# Patient Record
Sex: Male | Born: 1950 | Race: Black or African American | Hispanic: No | Smoking: Current every day smoker
Health system: Southern US, Community
[De-identification: ages and names within clinical notes are randomized; demographics above are authoritative.]

## PROBLEM LIST (undated history)

## (undated) DIAGNOSIS — K859 Acute pancreatitis without necrosis or infection, unspecified: Secondary | ICD-10-CM

## (undated) DIAGNOSIS — I10 Essential (primary) hypertension: Secondary | ICD-10-CM

---

## 2013-05-17 DIAGNOSIS — F17200 Nicotine dependence, unspecified, uncomplicated: Secondary | ICD-10-CM | POA: Insufficient documentation

## 2013-12-15 DIAGNOSIS — Z91199 Patient's noncompliance with other medical treatment and regimen due to unspecified reason: Secondary | ICD-10-CM | POA: Insufficient documentation

## 2013-12-15 DIAGNOSIS — I10 Essential (primary) hypertension: Secondary | ICD-10-CM | POA: Insufficient documentation

## 2014-03-09 DIAGNOSIS — S335XXA Sprain of ligaments of lumbar spine, initial encounter: Secondary | ICD-10-CM | POA: Insufficient documentation

## 2015-05-16 DIAGNOSIS — H1031 Unspecified acute conjunctivitis, right eye: Secondary | ICD-10-CM | POA: Insufficient documentation

## 2016-09-20 ENCOUNTER — Emergency Department (HOSPITAL_BASED_OUTPATIENT_CLINIC_OR_DEPARTMENT_OTHER): Payer: Managed Care, Other (non HMO)

## 2016-09-20 ENCOUNTER — Emergency Department (HOSPITAL_BASED_OUTPATIENT_CLINIC_OR_DEPARTMENT_OTHER)
Admission: EM | Admit: 2016-09-20 | Discharge: 2016-09-21 | Disposition: A | Discharge status: Home / Self Care | Payer: Managed Care, Other (non HMO) | Attending: Emergency Medicine | Admitting: Emergency Medicine

## 2016-09-20 ENCOUNTER — Encounter (HOSPITAL_BASED_OUTPATIENT_CLINIC_OR_DEPARTMENT_OTHER): Payer: Self-pay | Admitting: Emergency Medicine

## 2016-09-20 DIAGNOSIS — F17200 Nicotine dependence, unspecified, uncomplicated: Secondary | ICD-10-CM | POA: Diagnosis not present

## 2016-09-20 DIAGNOSIS — R51 Headache: Secondary | ICD-10-CM | POA: Diagnosis not present

## 2016-09-20 DIAGNOSIS — R0602 Shortness of breath: Secondary | ICD-10-CM | POA: Diagnosis present

## 2016-09-20 DIAGNOSIS — D649 Anemia, unspecified: Secondary | ICD-10-CM | POA: Insufficient documentation

## 2016-09-20 DIAGNOSIS — R195 Other fecal abnormalities: Secondary | ICD-10-CM

## 2016-09-20 DIAGNOSIS — I10 Essential (primary) hypertension: Secondary | ICD-10-CM | POA: Insufficient documentation

## 2016-09-20 DIAGNOSIS — R0902 Hypoxemia: Secondary | ICD-10-CM | POA: Insufficient documentation

## 2016-09-20 HISTORY — DX: Essential (primary) hypertension: I10

## 2016-09-20 NOTE — ED Notes (Signed)
Pt c/o generalized fatigue for past "some time" and that he sleeps "more than he should".  Pt c/o headache that gets worse with laying flat, and DOE for past many weeks as well.

## 2016-09-20 NOTE — ED Provider Notes (Signed)
[facility] Note   CSN: [id number] Arrival date & time: 09/20/16  2306   By signing my name below, I, Jared Duran, attest that this documentation has been prepared under the direction and in the presence of Jared Ohara, MD . Electronically Signed: Nelwyn Duran, Scribe. 09/20/2016. 11:49 PM.  History   Chief Complaint Chief Complaint  Patient presents with  . Hypertension  . Shortness of Breath   The history is provided by the patient. No language interpreter was used.   HPI Comments:  Jared Duran is a 66 y.o. male with pmhx of HTN who presents to the Emergency Department complaining of gradual-onset, intermittent headache onset over the past few weeks. Pt describes his symptoms as an aching headache exacerbated by laying down. No alleviating factors indicated. Pt reports associated cough and SOB. He has not taken anything at home for his symptoms. Pt denies any chest pain, abdominal pain, nausea, vomiting, diarrhea, LOC, slurred speech, vision loss, or weakness. He is a current smoker with no recent shx. Pt has no hx of blood clots.  Past Medical History:  Diagnosis Date  . Hypertension     There are no active problems to display for this patient.   History reviewed. No pertinent surgical history.    Home Medications    Prior to Admission medications   Medication Sig Start Date End Date Taking? Authorizing Provider  predniSONE (DELTASONE) 20 MG tablet 2 tabs po daily x 4 days 09/21/16   Jared Ohara, MD    Family History History reviewed. No pertinent family history.  Social History Social History  Substance Use Topics  . Smoking status: Current Every Day Smoker  . Smokeless tobacco: Never Used  . Alcohol use Yes     Allergies   Patient has no known allergies.   Review of Systems Review of Systems  Eyes: Negative for visual disturbance.  Respiratory: Positive for cough and shortness of breath.   Cardiovascular: Negative for  chest pain.  Gastrointestinal: Negative for abdominal pain, diarrhea, nausea and vomiting.  Neurological: Positive for headaches. Negative for syncope, speech difficulty and weakness.  All other systems reviewed and are negative.    Physical Exam Updated Vital Signs BP 137/84 (BP Location: Right Arm)   Pulse 80   Temp 98.4 F (36.9 C) (Oral)   Resp 20   Ht 5\' 9"  (1.753 m)   Wt 160 lb (72.6 kg)   SpO2 91%   BMI 23.63 kg/m   Physical Exam  Constitutional: He is oriented to person, place, and time. He appears well-developed and well-nourished.  HENT:  Head: Normocephalic.  Eyes: EOM are normal. Pupils are equal, round, and reactive to light.  Pupils equal bilaterally.   Neck: Normal range of motion.  Cardiovascular: Normal rate and regular rhythm.   Pulmonary/Chest: Effort normal and breath sounds normal. No respiratory distress.  Abdominal: Soft. He exhibits no distension. There is no tenderness.  Musculoskeletal: Normal range of motion.  Neurological: He is alert and oriented to person, place, and time.  No obvious facial droop. Finger-nose intact. Grossly 5+ strength in bilateral upper extremities. No temple tenderness bilaterally.  Psychiatric: He has a normal mood and affect.  Nursing note and vitals reviewed.    ED Treatments / Results  DIAGNOSTIC STUDIES:  Oxygen Saturation is 96% on [place], adequate by my interpretation.    Pt observed in room with Oxygen Saturation of 93% off oxygen with good waveform.   COORDINATION OF CARE:  12:28 AM Discussed  treatment plan with pt at bedside which includes oxygen, CT head, and blood work and pt agreed to plan.  Labs (all labs ordered are listed, but only abnormal results are displayed) Labs Reviewed  CBC - Abnormal; Notable for the following:       Result Value   RBC 2.53 (*)    Hemoglobin 9.1 (*)    HCT 26.9 (*)    MCV 106.3 (*)    MCH 36.0 (*)    Platelets 135 (*)    All other components within normal limits  BASIC  METABOLIC PANEL - Abnormal; Notable for the following:    Potassium 3.4 (*)    All other components within normal limits  OCCULT BLOOD X 1 CARD TO LAB, STOOL - Abnormal; Notable for the following:    Fecal Occult Bld POSITIVE (*)    All other components within normal limits  BRAIN NATRIURETIC PEPTIDE  D-DIMER, QUANTITATIVE (NOT AT [facility])  TROPONIN I    EKG  EKG Interpretation  Date/Time:  Sunday September 21 2016 00:41:30 EST Ventricular Rate:  70 PR Interval:    QRS Duration: 113 QT Interval:  419 QTC Calculation: 453 R Axis:   -64 Text Interpretation:  Sinus rhythm Left anterior fascicular block Anterior infarct, old Confirmed by Jared Laiche MD, Jared Duran [phone]([id number]) on 09/21/2016 1:06:48 AM       Radiology Dg Chest 2 View  Result Date: 09/20/2016 CLINICAL DATA:  Generalized fatigue and worsening headache for 2 weeks, cough, hypoxia, smoker, hypertension EXAM: CHEST  2 VIEW COMPARISON:  05/17/2013 FINDINGS: Normal heart size and pulmonary vascularity. Tortuous thoracic aorta. Hyperinflated lungs with minimal central peribronchial thickening consistent with COPD. No acute infiltrate, pleural effusion, or pneumothorax. Scattered endplate spur formation thoracic spine multiple levels. Old healed fracture of the posterior RIGHT tenth rib. IMPRESSION: COPD changes. No acute abnormalities. Electronically Signed   By: Jared SouthwardMark  Duran M.D.   On: 09/20/2016 23:58   Ct Head Wo Contrast  Result Date: 09/21/2016 CLINICAL DATA:  Intermittent headache for a few weeks. Cough and shortness of breath. History of hypertension. EXAM: CT HEAD WITHOUT CONTRAST TECHNIQUE: Contiguous axial images were obtained from the base of the skull through the vertex without intravenous contrast. COMPARISON:  None. FINDINGS: Brain: No evidence of acute infarction, hemorrhage, hydrocephalus, extra-axial collection or mass lesion/mass effect. Vascular: Vascular calcifications. Skull: Normal. Negative for fracture or focal lesion.  Sinuses/Orbits: Mild mucosal thickening and small retention cysts in the left maxillary antrum. Mastoid air cells are not opacified. Other: None. IMPRESSION: No acute intracranial abnormalities. Electronically Signed   By: Burman NievesWilliam  Stevens M.D.   On: 09/21/2016 01:14    Procedures Procedures (including critical care time)  Medications Ordered in ED Medications  morphine 4 MG/ML injection 4 mg (4 mg Intravenous Refused 09/21/16 0107)  acetaminophen (TYLENOL) tablet 1,000 mg (1,000 mg Oral Given 09/21/16 0056)  albuterol (PROVENTIL HFA;VENTOLIN HFA) 108 (90 Base) MCG/ACT inhaler 2 puff (2 puffs Inhalation Given 09/21/16 0106)  dexamethasone (DECADRON) injection 10 mg (10 mg Intravenous Given 09/21/16 0057)     Initial Impression / Assessment and Plan / ED Course  I have reviewed the triage vital signs and the nursing notes.  Pertinent labs & imaging results that were available during my care of the patient were reviewed by me and considered in my medical decision making (see chart for details).  Clinical Course    Patient presents with mild soreness of breath and cough for months. Long-term smoking history and chest x-ray along with clinical  concern for COPD undiagnosed. Discussed follow-up with primary Dr. for this. Patient's oxygen varying between 87 and 93%. On reassessment without oxygen patient 90%. Discussed he needs very close follow-up for this as patient refuses any admission or transfer. Patient has anemia unsure baseline. Patient has had polyps before. Stressed follow-up with gastroenterology for repeat colonoscopy and further workup. Patient understands he has a lot of medical issues going on right now and says a follow-up at [facility] in his specialist. Patient no chest pain. Low risk blood clot d-dimer negative. Intermittent gradual onset headaches CT scan unremarkable. No signs of temporal arteritis clinically. No fever or neck stiffness. Neuro intact.  Results and differential  diagnosis were discussed with the patient/parent/guardian. Xrays were independently reviewed by myself.  Close follow up outpatient was discussed, comfortable with the plan.   Medications  morphine 4 MG/ML injection 4 mg (4 mg Intravenous Refused 09/21/16 0107)  acetaminophen (TYLENOL) tablet 1,000 mg (1,000 mg Oral Given 09/21/16 0056)  albuterol (PROVENTIL HFA;VENTOLIN HFA) 108 (90 Base) MCG/ACT inhaler 2 puff (2 puffs Inhalation Given 09/21/16 0106)  dexamethasone (DECADRON) injection 10 mg (10 mg Intravenous Given 09/21/16 0057)    Vitals:   09/21/16 0045 09/21/16 0100 09/21/16 0106 09/21/16 0201  BP: (!) 148/102 145/98  137/84  Pulse: 71 75  80  Resp: 21 21  20   Temp:    98.4 F (36.9 C)  TempSrc:    Oral  SpO2: 97% 96% 96% 91%  Weight:      Height:        Final diagnoses:  Occult blood in stools  Hypoxia  Anemia, unspecified type  Shortness of breath     Final Clinical Impressions(s) / ED Diagnoses   Final diagnoses:  Occult blood in stools  Hypoxia  Anemia, unspecified type  Shortness of breath    New Prescriptions New Prescriptions   PREDNISONE (DELTASONE) 20 MG TABLET    2 tabs po daily x 4 days      Jared Ohara, MD 09/21/16 [phone]

## 2016-09-20 NOTE — ED Triage Notes (Signed)
Pt states he has been having HA for a few weeks and thought it was his blood pressure, never took at home

## 2016-09-21 ENCOUNTER — Emergency Department (HOSPITAL_BASED_OUTPATIENT_CLINIC_OR_DEPARTMENT_OTHER): Payer: Managed Care, Other (non HMO)

## 2016-09-21 LAB — BASIC METABOLIC PANEL
Anion gap: 10 (ref 5–15)
BUN: 8 mg/dL (ref 6–20)
CALCIUM: 9 mg/dL (ref 8.9–10.3)
CO2: 27 mmol/L (ref 22–32)
Chloride: 107 mmol/L (ref 101–111)
Creatinine, Ser: 1.07 mg/dL (ref 0.61–1.24)
GFR calc Af Amer: >60 mL/min (ref >60)
GFR calc non Af Amer: >60 mL/min (ref >60)
Glucose, Bld: 86 mg/dL (ref 65–99)
Potassium: 3.4 mmol/L — ABNORMAL LOW (ref 3.5–5.1)
Sodium: 144 mmol/L (ref 135–145)

## 2016-09-21 LAB — TROPONIN I: Troponin I: <0.03 ng/mL (ref <0.03)

## 2016-09-21 LAB — D-DIMER, QUANTITATIVE: D-Dimer, Quant: 0.34 ug/mL-FEU (ref 0.00–0.50)

## 2016-09-21 LAB — CBC
HEMATOCRIT: 26.9 % — AB (ref 39.0–52.0)
Hemoglobin: 9.1 g/dL — ABNORMAL LOW (ref 13.0–17.0)
MCH: 36 pg — AB (ref 26.0–34.0)
MCHC: 33.8 g/dL (ref 30.0–36.0)
MCV: 106.3 fL — AB (ref 78.0–100.0)
Platelets: 135 10*3/uL — ABNORMAL LOW (ref 150–400)
RBC: 2.53 MIL/uL — ABNORMAL LOW (ref 4.22–5.81)
RDW: 12.1 % (ref 11.5–15.5)
WBC: 7.6 10*3/uL (ref 4.0–10.5)

## 2016-09-21 LAB — BRAIN NATRIURETIC PEPTIDE: B Natriuretic Peptide: 17.9 pg/mL (ref 0.0–100.0)

## 2016-09-21 LAB — OCCULT BLOOD X 1 CARD TO LAB, STOOL: Fecal Occult Bld: POSITIVE — AB

## 2016-09-21 MED ORDER — ACETAMINOPHEN 500 MG PO TABS
1000.0000 mg | ORAL_TABLET | Freq: Once | ORAL | Status: AC
  Administered 2016-09-21: 1000 mg via ORAL
  Filled 2016-09-21: qty 2

## 2016-09-21 MED ORDER — ALBUTEROL SULFATE HFA 108 (90 BASE) MCG/ACT IN AERS
2.0000 | INHALATION_SPRAY | Freq: Once | RESPIRATORY_TRACT | Status: AC
  Administered 2016-09-21: 2 via RESPIRATORY_TRACT
  Filled 2016-09-21: qty 6.7

## 2016-09-21 MED ORDER — MORPHINE SULFATE (PF) 4 MG/ML IV SOLN
4.0000 mg | Freq: Once | INTRAVENOUS | Status: DC
  Filled 2016-09-21: qty 1

## 2016-09-21 MED ORDER — PREDNISONE 20 MG PO TABS: ORAL_TABLET | ORAL | 0 refills | Status: DC

## 2016-09-21 MED ORDER — DEXAMETHASONE SODIUM PHOSPHATE 10 MG/ML IJ SOLN
10.0000 mg | Freq: Once | INTRAMUSCULAR | Status: AC
  Administered 2016-09-21: 10 mg via INTRAVENOUS
  Filled 2016-09-21: qty 1

## 2016-09-21 NOTE — ED Notes (Signed)
Pt placed on 2L o2 due to low O2 sats.

## 2016-09-21 NOTE — ED Notes (Signed)
ED Provider at bedside. 

## 2016-09-21 NOTE — Discharge Instructions (Signed)
See [facility] for hypoxia and further workup.  Discuss repeat colonoscopy with your GI doctor.  If you were given medicines take as directed.  If you are on coumadin or contraceptives realize their levels and effectiveness is altered by many different medicines.  If you have any reaction (rash, tongues swelling, other) to the medicines stop taking and see a physician.    If your blood pressure was elevated in the ER make sure you follow up for management with a primary doctor or return for chest pain, shortness of breath or stroke symptoms.  Please follow up as directed and return to the ER or see a physician for new or worsening symptoms.  Thank you. Vitals:   09/21/16 0045 09/21/16 0100 09/21/16 0106 09/21/16 0201  BP: (!) 148/102 145/98  137/84  Pulse: 71 75  80  Resp: 21 21  20   Temp:    98.4 F (36.9 C)  TempSrc:    Oral  SpO2: 97% 96% 96% 91%  Weight:      Height:

## 2016-09-21 NOTE — ED Notes (Signed)
MD made aware that pt refused morphine admin.

## 2016-09-21 NOTE — ED Notes (Addendum)
Placed pt back on O2@2L  due sats at 89%. Pt sats are now 96% on O2@2L .

## 2016-09-21 NOTE — ED Notes (Signed)
Pt and SO given d/c instructions as per chart. Rx x 1. Verbalizes understanding. No questions. 

## 2017-05-18 DIAGNOSIS — I10 Essential (primary) hypertension: Secondary | ICD-10-CM | POA: Insufficient documentation

## 2017-06-24 DIAGNOSIS — R7989 Other specified abnormal findings of blood chemistry: Secondary | ICD-10-CM | POA: Insufficient documentation

## 2017-10-27 DIAGNOSIS — B0229 Other postherpetic nervous system involvement: Secondary | ICD-10-CM | POA: Insufficient documentation

## 2018-10-14 ENCOUNTER — Emergency Department (HOSPITAL_BASED_OUTPATIENT_CLINIC_OR_DEPARTMENT_OTHER)
Admission: EM | Admit: 2018-10-14 | Discharge: 2018-10-14 | Disposition: A | Discharge status: Home / Self Care | Payer: Managed Care, Other (non HMO) | Attending: Emergency Medicine | Admitting: Emergency Medicine

## 2018-10-14 ENCOUNTER — Other Ambulatory Visit: Payer: Self-pay

## 2018-10-14 ENCOUNTER — Emergency Department (HOSPITAL_BASED_OUTPATIENT_CLINIC_OR_DEPARTMENT_OTHER): Payer: Managed Care, Other (non HMO)

## 2018-10-14 ENCOUNTER — Encounter (HOSPITAL_BASED_OUTPATIENT_CLINIC_OR_DEPARTMENT_OTHER): Payer: Self-pay | Admitting: *Deleted

## 2018-10-14 DIAGNOSIS — R0902 Hypoxemia: Secondary | ICD-10-CM | POA: Diagnosis not present

## 2018-10-14 DIAGNOSIS — R7981 Abnormal blood-gas level: Secondary | ICD-10-CM

## 2018-10-14 DIAGNOSIS — F17200 Nicotine dependence, unspecified, uncomplicated: Secondary | ICD-10-CM | POA: Insufficient documentation

## 2018-10-14 DIAGNOSIS — R3121 Asymptomatic microscopic hematuria: Secondary | ICD-10-CM

## 2018-10-14 DIAGNOSIS — I10 Essential (primary) hypertension: Secondary | ICD-10-CM | POA: Diagnosis not present

## 2018-10-14 DIAGNOSIS — R112 Nausea with vomiting, unspecified: Secondary | ICD-10-CM

## 2018-10-14 LAB — CBC WITH DIFFERENTIAL/PLATELET
ABS IMMATURE GRANULOCYTES: 0.03 10*3/uL (ref 0.00–0.07)
BASOS PCT: 1 %
Basophils Absolute: 0 10*3/uL (ref 0.0–0.1)
EOS ABS: 0 10*3/uL (ref 0.0–0.5)
Eosinophils Relative: 0 %
HEMATOCRIT: 46.4 % (ref 39.0–52.0)
Hemoglobin: 15.6 g/dL (ref 13.0–17.0)
Immature Granulocytes: 0 %
LYMPHS ABS: 1.5 10*3/uL (ref 0.7–4.0)
Lymphocytes Relative: 18 %
MCH: 35 pg — AB (ref 26.0–34.0)
MCHC: 33.6 g/dL (ref 30.0–36.0)
MCV: 104 fL — AB (ref 80.0–100.0)
MONO ABS: 0.8 10*3/uL (ref 0.1–1.0)
MONOS PCT: 9 %
Neutro Abs: 6.2 10*3/uL (ref 1.7–7.7)
Neutrophils Relative %: 72 %
PLATELETS: 176 10*3/uL (ref 150–400)
RBC: 4.46 MIL/uL (ref 4.22–5.81)
RDW: 12.7 % (ref 11.5–15.5)
WBC: 8.6 10*3/uL (ref 4.0–10.5)
nRBC: 0 % (ref 0.0–0.2)

## 2018-10-14 LAB — URINALYSIS, ROUTINE W REFLEX MICROSCOPIC
Glucose, UA: NEGATIVE mg/dL
Leukocytes, UA: NEGATIVE
Nitrite: NEGATIVE
Specific Gravity, Urine: 1.025 (ref 1.005–1.030)
pH: 7 (ref 5.0–8.0)

## 2018-10-14 LAB — COMPREHENSIVE METABOLIC PANEL
ALT: 16 U/L (ref 0–44)
AST: 33 U/L (ref 15–41)
Albumin: 4 g/dL (ref 3.5–5.0)
Alkaline Phosphatase: 30 U/L — ABNORMAL LOW (ref 38–126)
Anion gap: 19 — ABNORMAL HIGH (ref 5–15)
BILIRUBIN TOTAL: 1.4 mg/dL — AB (ref 0.3–1.2)
BUN: 10 mg/dL (ref 8–23)
CALCIUM: 9.1 mg/dL (ref 8.9–10.3)
CHLORIDE: 87 mmol/L — AB (ref 98–111)
CO2: 29 mmol/L (ref 22–32)
CREATININE: 0.8 mg/dL (ref 0.61–1.24)
Glucose, Bld: 96 mg/dL (ref 70–99)
Potassium: 3.4 mmol/L — ABNORMAL LOW (ref 3.5–5.1)
Sodium: 135 mmol/L (ref 135–145)
TOTAL PROTEIN: 8 g/dL (ref 6.5–8.1)

## 2018-10-14 LAB — URINALYSIS, MICROSCOPIC (REFLEX): RBC / HPF: >50 RBC/hpf (ref 0–5)

## 2018-10-14 LAB — LIPASE, BLOOD: LIPASE: 33 U/L (ref 11–51)

## 2018-10-14 MED ORDER — LOSARTAN POTASSIUM 50 MG PO TABS: 50.0000 mg | ORAL_TABLET | Freq: Every day | ORAL | 0 refills | Status: DC

## 2018-10-14 MED ORDER — SODIUM CHLORIDE 0.9 % IV BOLUS
500.0000 mL | Freq: Once | INTRAVENOUS | Status: AC
  Administered 2018-10-14: 500 mL via INTRAVENOUS

## 2018-10-14 MED ORDER — ONDANSETRON 4 MG PO TBDP: 4.0000 mg | ORAL_TABLET | Freq: Three times a day (TID) | ORAL | 0 refills | Status: DC | PRN

## 2018-10-14 MED ORDER — IPRATROPIUM BROMIDE 0.02 % IN SOLN
0.5000 mg | Freq: Once | RESPIRATORY_TRACT | Status: AC
  Administered 2018-10-14: 0.5 mg via RESPIRATORY_TRACT
  Filled 2018-10-14: qty 2.5

## 2018-10-14 MED ORDER — NIFEDIPINE 20 MG PO CAPS: 20.0000 mg | ORAL_CAPSULE | Freq: Three times a day (TID) | ORAL | 0 refills | Status: DC

## 2018-10-14 MED ORDER — ALBUTEROL SULFATE (2.5 MG/3ML) 0.083% IN NEBU
5.0000 mg | INHALATION_SOLUTION | Freq: Once | RESPIRATORY_TRACT | Status: AC
  Administered 2018-10-14: 5 mg via RESPIRATORY_TRACT
  Filled 2018-10-14: qty 6

## 2018-10-14 MED ORDER — ONDANSETRON HCL 4 MG/2ML IJ SOLN
4.0000 mg | Freq: Once | INTRAMUSCULAR | Status: AC
Start: 2018-10-14 — End: 2018-10-14
  Administered 2018-10-14: 4 mg via INTRAVENOUS
  Filled 2018-10-14: qty 2

## 2018-10-14 MED ORDER — ALBUTEROL SULFATE HFA 108 (90 BASE) MCG/ACT IN AERS
2.0000 | INHALATION_SPRAY | RESPIRATORY_TRACT | Status: DC | PRN
  Administered 2018-10-14: 2 via RESPIRATORY_TRACT
  Filled 2018-10-14: qty 6.7

## 2018-10-14 NOTE — ED Notes (Signed)
RT to bedside

## 2018-10-14 NOTE — ED Provider Notes (Signed)
[facility] EMERGENCY DEPARTMENT Provider Note   CSN: [id number] Arrival date & time: 10/14/18  1155     History   Chief Complaint Chief Complaint  Patient presents with  . Emesis    HPI Jared Duran is a 68 y.o. male.  Patient with history of hypertension, chronic alcohol use, no past abdominal surgeries presents to the emergency department with 2 days of nausea and vomiting.  Patient has had some mild abdominal soreness but no focal tenderness.  No diarrhea.  No blood in his stool or vomit.  He states that he has shortness of breath and cough over the past several days as well.  He has had subjective fevers and chills.  No treatments prior to arrival.  Patient states that he has not drank alcohol in 2 days.  He states that he has been out of his blood pressure medications for about a month.  He called his doctor and was told to come to the emergency department.  Onset of symptoms acute.  Course is constant.  No known sick contacts. Patient denies risk factors for pulmonary embolism including: unilateral leg swelling, history of DVT/PE/other blood clots, use of exogenous hormones, recent immobilizations, recent surgery, recent travel (>4hr segment), malignancy, hemoptysis.       Past Medical History:  Diagnosis Date  . Hypertension     There are no active problems to display for this patient.   History reviewed. No pertinent surgical history.      Home Medications    Prior to Admission medications   Medication Sig Start Date End Date Taking? Authorizing Provider  predniSONE (DELTASONE) 20 MG tablet 2 tabs po daily x 4 days 09/21/16   Blane Ohara, MD    Family History No family history on file.  Social History Social History   Tobacco Use  . Smoking status: Current Every Day Smoker  . Smokeless tobacco: Never Used  Substance Use Topics  . Alcohol use: Yes  . Drug use: No     Allergies   Patient has no known allergies.   Review of  Systems Review of Systems  Constitutional: Positive for chills and fever.  HENT: Negative for rhinorrhea and sore throat.   Eyes: Negative for redness.  Respiratory: Positive for shortness of breath. Negative for cough.   Cardiovascular: Negative for chest pain.  Gastrointestinal: Positive for nausea and vomiting. Negative for abdominal pain and diarrhea.  Genitourinary: Negative for dysuria.  Musculoskeletal: Negative for myalgias.  Skin: Negative for rash.  Neurological: Negative for headaches.     Physical Exam Updated Vital Signs BP (!) 174/128 Comment: ran out of BP meds a month ago.  Pulse (!) 112   Temp 98.1 F (36.7 C) (Oral)   Resp 20   Ht 5\' 9"  (1.753 m)   Wt 68 kg   SpO2 94%   BMI 22.15 kg/m   Physical Exam Vitals signs and nursing note reviewed.  Constitutional:      Appearance: He is well-developed.  HENT:     Head: Normocephalic and atraumatic.  Eyes:     General:        Right eye: No discharge.        Left eye: No discharge.     Conjunctiva/sclera: Conjunctivae normal.  Neck:     Musculoskeletal: Normal range of motion and neck supple.  Cardiovascular:     Rate and Rhythm: Regular rhythm. Tachycardia present.     Heart sounds: Normal heart sounds. No murmur.  Pulmonary:  Effort: Pulmonary effort is normal. No respiratory distress.     Breath sounds: Normal breath sounds. No wheezing, rhonchi or rales.  Chest:     Chest wall: No tenderness.  Abdominal:     Palpations: Abdomen is soft.     Tenderness: There is no abdominal tenderness. There is no guarding or rebound.  Skin:    General: Skin is warm and dry.  Neurological:     Mental Status: He is alert.      ED Treatments / Results  Labs (all labs ordered are listed, but only abnormal results are displayed) Labs Reviewed  CBC WITH DIFFERENTIAL/PLATELET - Abnormal; Notable for the following components:      Result Value   MCV 104.0 (*)    MCH 35.0 (*)    All other components within  normal limits  COMPREHENSIVE METABOLIC PANEL - Abnormal; Notable for the following components:   Potassium 3.4 (*)    Chloride 87 (*)    Alkaline Phosphatase 30 (*)    Total Bilirubin 1.4 (*)    Anion gap 19 (*)    All other components within normal limits  URINALYSIS, ROUTINE W REFLEX MICROSCOPIC - Abnormal; Notable for the following components:   APPearance CLOUDY (*)    Hgb urine dipstick LARGE (*)    Bilirubin Urine SMALL (*)    Ketones, ur >80 (*)    Protein, ur >300 (*)    All other components within normal limits  URINALYSIS, MICROSCOPIC (REFLEX) - Abnormal; Notable for the following components:   Bacteria, UA RARE (*)    All other components within normal limits  LIPASE, BLOOD    EKG EKG Interpretation  Date/Time:  Thursday October 14 2018 12:30:41 EST Ventricular Rate:  94 PR Interval:    QRS Duration: 111 QT Interval:  380 QTC Calculation: 476 R Axis:   -69 Text Interpretation:  Sinus rhythm Left anterior fascicular block Anterior infarct, old No STEMI.  Confirmed by Alona BeneLong, Cheo Selvey [phone]([id number]) on 10/14/2018 12:34:26 PM   Radiology Dg Abd Acute 2+v W 1v Chest  Result Date: 10/14/2018 CLINICAL DATA:  Shortness of breath.  Nausea and vomiting. EXAM: DG ABDOMEN ACUTE W/ 1V CHEST COMPARISON:  Chest radiograph September 20, 2016; CT abdomen and pelvis February 17, 2017 FINDINGS: PA chest: No edema or consolidation. Heart size and pulmonary vascular normal. Aorta is tortuous but stable. No adenopathy. There is degenerative change in the thoracic spine. Supine and upright abdomen: There is moderate stool in the colon. There is no bowel dilatation or air-fluid level to suggest bowel obstruction. No free air. There are phleboliths in the pelvis. IMPRESSION: No evident bowel obstruction or free air. No lung edema or consolidation. Stable cardiac silhouette. Electronically Signed   By: Bretta BangWilliam  Woodruff III M.D.   On: 10/14/2018 12:27    Procedures Procedures (including critical care  time)  Medications Ordered in ED Medications  albuterol (PROVENTIL HFA;VENTOLIN HFA) 108 (90 Base) MCG/ACT inhaler 2 puff (2 puffs Inhalation Given 10/14/18 1410)  sodium chloride 0.9 % bolus 500 mL ( Intravenous Stopped 10/14/18 1331)  ondansetron (ZOFRAN) injection 4 mg (4 mg Intravenous Given 10/14/18 1256)  sodium chloride 0.9 % bolus 500 mL (0 mLs Intravenous Stopped 10/14/18 1407)  albuterol (PROVENTIL) (2.5 MG/3ML) 0.083% nebulizer solution 5 mg (5 mg Nebulization Given 10/14/18 1330)  ipratropium (ATROVENT) nebulizer solution 0.5 mg (0.5 mg Nebulization Given 10/14/18 1330)     Initial Impression / Assessment and Plan / ED Course  I have reviewed the triage  vital signs and the nursing notes.  Pertinent labs & imaging results that were available during my care of the patient were reviewed by me and considered in my medical decision making (see chart for details).     Patient seen and examined. Work-up initiated. Medications ordered.   Vital signs reviewed and are as follows: BP (!) 174/128 Comment: ran out of BP meds a month ago.  Pulse (!) 112   Temp 98.1 F (36.7 C) (Oral)   Resp 20   Ht 5\' 9"  (1.753 m)   Wt 68 kg   SpO2 94%   BMI 22.15 kg/m   1:15 PM Pt discussed with and seen by Dr. Jacqulyn Bath.   N/V are improving with treatment, given additional fluid bolus. Will PO trial. Abd remains benign.  Abdominal films without signs of obstruction.  Hematuria: noted incidentally, no infection.  Patient will need followed up for this.  CT in 2018 showed a small ?renal cyst.  Cough/shortness of breath: Patient has had low oxygen saturations at times here, he seems to be at his baseline and is stable.  States shortness of breath, however he continues to perform daily activities including go to work.  Will give breathing treatment here.  Will likely need continued treatment for COPD for home.  EKG without ischemic findings.  Doubt PE given patient presentation.  HTN: restart meds, will need PCP  follow-up.   2:11 PM Patient stable. I walked with him to the restroom. No distress or SOB.  On recheck, lungs are clear.   We discussed the above problems and need for close PCP follow-up.  Patient will be discharged home with prescription for nifedipine and losartan.  Also given Zofran.  Discussed brat diet and clear liquids for the next 24 hours. The patient was urged to return to the Emergency Department immediately with worsening of current symptoms, worsening abdominal pain, persistent vomiting, blood noted in stools, fever, or any other concerns. The patient verbalized understanding.   We discussed the above problems and need for close PCP follow-up. I have written these down and asked him to bring paperwork to PCP.   Discussed that if he develops worsening cough, shortness of breath, lightheadedness, chest pain, that he should return to the emergency department for further evaluation.  Stressed the importance of outpatient follow-up.  He is given a albuterol inhaler for home.  Discussed use of inhaler for wheezing, chest tightness, or cough.   Final Clinical Impressions(s) / ED Diagnoses   Final diagnoses:  Non-intractable vomiting with nausea, unspecified vomiting type  Essential hypertension  Asymptomatic microscopic hematuria  Low oxygen saturation   Patient with multiple chronic medical problems.  His main complaint today is nausea and vomiting without significant abdominal pain, diarrhea, or fever.  Patient with abdominal pain. Vitals are stable, no fever. Labs reassuring. Macrocytic anemia likely 2/2 Etoh use. Imaging without signs of pneumonia or bowel obstruction. No signs of dehydration, patient is tolerating PO's. Lungs are clear and no signs suggestive of PNA. Low concern for appendicitis, cholecystitis, pancreatitis, ruptured viscus, UTI, kidney stone, aortic dissection, aortic aneurysm or other emergent abdominal etiology. Supportive therapy indicated with return if symptoms  worsen.   Patient also found to have low oxygen saturation, microscopic hematuria, and elevated blood pressure today.  Treatment and plan as above.   ED Discharge Orders         Ordered    losartan (COZAAR) 50 MG tablet  Daily     10/14/18 1402  NIFEdipine (PROCARDIA) 20 MG capsule  3 times daily     10/14/18 1402    ondansetron (ZOFRAN ODT) 4 MG disintegrating tablet  Every 8 hours PRN     10/14/18 1406           Renne CriglerGeiple, Macklen Wilhoite, PA-C 10/14/18 1416    Maia PlanLong, Aritzel Krusemark G, MD 10/14/18 1425

## 2018-10-14 NOTE — ED Notes (Signed)
Pt returned from Xray and providing a urine sample. EKG will be completed after Pt is done with restroom.

## 2018-10-14 NOTE — ED Triage Notes (Signed)
Vomiting x 2 days. No pain.

## 2018-10-14 NOTE — ED Notes (Signed)
Pt given water for PO challenge 

## 2018-10-14 NOTE — ED Notes (Signed)
Patient transported to X-ray 

## 2018-10-14 NOTE — Discharge Instructions (Signed)
Please read and follow all provided instructions.  Your diagnoses today include:  1. Non-intractable vomiting with nausea, unspecified vomiting type   2. Essential hypertension   3. Asymptomatic microscopic hematuria   4. Low oxygen saturation     Tests performed today include:  Blood counts and electrolytes  Blood tests to check liver and kidney function  Blood tests to check pancreas function  Urine test to look for infection and pregnancy (in women)  Chest and abdominal x-ray - no pneumonia or signs of bowel blockage  EKG - no sign of heart attack  Vital signs. See below for your results today.   Medications prescribed:   Zofran (ondansetron) - for nausea and vomiting  Take any prescribed medications only as directed.  Home care instructions:   Follow any educational materials contained in this packet.  Follow-up instructions: Please follow-up with your primary care provider in the next 2 days for further evaluation of your symptoms.    You need to have the following problems addressed by your primary care doctor:   You have blood in your urine today. This needs to be rechecked and evaluated.   You have been restarted on your blood pressure medication today. You will need your blood pressure rechecked after re-starting your medication.   Your oxygen level is low -- likely related to COPD or smoking. This is probably a chronic problem but you need further evaluation for your oxygen level and breathing.   Return instructions:  SEEK IMMEDIATE MEDICAL ATTENTION IF:  The pain does not go away or becomes severe   A temperature above 101F develops   Repeated vomiting occurs (multiple episodes)   The pain becomes localized to portions of the abdomen. The right side could possibly be appendicitis. In an adult, the left lower portion of the abdomen could be colitis or diverticulitis.   Blood is being passed in stools or vomit (bright red or black tarry stools)    You develop chest pain, difficulty breathing, dizziness or fainting, or become confused, poorly responsive, or inconsolable (young children)  If you have any other emergent concerns regarding your health  Additional Information: Abdominal (belly) pain can be caused by many things. Your caregiver performed an examination and possibly ordered blood/urine tests and imaging (CT scan, x-rays, ultrasound). Many cases can be observed and treated at home after initial evaluation in the emergency department. Even though you are being discharged home, abdominal pain can be unpredictable. Therefore, you need a repeated exam if your pain does not resolve, returns, or worsens. Most patients with abdominal pain don't have to be admitted to the hospital or have surgery, but serious problems like appendicitis and gallbladder attacks can start out as nonspecific pain. Many abdominal conditions cannot be diagnosed in one visit, so follow-up evaluations are very important.  Your vital signs today were: BP (!) 188/115    Pulse 81    Temp 98.1 F (36.7 C) (Oral)    Resp (!) 24    Ht 5\' 9"  (1.753 m)    Wt 68 kg    SpO2 100%    BMI 22.15 kg/m  If your blood pressure (bp) was elevated above 135/85 this visit, please have this repeated by your doctor within one month. --------------

## 2018-10-14 NOTE — ED Notes (Signed)
ED Provider at bedside. 

## 2020-04-10 ENCOUNTER — Other Ambulatory Visit: Payer: Self-pay

## 2020-04-10 ENCOUNTER — Encounter (HOSPITAL_BASED_OUTPATIENT_CLINIC_OR_DEPARTMENT_OTHER): Payer: Self-pay

## 2020-04-10 ENCOUNTER — Emergency Department (HOSPITAL_BASED_OUTPATIENT_CLINIC_OR_DEPARTMENT_OTHER): Payer: Medicare Other

## 2020-04-10 ENCOUNTER — Inpatient Hospital Stay (HOSPITAL_BASED_OUTPATIENT_CLINIC_OR_DEPARTMENT_OTHER)
Admission: EM | Admit: 2020-04-10 | Discharge: 2020-04-12 | DRG: 439 | Disposition: A | Discharge status: Home / Self Care | Payer: Medicare Other | Attending: Internal Medicine | Admitting: Internal Medicine

## 2020-04-10 DIAGNOSIS — E860 Dehydration: Secondary | ICD-10-CM | POA: Diagnosis present

## 2020-04-10 DIAGNOSIS — I10 Essential (primary) hypertension: Secondary | ICD-10-CM | POA: Diagnosis present

## 2020-04-10 DIAGNOSIS — K852 Alcohol induced acute pancreatitis without necrosis or infection: Secondary | ICD-10-CM | POA: Diagnosis present

## 2020-04-10 DIAGNOSIS — K859 Acute pancreatitis without necrosis or infection, unspecified: Secondary | ICD-10-CM | POA: Diagnosis not present

## 2020-04-10 DIAGNOSIS — K760 Fatty (change of) liver, not elsewhere classified: Secondary | ICD-10-CM | POA: Diagnosis present

## 2020-04-10 DIAGNOSIS — K7030 Alcoholic cirrhosis of liver without ascites: Secondary | ICD-10-CM

## 2020-04-10 DIAGNOSIS — F1010 Alcohol abuse, uncomplicated: Secondary | ICD-10-CM | POA: Diagnosis present

## 2020-04-10 DIAGNOSIS — Z9119 Patient's noncompliance with other medical treatment and regimen: Secondary | ICD-10-CM | POA: Diagnosis not present

## 2020-04-10 DIAGNOSIS — K700 Alcoholic fatty liver: Secondary | ICD-10-CM

## 2020-04-10 DIAGNOSIS — Z7952 Long term (current) use of systemic steroids: Secondary | ICD-10-CM

## 2020-04-10 DIAGNOSIS — Z79899 Other long term (current) drug therapy: Secondary | ICD-10-CM | POA: Diagnosis not present

## 2020-04-10 DIAGNOSIS — F17210 Nicotine dependence, cigarettes, uncomplicated: Secondary | ICD-10-CM | POA: Diagnosis present

## 2020-04-10 DIAGNOSIS — Z20822 Contact with and (suspected) exposure to covid-19: Secondary | ICD-10-CM | POA: Diagnosis present

## 2020-04-10 DIAGNOSIS — E871 Hypo-osmolality and hyponatremia: Secondary | ICD-10-CM | POA: Diagnosis present

## 2020-04-10 LAB — BASIC METABOLIC PANEL
Anion gap: 21 — ABNORMAL HIGH (ref 5–15)
BUN: 14 mg/dL (ref 8–23)
CO2: 26 mmol/L (ref 22–32)
Calcium: 9.8 mg/dL (ref 8.9–10.3)
Chloride: 87 mmol/L — ABNORMAL LOW (ref 98–111)
Creatinine, Ser: 1.09 mg/dL (ref 0.61–1.24)
GFR calc Af Amer: >60 mL/min (ref >60)
GFR calc non Af Amer: >60 mL/min (ref >60)
Glucose, Bld: 125 mg/dL — ABNORMAL HIGH (ref 70–99)
Potassium: 3.9 mmol/L (ref 3.5–5.1)
Sodium: 134 mmol/L — ABNORMAL LOW (ref 135–145)

## 2020-04-10 LAB — CBC
HCT: 51.1 % (ref 39.0–52.0)
Hemoglobin: 17.9 g/dL — ABNORMAL HIGH (ref 13.0–17.0)
MCH: 36.1 pg — ABNORMAL HIGH (ref 26.0–34.0)
MCHC: 35 g/dL (ref 30.0–36.0)
MCV: 103 fL — ABNORMAL HIGH (ref 80.0–100.0)
Platelets: 214 10*3/uL (ref 150–400)
RBC: 4.96 MIL/uL (ref 4.22–5.81)
RDW: 13.9 % (ref 11.5–15.5)
WBC: 7.5 10*3/uL (ref 4.0–10.5)
nRBC: 0.3 % — ABNORMAL HIGH (ref 0.0–0.2)

## 2020-04-10 LAB — HEPATIC FUNCTION PANEL
ALT: 20 U/L (ref 0–44)
AST: 35 U/L (ref 15–41)
Albumin: 4.4 g/dL (ref 3.5–5.0)
Alkaline Phosphatase: 38 U/L (ref 38–126)
Bilirubin, Direct: 0.2 mg/dL (ref 0.0–0.2)
Indirect Bilirubin: 1.2 mg/dL — ABNORMAL HIGH (ref 0.3–0.9)
Total Bilirubin: 1.4 mg/dL — ABNORMAL HIGH (ref 0.3–1.2)
Total Protein: 9.1 g/dL — ABNORMAL HIGH (ref 6.5–8.1)

## 2020-04-10 LAB — TROPONIN I (HIGH SENSITIVITY): Troponin I (High Sensitivity): 12 ng/L (ref <18)

## 2020-04-10 LAB — LIPASE, BLOOD: Lipase: 1638 U/L — ABNORMAL HIGH (ref 11–51)

## 2020-04-10 LAB — SARS CORONAVIRUS 2 BY RT PCR (HOSPITAL ORDER, PERFORMED IN ~~LOC~~ HOSPITAL LAB): SARS Coronavirus 2: NEGATIVE

## 2020-04-10 MED ORDER — PNEUMOCOCCAL VAC POLYVALENT 25 MCG/0.5ML IJ INJ
0.5000 mL | INJECTION | INTRAMUSCULAR | Status: DC
  Filled 2020-04-10: qty 0.5

## 2020-04-10 MED ORDER — PANTOPRAZOLE SODIUM 40 MG IV SOLR
40.0000 mg | Freq: Once | INTRAVENOUS | Status: AC
  Administered 2020-04-10: 40 mg via INTRAVENOUS
  Filled 2020-04-10: qty 40

## 2020-04-10 MED ORDER — THIAMINE HCL 100 MG/ML IJ SOLN: 100.0000 mg | Freq: Every day | INTRAMUSCULAR | Status: DC

## 2020-04-10 MED ORDER — FOLIC ACID 1 MG PO TABS
1.0000 mg | ORAL_TABLET | Freq: Every day | ORAL | Status: DC
  Administered 2020-04-10 – 2020-04-12 (×3): 1 mg via ORAL
  Filled 2020-04-10 (×3): qty 1

## 2020-04-10 MED ORDER — ONDANSETRON HCL 4 MG/2ML IJ SOLN: 4.0000 mg | Freq: Four times a day (QID) | INTRAMUSCULAR | Status: DC | PRN

## 2020-04-10 MED ORDER — MORPHINE SULFATE (PF) 4 MG/ML IV SOLN
4.0000 mg | INTRAVENOUS | Status: DC | PRN
  Administered 2020-04-10: 4 mg via INTRAVENOUS
  Filled 2020-04-10: qty 1

## 2020-04-10 MED ORDER — LORAZEPAM 1 MG PO TABS
1.0000 mg | ORAL_TABLET | ORAL | Status: DC | PRN
  Administered 2020-04-12: 05:00:00 1 mg via ORAL
  Filled 2020-04-10: qty 1

## 2020-04-10 MED ORDER — SODIUM CHLORIDE 0.9 % IV BOLUS
1000.0000 mL | Freq: Once | INTRAVENOUS | Status: AC
  Administered 2020-04-10: 1000 mL via INTRAVENOUS

## 2020-04-10 MED ORDER — NIFEDIPINE ER OSMOTIC RELEASE 30 MG PO TB24
30.0000 mg | ORAL_TABLET | Freq: Every day | ORAL | Status: DC
  Administered 2020-04-10 – 2020-04-11 (×2): 30 mg via ORAL
  Filled 2020-04-10 (×2): qty 1

## 2020-04-10 MED ORDER — LACTATED RINGERS IV SOLN: INTRAVENOUS | Status: DC

## 2020-04-10 MED ORDER — MORPHINE SULFATE (PF) 2 MG/ML IV SOLN
2.0000 mg | INTRAVENOUS | Status: DC | PRN
  Administered 2020-04-11: 14:00:00 2 mg via INTRAVENOUS
  Filled 2020-04-10: qty 1

## 2020-04-10 MED ORDER — OXYCODONE HCL 5 MG PO TABS
5.0000 mg | ORAL_TABLET | ORAL | Status: DC | PRN
  Administered 2020-04-11 – 2020-04-12 (×2): 5 mg via ORAL
  Filled 2020-04-10 (×2): qty 1

## 2020-04-10 MED ORDER — LACTATED RINGERS IV BOLUS
1000.0000 mL | Freq: Once | INTRAVENOUS | Status: AC
  Administered 2020-04-10: 1000 mL via INTRAVENOUS

## 2020-04-10 MED ORDER — PANTOPRAZOLE SODIUM 40 MG IV SOLR
40.0000 mg | INTRAVENOUS | Status: DC
  Administered 2020-04-11: 09:00:00 40 mg via INTRAVENOUS
  Filled 2020-04-10: qty 40

## 2020-04-10 MED ORDER — LORAZEPAM 2 MG/ML IJ SOLN: 1.0000 mg | INTRAMUSCULAR | Status: DC | PRN

## 2020-04-10 MED ORDER — IOHEXOL 300 MG/ML  SOLN
100.0000 mL | Freq: Once | INTRAMUSCULAR | Status: AC | PRN
  Administered 2020-04-10: 100 mL via INTRAVENOUS

## 2020-04-10 MED ORDER — ONDANSETRON HCL 4 MG/2ML IJ SOLN
4.0000 mg | Freq: Once | INTRAMUSCULAR | Status: AC
  Administered 2020-04-10: 4 mg via INTRAVENOUS
  Filled 2020-04-10: qty 2

## 2020-04-10 MED ORDER — ONDANSETRON HCL 4 MG PO TABS: 4.0000 mg | ORAL_TABLET | Freq: Four times a day (QID) | ORAL | Status: DC | PRN

## 2020-04-10 MED ORDER — MORPHINE SULFATE (PF) 4 MG/ML IV SOLN
4.0000 mg | Freq: Once | INTRAVENOUS | Status: AC
  Administered 2020-04-10: 4 mg via INTRAVENOUS
  Filled 2020-04-10: qty 1

## 2020-04-10 MED ORDER — THIAMINE HCL 100 MG PO TABS
100.0000 mg | ORAL_TABLET | Freq: Every day | ORAL | Status: DC
  Administered 2020-04-10 – 2020-04-12 (×3): 100 mg via ORAL
  Filled 2020-04-10 (×3): qty 1

## 2020-04-10 MED ORDER — ADULT MULTIVITAMIN W/MINERALS CH
1.0000 | ORAL_TABLET | Freq: Every day | ORAL | Status: DC
  Administered 2020-04-10 – 2020-04-12 (×3): 1 via ORAL
  Filled 2020-04-10 (×3): qty 1

## 2020-04-10 MED ORDER — ENOXAPARIN SODIUM 40 MG/0.4ML ~~LOC~~ SOLN
40.0000 mg | SUBCUTANEOUS | Status: DC
  Administered 2020-04-10 – 2020-04-11 (×2): 40 mg via SUBCUTANEOUS
  Filled 2020-04-10 (×2): qty 0.4

## 2020-04-10 NOTE — H&P (Signed)
History and Physical    Jared Duran QJJ:[id number] DOB: [birth year] DOA: 04/10/2020  PCP: Brayton El, PA-C   Patient coming from: home- [facility]  Chief Complaint  Patient presents with  . Abdominal Pain  . Chest Pain      HPI: Jared Duran is a 69 y.o. male with medical history of HTN not on meds, etoh abuse coming in with initially chest pain 3 days ago but 1 day later pain moved to epigastric area along with intractable nausea vomiting. He had similar abdominal pain in the past but resolved on it's own, and he did not seek medical attention at that time. Patient currently denies any , chest pain, shortness of breath, fever, chills, headache, focal weakness, numbness tingling, speech difficulties ED Course: Blood pressure in 160s to 170s.  saturating well on room air. Labs Lipase 1638, lfts okay, slightly up bil, CT abd- acute pancreatitis, given his alcohol use- likley etoh induced. HR bette w/ ivf, received morphine.  Patient was transferred to [facility] for further admission.  medsurg.  He has received morphine and pain is controlled.  He is relaxing and watching TV.  Review of Systems: All systems were reviewed and were negative except as mentioned in HPI above. Negative for fever Negative for chest pain Negative for shortness of breath  Past Medical History:  Diagnosis Date  . Hypertension     History reviewed. No pertinent surgical history.   reports that he has been smoking cigarettes. He has been smoking about 1.00 pack per day. He has never used smokeless tobacco. He reports current alcohol use. He reports that he does not use drugs.  No Known Allergies  No family history on file.   Prior to Admission medications   Medication Sig Start Date End Date Taking? Authorizing Provider  losartan (COZAAR) 100 MG tablet Take 100 mg by mouth daily. 02/03/20   [provider]  NIFEdipine (PROCARDIA) 20 MG capsule Take 1 capsule (20 mg total) by mouth  3 (three) times daily. 10/14/18   Renne Crigler, PA-C  ondansetron (ZOFRAN ODT) 4 MG disintegrating tablet Take 1 tablet (4 mg total) by mouth every 8 (eight) hours as needed for nausea or vomiting. 10/14/18   Renne Crigler, PA-C  pantoprazole (PROTONIX) 40 MG tablet Take 40 mg by mouth daily. 02/03/20   [provider]  predniSONE (DELTASONE) 20 MG tablet 2 tabs po daily x 4 days 09/21/16   Blane Ohara, MD    Physical Exam: Vitals:   04/10/20 1351 04/10/20 1400 04/10/20 1440 04/10/20 1552  BP: (!) 164/123 (!) 164/114 (!) 171/121 (!) 164/125  Pulse: (!) 106 93 90 96  Resp: 19 19 (!) 22 20  Temp:    98.2 F (36.8 C)  TempSrc:    Oral  SpO2: 97% 95% 93% 96%  Weight:      Height:        General exam: AAOx3, NAD, weak appearing. HEENT:Oral mucosa moist, Ear/Nose WNL grossly, dentition normal. Respiratory system: bilaterally clear,no wheezing or crackles,no use of accessory muscle Cardiovascular system: S1 & S2 +, No JVD,. Gastrointestinal system: Abdomen soft, tender in mid abdomen,ND, BS+ Nervous System:Alert, awake, moving extremities and grossly nonfocal Extremities: No edema, distal peripheral pulses palpable.  Skin: No rashes,no icterus. MSK: Normal muscle bulk,tone, power   Labs on Admission: I have personally reviewed following labs and imaging studies  CBC: Recent Labs  Lab 04/10/20 1132  WBC 7.5  HGB 17.9*  HCT 51.1  MCV 103.0*  PLT 214   Basic Metabolic Panel: Recent Labs  Lab 04/10/20 1132  NA 134*  K 3.9  CL 87*  CO2 26  GLUCOSE 125*  BUN 14  CREATININE 1.09  CALCIUM 9.8   GFR: Estimated Creatinine Clearance: 59.9 mL/min (by C-G formula based on SCr of 1.09 mg/dL). Liver Function Tests: Recent Labs  Lab 04/10/20 1132  AST 35  ALT 20  ALKPHOS 38  BILITOT 1.4*  PROT 9.1*  ALBUMIN 4.4   Recent Labs  Lab 04/10/20 1132  LIPASE 1,638*   No results for input(s): AMMONIA in the last 168 hours. Coagulation Profile: No results for  input(s): INR, PROTIME in the last 168 hours. Cardiac Enzymes: No results for input(s): CKTOTAL, CKMB, CKMBINDEX, TROPONINI in the last 168 hours. BNP (last 3 results) No results for input(s): PROBNP in the last 8760 hours. HbA1C: No results for input(s): HGBA1C in the last 72 hours. CBG: No results for input(s): GLUCAP in the last 168 hours. Lipid Profile: No results for input(s): CHOL, HDL, LDLCALC, TRIG, CHOLHDL, LDLDIRECT in the last 72 hours. Thyroid Function Tests: No results for input(s): TSH, T4TOTAL, FREET4, T3FREE, THYROIDAB in the last 72 hours. Anemia Panel: No results for input(s): VITAMINB12, FOLATE, FERRITIN, TIBC, IRON, RETICCTPCT in the last 72 hours. Urine analysis:    Component Value Date/Time   COLORURINE YELLOW 10/14/2018 1217   APPEARANCEUR CLOUDY (A) 10/14/2018 1217   LABSPEC 1.025 10/14/2018 1217   PHURINE 7.0 10/14/2018 1217   GLUCOSEU NEGATIVE 10/14/2018 1217   HGBUR LARGE (A) 10/14/2018 1217   BILIRUBINUR SMALL (A) 10/14/2018 1217   KETONESUR >80 (A) 10/14/2018 1217   PROTEINUR >300 (A) 10/14/2018 1217   NITRITE NEGATIVE 10/14/2018 1217   LEUKOCYTESUR NEGATIVE 10/14/2018 1217    Radiological Exams on Admission: DG Chest 2 View  Result Date: 04/10/2020 CLINICAL DATA:  Chest pain abdominal pain 3 days EXAM: CHEST - 2 VIEW COMPARISON:  05/12/2019 FINDINGS: The heart size and mediastinal contours are within normal limits. Both lungs are clear. The visualized skeletal structures are unremarkable. IMPRESSION: No active cardiopulmonary disease. Electronically Signed   By: Marlan Palau M.D.   On: 04/10/2020 12:52   CT ABDOMEN PELVIS W CONTRAST  Result Date: 04/10/2020 CLINICAL DATA:  Generalized abdominal pain, most severe in the epigastric region with guarding and vomiting. EXAM: CT ABDOMEN AND PELVIS WITH CONTRAST TECHNIQUE: Multidetector CT imaging of the abdomen and pelvis was performed using the standard protocol following bolus administration of  intravenous contrast. CONTRAST:  OMNIPAQUE IOHEXOL 300 MG/ML  SOLN COMPARISON:  11/09/2019 FINDINGS: Lower chest: The visualized lung bases are clear. Hepatobiliary: Diffusely decreased attenuation of the liver consistent with steatosis. 2 subcentimeter hypodensities in the right hepatic lobe, unchanged and too small to fully characterize. Unremarkable gallbladder. No biliary dilatation. Pancreas: Extensive diffuse peripancreatic inflammation. No evidence of pancreatic necrosis, ductal dilatation, or fluid collection. Spleen: Unremarkable. Adrenals/Urinary Tract: Unremarkable adrenal glands. 1.7 cm right renal cyst. Subcentimeter hypodensities in both kidneys, too small to fully characterize. No renal calculi or hydronephrosis. Unremarkable bladder. Stomach/Bowel: The stomach is unremarkable. There is no evidence of bowel obstruction. Left-sided colonic diverticulosis is noted without evidence of acute diverticulitis. Vascular/Lymphatic: Abdominal aortic atherosclerosis without aneurysm. No enlarged lymph nodes. Reproductive: Unremarkable prostate. Other: Small volume ascites.  No pneumoperitoneum. Musculoskeletal: No acute osseous abnormality or suspicious osseous lesion. IMPRESSION: 1. Acute pancreatitis. No evidence of pancreatic necrosis or fluid collection. 2. Hepatic steatosis. 3. Aortic Atherosclerosis (ICD10-I70.0). Electronically Signed   By: Sebastian Ache  M.D.   On: 04/10/2020 12:59     Assessment/Plan  Acute pancreatitis: Pending EtOH induced.  Check his TG level lipid profile, cmp in am.Continue on aggressive afterload hydration with Ringer's lactate.  He received 3 to bolus already.  Continue pain control with IV morphine, clear liquid diet- he is requesting to eat.  PPI and supportive measures and monitor closely  Hemoconcentration with elevated hemoglobin likely from dehydration.  Next hyponatremia.  Continue aggressive IV fluids as above.  Elevated total bili 1.5 monitor LFTs likely  from alcohol abuse.  Ketonuria likely from his alcohol abuse poor intake pancreatitis dehydration.  Will be hydrated aggressively.  Alcohol abuse: cessation advised, we will order thiamine folate and ciwa protocol  Uncontrolled hypertension likely from acute pancreatitis pain related and noncompliance.  He used to be on  Losartan 100 mg and Nifedipine 20 tid. We will start him on 30 Procardia XL and uptitrate slowly  Body mass index is 20.67 kg/m.   Severity of Illness:  * I certify that at the point of admission it is my clinical judgment that the patient will require inpatient hospital care spanning beyond 2 midnights from the point of admission due to high intensity of service, high risk for further deterioration and high frequency of surveillance required.*    DVT prophylaxis:enoxaparin (LOVENOX) injection 40 mg Start: 04/10/20 1800 Code Status:   Code Status: Full Code  Family Communication: Admission, patients condition and plan of care including tests being ordered have been discussed with the patient  who indicate understanding and agree with the plan and Code Status.  Consults called:  None  Lanae Boast MD [facility]  If 7PM-7AM, please contact night-coverage [email or web address]  04/10/2020, 5:04 PM

## 2020-04-10 NOTE — ED Triage Notes (Signed)
Pt arrives with c/o abdominal pain and chest pain X3 days.

## 2020-04-10 NOTE — Progress Notes (Signed)
Dr Early Chars notified of patient blood pressure of 164/125

## 2020-04-10 NOTE — ED Provider Notes (Signed)
[facility] EMERGENCY DEPARTMENT Provider Note   CSN: [id number] Arrival date & time: 04/10/20  1031     History Chief Complaint  Patient presents with  . Abdominal Pain  . Chest Pain    Princess PernaWilliam Geron is a 69 y.o. male.  Princess PernaWilliam Branscome is a 69 y.o. male with a history of hypertension, who presents to the emergency department for evaluation of chest and abdominal pain.  Patient reports patient started in the center of his lower chest and was present for about a day and then moved down to the epigastric region of his abdomen and has been a constant severe pain for the past 3 days.  Pain is associated with severe nausea and vomiting, patient states he has not been able to keep down anything, and even not even water for the past few days and anytime he tries to eat or drink anything he vomits.  He has not noted any blood in his emesis.  He has been having normal bowel movements, denies melena or hematochezia.  No fevers or chills.  States that since chest pain moved out of the abdomen he has not had any additional chest pain, no shortness of breath.  He states he gets cold sweats when he has to vomit.  He denies any urinary symptoms.  States this is happened once before about a month and a half ago and persisted for 3 to 4 days, but then went away on its own, he did not seek medical evaluation at this time.  Does report that he typically drinks 3 beers daily, last drink on Friday evening before symptoms began.        Past Medical History:  Diagnosis Date  . Hypertension     There are no problems to display for this patient.   History reviewed. No pertinent surgical history.     No family history on file.  Social History   Tobacco Use  . Smoking status: Current Every Day Smoker    Packs/day: 1.00    Types: Cigarettes  . Smokeless tobacco: Never Used  Substance Use Topics  . Alcohol use: Yes  . Drug use: No    Home Medications Prior to Admission  medications   Medication Sig Start Date End Date Taking? Authorizing Provider  losartan (COZAAR) 50 MG tablet Take 1 tablet (50 mg total) by mouth daily. 10/14/18   Renne CriglerGeiple, Joshua, PA-C  NIFEdipine (PROCARDIA) 20 MG capsule Take 1 capsule (20 mg total) by mouth 3 (three) times daily. 10/14/18   Renne CriglerGeiple, Joshua, PA-C  ondansetron (ZOFRAN ODT) 4 MG disintegrating tablet Take 1 tablet (4 mg total) by mouth every 8 (eight) hours as needed for nausea or vomiting. 10/14/18   Renne CriglerGeiple, Joshua, PA-C  predniSONE (DELTASONE) 20 MG tablet 2 tabs po daily x 4 days 09/21/16   Blane OharaZavitz, Joshua, MD    Allergies    Patient has no known allergies.  Review of Systems   Review of Systems  Constitutional: Negative for chills and fever.  HENT: Negative.   Respiratory: Negative for cough and shortness of breath.   Cardiovascular: Positive for chest pain.  Gastrointestinal: Positive for abdominal pain, nausea and vomiting. Negative for blood in stool, constipation and diarrhea.  Genitourinary: Negative for dysuria and frequency.  Musculoskeletal: Negative for arthralgias and myalgias.  Skin: Negative for color change and rash.  Neurological: Negative for dizziness, syncope and light-headedness.    Physical Exam Updated Vital Signs BP (!) 143/110 (BP Location: Right Arm)   Pulse Marland Kitchen(!)  129   Temp 97.9 F (36.6 C) (Oral)   Resp 18   Ht 5\' 9"  (1.753 m)   Wt 63.5 kg   SpO2 100%   BMI 20.67 kg/m   Physical Exam Vitals and nursing note reviewed.  Constitutional:      General: He is not in acute distress.    Appearance: He is well-developed. He is ill-appearing and diaphoretic.     Comments: Ill-appearing, diaphoretic and actively vomiting  HENT:     Head: Normocephalic and atraumatic.  Eyes:     General:        Right eye: No discharge.        Left eye: No discharge.     Pupils: Pupils are equal, round, and reactive to light.  Cardiovascular:     Rate and Rhythm: Regular rhythm. Tachycardia present.      Pulses:          Radial pulses are 2+ on the right side and 2+ on the left side.     Heart sounds: Normal heart sounds.     Comments: Tachycardia with regular rhythm Pulmonary:     Effort: Pulmonary effort is normal. No respiratory distress.     Breath sounds: Normal breath sounds. No wheezing or rales.  Abdominal:     General: Bowel sounds are normal. There is no distension.     Palpations: Abdomen is soft. There is no mass.     Tenderness: There is no abdominal tenderness. There is no guarding.  Musculoskeletal:        General: No deformity.     Cervical back: Neck supple.  Skin:    General: Skin is warm.     Capillary Refill: Capillary refill takes less than 2 seconds.  Neurological:     Mental Status: He is alert.     Coordination: Coordination normal.     Comments: Speech is clear, able to follow commands Moves extremities without ataxia, coordination intact  Psychiatric:        Mood and Affect: Mood normal.        Behavior: Behavior normal.     ED Results / Procedures / Treatments   Labs (all labs ordered are listed, but only abnormal results are displayed) Labs Reviewed  BASIC METABOLIC PANEL - Abnormal; Notable for the following components:      Result Value   Sodium 134 (*)    Chloride 87 (*)    Glucose, Bld 125 (*)    Anion gap 21 (*)    All other components within normal limits  CBC - Abnormal; Notable for the following components:   Hemoglobin 17.9 (*)    MCV 103.0 (*)    MCH 36.1 (*)    nRBC 0.3 (*)    All other components within normal limits  HEPATIC FUNCTION PANEL - Abnormal; Notable for the following components:   Total Protein 9.1 (*)    Total Bilirubin 1.4 (*)    Indirect Bilirubin 1.2 (*)    All other components within normal limits  LIPASE, BLOOD - Abnormal; Notable for the following components:   Lipase 1,638 (*)    All other components within normal limits  SARS CORONAVIRUS 2 BY RT PCR [facility] ORDER, PERFORMED IN [facility] LAB)    TROPONIN I (HIGH SENSITIVITY)    EKG EKG Interpretation  Date/Time:  Tuesday April 10 2020 11:25:46 EDT Ventricular Rate:  124 PR Interval:  148 QRS Duration: 92 QT Interval:  326 QTC Calculation: 468 R Axis:   -  80 Text Interpretation: Sinus tachycardia Biatrial enlargement Left axis deviation Incomplete right bundle branch block Minimal voltage criteria for LVH, may be normal variant ( Cornell product ) Nonspecific ST and T wave abnormality Abnormal ECG Since prior ECG, rate has increased Confirmed by Alvira Monday ([id number]) on 04/10/2020 11:36:01 AM   Radiology DG Chest 2 View  Result Date: 04/10/2020 CLINICAL DATA:  Chest pain abdominal pain 3 days EXAM: CHEST - 2 VIEW COMPARISON:  05/12/2019 FINDINGS: The heart size and mediastinal contours are within normal limits. Both lungs are clear. The visualized skeletal structures are unremarkable. IMPRESSION: No active cardiopulmonary disease. Electronically Signed   By: Marlan Palau M.D.   On: 04/10/2020 12:52   CT ABDOMEN PELVIS W CONTRAST  Result Date: 04/10/2020 CLINICAL DATA:  Generalized abdominal pain, most severe in the epigastric region with guarding and vomiting. EXAM: CT ABDOMEN AND PELVIS WITH CONTRAST TECHNIQUE: Multidetector CT imaging of the abdomen and pelvis was performed using the standard protocol following bolus administration of intravenous contrast. CONTRAST:  OMNIPAQUE IOHEXOL 300 MG/ML  SOLN COMPARISON:  11/09/2019 FINDINGS: Lower chest: The visualized lung bases are clear. Hepatobiliary: Diffusely decreased attenuation of the liver consistent with steatosis. 2 subcentimeter hypodensities in the right hepatic lobe, unchanged and too small to fully characterize. Unremarkable gallbladder. No biliary dilatation. Pancreas: Extensive diffuse peripancreatic inflammation. No evidence of pancreatic necrosis, ductal dilatation, or fluid collection. Spleen: Unremarkable. Adrenals/Urinary Tract: Unremarkable adrenal glands.  1.7 cm right renal cyst. Subcentimeter hypodensities in both kidneys, too small to fully characterize. No renal calculi or hydronephrosis. Unremarkable bladder. Stomach/Bowel: The stomach is unremarkable. There is no evidence of bowel obstruction. Left-sided colonic diverticulosis is noted without evidence of acute diverticulitis. Vascular/Lymphatic: Abdominal aortic atherosclerosis without aneurysm. No enlarged lymph nodes. Reproductive: Unremarkable prostate. Other: Small volume ascites.  No pneumoperitoneum. Musculoskeletal: No acute osseous abnormality or suspicious osseous lesion. IMPRESSION: 1. Acute pancreatitis. No evidence of pancreatic necrosis or fluid collection. 2. Hepatic steatosis. 3. Aortic Atherosclerosis (ICD10-I70.0). Electronically Signed   By: Sebastian Ache M.D.   On: 04/10/2020 12:59    Procedures Procedures (including critical care time)  Medications Ordered in ED Medications  morphine 4 MG/ML injection 4 mg (has no administration in time range)  sodium chloride 0.9 % bolus 1,000 mL (0 mLs Intravenous Stopped 04/10/20 1334)  ondansetron (ZOFRAN) injection 4 mg (4 mg Intravenous Given 04/10/20 1206)  pantoprazole (PROTONIX) injection 40 mg (40 mg Intravenous Given 04/10/20 1209)  morphine 4 MG/ML injection 4 mg (4 mg Intravenous Given 04/10/20 1211)  iohexol (OMNIPAQUE) 300 MG/ML solution 100 mL (100 mLs Intravenous Contrast Given 04/10/20 1229)  sodium chloride 0.9 % bolus 1,000 mL (1,000 mLs Intravenous New Bag/Given 04/10/20 1334)    ED Course  I have reviewed the triage vital signs and the nursing notes.  Pertinent labs & imaging results that were available during my care of the patient were reviewed by me and considered in my medical decision making (see chart for details).    MDM Rules/Calculators/A&P                         69 year old male arrives with pain that started in his chest 3 days ago and then moved to his upper abdomen and has been constant and associated with  persistent nausea and vomiting.  On arrival patient is tachycardic, ill-appearing, actively vomiting with some diaphoresis.  Since pain moved his abdomen he has not had any additional chest pain or shortness of  breath.  He states this is happened once before but it resolved and he did not present for evaluation.  He has focal epigastric tenderness with some voluntary guarding and some more mild generalized tenderness throughout the abdomen.  Patient does drink alcohol regularly.  Concern for potential gastritis, versus pancreatitis, or hepatitis, could have peptic ulcer disease as well.  He does not have focal right upper quadrant tenderness, feel cholecystitis is less likely.  Presentation does not sound typical for dissection and patient has equal pulses in all extremities.  Will get basic labs, EKG, chest x-ray troponin, lipase, hepatic function and CT abdomen pelvis.  Will give IV fluids, pain and nausea medicine.  Patient clinically appears dry and dehydrated and I suspect this is the cause of his tachycardia.   I have independently ordered, reviewed and interpreted all labs and imaging: CBC: No leukocytosis, elevated hemoglobin suggesting hemoconcentration BMP: Mild hyponatremia and hypochloremia, likely in the setting of dehydration, patient does have an elevated anion gap of 21, but within normal CO2, we will recheck after rehydration with IV fluids. Hepatic panel: Elevated total protein and minimally elevated T bili, but normal LFTs Lipase: Significantly elevated at 1638 consistent with acute pancreatitis Troponin: Negative, given chest pain that was last present 3 days ago do not think that delta troponin is indicated. EKG: No acute ischemic changes, sinus tachycardia.  Chest x-ray with no active cardiopulmonary disease  CT abdomen pelvis consistent with acute pancreatitis with no evidence of pseudocyst, abscess or necrosis, no other acute findings noted.  On reevaluation patient's  tachycardia has improved and pain is currently improved but given significantly elevated lipase will require hospital admission, he has no evidence of gallstone disease or biliary obstruction, suspect this is caused by alcohol.  Will consult for medicine admission.  Case discussed with Dr. Estelle Grumbles with [facility] who will see and admit the patient once there is a bed available for transfer, in the meantime recommends checking lipids, giving additional 1 L bolus of LR and then starting on 150/h LR infusion, pain medication as needed, and rechecking basic labs and lipase in the morning.  Final Clinical Impression(s) / ED Diagnoses Final diagnoses:  Alcohol-induced acute pancreatitis without infection or necrosis    Rx / DC Orders ED Discharge Orders    None       Legrand Rams 04/10/20 1509    Alvira Monday, MD 04/10/20 2248

## 2020-04-11 DIAGNOSIS — F1010 Alcohol abuse, uncomplicated: Secondary | ICD-10-CM

## 2020-04-11 LAB — CBC
HCT: 41.5 % (ref 39.0–52.0)
Hemoglobin: 14.7 g/dL (ref 13.0–17.0)
MCH: 36.6 pg — ABNORMAL HIGH (ref 26.0–34.0)
MCHC: 35.4 g/dL (ref 30.0–36.0)
MCV: 103.2 fL — ABNORMAL HIGH (ref 80.0–100.0)
Platelets: 149 10*3/uL — ABNORMAL LOW (ref 150–400)
RBC: 4.02 MIL/uL — ABNORMAL LOW (ref 4.22–5.81)
RDW: 13.7 % (ref 11.5–15.5)
WBC: 6.1 10*3/uL (ref 4.0–10.5)
nRBC: 0 % (ref 0.0–0.2)

## 2020-04-11 LAB — COMPREHENSIVE METABOLIC PANEL
ALT: 16 U/L (ref 0–44)
AST: 22 U/L (ref 15–41)
Albumin: 3.3 g/dL — ABNORMAL LOW (ref 3.5–5.0)
Alkaline Phosphatase: 27 U/L — ABNORMAL LOW (ref 38–126)
Anion gap: 13 (ref 5–15)
BUN: 9 mg/dL (ref 8–23)
CO2: 28 mmol/L (ref 22–32)
Calcium: 9.2 mg/dL (ref 8.9–10.3)
Chloride: 94 mmol/L — ABNORMAL LOW (ref 98–111)
Creatinine, Ser: 0.74 mg/dL (ref 0.61–1.24)
GFR calc Af Amer: >60 mL/min (ref >60)
GFR calc non Af Amer: >60 mL/min (ref >60)
Glucose, Bld: 99 mg/dL (ref 70–99)
Potassium: 3.5 mmol/L (ref 3.5–5.1)
Sodium: 135 mmol/L (ref 135–145)
Total Bilirubin: 1.2 mg/dL (ref 0.3–1.2)
Total Protein: 6.6 g/dL (ref 6.5–8.1)

## 2020-04-11 LAB — LIPID PANEL
Cholesterol: 157 mg/dL (ref 0–200)
HDL: 59 mg/dL (ref >40)
LDL Cholesterol: 78 mg/dL (ref 0–99)
Total CHOL/HDL Ratio: 2.7 RATIO
Triglycerides: 102 mg/dL (ref <150)
VLDL: 20 mg/dL (ref 0–40)

## 2020-04-11 LAB — HIV ANTIBODY (ROUTINE TESTING W REFLEX): HIV Screen 4th Generation wRfx: NONREACTIVE

## 2020-04-11 LAB — LIPASE, BLOOD: Lipase: 675 U/L — ABNORMAL HIGH (ref 11–51)

## 2020-04-11 MED ORDER — HYDRALAZINE HCL 20 MG/ML IJ SOLN
10.0000 mg | Freq: Once | INTRAMUSCULAR | Status: AC
  Administered 2020-04-11: 10 mg via INTRAVENOUS
  Filled 2020-04-11: qty 1

## 2020-04-11 MED ORDER — NIFEDIPINE 10 MG PO CAPS
20.0000 mg | ORAL_CAPSULE | Freq: Every day | ORAL | Status: DC
  Administered 2020-04-12: 09:00:00 20 mg via ORAL
  Filled 2020-04-11: qty 2

## 2020-04-11 NOTE — Progress Notes (Signed)
PROGRESS NOTE    Jared Duran   FUX:[id number]  DOB: Dec 14, 1950  DOA: 04/10/2020 PCP: Brayton El, PA-C   Brief Narrative:  Jared Duran is a 69 y.o. male with medical history of HTN not on meds, etoh abuse coming in with initially chest pain 3 days ago but 1 day later pain moved to epigastric area along with intractable nausea vomiting. Lipase 1638 CT abd>  acute pancreatitis  Subjective: Is hungry. Abdominal pain is improving.  Assessment & Plan:   Principal Problem:   Acute pancreatitis - due to ETOH abuse- Triglycerdes are normal- imaging does not suggest gallstones - he will be advanced to full liquids today- will cont IVF until drinking/ eating well  Active Problems:   Alcohol abuse - no signs of withdrawal- cont to follow- have counseled him and his girlfriend of the need for abstinence- SW called to help offer resources  Time spent in minutes: 35 DVT prophylaxis: Lovenox Code Status: Full code Family Communication: girlfriend at bedside Disposition Plan:  Status is: Inpatient  Remains inpatient appropriate because:treating acute pancreatitis. cont IVF   Dispo: The patient is from: Home              Anticipated d/c is to: Home              Anticipated d/c date is: 2 days              Patient currently is not medically stable to d/c.      Consultants:   none Procedures:   none Antimicrobials:  Anti-infectives (From admission, onward)   None       Objective: Vitals:   04/10/20 2358 04/11/20 0600 04/11/20 0731 04/11/20 1220  BP: (!) 167/111 (!) 164/115 (!) 130/100 (!) 136/94  Pulse: 100 82 (!) 102 99  Resp: 16 17 18  (!) 22  Temp: 98.4 F (36.9 C) 99 F (37.2 C) 98.2 F (36.8 C) 98.5 F (36.9 C)  TempSrc: Oral Oral Oral Oral  SpO2: 91% 94% 94% 92%  Weight:      Height:        Intake/Output Summary (Last 24 hours) at 04/11/2020 1725 Last data filed at 04/11/2020 1400 Gross per 24 hour  Intake 4423.16 ml  Output 2200 ml    Net 2223.16 ml   Filed Weights   04/10/20 1123  Weight: 63.5 kg    Examination: General exam: Appears comfortable  HEENT: PERRLA, oral mucosa moist, no sclera icterus or thrush Respiratory system: Clear to auscultation. Respiratory effort normal. Cardiovascular system: S1 & S2 heard, RRR.   Gastrointestinal system: Abdomen soft, tender in epigastrium, nondistended. Normal bowel sounds. Central nervous system: Alert and oriented. No focal neurological deficits. Extremities: No cyanosis, clubbing or edema Skin: No rashes or ulcers Psychiatry:  Mood & affect appropriate.     Data Reviewed: I have personally reviewed following labs and imaging studies  CBC: Recent Labs  Lab 04/10/20 1132 04/11/20 0523  WBC 7.5 6.1  HGB 17.9* 14.7  HCT 51.1 41.5  MCV 103.0* 103.2*  PLT 214 149*   Basic Metabolic Panel: Recent Labs  Lab 04/10/20 1132 04/11/20 0523  NA 134* 135  K 3.9 3.5  CL 87* 94*  CO2 26 28  GLUCOSE 125* 99  BUN 14 9  CREATININE 1.09 0.74  CALCIUM 9.8 9.2   GFR: Estimated Creatinine Clearance: 81.6 mL/min (by C-G formula based on SCr of 0.74 mg/dL). Liver Function Tests: Recent Labs  Lab 04/10/20 1132 04/11/20 0523  AST  35 22  ALT 20 16  ALKPHOS 38 27*  BILITOT 1.4* 1.2  PROT 9.1* 6.6  ALBUMIN 4.4 3.3*   Recent Labs  Lab 04/10/20 1132 04/11/20 0523  LIPASE 1,638* 675*   No results for input(s): AMMONIA in the last 168 hours. Coagulation Profile: No results for input(s): INR, PROTIME in the last 168 hours. Cardiac Enzymes: No results for input(s): CKTOTAL, CKMB, CKMBINDEX, TROPONINI in the last 168 hours. BNP (last 3 results) No results for input(s): PROBNP in the last 8760 hours. HbA1C: No results for input(s): HGBA1C in the last 72 hours. CBG: No results for input(s): GLUCAP in the last 168 hours. Lipid Profile: Recent Labs    04/11/20 0523  CHOL 157  HDL 59  LDLCALC 78  TRIG 102  CHOLHDL 2.7   Thyroid Function Tests: No results  for input(s): TSH, T4TOTAL, FREET4, T3FREE, THYROIDAB in the last 72 hours. Anemia Panel: No results for input(s): VITAMINB12, FOLATE, FERRITIN, TIBC, IRON, RETICCTPCT in the last 72 hours. Urine analysis:    Component Value Date/Time   COLORURINE YELLOW 10/14/2018 1217   APPEARANCEUR CLOUDY (A) 10/14/2018 1217   LABSPEC 1.025 10/14/2018 1217   PHURINE 7.0 10/14/2018 1217   GLUCOSEU NEGATIVE 10/14/2018 1217   HGBUR LARGE (A) 10/14/2018 1217   BILIRUBINUR SMALL (A) 10/14/2018 1217   KETONESUR >80 (A) 10/14/2018 1217   PROTEINUR >300 (A) 10/14/2018 1217   NITRITE NEGATIVE 10/14/2018 1217   LEUKOCYTESUR NEGATIVE 10/14/2018 1217   Sepsis Labs: @LABRCNTIP (procalcitonin:4,lacticidven:4) ) Recent Results (from the past 240 hour(s))  SARS Coronavirus 2 by RT PCR (hospital order, performed in [facility] hospital lab) Nasopharyngeal Nasopharyngeal Swab     Status: None   Collection Time: 04/10/20  1:03 PM   Specimen: Nasopharyngeal Swab  Result Value Ref Range Status   SARS Coronavirus 2 NEGATIVE NEGATIVE Final    Comment: (NOTE) SARS-CoV-2 target nucleic acids are NOT DETECTED.  The SARS-CoV-2 RNA is generally detectable in upper and lower respiratory specimens during the acute phase of infection. The lowest concentration of SARS-CoV-2 viral copies this assay can detect is 250 copies / mL. A negative result does not preclude SARS-CoV-2 infection and should not be used as the sole basis for treatment or other patient management decisions.  A negative result may occur with improper specimen collection / handling, submission of specimen other than nasopharyngeal swab, presence of viral mutation(s) within the areas targeted by this assay, and inadequate number of viral copies (<250 copies / mL). A negative result must be combined with clinical observations, patient history, and epidemiological information.  Fact Sheet for Patients:   06/10/20  Fact  Sheet for Healthcare Providers: BoilerBrush.com.cy  This test is not yet approved or  cleared by the https://pope.com/ FDA and has been authorized for detection and/or diagnosis of SARS-CoV-2 by FDA under an Emergency Use Authorization (EUA).  This EUA will remain in effect (meaning this test can be used) for the duration of the COVID-19 declaration under Section 564(b)(1) of the Act, 21 U.S.C. section 360bbb-3(b)(1), unless the authorization is terminated or revoked sooner.  Performed at [facility], [address], [place], [place]          Radiology Studies: DG Chest 2 View  Result Date: 04/10/2020 CLINICAL DATA:  Chest pain abdominal pain 3 days EXAM: CHEST - 2 VIEW COMPARISON:  05/12/2019 FINDINGS: The heart size and mediastinal contours are within normal limits. Both lungs are clear. The visualized skeletal structures are unremarkable.  IMPRESSION: No active cardiopulmonary disease. Electronically Signed   By: Marlan Palau M.D.   On: 04/10/2020 12:52   CT ABDOMEN PELVIS W CONTRAST  Result Date: 04/10/2020 CLINICAL DATA:  Generalized abdominal pain, most severe in the epigastric region with guarding and vomiting. EXAM: CT ABDOMEN AND PELVIS WITH CONTRAST TECHNIQUE: Multidetector CT imaging of the abdomen and pelvis was performed using the standard protocol following bolus administration of intravenous contrast. CONTRAST:  OMNIPAQUE IOHEXOL 300 MG/ML  SOLN COMPARISON:  11/09/2019 FINDINGS: Lower chest: The visualized lung bases are clear. Hepatobiliary: Diffusely decreased attenuation of the liver consistent with steatosis. 2 subcentimeter hypodensities in the right hepatic lobe, unchanged and too small to fully characterize. Unremarkable gallbladder. No biliary dilatation. Pancreas: Extensive diffuse peripancreatic inflammation. No evidence of pancreatic necrosis, ductal dilatation, or fluid collection. Spleen: Unremarkable.  Adrenals/Urinary Tract: Unremarkable adrenal glands. 1.7 cm right renal cyst. Subcentimeter hypodensities in both kidneys, too small to fully characterize. No renal calculi or hydronephrosis. Unremarkable bladder. Stomach/Bowel: The stomach is unremarkable. There is no evidence of bowel obstruction. Left-sided colonic diverticulosis is noted without evidence of acute diverticulitis. Vascular/Lymphatic: Abdominal aortic atherosclerosis without aneurysm. No enlarged lymph nodes. Reproductive: Unremarkable prostate. Other: Small volume ascites.  No pneumoperitoneum. Musculoskeletal: No acute osseous abnormality or suspicious osseous lesion. IMPRESSION: 1. Acute pancreatitis. No evidence of pancreatic necrosis or fluid collection. 2. Hepatic steatosis. 3. Aortic Atherosclerosis (ICD10-I70.0). Electronically Signed   By: Sebastian Ache M.D.   On: 04/10/2020 12:59      Scheduled Meds: . enoxaparin (LOVENOX) injection  40 mg Subcutaneous Q24H  . folic acid  1 mg Oral Daily  . multivitamin with minerals  1 tablet Oral Daily  . [START ON 04/12/2020] NIFEdipine  20 mg Oral Daily  . pantoprazole (PROTONIX) IV  40 mg Intravenous Q24H  . pneumococcal 23 valent vaccine  0.5 mL Intramuscular Tomorrow-1000  . thiamine  100 mg Oral Daily   Or  . thiamine  100 mg Intravenous Daily   Continuous Infusions: . lactated ringers 150 mL/hr at 04/11/20 1309     LOS: 1 day      Calvert Cantor, MD [facility] Pager: [email or web address] 04/11/2020, 5:25 PM

## 2020-04-11 NOTE — Progress Notes (Addendum)
CSW met with the patient at bedside and introduced role. Patient family present. Patient asked if csw can return at a later time. Patient reported having pain and was waiting for his pain medication.  CSW will visit the patient at a different time.   Kathrin Greathouse, Marlinda Mike, MSW Transitions of Care-Clinical Social Worker  [phone] 04/11/2020  3:15 PM

## 2020-04-12 DIAGNOSIS — I10 Essential (primary) hypertension: Secondary | ICD-10-CM

## 2020-04-12 DIAGNOSIS — K700 Alcoholic fatty liver: Secondary | ICD-10-CM

## 2020-04-12 DIAGNOSIS — K7030 Alcoholic cirrhosis of liver without ascites: Secondary | ICD-10-CM

## 2020-04-12 MED ORDER — FOLIC ACID 1 MG PO TABS: 1.0000 mg | ORAL_TABLET | Freq: Every day | ORAL | 0 refills | Status: DC

## 2020-04-12 MED ORDER — ADULT MULTIVITAMIN W/MINERALS CH: 1.0000 | ORAL_TABLET | Freq: Every day | ORAL | 0 refills | Status: DC

## 2020-04-12 MED ORDER — THIAMINE HCL 100 MG PO TABS: 100.0000 mg | ORAL_TABLET | Freq: Every day | ORAL | 0 refills | Status: DC

## 2020-04-12 NOTE — Discharge Summary (Signed)
Physician Discharge Summary  Jared Main XKG:[id number] DOB: 1951/08/20 DOA: 04/10/2020  PCP: Brayton El, PA-C  Admit date: 04/10/2020 Discharge date: 04/12/2020  Admitted From: home Disposition:  home   Recommendations for Outpatient Follow-up:  1. Continue to encourage alcohol cessation  Home Health:  none needed Discharge Condition:  stable   CODE STATUS:  Full code   Diet recommendation:  Heart healthy, low fat Consultations:  none  Procedures/Studies: . none   Discharge Diagnoses:  Principal Problem:   Acute pancreatitis Active Problems:   Alcohol abuse   Fatty liver    Brief Summary: Jared Duran is a 69 y.o.malewith medical history ofHTNnot on meds, etoh abusecoming in with initially chest pain 3 days ago but1daylater pain moved toepigastric areaand was associated withintractable nausea vomiting. Lipase 1638 CT abd>  acute pancreatitis  Hospital Course:  Principal Problem:   Acute pancreatitis, Hepatic steatosis - due to ETOH abuse - Triglycerdes are normal- imaging does not suggest gallstones -;ipase improving and pain resolving-  he has recuperated and is tolerating a solid diet- stable to d/c- have strongly advised him to stop drinking alcohol- I have discussed this with his girlfriend and his daughter as well  Active Problems:   Alcohol abuse - no signs of withdrawal- -He drinks "a lot" of Whiskey and some beer and he is willing to stop -  have counseled him and his girlfriend of the need for abstinence - SW called to help offer resources but he is not interested in that  HTN - cont Losartan and Nifedipine    Discharge Exam: Vitals:   04/12/20 0503 04/12/20 0625  BP: (!) 159/104 (!) 147/101  Pulse: 89 (!) 103  Resp: 16   Temp: 98.1 F (36.7 C)   SpO2: 94%    Vitals:   04/11/20 1818 04/11/20 2131 04/12/20 0503 04/12/20 0625  BP: (!) 144/99 (!) 152/98 (!) 159/104 (!) 147/101  Pulse: 90 (!) 103 89 (!) 103  Resp: (!)  22 16 16    Temp: 98.1 F (36.7 C) 98.1 F (36.7 C) 98.1 F (36.7 C)   TempSrc: Oral Oral Oral   SpO2: 93% 94% 94%   Weight:      Height:        General: Pt is alert, awake, not in acute distress Cardiovascular: RRR, S1/S2 +, no rubs, no gallops Respiratory: CTA bilaterally, no wheezing, no rhonchi Abdominal: Soft, NT, ND, bowel sounds + Extremities: no edema, no cyanosis   Discharge Instructions  Discharge Instructions    Diet - low sodium heart healthy   Complete by: As directed    Low fat   Increase activity slowly   Complete by: As directed      Allergies as of 04/12/2020   No Known Allergies     Medication List    STOP taking these medications   ondansetron 4 MG disintegrating tablet Commonly known as: Zofran ODT   predniSONE 20 MG tablet Commonly known as: DELTASONE     TAKE these medications   folic acid 1 MG tablet Commonly known as: FOLVITE Take 1 tablet (1 mg total) by mouth daily. Start taking on: April 13, 2020   losartan 100 MG tablet Commonly known as: COZAAR Take 100 mg by mouth daily.   multivitamin with minerals Tabs tablet Take 1 tablet by mouth daily. Start taking on: April 13, 2020   NIFEdipine 20 MG capsule Commonly known as: PROCARDIA Take 1 capsule (20 mg total) by mouth 3 (three) times daily. What changed:  when to take this   thiamine 100 MG tablet Take 1 tablet (100 mg total) by mouth daily. Start taking on: April 13, 2020       No Known Allergies    DG Chest 2 View  Result Date: 04/10/2020 CLINICAL DATA:  Chest pain abdominal pain 3 days EXAM: CHEST - 2 VIEW COMPARISON:  05/12/2019 FINDINGS: The heart size and mediastinal contours are within normal limits. Both lungs are clear. The visualized skeletal structures are unremarkable. IMPRESSION: No active cardiopulmonary disease. Electronically Signed   By: Marlan Palauharles  Clark M.D.   On: 04/10/2020 12:52   CT ABDOMEN PELVIS W CONTRAST  Result Date: 04/10/2020 CLINICAL DATA:   Generalized abdominal pain, most severe in the epigastric region with guarding and vomiting. EXAM: CT ABDOMEN AND PELVIS WITH CONTRAST TECHNIQUE: Multidetector CT imaging of the abdomen and pelvis was performed using the standard protocol following bolus administration of intravenous contrast. CONTRAST:  100mL OMNIPAQUE IOHEXOL 300 MG/ML  SOLN COMPARISON:  11/09/2019 FINDINGS: Lower chest: The visualized lung bases are clear. Hepatobiliary: Diffusely decreased attenuation of the liver consistent with steatosis. 2 subcentimeter hypodensities in the right hepatic lobe, unchanged and too small to fully characterize. Unremarkable gallbladder. No biliary dilatation. Pancreas: Extensive diffuse peripancreatic inflammation. No evidence of pancreatic necrosis, ductal dilatation, or fluid collection. Spleen: Unremarkable. Adrenals/Urinary Tract: Unremarkable adrenal glands. 1.7 cm right renal cyst. Subcentimeter hypodensities in both kidneys, too small to fully characterize. No renal calculi or hydronephrosis. Unremarkable bladder. Stomach/Bowel: The stomach is unremarkable. There is no evidence of bowel obstruction. Left-sided colonic diverticulosis is noted without evidence of acute diverticulitis. Vascular/Lymphatic: Abdominal aortic atherosclerosis without aneurysm. No enlarged lymph nodes. Reproductive: Unremarkable prostate. Other: Small volume ascites.  No pneumoperitoneum. Musculoskeletal: No acute osseous abnormality or suspicious osseous lesion. IMPRESSION: 1. Acute pancreatitis. No evidence of pancreatic necrosis or fluid collection. 2. Hepatic steatosis. 3. Aortic Atherosclerosis (ICD10-I70.0). Electronically Signed   By: Sebastian AcheAllen  Grady M.D.   On: 04/10/2020 12:59      The results of significant diagnostics from this hospitalization (including imaging, microbiology, ancillary and laboratory) are listed below for reference.     Microbiology: Recent Results (from the past 240 hour(s))  SARS Coronavirus 2 by  RT PCR (hospital order, [facility]) Nasopharyngeal Nasopharyngeal Swab     Status: None   Collection Time: 04/10/20  1:03 PM   Specimen: Nasopharyngeal Swab  Result Value Ref Range Status   SARS Coronavirus 2 NEGATIVE NEGATIVE Final    Comment: (NOTE) SARS-CoV-2 target nucleic acids are NOT DETECTED.  The SARS-CoV-2 RNA is generally detectable in upper and lower respiratory specimens during the acute phase of infection. The lowest concentration of SARS-CoV-2 viral copies this assay can detect is 250 copies / mL. A negative result does not preclude SARS-CoV-2 infection and should not be used as the sole basis for treatment or other patient management decisions.  A negative result may occur with improper specimen collection / handling, submission of specimen other than nasopharyngeal swab, presence of viral mutation(s) within the areas targeted by this assay, and inadequate number of viral copies (<250 copies / mL). A negative result must be combined with clinical observations, patient history, and epidemiological information.  Fact Sheet for Patients:   BoilerBrush.com.cyhttps://www.fda.gov/media/136312/download  Fact Sheet for Healthcare Providers: https://pope.com/https://www.fda.gov/media/136313/download  This test is not yet approved or  cleared by the [place] FDA and has been authorized for detection and/or diagnosis of SARS-CoV-2 by FDA under an Emergency Use Authorization (EUA).  This  EUA will remain in effect (meaning this test can be used) for the duration of the COVID-19 declaration under Section 564(b)(1) of the Act, 21 U.S.C. section 360bbb-3(b)(1), unless the authorization is terminated or revoked sooner.  Performed at [facility], [address] [place], [place] [address]      Labs: BNP (last 3 results) No results for input(s): BNP in the last 8760 hours. Basic Metabolic Panel: Recent Labs  Lab 04/10/20 1132 04/11/20 0523  NA 134* 135  K 3.9 3.5   CL 87* 94*  CO2 26 28  GLUCOSE 125* 99  BUN 14 9  CREATININE 1.09 0.74  CALCIUM 9.8 9.2   Liver Function Tests: Recent Labs  Lab 04/10/20 1132 04/11/20 0523  AST 35 22  ALT 20 16  ALKPHOS 38 27*  BILITOT 1.4* 1.2  PROT 9.1* 6.6  ALBUMIN 4.4 3.3*   Recent Labs  Lab 04/10/20 1132 04/11/20 0523  LIPASE 1,638* 675*   No results for input(s): AMMONIA in the last 168 hours. CBC: Recent Labs  Lab 04/10/20 1132 04/11/20 0523  WBC 7.5 6.1  HGB 17.9* 14.7  HCT 51.1 41.5  MCV 103.0* 103.2*  PLT 214 149*   Cardiac Enzymes: No results for input(s): CKTOTAL, CKMB, CKMBINDEX, TROPONINI in the last 168 hours. BNP: Invalid input(s): POCBNP CBG: No results for input(s): GLUCAP in the last 168 hours. D-Dimer No results for input(s): DDIMER in the last 72 hours. Hgb A1c No results for input(s): HGBA1C in the last 72 hours. Lipid Profile Recent Labs    04/11/20 0523  CHOL 157  HDL 59  LDLCALC 78  TRIG 102  CHOLHDL 2.7   Thyroid function studies No results for input(s): TSH, T4TOTAL, T3FREE, THYROIDAB in the last 72 hours.  Invalid input(s): FREET3 Anemia work up No results for input(s): VITAMINB12, FOLATE, FERRITIN, TIBC, IRON, RETICCTPCT in the last 72 hours. Urinalysis    Component Value Date/Time   COLORURINE YELLOW 10/14/2018 1217   APPEARANCEUR CLOUDY (A) 10/14/2018 1217   LABSPEC 1.025 10/14/2018 1217   PHURINE 7.0 10/14/2018 1217   GLUCOSEU NEGATIVE 10/14/2018 1217   HGBUR LARGE (A) 10/14/2018 1217   BILIRUBINUR SMALL (A) 10/14/2018 1217   KETONESUR >80 (A) 10/14/2018 1217   PROTEINUR >300 (A) 10/14/2018 1217   NITRITE NEGATIVE 10/14/2018 1217   LEUKOCYTESUR NEGATIVE 10/14/2018 1217   Sepsis Labs Invalid input(s): PROCALCITONIN,  WBC,  LACTICIDVEN Microbiology Recent Results (from the past 240 hour(s))  SARS Coronavirus 2 by RT PCR (hospital order, performed in [facility] hospital lab) Nasopharyngeal Nasopharyngeal Swab     Status: None    Collection Time: 04/10/20  1:03 PM   Specimen: Nasopharyngeal Swab  Result Value Ref Range Status   SARS Coronavirus 2 NEGATIVE NEGATIVE Final    Comment: (NOTE) SARS-CoV-2 target nucleic acids are NOT DETECTED.  The SARS-CoV-2 RNA is generally detectable in upper and lower respiratory specimens during the acute phase of infection. The lowest concentration of SARS-CoV-2 viral copies this assay can detect is 250 copies / mL. A negative result does not preclude SARS-CoV-2 infection and should not be used as the sole basis for treatment or other patient management decisions.  A negative result may occur with improper specimen collection / handling, submission of specimen other than nasopharyngeal swab, presence of viral mutation(s) within the areas targeted by this assay, and inadequate number of viral copies (<250 copies / mL). A negative result must be combined with clinical observations, patient history, and epidemiological information.  Fact Sheet for Patients:   BoilerBrush.com.cy  Fact Sheet for Healthcare Providers: https://pope.com/  This test is not yet approved or  cleared by the [place] FDA and has been authorized for detection and/or diagnosis of SARS-CoV-2 by FDA under an Emergency Use Authorization (EUA).  This EUA will remain in effect (meaning this test can be used) for the duration of the COVID-19 declaration under Section 564(b)(1) of the Act, 21 U.S.C. section 360bbb-3(b)(1), unless the authorization is terminated or revoked sooner.  Performed at [facility], [address] [place], [place] [address]      Time coordinating discharge in minutes: 65  SIGNED:   Calvert Cantor, MD  [facility] 04/12/2020, 11:10 AM

## 2020-04-12 NOTE — Progress Notes (Signed)
Discharge instructions discussed with patient and family, verbalized agreement and understanding 

## 2020-04-12 NOTE — TOC Initial Note (Signed)
Transition of Care [facility]) - Initial/Assessment Note    Patient Details  Name: Jared Duran MRN: [id number] Date of Birth: 09-05-1951  [facility]) CM/SW Contact:    Lia Hopping, [place] Phone Number: 04/12/2020, 12:29 PM  Clinical Narrative:                 CSW met with the patient at bedside to discuss to [place] resources. Patient agreeable to talk. Patient reports, " I am going to leave that sh** alone. My girlfriend, children and sister keep telling me to stop." Patient reports he drinks beer and take shots every day. Patient reports the last time he was sober was about twenty years ago. He was sober for 10 years. Patient cannot recall why he started drinking and smoking again. CSW explain SA resources in [place] and [place]. Patient reports he is not interested in counseling or treatment at this time. Patient reports, "only I can make me stop drinking." Patient appreciative of CSW visit. No other needs identified.   Expected Discharge Plan: Home/Self Care     Patient Goals and CMS Choice     Choice offered to / list presented to : NA  Expected Discharge Plan and Services Expected Discharge Plan: Home/Self Care In-house Referral: NA Discharge Planning Services: NA Post Acute Care Choice: NA Living arrangements for the past 2 months: Single Family Home                 DME Arranged: N/A DME Agency: NA       HH Arranged: NA HH Agency: NA        Prior Living Arrangements/Services Living arrangements for the past 2 months: Single Family Home Lives with:: Significant Other Patient language and need for interpreter reviewed:: No Do you feel safe going back to the place where you live?: Yes      Need for Family Participation in Patient Care: Yes (Comment) Care giver support system in place?: Yes (comment)   Criminal Activity/Legal Involvement Pertinent to Current Situation/Hospitalization: No - Comment as needed  Activities of Daily Living Home  Assistive Devices/Equipment: None ADL Screening (condition at time of admission) Patient's cognitive ability adequate to safely complete daily activities?: No Is the patient deaf or have difficulty hearing?: No Does the patient have difficulty seeing, even when wearing glasses/contacts?: No Does the patient have difficulty concentrating, remembering, or making decisions?: No Patient able to express need for assistance with ADLs?: Yes Does the patient have difficulty dressing or bathing?: No Independently performs ADLs?: Yes (appropriate for developmental age) Does the patient have difficulty walking or climbing stairs?: No Weakness of Legs: None Weakness of Arms/Hands: None  Permission Sought/Granted   Permission granted to share information with : No              Emotional Assessment Appearance:: Appears stated age   Affect (typically observed): Accepting Orientation: : Oriented to Self, Oriented to Place, Oriented to  Time, Oriented to Situation Alcohol / Substance Use: Not Applicable Psych Involvement: No (comment)  Admission diagnosis:  Acute pancreatitis [K85.90] Alcohol-induced acute pancreatitis without infection or necrosis [K85.20] Patient Active Problem List   Diagnosis Date Noted  . Alcohol abuse 04/11/2020  . Acute pancreatitis 04/10/2020  . Acute alcoholic pancreatitis 67/59/1638   PCP:  Drosinis, Pamalee Leyden, PA-C Pharmacy:   [place] [place] - [address] [place] [address] Phone: [phone] Fax: [phone]     Social Determinants  of Health (SDOH) Interventions    Readmission Risk Interventions No flowsheet data found.  

## 2021-02-07 ENCOUNTER — Encounter (HOSPITAL_BASED_OUTPATIENT_CLINIC_OR_DEPARTMENT_OTHER): Payer: Self-pay | Admitting: *Deleted

## 2021-02-07 ENCOUNTER — Other Ambulatory Visit: Payer: Self-pay

## 2021-02-07 ENCOUNTER — Emergency Department (HOSPITAL_BASED_OUTPATIENT_CLINIC_OR_DEPARTMENT_OTHER): Payer: Medicare Other

## 2021-02-07 ENCOUNTER — Inpatient Hospital Stay (HOSPITAL_BASED_OUTPATIENT_CLINIC_OR_DEPARTMENT_OTHER)
Admission: EM | Admit: 2021-02-07 | Discharge: 2021-02-12 | DRG: 438 | Disposition: A | Discharge status: Home / Self Care | Payer: Medicare Other | Attending: Internal Medicine | Admitting: Internal Medicine

## 2021-02-07 DIAGNOSIS — R945 Abnormal results of liver function studies: Secondary | ICD-10-CM

## 2021-02-07 DIAGNOSIS — F17210 Nicotine dependence, cigarettes, uncomplicated: Secondary | ICD-10-CM | POA: Diagnosis present

## 2021-02-07 DIAGNOSIS — K831 Obstruction of bile duct: Secondary | ICD-10-CM | POA: Diagnosis present

## 2021-02-07 DIAGNOSIS — I10 Essential (primary) hypertension: Secondary | ICD-10-CM | POA: Diagnosis present

## 2021-02-07 DIAGNOSIS — D539 Nutritional anemia, unspecified: Secondary | ICD-10-CM | POA: Diagnosis present

## 2021-02-07 DIAGNOSIS — Z20822 Contact with and (suspected) exposure to covid-19: Secondary | ICD-10-CM | POA: Diagnosis present

## 2021-02-07 DIAGNOSIS — R933 Abnormal findings on diagnostic imaging of other parts of digestive tract: Secondary | ICD-10-CM

## 2021-02-07 DIAGNOSIS — Z79899 Other long term (current) drug therapy: Secondary | ICD-10-CM

## 2021-02-07 DIAGNOSIS — E876 Hypokalemia: Secondary | ICD-10-CM | POA: Diagnosis not present

## 2021-02-07 DIAGNOSIS — R188 Other ascites: Secondary | ICD-10-CM | POA: Diagnosis present

## 2021-02-07 DIAGNOSIS — K859 Acute pancreatitis without necrosis or infection, unspecified: Secondary | ICD-10-CM

## 2021-02-07 DIAGNOSIS — Z681 Body mass index (BMI) 19 or less, adult: Secondary | ICD-10-CM

## 2021-02-07 DIAGNOSIS — E871 Hypo-osmolality and hyponatremia: Secondary | ICD-10-CM | POA: Diagnosis present

## 2021-02-07 DIAGNOSIS — R634 Abnormal weight loss: Secondary | ICD-10-CM | POA: Diagnosis present

## 2021-02-07 DIAGNOSIS — K852 Alcohol induced acute pancreatitis without necrosis or infection: Principal | ICD-10-CM | POA: Diagnosis present

## 2021-02-07 DIAGNOSIS — R7989 Other specified abnormal findings of blood chemistry: Secondary | ICD-10-CM

## 2021-02-07 DIAGNOSIS — D696 Thrombocytopenia, unspecified: Secondary | ICD-10-CM | POA: Diagnosis present

## 2021-02-07 DIAGNOSIS — K5730 Diverticulosis of large intestine without perforation or abscess without bleeding: Secondary | ICD-10-CM | POA: Diagnosis present

## 2021-02-07 DIAGNOSIS — K824 Cholesterolosis of gallbladder: Secondary | ICD-10-CM | POA: Diagnosis present

## 2021-02-07 DIAGNOSIS — K219 Gastro-esophageal reflux disease without esophagitis: Secondary | ICD-10-CM | POA: Diagnosis present

## 2021-02-07 HISTORY — DX: Acute pancreatitis without necrosis or infection, unspecified: K85.90

## 2021-02-07 LAB — CBC WITH DIFFERENTIAL/PLATELET
Abs Immature Granulocytes: 0.07 10*3/uL (ref 0.00–0.07)
Basophils Absolute: 0 10*3/uL (ref 0.0–0.1)
Basophils Relative: 0 %
Eosinophils Absolute: 0.1 10*3/uL (ref 0.0–0.5)
Eosinophils Relative: 1 %
HCT: 41.2 % (ref 39.0–52.0)
Hemoglobin: 15.2 g/dL (ref 13.0–17.0)
Immature Granulocytes: 1 %
Lymphocytes Relative: 14 %
Lymphs Abs: 1.4 10*3/uL (ref 0.7–4.0)
MCH: 37.3 pg — ABNORMAL HIGH (ref 26.0–34.0)
MCHC: 36.9 g/dL — ABNORMAL HIGH (ref 30.0–36.0)
MCV: 101 fL — ABNORMAL HIGH (ref 80.0–100.0)
Monocytes Absolute: 1 10*3/uL (ref 0.1–1.0)
Monocytes Relative: 10 %
Neutro Abs: 7.1 10*3/uL (ref 1.7–7.7)
Neutrophils Relative %: 74 %
Platelets: 182 10*3/uL (ref 150–400)
RBC: 4.08 MIL/uL — ABNORMAL LOW (ref 4.22–5.81)
RDW: 14.9 % (ref 11.5–15.5)
WBC: 9.6 10*3/uL (ref 4.0–10.5)
nRBC: 0 % (ref 0.0–0.2)

## 2021-02-07 LAB — COMPREHENSIVE METABOLIC PANEL
ALT: 135 U/L — ABNORMAL HIGH (ref 0–44)
AST: 162 U/L — ABNORMAL HIGH (ref 15–41)
Albumin: 3.5 g/dL (ref 3.5–5.0)
Alkaline Phosphatase: 168 U/L — ABNORMAL HIGH (ref 38–126)
Anion gap: 14 (ref 5–15)
BUN: 34 mg/dL — ABNORMAL HIGH (ref 8–23)
CO2: 26 mmol/L (ref 22–32)
Calcium: 9.3 mg/dL (ref 8.9–10.3)
Chloride: 92 mmol/L — ABNORMAL LOW (ref 98–111)
Creatinine, Ser: 1.18 mg/dL (ref 0.61–1.24)
GFR, Estimated: >60 mL/min (ref >60)
Glucose, Bld: 156 mg/dL — ABNORMAL HIGH (ref 70–99)
Potassium: 3.7 mmol/L (ref 3.5–5.1)
Sodium: 132 mmol/L — ABNORMAL LOW (ref 135–145)
Total Bilirubin: 13.1 mg/dL — ABNORMAL HIGH (ref 0.3–1.2)
Total Protein: 7.7 g/dL (ref 6.5–8.1)

## 2021-02-07 LAB — LIPASE, BLOOD: Lipase: 633 U/L — ABNORMAL HIGH (ref 11–51)

## 2021-02-07 NOTE — ED Triage Notes (Addendum)
C/o severe abd pain with nausea and dark urine,   x 1 week, pt reports weight loss over last month

## 2021-02-07 NOTE — ED Notes (Signed)
Pt is aware he needs a urine sample 

## 2021-02-08 ENCOUNTER — Encounter (HOSPITAL_BASED_OUTPATIENT_CLINIC_OR_DEPARTMENT_OTHER): Payer: Self-pay | Admitting: Emergency Medicine

## 2021-02-08 ENCOUNTER — Inpatient Hospital Stay (HOSPITAL_COMMUNITY): Payer: Medicare Other

## 2021-02-08 DIAGNOSIS — R188 Other ascites: Secondary | ICD-10-CM | POA: Diagnosis present

## 2021-02-08 DIAGNOSIS — R634 Abnormal weight loss: Secondary | ICD-10-CM | POA: Diagnosis present

## 2021-02-08 DIAGNOSIS — K852 Alcohol induced acute pancreatitis without necrosis or infection: Secondary | ICD-10-CM | POA: Diagnosis present

## 2021-02-08 DIAGNOSIS — D539 Nutritional anemia, unspecified: Secondary | ICD-10-CM | POA: Diagnosis present

## 2021-02-08 DIAGNOSIS — K5730 Diverticulosis of large intestine without perforation or abscess without bleeding: Secondary | ICD-10-CM | POA: Diagnosis present

## 2021-02-08 DIAGNOSIS — Z20822 Contact with and (suspected) exposure to covid-19: Secondary | ICD-10-CM | POA: Diagnosis present

## 2021-02-08 DIAGNOSIS — D696 Thrombocytopenia, unspecified: Secondary | ICD-10-CM | POA: Diagnosis present

## 2021-02-08 DIAGNOSIS — K831 Obstruction of bile duct: Secondary | ICD-10-CM | POA: Diagnosis present

## 2021-02-08 DIAGNOSIS — I10 Essential (primary) hypertension: Secondary | ICD-10-CM | POA: Diagnosis present

## 2021-02-08 DIAGNOSIS — K824 Cholesterolosis of gallbladder: Secondary | ICD-10-CM | POA: Diagnosis present

## 2021-02-08 DIAGNOSIS — F17210 Nicotine dependence, cigarettes, uncomplicated: Secondary | ICD-10-CM | POA: Diagnosis present

## 2021-02-08 DIAGNOSIS — K219 Gastro-esophageal reflux disease without esophagitis: Secondary | ICD-10-CM | POA: Diagnosis present

## 2021-02-08 DIAGNOSIS — R945 Abnormal results of liver function studies: Secondary | ICD-10-CM | POA: Diagnosis not present

## 2021-02-08 DIAGNOSIS — Z79899 Other long term (current) drug therapy: Secondary | ICD-10-CM | POA: Diagnosis not present

## 2021-02-08 DIAGNOSIS — Z681 Body mass index (BMI) 19 or less, adult: Secondary | ICD-10-CM | POA: Diagnosis not present

## 2021-02-08 DIAGNOSIS — E876 Hypokalemia: Secondary | ICD-10-CM | POA: Diagnosis not present

## 2021-02-08 DIAGNOSIS — E871 Hypo-osmolality and hyponatremia: Secondary | ICD-10-CM | POA: Diagnosis present

## 2021-02-08 LAB — CBC WITH DIFFERENTIAL/PLATELET
Abs Immature Granulocytes: 0.07 10*3/uL (ref 0.00–0.07)
Basophils Absolute: 0 10*3/uL (ref 0.0–0.1)
Basophils Relative: 0 %
Eosinophils Absolute: 0.1 10*3/uL (ref 0.0–0.5)
Eosinophils Relative: 1 %
HCT: 34.3 % — ABNORMAL LOW (ref 39.0–52.0)
Hemoglobin: 12.4 g/dL — ABNORMAL LOW (ref 13.0–17.0)
Immature Granulocytes: 1 %
Lymphocytes Relative: 16 %
Lymphs Abs: 1.1 10*3/uL (ref 0.7–4.0)
MCH: 36.8 pg — ABNORMAL HIGH (ref 26.0–34.0)
MCHC: 36.2 g/dL — ABNORMAL HIGH (ref 30.0–36.0)
MCV: 101.8 fL — ABNORMAL HIGH (ref 80.0–100.0)
Monocytes Absolute: 0.7 10*3/uL (ref 0.1–1.0)
Monocytes Relative: 10 %
Neutro Abs: 5.2 10*3/uL (ref 1.7–7.7)
Neutrophils Relative %: 72 %
Platelets: 139 10*3/uL — ABNORMAL LOW (ref 150–400)
RBC: 3.37 MIL/uL — ABNORMAL LOW (ref 4.22–5.81)
RDW: 15 % (ref 11.5–15.5)
WBC: 7.2 10*3/uL (ref 4.0–10.5)
nRBC: 0 % (ref 0.0–0.2)

## 2021-02-08 LAB — URINALYSIS, ROUTINE W REFLEX MICROSCOPIC
Glucose, UA: 100 mg/dL — AB
Ketones, ur: 15 mg/dL — AB
Nitrite: POSITIVE — AB
Protein, ur: >300 mg/dL — AB
Specific Gravity, Urine: 1.02 (ref 1.005–1.030)
pH: 6.5 (ref 5.0–8.0)

## 2021-02-08 LAB — URINALYSIS, MICROSCOPIC (REFLEX)

## 2021-02-08 LAB — COMPREHENSIVE METABOLIC PANEL
ALT: 98 U/L — ABNORMAL HIGH (ref 0–44)
AST: 102 U/L — ABNORMAL HIGH (ref 15–41)
Albumin: 2.7 g/dL — ABNORMAL LOW (ref 3.5–5.0)
Alkaline Phosphatase: 135 U/L — ABNORMAL HIGH (ref 38–126)
Anion gap: 9 (ref 5–15)
BUN: 31 mg/dL — ABNORMAL HIGH (ref 8–23)
CO2: 27 mmol/L (ref 22–32)
Calcium: 8.5 mg/dL — ABNORMAL LOW (ref 8.9–10.3)
Chloride: 101 mmol/L (ref 98–111)
Creatinine, Ser: 0.73 mg/dL (ref 0.61–1.24)
GFR, Estimated: >60 mL/min (ref >60)
Glucose, Bld: 103 mg/dL — ABNORMAL HIGH (ref 70–99)
Potassium: 3 mmol/L — ABNORMAL LOW (ref 3.5–5.1)
Sodium: 137 mmol/L (ref 135–145)
Total Bilirubin: 12.2 mg/dL — ABNORMAL HIGH (ref 0.3–1.2)
Total Protein: 6.3 g/dL — ABNORMAL LOW (ref 6.5–8.1)

## 2021-02-08 LAB — TSH: TSH: 1.091 u[IU]/mL (ref 0.350–4.500)

## 2021-02-08 LAB — PHOSPHORUS: Phosphorus: 3.7 mg/dL (ref 2.5–4.6)

## 2021-02-08 LAB — LIPASE, BLOOD: Lipase: 255 U/L — ABNORMAL HIGH (ref 11–51)

## 2021-02-08 LAB — MAGNESIUM: Magnesium: 1.7 mg/dL (ref 1.7–2.4)

## 2021-02-08 LAB — RESP PANEL BY RT-PCR (FLU A&B, COVID) ARPGX2
Influenza A by PCR: NEGATIVE
Influenza B by PCR: NEGATIVE
SARS Coronavirus 2 by RT PCR: NEGATIVE

## 2021-02-08 MED ORDER — HYDRALAZINE HCL 20 MG/ML IJ SOLN: 10.0000 mg | Freq: Four times a day (QID) | INTRAMUSCULAR | Status: DC | PRN

## 2021-02-08 MED ORDER — POTASSIUM CHLORIDE 10 MEQ/100ML IV SOLN
10.0000 meq | INTRAVENOUS | Status: AC
  Administered 2021-02-08 (×4): 10 meq via INTRAVENOUS
  Filled 2021-02-08 (×4): qty 100

## 2021-02-08 MED ORDER — OXYCODONE HCL 5 MG PO TABS
5.0000 mg | ORAL_TABLET | ORAL | Status: DC | PRN
  Administered 2021-02-08 – 2021-02-09 (×2): 5 mg via ORAL
  Filled 2021-02-08 (×2): qty 1

## 2021-02-08 MED ORDER — ACETAMINOPHEN 650 MG RE SUPP: 650.0000 mg | Freq: Four times a day (QID) | RECTAL | Status: DC | PRN

## 2021-02-08 MED ORDER — FENTANYL CITRATE (PF) 100 MCG/2ML IJ SOLN
50.0000 ug | INTRAMUSCULAR | Status: DC | PRN
  Administered 2021-02-08 – 2021-02-09 (×6): 50 ug via INTRAVENOUS
  Filled 2021-02-08 (×6): qty 2

## 2021-02-08 MED ORDER — LORAZEPAM 2 MG/ML IJ SOLN: 0.0000 mg | Freq: Two times a day (BID) | INTRAMUSCULAR | Status: DC

## 2021-02-08 MED ORDER — FOLIC ACID 1 MG PO TABS
1.0000 mg | ORAL_TABLET | Freq: Every day | ORAL | Status: DC
  Administered 2021-02-08 – 2021-02-12 (×5): 1 mg via ORAL
  Filled 2021-02-08 (×5): qty 1

## 2021-02-08 MED ORDER — POLYETHYLENE GLYCOL 3350 17 G PO PACK: 17.0000 g | PACK | Freq: Every day | ORAL | Status: DC | PRN

## 2021-02-08 MED ORDER — ONDANSETRON HCL 4 MG PO TABS: 4.0000 mg | ORAL_TABLET | Freq: Four times a day (QID) | ORAL | Status: DC | PRN

## 2021-02-08 MED ORDER — ACETAMINOPHEN 325 MG PO TABS: 650.0000 mg | ORAL_TABLET | Freq: Four times a day (QID) | ORAL | Status: DC | PRN

## 2021-02-08 MED ORDER — ONDANSETRON HCL 4 MG/2ML IJ SOLN: 4.0000 mg | Freq: Four times a day (QID) | INTRAMUSCULAR | Status: DC | PRN

## 2021-02-08 MED ORDER — THIAMINE HCL 100 MG/ML IJ SOLN: 100.0000 mg | Freq: Every day | INTRAMUSCULAR | Status: DC

## 2021-02-08 MED ORDER — PANTOPRAZOLE SODIUM 40 MG IV SOLR
40.0000 mg | INTRAVENOUS | Status: DC
  Administered 2021-02-08 – 2021-02-11 (×4): 40 mg via INTRAVENOUS
  Filled 2021-02-08 (×4): qty 40

## 2021-02-08 MED ORDER — ENOXAPARIN SODIUM 40 MG/0.4ML IJ SOSY
40.0000 mg | PREFILLED_SYRINGE | INTRAMUSCULAR | Status: DC
  Administered 2021-02-08 – 2021-02-11 (×4): 40 mg via SUBCUTANEOUS
  Filled 2021-02-08 (×5): qty 0.4

## 2021-02-08 MED ORDER — FENTANYL CITRATE (PF) 100 MCG/2ML IJ SOLN
50.0000 ug | Freq: Once | INTRAMUSCULAR | Status: AC
  Administered 2021-02-08: 50 ug via INTRAVENOUS
  Filled 2021-02-08: qty 2

## 2021-02-08 MED ORDER — LORAZEPAM 2 MG/ML IJ SOLN: 0.0000 mg | Freq: Four times a day (QID) | INTRAMUSCULAR | Status: AC

## 2021-02-08 MED ORDER — LORAZEPAM 1 MG PO TABS: 1.0000 mg | ORAL_TABLET | ORAL | Status: AC | PRN

## 2021-02-08 MED ORDER — THIAMINE HCL 100 MG PO TABS
100.0000 mg | ORAL_TABLET | Freq: Every day | ORAL | Status: DC
  Administered 2021-02-08 – 2021-02-12 (×5): 100 mg via ORAL
  Filled 2021-02-08 (×5): qty 1

## 2021-02-08 MED ORDER — MAGNESIUM SULFATE 2 GM/50ML IV SOLN
2.0000 g | Freq: Once | INTRAVENOUS | Status: AC
  Administered 2021-02-08: 2 g via INTRAVENOUS
  Filled 2021-02-08: qty 50

## 2021-02-08 MED ORDER — SODIUM CHLORIDE 0.9 % IV SOLN: INTRAVENOUS | Status: DC

## 2021-02-08 MED ORDER — BISACODYL 10 MG RE SUPP: 10.0000 mg | Freq: Every day | RECTAL | Status: DC | PRN

## 2021-02-08 MED ORDER — LORAZEPAM 2 MG/ML IJ SOLN: 1.0000 mg | INTRAMUSCULAR | Status: AC | PRN

## 2021-02-08 MED ORDER — ADULT MULTIVITAMIN W/MINERALS CH
1.0000 | ORAL_TABLET | Freq: Every day | ORAL | Status: DC
  Administered 2021-02-08 – 2021-02-12 (×5): 1 via ORAL
  Filled 2021-02-08 (×5): qty 1

## 2021-02-08 MED ORDER — SODIUM CHLORIDE 0.9 % IV BOLUS
1000.0000 mL | Freq: Once | INTRAVENOUS | Status: AC
  Administered 2021-02-08: 1000 mL via INTRAVENOUS

## 2021-02-08 NOTE — ED Provider Notes (Signed)
[facility] EMERGENCY DEPARTMENT Provider Note   CSN: [id number] Arrival date & time: 02/07/21  2116     History Chief Complaint  Patient presents with  . Abdominal Pain    Jared Duran is a 70 y.o. male.  The history is provided by the patient.  Abdominal Pain Pain location:  Generalized Pain quality: cramping   Pain severity:  Severe Onset quality:  Gradual Progression:  Worsening Chronicity:  Recurrent Context: alcohol use   Context: not recent travel   Relieved by:  Nothing Worsened by:  Nothing Ineffective treatments:  None tried Associated symptoms: nausea   Associated symptoms: no anorexia, no chest pain, no cough, no diarrhea, no fever and no shortness of breath   Associated symptoms comment:  Dark urine and weight loss but patient cannot say how much he has lost.   Risk factors: alcohol abuse   Risk factors: not obese   Patient is a 5 beer a day drinker who presents with abdominal pain and weight loss and dark urine for a month.  No f/c/r.       Past Medical History:  Diagnosis Date  . Hypertension   . Pancreatitis     Patient Active Problem List   Diagnosis Date Noted  . Fatty liver, alcoholic 04/12/2020  . Benign essential HTN 04/12/2020  . Alcohol abuse 04/11/2020  . Acute pancreatitis 04/10/2020  . Acute alcoholic pancreatitis 04/10/2020    History reviewed. No pertinent surgical history.     History reviewed. No pertinent family history.  Social History   Tobacco Use  . Smoking status: Current Every Day Smoker    Packs/day: 1.00    Types: Cigarettes  . Smokeless tobacco: Never Used  Substance Use Topics  . Alcohol use: Yes  . Drug use: No    Home Medications Prior to Admission medications   Medication Sig Start Date End Date Taking? Authorizing Provider  folic acid (FOLVITE) 1 MG tablet Take 1 tablet (1 mg total) by mouth daily. 04/13/20   Calvert Cantor, MD  losartan (COZAAR) 100 MG tablet Take 100 mg by mouth daily.  02/03/20   [provider]  Multiple Vitamin (MULTIVITAMIN WITH MINERALS) TABS tablet Take 1 tablet by mouth daily. 04/13/20   Calvert Cantor, MD  NIFEdipine (PROCARDIA) 20 MG capsule Take 1 capsule (20 mg total) by mouth 3 (three) times daily. Patient taking differently: Take 20 mg by mouth daily.  10/14/18   Renne Crigler, PA-C  thiamine 100 MG tablet Take 1 tablet (100 mg total) by mouth daily. 04/13/20   Calvert Cantor, MD    Allergies    Patient has no known allergies.  Review of Systems   Review of Systems  Constitutional: Positive for unexpected weight change. Negative for fever.  HENT: Negative for congestion.   Eyes: Negative for redness.  Respiratory: Negative for cough and shortness of breath.   Cardiovascular: Negative for chest pain.  Gastrointestinal: Positive for abdominal pain and nausea. Negative for anorexia and diarrhea.  Genitourinary: Negative for difficulty urinating.  Musculoskeletal: Negative for arthralgias.  Skin: Positive for color change.  Neurological: Negative for facial asymmetry.  Psychiatric/Behavioral: Negative for agitation.    Physical Exam Updated Vital Signs BP (!) 147/109   Pulse 89   Temp (!) 97.4 F (36.3 C) (Oral)   Resp 20   Ht 5\' 9"  (1.753 m)   Wt 59.2 kg   SpO2 97%   BMI 19.27 kg/m   Physical Exam Vitals and nursing note reviewed.  Constitutional:      Appearance: Normal appearance. He is not diaphoretic.  HENT:     Head: Normocephalic and atraumatic.     Nose: Nose normal.  Eyes:     Pupils: Pupils are equal, round, and reactive to light.     Comments: icterus  Cardiovascular:     Rate and Rhythm: Normal rate and regular rhythm.     Pulses: Normal pulses.     Heart sounds: Normal heart sounds.  Pulmonary:     Effort: Pulmonary effort is normal.     Breath sounds: Normal breath sounds.  Abdominal:     General: Bowel sounds are normal.     Palpations: Abdomen is soft.     Tenderness: There is no abdominal  tenderness. There is no guarding or rebound.  Musculoskeletal:        General: Normal range of motion.     Cervical back: Normal range of motion and neck supple.  Skin:    General: Skin is warm and dry.     Capillary Refill: Capillary refill takes less than 2 seconds.  Neurological:     General: No focal deficit present.     Mental Status: He is alert and oriented to person, place, and time.     Deep Tendon Reflexes: Reflexes normal.  Psychiatric:        Mood and Affect: Mood normal.        Behavior: Behavior normal.     ED Results / Procedures / Treatments   Labs (all labs ordered are listed, but only abnormal results are displayed) Results for orders placed or performed during the hospital encounter of 02/07/21  Resp Panel by RT-PCR (Flu A&B, Covid) Nasopharyngeal Swab   Specimen: Nasopharyngeal Swab; Nasopharyngeal(NP) swabs in vial transport medium  Result Value Ref Range   SARS Coronavirus 2 by RT PCR NEGATIVE NEGATIVE   Influenza A by PCR NEGATIVE NEGATIVE   Influenza B by PCR NEGATIVE NEGATIVE  Comprehensive metabolic panel  Result Value Ref Range   Sodium 132 (L) 135 - 145 mmol/L   Potassium 3.7 3.5 - 5.1 mmol/L   Chloride 92 (L) 98 - 111 mmol/L   CO2 26 22 - 32 mmol/L   Glucose, Bld 156 (H) 70 - 99 mg/dL   BUN 34 (H) 8 - 23 mg/dL   Creatinine, Ser 7.16 0.61 - 1.24 mg/dL   Calcium 9.3 8.9 - 96.7 mg/dL   Total Protein 7.7 6.5 - 8.1 g/dL   Albumin 3.5 3.5 - 5.0 g/dL   AST 893 (H) 15 - 41 U/L   ALT 135 (H) 0 - 44 U/L   Alkaline Phosphatase 168 (H) 38 - 126 U/L   Total Bilirubin 13.1 (H) 0.3 - 1.2 mg/dL   GFR, Estimated >81 >01 mL/min   Anion gap 14 5 - 15  Lipase, blood  Result Value Ref Range   Lipase 633 (H) 11 - 51 U/L  CBC with Diff  Result Value Ref Range   WBC 9.6 4.0 - 10.5 K/uL   RBC 4.08 (L) 4.22 - 5.81 MIL/uL   Hemoglobin 15.2 13.0 - 17.0 g/dL   HCT 75.1 02.5 - 85.2 %   MCV 101.0 (H) 80.0 - 100.0 fL   MCH 37.3 (H) 26.0 - 34.0 pg   MCHC 36.9 (H)  30.0 - 36.0 g/dL   RDW 77.8 24.2 - 35.3 %   Platelets 182 150 - 400 K/uL   nRBC 0.0 0.0 - 0.2 %   Neutrophils Relative %  74 %   Neutro Abs 7.1 1.7 - 7.7 K/uL   Lymphocytes Relative 14 %   Lymphs Abs 1.4 0.7 - 4.0 K/uL   Monocytes Relative 10 %   Monocytes Absolute 1.0 0.1 - 1.0 K/uL   Eosinophils Relative 1 %   Eosinophils Absolute 0.1 0.0 - 0.5 K/uL   Basophils Relative 0 %   Basophils Absolute 0.0 0.0 - 0.1 K/uL   Immature Granulocytes 1 %   Abs Immature Granulocytes 0.07 0.00 - 0.07 K/uL   CT Renal Stone Study  Result Date: 02/07/2021 CLINICAL DATA:  Flank pain. Severe abdominal pain with nausea. Weight loss. EXAM: CT ABDOMEN AND PELVIS WITHOUT CONTRAST TECHNIQUE: Multidetector CT imaging of the abdomen and pelvis was performed following the standard protocol without IV contrast. COMPARISON:  Most recent abdominal CT 04/10/2020 FINDINGS: Lower chest: No acute airspace disease or pleural effusion. Normal heart size. Hepatobiliary: Scattered small hepatic hypodensities are similar to prior exam, likely cysts. Gallbladder physiologically distended, no calcified stone. No biliary dilatation. Pancreas: Peripancreatic edema extending from the head/uncinate process through the body. No definite ductal dilatation or focal fluid collection. Small amount of ill-defined retroperitoneal fluid. Spleen: Normal in size without focal abnormality. Adrenals/Urinary Tract: No adrenal nodule. No hydronephrosis or renal calculi. Mild left greater than right perinephric edema. Small cyst in the right upper kidney. Decompressed ureters. Minimally distended urinary bladder. Mild wall thickening is likely due to degree of distension. Stomach/Bowel: Decompressed stomach. No small bowel obstruction or inflammation. Fecalization of distal small bowel contents. Normal appendix tentatively visualized. Small to moderate colonic stool burden. There is colonic tortuosity. Descending and sigmoid diverticulosis without  diverticulitis. Vascular/Lymphatic: Moderate aortic atherosclerosis. No aortic aneurysm. No portal venous or mesenteric gas. No bulky abdominopelvic adenopathy. Reproductive: Occasional prostatic calcifications. Other: No free air. Peripancreatic fat stranding and edema with small amount of free fluid. No other ascites. Tiny fat containing umbilical hernia. Musculoskeletal: There are no acute or suspicious osseous abnormalities. IMPRESSION: 1. Peripancreatic edema and fat stranding consistent with acute pancreatitis. No focal fluid collection. 2. No renal stones or obstructive uropathy. 3. Colonic diverticulosis without diverticulitis. Aortic Atherosclerosis (ICD10-I70.0). Electronically Signed   By: Narda Rutherford M.D.   On: 02/07/2021 23:28    EKG None  Radiology CT Renal Stone Study  Result Date: 02/07/2021 CLINICAL DATA:  Flank pain. Severe abdominal pain with nausea. Weight loss. EXAM: CT ABDOMEN AND PELVIS WITHOUT CONTRAST TECHNIQUE: Multidetector CT imaging of the abdomen and pelvis was performed following the standard protocol without IV contrast. COMPARISON:  Most recent abdominal CT 04/10/2020 FINDINGS: Lower chest: No acute airspace disease or pleural effusion. Normal heart size. Hepatobiliary: Scattered small hepatic hypodensities are similar to prior exam, likely cysts. Gallbladder physiologically distended, no calcified stone. No biliary dilatation. Pancreas: Peripancreatic edema extending from the head/uncinate process through the body. No definite ductal dilatation or focal fluid collection. Small amount of ill-defined retroperitoneal fluid. Spleen: Normal in size without focal abnormality. Adrenals/Urinary Tract: No adrenal nodule. No hydronephrosis or renal calculi. Mild left greater than right perinephric edema. Small cyst in the right upper kidney. Decompressed ureters. Minimally distended urinary bladder. Mild wall thickening is likely due to degree of distension. Stomach/Bowel:  Decompressed stomach. No small bowel obstruction or inflammation. Fecalization of distal small bowel contents. Normal appendix tentatively visualized. Small to moderate colonic stool burden. There is colonic tortuosity. Descending and sigmoid diverticulosis without diverticulitis. Vascular/Lymphatic: Moderate aortic atherosclerosis. No aortic aneurysm. No portal venous or mesenteric gas. No bulky abdominopelvic adenopathy. Reproductive: Occasional  prostatic calcifications. Other: No free air. Peripancreatic fat stranding and edema with small amount of free fluid. No other ascites. Tiny fat containing umbilical hernia. Musculoskeletal: There are no acute or suspicious osseous abnormalities. IMPRESSION: 1. Peripancreatic edema and fat stranding consistent with acute pancreatitis. No focal fluid collection. 2. No renal stones or obstructive uropathy. 3. Colonic diverticulosis without diverticulitis. Aortic Atherosclerosis (ICD10-I70.0). Electronically Signed   By: Narda RutherfordMelanie  Sanford M.D.   On: 02/07/2021 23:28    Procedures Procedures   Medications Ordered in ED Medications  fentaNYL (SUBLIMAZE) injection 50 mcg (has no administration in time range)    ED Course  I have reviewed the triage vital signs and the nursing notes.  Pertinent labs & imaging results that were available during my care of the patient were reviewed by me and considered in my medical decision making (see chart for details).    Given the weight loss I am concerned for underlying malignancy.  No signs on the scan but will need further work up as an inpatient.    Princess PernaWilliam Reek was evaluated in Emergency Department on 02/08/2021 for the symptoms described in the history of present illness. He was evaluated in the context of the global COVID-19 pandemic, which necessitated consideration that the patient might be at risk for infection with the SARS-CoV-2 virus that causes COVID-19. Institutional protocols and algorithms that pertain to  the evaluation of patients at risk for COVID-19 are in a state of rapid change based on information released by regulatory bodies including the CDC and federal and state organizations. These policies and algorithms were followed during the patient's care in the ED.  Final Clinical Impression(s) / ED Diagnoses Final diagnoses:  Acute pancreatitis, unspecified complication status, unspecified pancreatitis type  Hyperbilirubinemia   Admit to medicine    Cymone Yeske, MD 02/08/21 [id number]

## 2021-02-08 NOTE — Plan of Care (Signed)

## 2021-02-08 NOTE — H&P (Signed)
History and Physical    Jared PernaWilliam Duran ZOX:[id number] DOB: 11-06-1950 DOA: 02/07/2021  PCP: Brayton Elrosinis, Donna J, PA-C   Patient coming from: Home  Chief Complaint: Abdominal pain and weight loss  HPI: Jared PernaWilliam Duran is a 70 y.o. male with medical history significant for but not limited too hypertension, history of pancreatitis, alcohol abuse as well as other comorbidities who presented with a 1 week worsening abdominal discomfort, dark urine and a recent history of rapid unintentional weight loss.  Patient states that he started feeling bad last week after cutting 5 lawns and started having abdominal pain then.  He states that he drank the day before.  He progressively has had poor p.o. intake and continued to have abdominal pain to the point that he ended up coming here.  He is noted to have elevated LFTs and elevated lipase.  States that this is happened to him in the past before and he states that he drinks 5 beers a day with his last beer being the day before he started having abdominal pain.  He has had darker urine for about a month and did not realize that his eyes were yellow until today.  He denies any recent travel and no chest pain, shortness of breath cough or diarrhea.  He states that he is lost weight but does not know how much.  [facility] was asked to admit this patient for acute alcoholic pancreatitis and elevated hyperbilirubinemia  ED Course: [facility] and was given IV fluid hydration and pain management.  He had basic blood work done and a COVID testing which was negative.  Also had a lipase level which was elevated and a CT renal stone study  Review of Systems: As per HPI otherwise all other systems reviewed and negative.    Past Medical History:  Diagnosis Date  . Hypertension   . Pancreatitis    SURGICAL HISTORY Had Knee Surgery   SOCIAL HISTORY   reports that he has been smoking cigarettes. He has been smoking about 1.00 pack per day. He has  never used smokeless tobacco. He reports current alcohol use. He reports that he does not use drugs.  ALLERGIES No Known Allergies  FAMILY HISTORY Both patient's mother and father had cancer of unknown type  Prior to Admission medications   Medication Sig Start Date End Date Taking? Authorizing Provider  folic acid (FOLVITE) 1 MG tablet Take 1 tablet (1 mg total) by mouth daily. 04/13/20   Calvert Cantorizwan, Saima, MD  losartan (COZAAR) 100 MG tablet Take 100 mg by mouth daily. 02/03/20   [provider]  Multiple Vitamin (MULTIVITAMIN WITH MINERALS) TABS tablet Take 1 tablet by mouth daily. 04/13/20   Calvert Cantorizwan, Saima, MD  NIFEdipine (PROCARDIA) 20 MG capsule Take 1 capsule (20 mg total) by mouth 3 (three) times daily. Patient taking differently: Take 20 mg by mouth daily.  10/14/18   Renne CriglerGeiple, Joshua, PA-C  thiamine 100 MG tablet Take 1 tablet (100 mg total) by mouth daily. 04/13/20   Calvert Cantorizwan, Saima, MD    Physical Exam: Vitals:   02/08/21 1100 02/08/21 1130 02/08/21 1400 02/08/21 1430  BP: (!) 141/101 (!) 142/95 (!) 159/103 (!) 155/102  Pulse:  73  76  Resp: 20 16 20 17   Temp:      TempSrc:      SpO2: 98% 98%  93%  Weight:      Height:       Constitutional: Thin African-American male currently no acute distress  Eyes: Lids are normal but sclerae are significantly icteric ENMT: External Ears, Nose appear normal. Grossly normal hearing. Neck: Appears normal, supple, no cervical masses, normal ROM, no appreciable thyromegaly; no JVD Respiratory: Diminished to auscultation bilaterally, no wheezing, rales, rhonchi or crackles. Normal respiratory effort and patient is not tachypenic. No accessory muscle use.  Unlabored breathing Cardiovascular: RRR, no murmurs / rubs / gallops. S1 and S2 auscultated.  No extremity edema Abdomen: Soft, non-tender, non-distended. No masses palpated.  GU: He has a right-sided inguinal hernia Musculoskeletal: No clubbing / cyanosis of digits/nails. No joint deformity  upper and lower extremities.  Skin: No rashes, lesions, ulcers on limited skin evaluation. No induration; Warm and dry.  Neurologic: CN 2-12 grossly intact with no focal deficits. Romberg sign and cerebellar reflexes not assessed.  Psychiatric: Normal judgment and insight. Alert and oriented x 3. Normal mood and appropriate affect.   Labs on Admission: I have personally reviewed following labs and imaging studies  CBC: Recent Labs  Lab 02/07/21 2215  WBC 9.6  NEUTROABS 7.1  HGB 15.2  HCT 41.2  MCV 101.0*  PLT 182   Basic Metabolic Panel: Recent Labs  Lab 02/07/21 2215  NA 132*  K 3.7  CL 92*  CO2 26  GLUCOSE 156*  BUN 34*  CREATININE 1.18  CALCIUM 9.3   GFR: Estimated Creatinine Clearance: 50.9 mL/min (by C-G formula based on SCr of 1.18 mg/dL). Liver Function Tests: Recent Labs  Lab 02/07/21 2215  AST 162*  ALT 135*  ALKPHOS 168*  BILITOT 13.1*  PROT 7.7  ALBUMIN 3.5   Recent Labs  Lab 02/07/21 2215  LIPASE 633*   No results for input(s): AMMONIA in the last 168 hours. Coagulation Profile: No results for input(s): INR, PROTIME in the last 168 hours. Cardiac Enzymes: No results for input(s): CKTOTAL, CKMB, CKMBINDEX, TROPONINI in the last 168 hours. BNP (last 3 results) No results for input(s): PROBNP in the last 8760 hours. HbA1C: No results for input(s): HGBA1C in the last 72 hours. CBG: No results for input(s): GLUCAP in the last 168 hours. Lipid Profile: No results for input(s): CHOL, HDL, LDLCALC, TRIG, CHOLHDL, LDLDIRECT in the last 72 hours. Thyroid Function Tests: No results for input(s): TSH, T4TOTAL, FREET4, T3FREE, THYROIDAB in the last 72 hours. Anemia Panel: No results for input(s): VITAMINB12, FOLATE, FERRITIN, TIBC, IRON, RETICCTPCT in the last 72 hours. Urine analysis:    Component Value Date/Time   COLORURINE BROWN (A) 02/08/2021 0222   APPEARANCEUR CLEAR 02/08/2021 0222   LABSPEC 1.020 02/08/2021 0222   PHURINE 6.5 02/08/2021  0222   GLUCOSEU 100 (A) 02/08/2021 0222   HGBUR TRACE (A) 02/08/2021 0222   BILIRUBINUR LARGE (A) 02/08/2021 0222   KETONESUR 15 (A) 02/08/2021 0222   PROTEINUR >300 (A) 02/08/2021 0222   NITRITE POSITIVE (A) 02/08/2021 0222   LEUKOCYTESUR TRACE (A) 02/08/2021 0222   Sepsis Labs: !!!!!!!!!!!!!!!!!!!!!!!!!!!!!!!!!!!!!!!!!!!! @LABRCNTIP (procalcitonin:4,lacticidven:4) ) Recent Results (from the past 240 hour(s))  Resp Panel by RT-PCR (Flu A&B, Covid) Nasopharyngeal Swab     Status: None   Collection Time: 02/07/21 11:45 PM   Specimen: Nasopharyngeal Swab; Nasopharyngeal(NP) swabs in vial transport medium  Result Value Ref Range Status   SARS Coronavirus 2 by RT PCR NEGATIVE NEGATIVE Final    Comment: (NOTE) SARS-CoV-2 target nucleic acids are NOT DETECTED.  The SARS-CoV-2 RNA is generally detectable in upper respiratory specimens during the acute phase of infection. The lowest concentration of SARS-CoV-2 viral copies this assay can detect is 138 copies/mL.  A negative result does not preclude SARS-Cov-2 infection and should not be used as the sole basis for treatment or other patient management decisions. A negative result may occur with  improper specimen collection/handling, submission of specimen other than nasopharyngeal swab, presence of viral mutation(s) within the areas targeted by this assay, and inadequate number of viral copies(<138 copies/mL). A negative result must be combined with clinical observations, patient history, and epidemiological information. The expected result is Negative.  Fact Sheet for Patients:  BloggerCourse.com  Fact Sheet for Healthcare Providers:  SeriousBroker.it  This test is no t yet approved or cleared by the [place] FDA and  has been authorized for detection and/or diagnosis of SARS-CoV-2 by FDA under an Emergency Use Authorization (EUA). This EUA will remain  in effect (meaning this  test can be used) for the duration of the COVID-19 declaration under Section 564(b)(1) of the Act, 21 U.S.C.section 360bbb-3(b)(1), unless the authorization is terminated  or revoked sooner.       Influenza A by PCR NEGATIVE NEGATIVE Final   Influenza B by PCR NEGATIVE NEGATIVE Final    Comment: (NOTE) The Xpert Xpress SARS-CoV-2/FLU/RSV plus assay is intended as an aid in the diagnosis of influenza from Nasopharyngeal swab specimens and should not be used as a sole basis for treatment. Nasal washings and aspirates are unacceptable for Xpert Xpress SARS-CoV-2/FLU/RSV testing.  Fact Sheet for Patients: BloggerCourse.com  Fact Sheet for Healthcare Providers: SeriousBroker.it  This test is not yet approved or cleared by the [place] FDA and has been authorized for detection and/or diagnosis of SARS-CoV-2 by FDA under an Emergency Use Authorization (EUA). This EUA will remain in effect (meaning this test can be used) for the duration of the COVID-19 declaration under Section 564(b)(1) of the Act, 21 U.S.C. section 360bbb-3(b)(1), unless the authorization is terminated or revoked.  Performed at [facility], [address] [place], [place] [address]      Radiological Exams on Admission: CT Renal Stone Study  Result Date: 02/07/2021 CLINICAL DATA:  Flank pain. Severe abdominal pain with nausea. Weight loss. EXAM: CT ABDOMEN AND PELVIS WITHOUT CONTRAST TECHNIQUE: Multidetector CT imaging of the abdomen and pelvis was performed following the standard protocol without IV contrast. COMPARISON:  Most recent abdominal CT 04/10/2020 FINDINGS: Lower chest: No acute airspace disease or pleural effusion. Normal heart size. Hepatobiliary: Scattered small hepatic hypodensities are similar to prior exam, likely cysts. Gallbladder physiologically distended, no calcified stone. No biliary dilatation. Pancreas: Peripancreatic edema  extending from the head/uncinate process through the body. No definite ductal dilatation or focal fluid collection. Small amount of ill-defined retroperitoneal fluid. Spleen: Normal in size without focal abnormality. Adrenals/Urinary Tract: No adrenal nodule. No hydronephrosis or renal calculi. Mild left greater than right perinephric edema. Small cyst in the right upper kidney. Decompressed ureters. Minimally distended urinary bladder. Mild wall thickening is likely due to degree of distension. Stomach/Bowel: Decompressed stomach. No small bowel obstruction or inflammation. Fecalization of distal small bowel contents. Normal appendix tentatively visualized. Small to moderate colonic stool burden. There is colonic tortuosity. Descending and sigmoid diverticulosis without diverticulitis. Vascular/Lymphatic: Moderate aortic atherosclerosis. No aortic aneurysm. No portal venous or mesenteric gas. No bulky abdominopelvic adenopathy. Reproductive: Occasional prostatic calcifications. Other: No free air. Peripancreatic fat stranding and edema with small amount of free fluid. No other ascites. Tiny fat containing umbilical hernia. Musculoskeletal: There are no acute or suspicious osseous abnormalities. IMPRESSION: 1. Peripancreatic edema and fat stranding consistent with acute pancreatitis. No focal  fluid collection. 2. No renal stones or obstructive uropathy. 3. Colonic diverticulosis without diverticulitis. Aortic Atherosclerosis (ICD10-I70.0). Electronically Signed   By: Narda Rutherford M.D.   On: 02/07/2021 23:28   EKG: No EKG done on Admission so will order one now  Assessment/Plan Active Problems:   Alcoholic pancreatitis   Acute Alcoholic Pancreatitis  Abnormal LFTs Hyperbilirubinemia  -Presented with Abdominal Pain x1 week -His lipase level on admission was 633 and trended down to 255 -CT renal stone study showed "Peripancreatic edema and fat stranding consistent with acute pancreatitis. No focal  fluid collection. No renal stones or obstructive uropathy.  Colonic diverticulosis without diverticulitis" -AST was under 62 and trended down to 102 and ALT went from 135 is now 98 -Patient's T bili was 13.1 and trended down to 12.2 -We will make the patient n.p.o. and give a fluid bolus of normal saline now -Pain control and maintenance IV fluids with normal saline at 125 mL -We will check a right upper quadrant ultrasound -Given his significantly elevated bilirubin and alcoholic pancreatitis we have consulted GI for further evaluation recommendations  Hypertension -Hold home p.o. medications for now and start IV hydralazine 10 mg every 6 as needed for systolic blood pressure greater than 160 or DBP greater than 100 -Monitor blood pressures per protocol  Hyponatremia -Improved.  Patient sodium went from 132 and is now 137 -Continue monitor and trend and repeat CMP in a.m.  Alcoholism with concern for withdrawal -CIWA and folic acid, multivitamin and thiamine -Monitor for signs and symptoms of withdrawal  Macrocytic Anemia -In the setting of alcohol abuse The patient's hemoglobin/hematocrit went from 15.2/41.2 and was likely hemoconcentrated on admission is now 12.4/34.3 with an MCV of 101.8 -Check anemia panel in the a.m. -Continue to monitor for signs and symptoms of bleeding; currently no overt bleeding noted -Repeat CBC in a.m.  Thromboyctopenia -Patient's Platelet Count went from 182 -> 139 -Continue to Monitor for S/Sx of Bleeding -Repeat CBC in the AM   DVT prophylaxis: Enoxaparin 40 mg sq q24h Code Status: FULL CODE Family Communication: No family present at bedside  Disposition Plan: Pending  Consults called: Gastroenterology  Admission status: Inpatient Telemetry   Severity of Illness: The appropriate patient status for this patient is INPATIENT. Inpatient status is judged to be reasonable and necessary in order to provide the required intensity of service to ensure  the patient's safety. The patient's presenting symptoms, physical exam findings, and initial radiographic and laboratory data in the context of their chronic comorbidities is felt to place them at high risk for further clinical deterioration. Furthermore, it is not anticipated that the patient will be medically stable for discharge from the hospital within 2 midnights of admission. The following factors support the patient status of inpatient.   " The patient's presenting symptoms include Abdominal Pain. " The worrisome physical exam findings include Jaundice. " The initial radiographic and laboratory data are worrisome because of Elevated Bilirubin and Pancreatitis  " The chronic co-morbidities are listed as above   * I certify that at the point of admission it is my clinical judgment that the patient will require inpatient hospital care spanning beyond 2 midnights from the point of admission due to high intensity of service, high risk for further deterioration and high frequency of surveillance required.Merlene Laughter, D.O. [facility] is on AMION  If 7PM-7AM, please contact night-coverage [email or web address]  02/08/2021, 4:57 PM

## 2021-02-09 DIAGNOSIS — D539 Nutritional anemia, unspecified: Secondary | ICD-10-CM

## 2021-02-09 LAB — LIPASE, BLOOD: Lipase: 194 U/L — ABNORMAL HIGH (ref 11–51)

## 2021-02-09 LAB — CBC
HCT: 32.3 % — ABNORMAL LOW (ref 39.0–52.0)
Hemoglobin: 11.7 g/dL — ABNORMAL LOW (ref 13.0–17.0)
MCH: 37 pg — ABNORMAL HIGH (ref 26.0–34.0)
MCHC: 36.2 g/dL — ABNORMAL HIGH (ref 30.0–36.0)
MCV: 102.2 fL — ABNORMAL HIGH (ref 80.0–100.0)
Platelets: 140 10*3/uL — ABNORMAL LOW (ref 150–400)
RBC: 3.16 MIL/uL — ABNORMAL LOW (ref 4.22–5.81)
RDW: 14.9 % (ref 11.5–15.5)
WBC: 6.4 10*3/uL (ref 4.0–10.5)
nRBC: 0 % (ref 0.0–0.2)

## 2021-02-09 LAB — PROTIME-INR
INR: 1 (ref 0.8–1.2)
Prothrombin Time: 12.8 seconds (ref 11.4–15.2)

## 2021-02-09 LAB — COMPREHENSIVE METABOLIC PANEL
ALT: 87 U/L — ABNORMAL HIGH (ref 0–44)
AST: 91 U/L — ABNORMAL HIGH (ref 15–41)
Albumin: 2.6 g/dL — ABNORMAL LOW (ref 3.5–5.0)
Alkaline Phosphatase: 122 U/L (ref 38–126)
Anion gap: 10 (ref 5–15)
BUN: 21 mg/dL (ref 8–23)
CO2: 25 mmol/L (ref 22–32)
Calcium: 8.5 mg/dL — ABNORMAL LOW (ref 8.9–10.3)
Chloride: 103 mmol/L (ref 98–111)
Creatinine, Ser: 0.54 mg/dL — ABNORMAL LOW (ref 0.61–1.24)
GFR, Estimated: >60 mL/min (ref >60)
Glucose, Bld: 80 mg/dL (ref 70–99)
Potassium: 3.7 mmol/L (ref 3.5–5.1)
Sodium: 138 mmol/L (ref 135–145)
Total Bilirubin: 12 mg/dL — ABNORMAL HIGH (ref 0.3–1.2)
Total Protein: 5.7 g/dL — ABNORMAL LOW (ref 6.5–8.1)

## 2021-02-09 LAB — GLUCOSE, CAPILLARY: Glucose-Capillary: 79 mg/dL (ref 70–99)

## 2021-02-09 NOTE — Consult Note (Signed)
[facility] Consultation Note  Referring Provider: [facility] Primary Care Physician:  Drosinis, [place] Readeronna J, PA-C  Reason for Consultation:  pancreatitis  HPI: Princess PernaWilliam Mccallister is a 70 y.o. male with several day history of worsening abdominal pain.  Found to have pancreatitis based both on labs and imaging.  History of alcohol use, and prior pancreatitis, but patient is not especially forthcoming with further information.  He denies blood in stool.  Notes pain is a little better now, in comparison to time of his admission.   Past Medical History:  Diagnosis Date  . Hypertension   . Pancreatitis     History reviewed. No pertinent surgical history.  Prior to Admission medications   Medication Sig Start Date End Date Taking? Authorizing Provider  folic acid (FOLVITE) 1 MG tablet Take 1 tablet (1 mg total) by mouth daily. 04/13/20  Yes Calvert Cantorizwan, Saima, MD  losartan (COZAAR) 100 MG tablet Take 100 mg by mouth daily. 02/03/20  Yes [provider]  NIFEdipine (PROCARDIA XL/NIFEDICAL XL) 60 MG 24 hr tablet Take 60 mg by mouth daily. 11/12/20  Yes [provider]  pantoprazole (PROTONIX) 40 MG tablet Take 40 mg by mouth daily. 11/12/20  Yes [provider]  Multiple Vitamin (MULTIVITAMIN WITH MINERALS) TABS tablet Take 1 tablet by mouth daily. Patient not taking: No sig reported 04/13/20   Calvert Cantorizwan, Saima, MD  NIFEdipine (PROCARDIA) 20 MG capsule Take 1 capsule (20 mg total) by mouth 3 (three) times daily. Patient not taking: Reported on 02/08/2021 10/14/18   Renne CriglerGeiple, Joshua, PA-C  thiamine 100 MG tablet Take 1 tablet (100 mg total) by mouth daily. Patient not taking: No sig reported 04/13/20   Calvert Cantorizwan, Saima, MD    Current Facility-Administered Medications  Medication Dose Route Frequency Provider Last Rate Last Admin  . 0.9 %  sodium chloride infusion   Intravenous Continuous Marguerita MerlesSheikh, Omair PulaskiLatif, [place] 125 mL/hr at 02/09/21 [id number] New Bag at 02/09/21 0951  .  acetaminophen (TYLENOL) tablet 650 mg  650 mg Oral Q6H PRN Marguerita MerlesSheikh, Omair Latif, DO       Or  . acetaminophen (TYLENOL) suppository 650 mg  650 mg Rectal Q6H PRN Marguerita MerlesSheikh, Omair Latif, DO      . bisacodyl (DULCOLAX) suppository 10 mg  10 mg Rectal Daily PRN Marguerita MerlesSheikh, Omair Latif, DO      . enoxaparin (LOVENOX) injection 40 mg  40 mg Subcutaneous Q24H Sheikh, Omair KempLatif, DO   40 mg at 02/08/21 2124  . fentaNYL (SUBLIMAZE) injection 50 mcg  50 mcg Intravenous Q2H PRN Marguerita MerlesSheikh, Omair LeonardLatif, DO   50 mcg at 02/09/21 [id number]  . folic acid (FOLVITE) tablet 1 mg  1 mg Oral Daily Marguerita MerlesSheikh, Omair BarreraLatif, DO   1 mg at 02/09/21 [id number]  . hydrALAZINE (APRESOLINE) injection 10 mg  10 mg Intravenous Q6H PRN Marguerita MerlesSheikh, Omair [place] CrestLatif, DO      . LORazepam (ATIVAN) injection 0-4 mg  0-4 mg Intravenous Q6H Sheikh, Omair OppeloLatif, DO       Followed by  . [START ON 02/10/2021] LORazepam (ATIVAN) injection 0-4 mg  0-4 mg Intravenous Q12H Sheikh, Omair PajaroLatif, DO      . LORazepam (ATIVAN) tablet 1-4 mg  1-4 mg Oral Q1H PRN Marguerita MerlesSheikh, Omair [place]bur[place]Latif, DO       Or  . LORazepam (ATIVAN) injection 1-4 mg  1-4 mg Intravenous Q1H PRN Marguerita MerlesSheikh, Omair Latif, DO      . multivitamin with minerals tablet 1 tablet  1 tablet Oral Daily Marguerita MerlesSheikh, Omair  Latif, DO   1 tablet at 02/09/21 0952  . ondansetron (ZOFRAN) tablet 4 mg  4 mg Oral Q6H PRN Marguerita Merles Latif, DO       Or  . ondansetron [facility]) injection 4 mg  4 mg Intravenous Q6H PRN Marguerita Merles Latif, DO      . oxyCODONE (Oxy IR/ROXICODONE) immediate release tablet 5 mg  5 mg Oral Q4H PRN Marguerita Merles [place]aver[place], DO   5 mg at 02/08/21 1924  . pantoprazole (PROTONIX) injection 40 mg  40 mg Intravenous Q24H Marguerita Merles [place], DO   40 mg at 02/08/21 1917  . polyethylene glycol (MIRALAX / GLYCOLAX) packet 17 g  17 g Oral Daily PRN Marguerita Merles Latif, DO      . thiamine tablet 100 mg  100 mg Oral Daily Marguerita Merles [place], DO   100 mg at 02/09/21 7846   Or  . thiamine (B-1) injection 100 mg  100 mg Intravenous  Daily Marguerita Merles Bar[place], DO        Allergies as of 02/07/2021  . (No Known Allergies)    History reviewed. No pertinent family history.  Social History   Socioeconomic History  . Marital status: Legally Separated    Spouse name: Not on file  . Number of children: Not on file  . Years of education: Not on file  . Highest education level: Not on file  Occupational History  . Not on file  Tobacco Use  . Smoking status: Current Every Day Smoker    Packs/day: 1.00    Types: Cigarettes  . Smokeless tobacco: Never Used  Substance and Sexual Activity  . Alcohol use: Yes  . Drug use: No  . Sexual activity: Not on file  Other Topics Concern  . Not on file  Social History Narrative  . Not on file   Social Determinants of Health   Financial Resource Strain: Not on file  Food Insecurity: Not on file  Transportation Needs: Not on file  Physical Activity: Not on file  Stress: Not on file  Social Connections: Not on file  Intimate Partner Violence: Not on file    Review of Systems: As per HPI, all others negative  Physical Exam: Vital signs in last 24 hours: Temp:  [97.4 F (36.3 C)-98.3 F (36.8 C)] 97.5 F (36.4 C) (06/04 1342) Pulse Rate:  [62-71] 70 (06/04 1342) Resp:  [16-20] 18 (06/04 1342) BP: (141-163)/(94-100) 141/96 (06/04 1342) SpO2:  [92 %-97 %] 97 % (06/04 1342) Weight:  [62 kg] 62 kg (06/04 0500)   General:   Alert,  Thin, somewhat cachectic-appearing, pleasant and cooperative in NAD Head:  Normocephalic and atraumatic. Eyes:  Sclera icteric bilaterally.   Conjunctiva pink. Ears:  Normal auditory acuity. Nose:  No deformity, discharge,  or lesions. Mouth:  No deformity or lesions.  Oropharynx pink & moist. Neck:  Supple; no masses or thyromegaly. Abdomen:  Soft, mild generalized tenderness of epigastrium with some voluntary guarding but without peritonitis;. No masses, hepatosplenomegaly or hernias noted. Normal bowel sounds, without guarding, and  without rebound.     Msk:  Symmetrical without gross deformities. Normal posture. Pulses:  Normal pulses noted. Extremities:  Without clubbing or edema. Neurologic:  Alert and  oriented x4;  grossly normal neurologically. Skin:  Intact without significant lesions or rashes. Psych:  Alert and cooperative. Normal mood and affect.   Lab Results: Recent Labs    02/07/21 2215 02/08/21 1729 02/09/21 0622  WBC 9.6 7.2 6.4  HGB 15.2  12.4* 11.7*  HCT 41.2 34.3* 32.3*  PLT 182 139* 140*   BMET Recent Labs    02/07/21 2215 02/08/21 1729 02/09/21 0622  NA 132* 137 138  K 3.7 3.0* 3.7  CL 92* 101 103  CO2 26 27 25   GLUCOSE 156* 103* 80  BUN 34* 31* 21  CREATININE 1.18 0.73 0.54*  CALCIUM 9.3 8.5* 8.5*   LFT Recent Labs    02/09/21 0622  PROT 5.7*  ALBUMIN 2.6*  AST 91*  ALT 87*  ALKPHOS 122  BILITOT 12.0*   PT/INR Recent Labs    02/09/21 1455  LABPROT 12.8  INR 1.0    Studies/Results: CT Renal Stone Study  Result Date: 02/07/2021 CLINICAL DATA:  Flank pain. Severe abdominal pain with nausea. Weight loss. EXAM: CT ABDOMEN AND PELVIS WITHOUT CONTRAST TECHNIQUE: Multidetector CT imaging of the abdomen and pelvis was performed following the standard protocol without IV contrast. COMPARISON:  Most recent abdominal CT 04/10/2020 FINDINGS: Lower chest: No acute airspace disease or pleural effusion. Normal heart size. Hepatobiliary: Scattered small hepatic hypodensities are similar to prior exam, likely cysts. Gallbladder physiologically distended, no calcified stone. No biliary dilatation. Pancreas: Peripancreatic edema extending from the head/uncinate process through the body. No definite ductal dilatation or focal fluid collection. Small amount of ill-defined retroperitoneal fluid. Spleen: Normal in size without focal abnormality. Adrenals/Urinary Tract: No adrenal nodule. No hydronephrosis or renal calculi. Mild left greater than right perinephric edema. Small cyst in the right  upper kidney. Decompressed ureters. Minimally distended urinary bladder. Mild wall thickening is likely due to degree of distension. Stomach/Bowel: Decompressed stomach. No small bowel obstruction or inflammation. Fecalization of distal small bowel contents. Normal appendix tentatively visualized. Small to moderate colonic stool burden. There is colonic tortuosity. Descending and sigmoid diverticulosis without diverticulitis. Vascular/Lymphatic: Moderate aortic atherosclerosis. No aortic aneurysm. No portal venous or mesenteric gas. No bulky abdominopelvic adenopathy. Reproductive: Occasional prostatic calcifications. Other: No free air. Peripancreatic fat stranding and edema with small amount of free fluid. No other ascites. Tiny fat containing umbilical hernia. Musculoskeletal: There are no acute or suspicious osseous abnormalities. IMPRESSION: 1. Peripancreatic edema and fat stranding consistent with acute pancreatitis. No focal fluid collection. 2. No renal stones or obstructive uropathy. 3. Colonic diverticulosis without diverticulitis. Aortic Atherosclerosis (ICD10-I70.0). Electronically Signed   By: 06/10/2020 M.D.   On: 02/07/2021 23:28   04/09/2021 Abdomen Limited RUQ (LIVER/GB)  Result Date: 02/08/2021 CLINICAL DATA:  Abnormal liver function tests. EXAM: ULTRASOUND ABDOMEN LIMITED RIGHT UPPER QUADRANT COMPARISON:  None. FINDINGS: Gallbladder: A 4 mm nonshadowing echogenic focus is seen along the nondependent portion of the gallbladder wall. No gallstones or wall thickening visualized (1.9 mm). No sonographic Murphy sign noted by sonographer. Common bile duct: Diameter: 8.0 mm Liver: No focal lesion identified. Within normal limits in parenchymal echogenicity. Portal vein is patent on color Doppler imaging with normal direction of blood flow towards the liver. Other: None. IMPRESSION: 4 mm gallbladder polyp, likely benign in nature. No further evaluation or follow-up is recommended. This recommendation  follows ACR consensus guidelines: White Paper of the ACR Incidental Findings Committee II on Gallbladder and Biliary Findings. J Am Coll Radiol 2013:;10:[id number]. Electronically Signed   By: 04/10/2021 M.D.   On: 02/08/2021 19:46    Impression:  1.  Acute pancreatitis, suspect alcohol-mediated. 2.  Abdominal pain, likely from #1 above. 3.  Elevated LFTs, suspect alcohol-mediated.  Plan:  1.  Medical management of pancreatitis with hydration, judicious analgesics, minimize diet. 2.  Follow LFT trend.  Check PT/INR. 3.  Check acute hepatitis panel. 4.  Clear liquids, do not advance yet. 5.  Likely MRI/MRCP this admission, pending clinical course. 6.  Alcohol cessation. 7.  [facility] will follow.   LOS: 1 day   Karry Causer,Kourtney M  02/09/2021, 3:27 PM  Cell [phone] If no answer or after 5 PM call [phone]

## 2021-02-09 NOTE — Progress Notes (Signed)
PROGRESS NOTE    Elhadji Pecore  ZOX:[id number] DOB: 12-18-1950 DOA: 02/07/2021 PCP: Brayton El, PA-C   Brief Narrative:  Jared Duran is a 70 y.o. male with medical history significant for but not limited too hypertension, history of pancreatitis, alcohol abuse as well as other comorbidities who presented with a 1 week worsening abdominal discomfort, dark urine and a recent history of rapid unintentional weight loss.  Patient states that he started feeling bad last week after cutting 5 lawns and started having abdominal pain then.  He states that he drank the day before.  He progressively has had poor p.o. intake and continued to have abdominal pain to the point that he ended up coming here.  He is noted to have elevated LFTs and elevated lipase.  States that this is happened to him in the past before and he states that he drinks 5 beers a day with his last beer being the day before he started having abdominal pain.  He has had darker urine for about a month and did not realize that his eyes were yellow until today.  He denies any recent travel and no chest pain, shortness of breath cough or diarrhea.  He states that he is lost weight but does not know how much.  [facility] was asked to admit this patient for acute alcoholic pancreatitis and elevated hyperbilirubinemia  ED Course: Patient went to med center [facility] and was given IV fluid hydration and pain management.  He had basic blood work done and a COVID testing which was negative.  Also had a lipase level which was elevated and a CT renal stone study  **Interim History Patient's lipase is trending down further but T bili remains significantly elevated.  GI has been consulted and awaiting further evaluation recommendations  Assessment & Plan:   Active Problems:   Alcoholic pancreatitis  Acute Alcoholic Pancreatitis  Abnormal LFTs Hyperbilirubinemia  -Presented with Abdominal Pain x1 week -His lipase level on admission was 633  and trended down to 194 -CT renal stone study showed "Peripancreatic edema and fat stranding consistent with acute pancreatitis. No focal fluid collection. No renal stones or obstructive uropathy.  Colonic diverticulosis without diverticulitis" -AST was 162 and trended down to 91 and ALT went from 135 ->87 -Patient's T bili was 13.1 and trended down to 12.0 -Will check a PT-INR to calculate Maddrey Discrimant Fxn -He was given a bolus of normal saline yesterday and was made NPO.  Will advance diet to full liquid diet given that he thinks he is doing little bit better -Pain control and maintenance IV fluids with normal saline at 125 mL -We will check a right upper quadrant ultrasound and this showed "No focal lesion identified. Within normal limits in parenchymal echogenicity. Portal vein is patent on color Doppler imaging with normal direction of blood flow towards the liver." but did show a 4 mm gall bladder polyp -Given his significantly elevated bilirubin and alcoholic pancreatitis we have consulted GI for further evaluation recommendations and appreciate recommendations  Hypertension -Hold home p.o. medications for now and start IV hydralazine 10 mg every 6 as needed for systolic blood pressure greater than 160 or DBP greater than 100 -Continue to Monitor blood pressures per protocol -Last BP reading was 141/96  Hyponatremia, improved  -Improved.  Patient sodium went from 132 -> 137 -> 138 -Continue monitor and trend and repeat CMP in a.m.  Alcoholism with concern for withdrawal -CIWA and folic acid, multivitamin and thiamine -Monitor for signs  and symptoms of withdrawal  Macrocytic Anemia -In the setting of alcohol abuse The patient's hemoglobin/hematocrit went from 15.2/41.2 and was likely hemoconcentrated on admission is now 12.4/34.3 -> 11.7/32.3 with an MCV of 102.2 -Check Anemia Panel in the a.m. -Continue to monitor for signs and symptoms of bleeding; currently no overt  bleeding noted -Repeat CBC in a.m.  Thromboyctopenia -Patient's Platelet Count went from 182 -> 139 -> 140 -Continue to Monitor for S/Sx of Bleeding -Repeat CBC in the AM   DVT prophylaxis: Enoxaparin 40 mg sq q24h Code Status: FULL CODE  Family Communication: No family present at bedside Disposition Plan: Pending further clinical improvement and evaluation and clearance by Gastroenterology  Status is: Inpatient  Remains inpatient appropriate because:Unsafe d/c plan, IV treatments appropriate due to intensity of illness or inability to take PO and Inpatient level of care appropriate due to severity of illness   Dispo: The patient is from: Home              Anticipated d/c is to: Home              Patient currently is not medically stable to d/c.   Difficult to place patient No  Consultants:   None   Procedures: None  Antimicrobials:  Anti-infectives (From admission, onward)   None        Subjective: Seen and examined at bedside and was doing a little bit better but continued to have some abdominal pain but not as bad.  Asking about diet so the diet was advanced to full liquid diet.  No nausea or vomiting.  Denies any other concerns or complaints at this time.  Objective: Vitals:   02/09/21 0425 02/09/21 0500 02/09/21 0600 02/09/21 1342  BP: (!) 163/94   (!) 141/96  Pulse: 64  64 70  Resp: 16   18  Temp: (!) 97.4 F (36.3 C)   (!) 97.5 F (36.4 C)  TempSrc: Oral     SpO2: 94%   97%  Weight:  62 kg    Height:        Intake/Output Summary (Last 24 hours) at 02/09/2021 1431 Last data filed at 02/09/2021 1100 Gross per 24 hour  Intake 1600 ml  Output --  Net 1600 ml   Filed Weights   02/07/21 2127 02/09/21 0500  Weight: 59.2 kg 62 kg   Examination: Physical Exam:  Constitutional: Thin slightly chronically ill-appearing AAM in NAD appears calm Eyes: Normal sclera significantly icteric ENMT: External Ears, Nose appear normal. Grossly normal hearing. Neck:  Appears normal, supple, no cervical masses, normal ROM, no appreciable thyromegaly; no JVD Respiratory: Diminished to auscultation bilaterally, no wheezing, rales, rhonchi or crackles. Normal respiratory effort and patient is not tachypenic. No accessory muscle use.  Unlabored breathing Cardiovascular: RRR, no murmurs / rubs / gallops. S1 and S2 auscultated.  No appreciable lower extremity edema Abdomen: Soft, non-tender, non-distended. Bowel sounds positive.  GU: Deferred. Musculoskeletal: No clubbing / cyanosis of digits/nails. No joint deformity upper and lower extremities.  Skin: No rashes, lesions, ulcers on a limited skin evaluation. No induration; Warm and dry.  Neurologic: CN 2-12 grossly intact with no focal deficits. Romberg sign and cerebellar reflexes not assessed.  Psychiatric: Normal judgment and insight. Alert and oriented x 3. Normal mood and appropriate affect.   Data Reviewed: I have personally reviewed following labs and imaging studies  CBC: Recent Labs  Lab 02/07/21 2215 02/08/21 1729 02/09/21 0622  WBC 9.6 7.2 6.4  NEUTROABS 7.1 5.2  --  HGB 15.2 12.4* 11.7*  HCT 41.2 34.3* 32.3*  MCV 101.0* 101.8* 102.2*  PLT 182 139* 140*   Basic Metabolic Panel: Recent Labs  Lab 02/07/21 2215 02/08/21 1729 02/09/21 0622  NA 132* 137 138  K 3.7 3.0* 3.7  CL 92* 101 103  CO2 GLUCOSE 156* 103* 80  BUN 34* 31* 21  CREATININE 1.18 0.73 0.54*  CALCIUM 9.3 8.5* 8.5*  MG  --  1.7  --   PHOS  --  3.7  --    GFR: Estimated Creatinine Clearance: 78.6 mL/min (A) (by C-G formula based on SCr of 0.54 mg/dL (L)). Liver Function Tests: Recent Labs  Lab 02/07/21 2215 02/08/21 1729 02/09/21 0622  AST 162* 102* 91*  ALT 135* 98* 87*  ALKPHOS 168* 135* 122  BILITOT 13.1* 12.2* 12.0*  PROT 7.7 6.3* 5.7*  ALBUMIN 3.5 2.7* 2.6*   Recent Labs  Lab 02/07/21 2215 02/08/21 1729 02/09/21 1025  LIPASE 633* 255* 194*   No results for input(s): AMMONIA in the last  168 hours. Coagulation Profile: No results for input(s): INR, PROTIME in the last 168 hours. Cardiac Enzymes: No results for input(s): CKTOTAL, CKMB, CKMBINDEX, TROPONINI in the last 168 hours. BNP (last 3 results) No results for input(s): PROBNP in the last 8760 hours. HbA1C: No results for input(s): HGBA1C in the last 72 hours. CBG: Recent Labs  Lab 02/09/21 0720  GLUCAP 79   Lipid Profile: No results for input(s): CHOL, HDL, LDLCALC, TRIG, CHOLHDL, LDLDIRECT in the last 72 hours. Thyroid Function Tests: Recent Labs    02/08/21 1723  TSH 1.091   Anemia Panel: No results for input(s): VITAMINB12, FOLATE, FERRITIN, TIBC, IRON, RETICCTPCT in the last 72 hours. Sepsis Labs: No results for input(s): PROCALCITON, LATICACIDVEN in the last 168 hours.  Recent Results (from the past 240 hour(s))  Resp Panel by RT-PCR (Flu A&B, Covid) Nasopharyngeal Swab     Status: None   Collection Time: 02/07/21 11:45 PM   Specimen: Nasopharyngeal Swab; Nasopharyngeal(NP) swabs in vial transport medium  Result Value Ref Range Status   SARS Coronavirus 2 by RT PCR NEGATIVE NEGATIVE Final    Comment: (NOTE) SARS-CoV-2 target nucleic acids are NOT DETECTED.  The SARS-CoV-2 RNA is generally detectable in upper respiratory specimens during the acute phase of infection. The lowest concentration of SARS-CoV-2 viral copies this assay can detect is 138 copies/mL. A negative result does not preclude SARS-Cov-2 infection and should not be used as the sole basis for treatment or other patient management decisions. A negative result may occur with  improper specimen collection/handling, submission of specimen other than nasopharyngeal swab, presence of viral mutation(s) within the areas targeted by this assay, and inadequate number of viral copies(<138 copies/mL). A negative result must be combined with clinical observations, patient history, and epidemiological information. The expected result is  Negative.  Fact Sheet for Patients:  BloggerCourse.com  Fact Sheet for Healthcare Providers:  SeriousBroker.it  This test is no t yet approved or cleared by the [place] FDA and  has been authorized for detection and/or diagnosis of SARS-CoV-2 by FDA under an Emergency Use Authorization (EUA). This EUA will remain  in effect (meaning this test can be used) for the duration of the COVID-19 declaration under Section 564(b)(1) of the Act, 21 U.S.C.section 360bbb-3(b)(1), unless the authorization is terminated  or revoked sooner.       Influenza A by PCR NEGATIVE NEGATIVE Final   Influenza B by PCR NEGATIVE NEGATIVE  Final    Comment: (NOTE) The Xpert Xpress SARS-CoV-2/FLU/RSV plus assay is intended as an aid in the diagnosis of influenza from Nasopharyngeal swab specimens and should not be used as a sole basis for treatment. Nasal washings and aspirates are unacceptable for Xpert Xpress SARS-CoV-2/FLU/RSV testing.  Fact Sheet for Patients: BloggerCourse.comhttps://www.fda.gov/media/152166/download  Fact Sheet for Healthcare Providers: SeriousBroker.ithttps://www.fda.gov/media/152162/download  This test is not yet approved or cleared by the [place] FDA and has been authorized for detection and/or diagnosis of SARS-CoV-2 by FDA under an Emergency Use Authorization (EUA). This EUA will remain in effect (meaning this test can be used) for the duration of the COVID-19 declaration under Section 564(b)(1) of the Act, 21 U.S.C. section 360bbb-3(b)(1), unless the authorization is terminated or revoked.  Performed at [facility] [place], [address] [place], [place] [address]     RN Pressure Injury Documentation:     Estimated body mass index is 20.18 kg/m as calculated from the following:   Height as of this encounter: 5\' 9"  (1.753 m).   Weight as of this encounter: 62 kg.  Malnutrition Type:   Malnutrition Characteristics:   Nutrition  Interventions:    Radiology Studies: CT Renal Stone Study  Result Date: 02/07/2021 CLINICAL DATA:  Flank pain. Severe abdominal pain with nausea. Weight loss. EXAM: CT ABDOMEN AND PELVIS WITHOUT CONTRAST TECHNIQUE: Multidetector CT imaging of the abdomen and pelvis was performed following the standard protocol without IV contrast. COMPARISON:  Most recent abdominal CT 04/10/2020 FINDINGS: Lower chest: No acute airspace disease or pleural effusion. Normal heart size. Hepatobiliary: Scattered small hepatic hypodensities are similar to prior exam, likely cysts. Gallbladder physiologically distended, no calcified stone. No biliary dilatation. Pancreas: Peripancreatic edema extending from the head/uncinate process through the body. No definite ductal dilatation or focal fluid collection. Small amount of ill-defined retroperitoneal fluid. Spleen: Normal in size without focal abnormality. Adrenals/Urinary Tract: No adrenal nodule. No hydronephrosis or renal calculi. Mild left greater than right perinephric edema. Small cyst in the right upper kidney. Decompressed ureters. Minimally distended urinary bladder. Mild wall thickening is likely due to degree of distension. Stomach/Bowel: Decompressed stomach. No small bowel obstruction or inflammation. Fecalization of distal small bowel contents. Normal appendix tentatively visualized. Small to moderate colonic stool burden. There is colonic tortuosity. Descending and sigmoid diverticulosis without diverticulitis. Vascular/Lymphatic: Moderate aortic atherosclerosis. No aortic aneurysm. No portal venous or mesenteric gas. No bulky abdominopelvic adenopathy. Reproductive: Occasional prostatic calcifications. Other: No free air. Peripancreatic fat stranding and edema with small amount of free fluid. No other ascites. Tiny fat containing umbilical hernia. Musculoskeletal: There are no acute or suspicious osseous abnormalities. IMPRESSION: 1. Peripancreatic edema and fat  stranding consistent with acute pancreatitis. No focal fluid collection. 2. No renal stones or obstructive uropathy. 3. Colonic diverticulosis without diverticulitis. Aortic Atherosclerosis (ICD10-I70.0). Electronically Signed   By: Narda RutherfordMelanie  Sanford M.D.   On: 02/07/2021 23:28   US Abdomen Limited RUQ (LIVER/GB)  Result Date: 02/08/2021 CLINICAL DATA:  Abnormal liver function tests. EXAM: ULTRASOUND ABDOMEN LIMITED RIGHT UPPER QUADRANT COMPARISON:  None. FINDINGS: Gallbladder: A 4 mm nonshadowing echogenic focus is seen along the nondependent portion of the gallbladder wall. No gallstones or wall thickening visualized (1.9 mm). No sonographic Murphy sign noted by sonographer. Common bile duct: Diameter: 8.0 mm Liver: No focal lesion identified. Within normal limits in parenchymal echogenicity. Portal vein is patent on color Doppler imaging with normal direction of blood flow towards the liver. Other: None. IMPRESSION: 4 mm gallbladder polyp, likely benign  in nature. No further evaluation or follow-up is recommended. This recommendation follows ACR consensus guidelines: White Paper of the ACR Incidental Findings Committee II on Gallbladder and Biliary Findings. J Am Coll Radiol 2013:;10:953-956. Electronically Signed   By: Aram Candela M.D.   On: 02/08/2021 19:46   Scheduled Meds: . enoxaparin (LOVENOX) injection  40 mg Subcutaneous Q24H  . folic acid  1 mg Oral Daily  . LORazepam  0-4 mg Intravenous Q6H   Followed by  . [START ON 02/10/2021] LORazepam  0-4 mg Intravenous Q12H  . multivitamin with minerals  1 tablet Oral Daily  . pantoprazole (PROTONIX) IV  40 mg Intravenous Q24H  . thiamine  100 mg Oral Daily   Or  . thiamine  100 mg Intravenous Daily   Continuous Infusions: . sodium chloride 125 mL/hr at 02/09/21 0951    LOS: 1 day   Merlene Laughter, DO [facility] PAGER is on AMION  If 7PM-7AM, please contact night-coverage [email or web address]

## 2021-02-10 ENCOUNTER — Inpatient Hospital Stay (HOSPITAL_COMMUNITY): Payer: Medicare Other

## 2021-02-10 DIAGNOSIS — R945 Abnormal results of liver function studies: Secondary | ICD-10-CM

## 2021-02-10 LAB — COMPREHENSIVE METABOLIC PANEL
ALT: 155 U/L — ABNORMAL HIGH (ref 0–44)
AST: 304 U/L — ABNORMAL HIGH (ref 15–41)
Albumin: 2.5 g/dL — ABNORMAL LOW (ref 3.5–5.0)
Alkaline Phosphatase: 157 U/L — ABNORMAL HIGH (ref 38–126)
Anion gap: 5 (ref 5–15)
BUN: 11 mg/dL (ref 8–23)
CO2: 26 mmol/L (ref 22–32)
Calcium: 8.5 mg/dL — ABNORMAL LOW (ref 8.9–10.3)
Chloride: 103 mmol/L (ref 98–111)
Creatinine, Ser: 0.82 mg/dL (ref 0.61–1.24)
GFR, Estimated: >60 mL/min (ref >60)
Glucose, Bld: 111 mg/dL — ABNORMAL HIGH (ref 70–99)
Potassium: 3.6 mmol/L (ref 3.5–5.1)
Sodium: 134 mmol/L — ABNORMAL LOW (ref 135–145)
Total Bilirubin: 10.5 mg/dL — ABNORMAL HIGH (ref 0.3–1.2)
Total Protein: 5.8 g/dL — ABNORMAL LOW (ref 6.5–8.1)

## 2021-02-10 LAB — CBC WITH DIFFERENTIAL/PLATELET
Abs Immature Granulocytes: 0.04 10*3/uL (ref 0.00–0.07)
Basophils Absolute: 0 10*3/uL (ref 0.0–0.1)
Basophils Relative: 1 %
Eosinophils Absolute: 0.1 10*3/uL (ref 0.0–0.5)
Eosinophils Relative: 2 %
HCT: 30.8 % — ABNORMAL LOW (ref 39.0–52.0)
Hemoglobin: 11.3 g/dL — ABNORMAL LOW (ref 13.0–17.0)
Immature Granulocytes: 1 %
Lymphocytes Relative: 17 %
Lymphs Abs: 1.1 10*3/uL (ref 0.7–4.0)
MCH: 36.9 pg — ABNORMAL HIGH (ref 26.0–34.0)
MCHC: 36.7 g/dL — ABNORMAL HIGH (ref 30.0–36.0)
MCV: 100.7 fL — ABNORMAL HIGH (ref 80.0–100.0)
Monocytes Absolute: 0.6 10*3/uL (ref 0.1–1.0)
Monocytes Relative: 10 %
Neutro Abs: 4.3 10*3/uL (ref 1.7–7.7)
Neutrophils Relative %: 69 %
Platelets: 139 10*3/uL — ABNORMAL LOW (ref 150–400)
RBC: 3.06 MIL/uL — ABNORMAL LOW (ref 4.22–5.81)
RDW: 15.1 % (ref 11.5–15.5)
WBC: 6.2 10*3/uL (ref 4.0–10.5)
nRBC: 0 % (ref 0.0–0.2)

## 2021-02-10 LAB — PHOSPHORUS: Phosphorus: 3.3 mg/dL (ref 2.5–4.6)

## 2021-02-10 LAB — GLUCOSE, CAPILLARY
Glucose-Capillary: 124 mg/dL — ABNORMAL HIGH (ref 70–99)
Glucose-Capillary: 132 mg/dL — ABNORMAL HIGH (ref 70–99)
Glucose-Capillary: 119 mg/dL — ABNORMAL HIGH (ref 70–99)

## 2021-02-10 LAB — PROTIME-INR
INR: 1 (ref 0.8–1.2)
Prothrombin Time: 12.9 seconds (ref 11.4–15.2)

## 2021-02-10 LAB — HEPATITIS PANEL, ACUTE
HCV Ab: NONREACTIVE
Hep A IgM: NONREACTIVE
Hep B C IgM: NONREACTIVE
Hepatitis B Surface Ag: NONREACTIVE

## 2021-02-10 LAB — LIPASE, BLOOD: Lipase: 161 U/L — ABNORMAL HIGH (ref 11–51)

## 2021-02-10 LAB — MAGNESIUM: Magnesium: 1.5 mg/dL — ABNORMAL LOW (ref 1.7–2.4)

## 2021-02-10 MED ORDER — MAGNESIUM SULFATE 2 GM/50ML IV SOLN
2.0000 g | Freq: Once | INTRAVENOUS | Status: AC
  Administered 2021-02-10: 2 g via INTRAVENOUS
  Filled 2021-02-10: qty 50

## 2021-02-10 MED ORDER — GADOBUTROL 1 MMOL/ML IV SOLN
6.0000 mL | Freq: Once | INTRAVENOUS | Status: AC | PRN
  Administered 2021-02-10: 6 mL via INTRAVENOUS

## 2021-02-10 NOTE — Progress Notes (Signed)
Notified by CCMD( tele) that pt had a short run of SVT with rate 160s. Pt had gotten up to the bathroom and by the time I evaluated pt the HR had returned to < 80

## 2021-02-10 NOTE — Progress Notes (Signed)
Subjective: Less abdominal pain. Is hungry.  Objective: Vital signs in last 24 hours: Temp:  [97.5 F (36.4 C)-97.7 F (36.5 C)] 97.7 F (36.5 C) (06/05 0421) Pulse Rate:  [69-71] 69 (06/05 0421) Resp:  [16-18] 16 (06/05 0421) BP: (141-167)/(96-104) 149/104 (06/05 0421) SpO2:  [97 %-99 %] 99 % (06/05 0421) Weight change:  Last BM Date: 02/04/21  PE: GEN:  NAD HEENT:  Scleral icterus bilaterally ABD:  Soft, mild epigastric tenderness without peritonitis  Lab Results: CBC    Component Value Date/Time   WBC 6.2 02/10/2021 0557   RBC 3.06 (L) 02/10/2021 0557   HGB 11.3 (L) 02/10/2021 0557   HCT 30.8 (L) 02/10/2021 0557   PLT 139 (L) 02/10/2021 0557   MCV 100.7 (H) 02/10/2021 0557   MCH 36.9 (H) 02/10/2021 0557   MCHC 36.7 (H) 02/10/2021 0557   RDW 15.1 02/10/2021 0557   LYMPHSABS 1.1 02/10/2021 0557   MONOABS 0.6 02/10/2021 0557   EOSABS 0.1 02/10/2021 0557   BASOSABS 0.0 02/10/2021 0557   CMP     Component Value Date/Time   NA 134 (L) 02/10/2021 0557   K 3.6 02/10/2021 0557   CL 103 02/10/2021 0557   CO2 26 02/10/2021 0557   GLUCOSE 111 (H) 02/10/2021 0557   BUN 11 02/10/2021 0557   CREATININE 0.82 02/10/2021 0557   CALCIUM 8.5 (L) 02/10/2021 0557   PROT 5.8 (L) 02/10/2021 0557   ALBUMIN 2.5 (L) 02/10/2021 0557   AST 304 (H) 02/10/2021 0557   ALT 155 (H) 02/10/2021 0557   ALKPHOS 157 (H) 02/10/2021 0557   BILITOT 10.5 (H) 02/10/2021 0557   GFRNONAA >60 02/10/2021 0557   GFRAA >60 04/11/2020 0523   Assessment:  1.  Acute pancreatitis, suspect alcohol-mediated. 2.  Abdominal pain, likely from #1 above. 3.  Elevated LFTs, suspect alcohol-mediated.  Plan:  1.  Hold off on prednisolone until intra-abdominal infection (abscess, etc) can be better ruled out. 2.  Agree with MRI/MRCP. 3.  Since pain is improving, ok with soft diet. 4.  [facility] will follow.   TRENNON, TORBECK 02/10/2021, 1:00 PM   Cell [phone] If no answer or after 5 PM call  [phone]

## 2021-02-10 NOTE — Progress Notes (Signed)
PROGRESS NOTE    Dravin Lance  ZOX:[id number] DOB: 1951-02-02 DOA: 02/07/2021 PCP: Brayton El, PA-C   Brief Narrative:  Jared Duran is a 70 y.o. male with medical history significant for but not limited too hypertension, history of pancreatitis, alcohol abuse as well as other comorbidities who presented with a 1 week worsening abdominal discomfort, dark urine and a recent history of rapid unintentional weight loss.  Patient states that he started feeling bad last week after cutting 5 lawns and started having abdominal pain then.  He states that he drank the day before.  He progressively has had poor p.o. intake and continued to have abdominal pain to the point that he ended up coming here.  He is noted to have elevated LFTs and elevated lipase.  States that this is happened to him in the past before and he states that he drinks 5 beers a day with his last beer being the day before he started having abdominal pain.  He has had darker urine for about a month and did not realize that his eyes were yellow until today.  He denies any recent travel and no chest pain, shortness of breath cough or diarrhea.  He states that he is lost weight but does not know how much.  [facility] was asked to admit this patient for acute alcoholic pancreatitis and elevated hyperbilirubinemia  ED Course: Patient went to med center [facility] and was given IV fluid hydration and pain management.  He had basic blood work done and a COVID testing which was negative.  Also had a lipase level which was elevated and a CT renal stone study  **Interim History Patient's lipase is trending down further To 161 but T bili remains significantly elevated and is slowly trending down. LFT's worsening again  GI has been consulted and awaiting further evaluation recommendations  Assessment & Plan:   Active Problems:   Alcoholic pancreatitis  Acute Alcoholic Pancreatitis  Abnormal LFTs, initially improving but now worsening   Hyperbilirubinemia, improving slowly -Presented with Abdominal Pain x1 week -His lipase level on admission was 633 and trended down to 161 -CT renal stone study showed "Peripancreatic edema and fat stranding consistent with acute pancreatitis. No focal fluid collection. No renal stones or obstructive uropathy.  Colonic diverticulosis without diverticulitis" -AST was 162 and trended down to 91 but now worsened to 304 and ALT went from 135 ->87 and is now worsened to 155 -Patient's T bili was 13.1 and trended down to 12.0 -Will check a PT-INR to calculate Maddrey Discrimant Fxn; PT-INR was 12.9 and INR was 1.0; -Maddery Discriminant Fxn Score was 8.7 and had good Prognosis for Glucocorticoid therapy; Will Defer to GI to tart Prednisolone  -He was given a bolus of normal saline yesterday and was made NPO.  Will advance diet to full liquid diet given that he thinks he is doing little bit better -Pain control and maintenance IV fluids with normal saline at 125 mL but will reduce rate to 100 mL/hr -We will check a right upper quadrant ultrasound and this showed "No focal lesion identified. Within normal limits in parenchymal echogenicity. Portal vein is patent on color Doppler imaging with normal direction of blood flow towards the liver." but did show a 4 mm gall bladder polyp -Given his significantly elevated bilirubin and alcoholic pancreatitis we have consulted GI for further evaluation recommendations and appreciate recommendations -GI recommending medical management with Hydration, Judicious Analagesics, and Minimizind diet and not advancing past a Liquid Diet. GI  recommending also checking Acute Hepatitis Panel and feel the patient may end up with an MRI/MRCP this Admission   Hypertension -Hold home p.o. medications for now and start IV hydralazine 10 mg every 6 as needed for systolic blood pressure greater than 160 or DBP greater than 100 -Continue to Monitor blood pressures per protocol -Last  BP reading was 141/96  Hyponatremia -Improved.  Patient sodium went from 132 -> 137 -> 138 -> 134 -Continue monitor and trend and repeat CMP in a.m.  Alcoholism with concern for withdrawal -CIWA and folic acid, multivitamin and thiamine -Monitor for signs and symptoms of withdrawal  Macrocytic Anemia -In the setting of alcohol abuse The patient's hemoglobin/hematocrit went from 15.2/41.2 and was likely hemoconcentrated on admission is now 12.4/34.3 -> 11.7/32.3 -> 11.3 with an MCV of 100.7 -Check Anemia Panel in the a.m. -Continue to monitor for signs and symptoms of bleeding; currently no overt bleeding noted -Repeat CBC in a.m.  Hypomagnesemia -Patient's Mag Level was 1.5 -Replete with IV Mag Sulfate 2 grams -Continue to Monitor and Replete as Necessary -Repeat Mag Level in the AM   GERD -Continue with pantoprazole 40 mg IV q. 24 and resume p.o. once diet has been advanced fully  Thromboyctopenia -Patient's Platelet Count went from 182 -> 139 -> 140 -> 139 -Continue to Monitor for S/Sx of Bleeding -Repeat CBC in the AM   DVT prophylaxis: Enoxaparin 40 mg sq q24h Code Status: FULL CODE  Family Communication: No family present at bedside Disposition Plan: Pending further clinical improvement and evaluation and clearance by Gastroenterology  Status is: Inpatient  Remains inpatient appropriate because:Unsafe d/c plan, IV treatments appropriate due to intensity of illness or inability to take PO and Inpatient level of care appropriate due to severity of illness   Dispo: The patient is from: Home              Anticipated d/c is to: Home              Patient currently is not medically stable to d/c.   Difficult to place patient No  Consultants:   None   Procedures: None  Antimicrobials:  Anti-infectives (From admission, onward)   None        Subjective: Seen and examined at bedside and was still having some pain but not as bad.  Asking for his diet to be  advanced..  Bilirubin still remains elevated LFTs have gone the wrong word.  No nausea or vomiting.  No other concerns or complaints at this time . Objective: Vitals:   02/09/21 0600 02/09/21 1342 02/09/21 2038 02/10/21 0421  BP:  (!) 141/96 (!) 167/103 (!) 149/104  Pulse: 64 70 71 69  Resp:  18 16 16   Temp:  (!) 97.5 F (36.4 C) (!) 97.5 F (36.4 C) 97.7 F (36.5 C)  TempSrc:   Oral Oral  SpO2:  97% 97% 99%  Weight:      Height:        Intake/Output Summary (Last 24 hours) at 02/10/2021 1200 Last data filed at 02/09/2021 2207 Gross per 24 hour  Intake 720 ml  Output --  Net 720 ml   Filed Weights   02/07/21 2127 02/09/21 0500  Weight: 59.2 kg 62 kg   Examination: Physical Exam:  Constitutional: The patient is a thin slightly chronically ill appearing African-American male currently in no acute distress who appears calm  Eyes: Lids and conjunctivae normal, sclerae is significantly icteric  ENMT: External Ears, Nose appear normal.  Grossly normal hearing. Mucous membranes are moist.  Neck: Appears normal, supple, no cervical masses, normal ROM, no appreciable thyromegaly; no JVD Respiratory: Diminished to auscultation bilaterally, no wheezing, rales, rhonchi or crackles. Normal respiratory effort and patient is not tachypenic. No accessory muscle use. Unlabored breathing  Cardiovascular: RRR, no murmurs / rubs / gallops. S1 and S2 auscultated. No carotid bruits.  Abdomen: Soft, non-tender, non-distended. Bowel sounds positive.  GU: Deferred. Musculoskeletal: No clubbing / cyanosis of digits/nails. No joint deformity upper and lower extremities.  Skin: No rashes, lesions, ulcers on a limited skin evaluation. No induration; Warm and dry.  Neurologic: CN 2-12 grossly intact with no focal deficits. Romberg sign and cerebellar reflexes not assessed.  Psychiatric: Normal judgment and insight. Alert and oriented x 3. Normal mood and appropriate affect.   Data Reviewed: I have  personally reviewed following labs and imaging studies  CBC: Recent Labs  Lab 02/07/21 2215 02/08/21 1729 02/09/21 0622 02/10/21 0557  WBC 9.6 7.2 6.4 6.2  NEUTROABS 7.1 5.2  --  4.3  HGB 15.2 12.4* 11.7* 11.3*  HCT 41.2 34.3* 32.3* 30.8*  MCV 101.0* 101.8* 102.2* 100.7*  PLT 182 139* 140* 139*   Basic Metabolic Panel: Recent Labs  Lab 02/07/21 2215 02/08/21 1729 02/09/21 0622 02/10/21 0557  NA 132* 137 138 134*  K 3.7 3.0* 3.7 3.6  CL 92* 101 103 103  CO2 26 27 25 26   GLUCOSE 156* 103* 80 111*  BUN 34* 31* 21 11  CREATININE 1.18 0.73 0.54* 0.82  CALCIUM 9.3 8.5* 8.5* 8.5*  MG  --  1.7  --  1.5*  PHOS  --  3.7  --  3.3   GFR: Estimated Creatinine Clearance: 76.7 mL/min (by C-G formula based on SCr of 0.82 mg/dL). Liver Function Tests: Recent Labs  Lab 02/07/21 2215 02/08/21 1729 02/09/21 0622 02/10/21 0557  AST 162* 102* 91* 304*  ALT 135* 98* 87* 155*  ALKPHOS 168* 135* 122 157*  BILITOT 13.1* 12.2* 12.0* 10.5*  PROT 7.7 6.3* 5.7* 5.8*  ALBUMIN 3.5 2.7* 2.6* 2.5*   Recent Labs  Lab 02/07/21 2215 02/08/21 1729 02/09/21 1025 02/10/21 0557  LIPASE 633* 255* 194* 161*   No results for input(s): AMMONIA in the last 168 hours. Coagulation Profile: Recent Labs  Lab 02/09/21 1455 02/10/21 0557  INR 1.0 1.0   Cardiac Enzymes: No results for input(s): CKTOTAL, CKMB, CKMBINDEX, TROPONINI in the last 168 hours. BNP (last 3 results) No results for input(s): PROBNP in the last 8760 hours. HbA1C: No results for input(s): HGBA1C in the last 72 hours. CBG: Recent Labs  Lab 02/09/21 0720 02/10/21 0712  GLUCAP 79 124*   Lipid Profile: No results for input(s): CHOL, HDL, LDLCALC, TRIG, CHOLHDL, LDLDIRECT in the last 72 hours. Thyroid Function Tests: Recent Labs    02/08/21 1723  TSH 1.091   Anemia Panel: No results for input(s): VITAMINB12, FOLATE, FERRITIN, TIBC, IRON, RETICCTPCT in the last 72 hours. Sepsis Labs: No results for input(s):  PROCALCITON, LATICACIDVEN in the last 168 hours.  Recent Results (from the past 240 hour(s))  Resp Panel by RT-PCR (Flu A&B, Covid) Nasopharyngeal Swab     Status: None   Collection Time: 02/07/21 11:45 PM   Specimen: Nasopharyngeal Swab; Nasopharyngeal(NP) swabs in vial transport medium  Result Value Ref Range Status   SARS Coronavirus 2 by RT PCR NEGATIVE NEGATIVE Final    Comment: (NOTE) SARS-CoV-2 target nucleic acids are NOT DETECTED.  The SARS-CoV-2 RNA is generally detectable  in upper respiratory specimens during the acute phase of infection. The lowest concentration of SARS-CoV-2 viral copies this assay can detect is 138 copies/mL. A negative result does not preclude SARS-Cov-2 infection and should not be used as the sole basis for treatment or other patient management decisions. A negative result may occur with  improper specimen collection/handling, submission of specimen other than nasopharyngeal swab, presence of viral mutation(s) within the areas targeted by this assay, and inadequate number of viral copies(<138 copies/mL). A negative result must be combined with clinical observations, patient history, and epidemiological information. The expected result is Negative.  Fact Sheet for Patients:  BloggerCourse.comhttps://www.fda.gov/media/152166/download  Fact Sheet for Healthcare Providers:  SeriousBroker.ithttps://www.fda.gov/media/152162/download  This test is no t yet approved or cleared by the [place] FDA and  has been authorized for detection and/or diagnosis of SARS-CoV-2 by FDA under an Emergency Use Authorization (EUA). This EUA will remain  in effect (meaning this test can be used) for the duration of the COVID-19 declaration under Section 564(b)(1) of the Act, 21 U.S.C.section 360bbb-3(b)(1), unless the authorization is terminated  or revoked sooner.       Influenza A by PCR NEGATIVE NEGATIVE Final   Influenza B by PCR NEGATIVE NEGATIVE Final    Comment: (NOTE) The Xpert Xpress  SARS-CoV-2/FLU/RSV plus assay is intended as an aid in the diagnosis of influenza from Nasopharyngeal swab specimens and should not be used as a sole basis for treatment. Nasal washings and aspirates are unacceptable for Xpert Xpress SARS-CoV-2/FLU/RSV testing.  Fact Sheet for Patients: BloggerCourse.comhttps://www.fda.gov/media/152166/download  Fact Sheet for Healthcare Providers: SeriousBroker.ithttps://www.fda.gov/media/152162/download  This test is not yet approved or cleared by the [place] FDA and has been authorized for detection and/or diagnosis of SARS-CoV-2 by FDA under an Emergency Use Authorization (EUA). This EUA will remain in effect (meaning this test can be used) for the duration of the COVID-19 declaration under Section 564(b)(1) of the Act, 21 U.S.C. section 360bbb-3(b)(1), unless the authorization is terminated or revoked.  Performed at [facility] [place], [address] [place], [place] [id number]     RN Pressure Injury Documentation:     Estimated body mass index is 20.18 kg/m as calculated from the following:   Height as of this encounter: 5\' 9"  (1.753 m).   Weight as of this encounter: 62 kg.  Malnutrition Type:   Malnutrition Characteristics:   Nutrition Interventions:    Radiology Studies: US Abdomen Limited RUQ (LIVER/GB)  Result Date: 02/08/2021 CLINICAL DATA:  Abnormal liver function tests. EXAM: ULTRASOUND ABDOMEN LIMITED RIGHT UPPER QUADRANT COMPARISON:  None. FINDINGS: Gallbladder: A 4 mm nonshadowing echogenic focus is seen along the nondependent portion of the gallbladder wall. No gallstones or wall thickening visualized (1.9 mm). No sonographic Murphy sign noted by sonographer. Common bile duct: Diameter: 8.0 mm Liver: No focal lesion identified. Within normal limits in parenchymal echogenicity. Portal vein is patent on color Doppler imaging with normal direction of blood flow towards the liver. Other: None. IMPRESSION: 4 mm gallbladder polyp, likely benign in  nature. No further evaluation or follow-up is recommended. This recommendation follows ACR consensus guidelines: White Paper of the ACR Incidental Findings Committee II on Gallbladder and Biliary Findings. J Am Coll Radiol 2013:;10:953-956. Electronically Signed   By: Aram Candelahaddeus  Houston M.D.   On: 02/08/2021 19:46   Scheduled Meds: . enoxaparin (LOVENOX) injection  40 mg Subcutaneous Q24H  . folic acid  1 mg Oral Daily  . LORazepam  0-4 mg Intravenous Q6H   Followed by  .  LORazepam  0-4 mg Intravenous Q12H  . multivitamin with minerals  1 tablet Oral Daily  . pantoprazole (PROTONIX) IV  40 mg Intravenous Q24H  . thiamine  100 mg Oral Daily   Or  . thiamine  100 mg Intravenous Daily   Continuous Infusions: . sodium chloride 125 mL/hr at 02/09/21 1700    LOS: 2 days   Merlene Laughter, DO [facility] PAGER is on AMION  If 7PM-7AM, please contact night-coverage [email or web address]

## 2021-02-11 DIAGNOSIS — K8689 Other specified diseases of pancreas: Secondary | ICD-10-CM

## 2021-02-11 LAB — COMPREHENSIVE METABOLIC PANEL
ALT: 139 U/L — ABNORMAL HIGH (ref 0–44)
AST: 144 U/L — ABNORMAL HIGH (ref 15–41)
Albumin: 2.4 g/dL — ABNORMAL LOW (ref 3.5–5.0)
Alkaline Phosphatase: 138 U/L — ABNORMAL HIGH (ref 38–126)
Anion gap: 6 (ref 5–15)
BUN: 8 mg/dL (ref 8–23)
CO2: 24 mmol/L (ref 22–32)
Calcium: 8.3 mg/dL — ABNORMAL LOW (ref 8.9–10.3)
Chloride: 103 mmol/L (ref 98–111)
Creatinine, Ser: 0.79 mg/dL (ref 0.61–1.24)
GFR, Estimated: >60 mL/min (ref >60)
Glucose, Bld: 117 mg/dL — ABNORMAL HIGH (ref 70–99)
Potassium: 2.9 mmol/L — ABNORMAL LOW (ref 3.5–5.1)
Sodium: 133 mmol/L — ABNORMAL LOW (ref 135–145)
Total Bilirubin: 6.4 mg/dL — ABNORMAL HIGH (ref 0.3–1.2)
Total Protein: 5.5 g/dL — ABNORMAL LOW (ref 6.5–8.1)

## 2021-02-11 LAB — RETICULOCYTES
Immature Retic Fract: 11.8 % (ref 2.3–15.9)
RBC.: 2.96 MIL/uL — ABNORMAL LOW (ref 4.22–5.81)
Retic Count, Absolute: 35.5 10*3/uL (ref 19.0–186.0)
Retic Ct Pct: 1.2 % (ref 0.4–3.1)

## 2021-02-11 LAB — CBC WITH DIFFERENTIAL/PLATELET
Abs Immature Granulocytes: 0.16 10*3/uL — ABNORMAL HIGH (ref 0.00–0.07)
Basophils Absolute: 0 10*3/uL (ref 0.0–0.1)
Basophils Relative: 0 %
Eosinophils Absolute: 0.1 10*3/uL (ref 0.0–0.5)
Eosinophils Relative: 1 %
HCT: 30.2 % — ABNORMAL LOW (ref 39.0–52.0)
Hemoglobin: 10.9 g/dL — ABNORMAL LOW (ref 13.0–17.0)
Immature Granulocytes: 2 %
Lymphocytes Relative: 13 %
Lymphs Abs: 1.2 10*3/uL (ref 0.7–4.0)
MCH: 36.8 pg — ABNORMAL HIGH (ref 26.0–34.0)
MCHC: 36.1 g/dL — ABNORMAL HIGH (ref 30.0–36.0)
MCV: 102 fL — ABNORMAL HIGH (ref 80.0–100.0)
Monocytes Absolute: 0.9 10*3/uL (ref 0.1–1.0)
Monocytes Relative: 10 %
Neutro Abs: 6.8 10*3/uL (ref 1.7–7.7)
Neutrophils Relative %: 74 %
Platelets: 152 10*3/uL (ref 150–400)
RBC: 2.96 MIL/uL — ABNORMAL LOW (ref 4.22–5.81)
RDW: 15.9 % — ABNORMAL HIGH (ref 11.5–15.5)
WBC: 9.2 10*3/uL (ref 4.0–10.5)
nRBC: 0 % (ref 0.0–0.2)

## 2021-02-11 LAB — VITAMIN B12: Vitamin B-12: 349 pg/mL (ref 180–914)

## 2021-02-11 LAB — IRON AND TIBC
Iron: 46 ug/dL (ref 45–182)
Saturation Ratios: 16 % — ABNORMAL LOW (ref 17.9–39.5)
TIBC: 283 ug/dL (ref 250–450)
UIBC: 237 ug/dL

## 2021-02-11 LAB — PHOSPHORUS: Phosphorus: 2.7 mg/dL (ref 2.5–4.6)

## 2021-02-11 LAB — GLUCOSE, CAPILLARY: Glucose-Capillary: 121 mg/dL — ABNORMAL HIGH (ref 70–99)

## 2021-02-11 LAB — MAGNESIUM: Magnesium: 1.6 mg/dL — ABNORMAL LOW (ref 1.7–2.4)

## 2021-02-11 LAB — LIPASE, BLOOD: Lipase: 125 U/L — ABNORMAL HIGH (ref 11–51)

## 2021-02-11 LAB — FERRITIN: Ferritin: 297 ng/mL (ref 24–336)

## 2021-02-11 LAB — FOLATE: Folate: 7.5 ng/mL (ref >5.9)

## 2021-02-11 MED ORDER — POTASSIUM CHLORIDE CRYS ER 10 MEQ PO TBCR
40.0000 meq | EXTENDED_RELEASE_TABLET | Freq: Two times a day (BID) | ORAL | Status: AC
  Administered 2021-02-11 (×2): 40 meq via ORAL
  Filled 2021-02-11 (×2): qty 4

## 2021-02-11 MED ORDER — POTASSIUM CHLORIDE 10 MEQ/100ML IV SOLN
10.0000 meq | INTRAVENOUS | Status: AC
Start: 2021-02-11 — End: 2021-02-11
  Administered 2021-02-11 (×3): 10 meq via INTRAVENOUS
  Filled 2021-02-11 (×3): qty 100

## 2021-02-11 MED ORDER — MAGNESIUM SULFATE 4 GM/100ML IV SOLN
4.0000 g | Freq: Once | INTRAVENOUS | Status: AC
  Administered 2021-02-11: 4 g via INTRAVENOUS
  Filled 2021-02-11: qty 100

## 2021-02-11 NOTE — Progress Notes (Signed)
PROGRESS NOTE    Jared Duran  MPN:[id number] DOB: 1951-05-28 DOA: 02/07/2021 PCP: Jared El, PA-C   Brief Narrative:  Jared Duran is a 70 y.o. male with medical history significant for but not limited too hypertension, history of pancreatitis, alcohol abuse as well as other comorbidities who presented with a 1 week worsening abdominal discomfort, dark urine and a recent history of rapid unintentional weight loss.  Patient states that he started feeling bad last week after cutting 5 lawns and started having abdominal pain then.  He states that he drank the day before.  He progressively has had poor p.o. intake and continued to have abdominal pain to the point that he ended up coming here.  He is noted to have elevated LFTs and elevated lipase.  States that this is happened to him in the past before and he states that he drinks 5 beers a day with his last beer being the day before he started having abdominal pain.  He has had darker urine for about a month and did not realize that his eyes were yellow until today.  He denies any recent travel and no chest pain, shortness of breath cough or diarrhea.  He states that he is lost weight but does not know how much.  TR[facility] was asked to admit this patient for acute alcoholic pancreatitis and elevated hyperbilirubinemia  ED Course: Patient went to med center [facility] and was given IV fluid hydration and pain management.  He had basic blood work done and a COVID testing which was negative.  Also had a lipase level which was elevated and a CT renal stone study  **Interim History Patient's lipase is trending down further To 125 but T bili remains significantly elevated and is slowly trending down. LFT's worsening again  GI has been consulted and awaiting further evaluation recommendations.  His diet was advanced to a soft diet and he is tolerating this well however subsequently also went under MRCP.  Showed a 3.9 x 3.8 cm area of fullness in the  pancreatic head causing abrupt obstruction of the common bile duct.  Given his 20 pound weight loss in the last few months there is concern for malignancy so GI is checking a CA 19-9 and discussing about getting an EUS along with an ERCP.  Assessment & Plan:   Active Problems:   Alcoholic pancreatitis  Acute Alcoholic Pancreatitis, improving Abnormal LFTs, initially improving but now worsening  Hyperbilirubinemia, improving slowly however there is now concern for obstruction of the common bile duct with an area of 3.9 x 3.8 fullness in the pancreatic head concern for malignancy -Presented with Abdominal Pain x1 week -His lipase level on admission was 633 and trended down to 125 -CT renal stone study showed "Peripancreatic edema and fat stranding consistent with acute pancreatitis. No focal fluid collection. No renal stones or obstructive uropathy.  Colonic diverticulosis without diverticulitis" -AST was 162 and trended down to 91 but now worsened to 304 yesterday and is now improved to 144 today and ALT went from 135 ->87 and is now worsened to 155 yesterday and is improved to 139 today -Patient's T bili was 13.1 and trended down to 6.4 today -Will check a PT-INR to calculate Maddrey Discrimant Fxn; PT-INR was 12.9 and INR was 1.0; -Maddery Discriminant Fxn Score was 8.7 and had good Prognosis for Glucocorticoid therapy; Will Defer to GI to tart Prednisolone  -He was given a bolus of normal saline initially and was made NPO.  Diet  has been fully advanced to a soft diet now -Pain control and maintenance IV fluids with normal saline at 125 mL but will reduce rate to 100 mL/hr -We will check a right upper quadrant ultrasound and this showed "No focal lesion identified. Within normal limits in parenchymal echogenicity. Portal vein is patent on color Doppler imaging with normal direction of blood flow towards the liver." but did show a 4 mm gall bladder polyp -Given his significantly elevated  bilirubin and alcoholic pancreatitis we have consulted GI for further evaluation recommendations and appreciate recommendations -GI recommending medical management with Hydration, Judicious Analagesics, and Minimizind diet and not advancing past a Liquid Diet but has now advanced to a soft diet. GI recommending also checking Acute Hepatitis Panel and feel the patient may end up with an MRI/MRCP this Admission  -Acute hepatitis panel done and is negative -MRI MRCP done and showed "Diffuse inflammatory fat stranding and fluid about the pancreas, consistent with acute pancreatitis. Intra and extrahepatic biliary ductal dilatation, with abrupt obstruction of the common bile duct in the central pancreatic head. There is fullness of the pancreatic head, measuring approximately 3.9 x 3.8 cm, without discrete mass identified. These findings are concerning for underlying pancreatic adenocarcinoma. Small volume perihepatic ascites." -GI discussing about getting an EUS with ERCP and also checking a CA 19-9 level  Hypertension -Held home p.o. medications given his n.p.o. status and slow diet advancement.  Started on IV hydralazine 10 mg every 6 as needed for systolic blood pressure greater than 160 or DBP greater than 100 -Continue to Monitor blood pressures per protocol -Last BP reading was 122/89 -We will consider resuming losartan 100 mg p.o. daily in the a.m. as well as nifedipine 20 mg p.o. 3 times daily but his blood pressure is on the softer side and normalized  Hyponatremia -Improved.  Patient sodium went from 132 -> 137 -> 138 -> 134 and today is 133 -Continue monitor and trend and repeat CMP in a.m.  Alcoholism with concern for withdrawal -CIWA and folic acid, multivitamin and thiamine -Monitor for signs and symptoms of withdrawal  Macrocytic Anemia -In the setting of alcohol abuse The patient's hemoglobin/hematocrit went from 15.2/41.2 and was likely hemoconcentrated on admission is now  12.4/34.3 -> 11.7/32.3 -> 11.3 with an MCV of 100.7 -Check Anemia Panel and showed an iron level of 46, U IBC 237, TIBC of 283, saturation ratios of 16%, ferritin level 297, folate level 7.5 -Continue to monitor for signs and symptoms of bleeding; currently no overt bleeding noted -Repeat CBC in a.m.  Hypokalemia -Patient's potassium this morning was 2.9 -Replete with IV KCl 30 mEq as well as p.o. potassium chloride 40 mill colons twice daily x2 doses -Mag level was low at 1.6 so this is also being repleted as below -Continue monitor and replete as necessary -Repeat CMP in the a.m.  Hypomagnesemia -Patient's Mag Level was 1.6 -Replete with IV Mag Sulfate 2 grams again -Continue to Monitor and Replete as Necessary -Repeat Mag Level in the AM   GERD -Continue with pantoprazole 40 mg IV q. 24 and resume p.o. once diet has been advanced fully  Thromboyctopenia -Patient's Platelet Count went from 182 -> 139 -> 140 -> 139 and today it is 152 and normalized -Continue to Monitor for S/Sx of Bleeding -Repeat CBC in the AM   DVT prophylaxis: Enoxaparin 40 mg sq q24h Code Status: FULL CODE  Family Communication: No family present at bedside Disposition Plan: Pending further clinical improvement and evaluation and  clearance by Gastroenterology  Status is: Inpatient  Remains inpatient appropriate because:Unsafe d/c plan, IV treatments appropriate due to intensity of illness or inability to take PO and Inpatient level of care appropriate due to severity of illness   Dispo: The patient is from: Home              Anticipated d/c is to: Home              Patient currently is not medically stable to d/c.   Difficult to place patient No  Consultants:   Gastroenterology   Procedures: MRI/MRCP  Antimicrobials:  Anti-infectives (From admission, onward)   None        Subjective: Seen and examined at bedside and tolerating his diet without issues.  Bilirubin still remains elevated  but trending down.  No nausea or vomiting.  Asking questions about getting the ERCP.  No other concerns or complaints at this time.  Objective: Vitals:   02/10/21 0421 02/10/21 2014 02/11/21 0500 02/11/21 0514  BP: (!) 149/104 (!) 146/97  122/89  Pulse: 69 80  82  Resp: Temp: 97.7 F (36.5 C) 98.3 F (36.8 C)  99.3 F (37.4 C)  TempSrc: Oral Oral  Oral  SpO2: 99% 98%  98%  Weight:   63.8 kg   Height:        Intake/Output Summary (Last 24 hours) at 02/11/2021 1252 Last data filed at 02/11/2021 1102 Gross per 24 hour  Intake 4837.99 ml  Output --  Net 4837.99 ml   Filed Weights   02/07/21 2127 02/09/21 0500 02/11/21 0500  Weight: 59.2 kg 62 kg 63.8 kg   Examination: Physical Exam:  Constitutional: The patient is a thin slightly chronically ill-appearing African-American male currently in no acute distress appears calm Eyes: Lids are normal. Scelare Icteric  ENMT: External Ears, Nose appear normal. Grossly normal hearing.  Neck: Appears normal, supple, no cervical masses, normal ROM, no appreciable thyromegaly; no JVD Respiratory: Diminished to auscultation bilaterally, no wheezing, rales, rhonchi or crackles. Normal respiratory effort and patient is not tachypenic. No accessory muscle use. Unlabored breathing  Cardiovascular: RRR, no murmurs / rubs / gallops. S1 and S2 auscultated. No extremity edema.  Abdomen: Soft, non-tender, non-distended. Bowel sounds positive.  GU: Deferred. Musculoskeletal: No clubbing / cyanosis of digits/nails. No joint deformity upper and lower extremities.  Skin: No rashes, lesions, ulcers on a limited skin evaluation. No induration; Warm and dry.  Neurologic: CN 2-12 grossly intact with no focal deficits. Romberg sign and cerebellar reflexes not assessed.  Psychiatric: Normal judgment and insight. Alert and oriented x 3. Normal mood and appropriate affect.   Data Reviewed: I have personally reviewed following labs and imaging  studies  CBC: Recent Labs  Lab 02/07/21 2215 02/08/21 1729 02/09/21 0622 02/10/21 0557 02/11/21 0519  WBC 9.6 7.2 6.4 6.2 9.2  NEUTROABS 7.1 5.2  --  4.3 6.8  HGB 15.2 12.4* 11.7* 11.3* 10.9*  HCT 41.2 34.3* 32.3* 30.8* 30.2*  MCV 101.0* 101.8* 102.2* 100.7* 102.0*  PLT 182 139* 140* 139* 152   Basic Metabolic Panel: Recent Labs  Lab 02/07/21 2215 02/08/21 1729 02/09/21 0622 02/10/21 0557 02/11/21 0519  NA 132* 137 138 134* 133*  K 3.7 3.0* 3.7 3.6 2.9*  CL 92* 101 103 103 103  CO2 GLUCOSE 156* 103* 80 111* 117*  BUN 34* 31* CREATININE 1.18 0.73 0.54* 0.82 0.79  CALCIUM 9.3  8.5* 8.5* 8.5* 8.3*  MG  --  1.7  --  1.5* 1.6*  PHOS  --  3.7  --  3.3 2.7   GFR: Estimated Creatinine Clearance: 80.9 mL/min (by C-G formula based on SCr of 0.79 mg/dL). Liver Function Tests: Recent Labs  Lab 02/07/21 2215 02/08/21 1729 02/09/21 0622 02/10/21 0557 02/11/21 0519  AST 162* 102* 91* 304* 144*  ALT 135* 98* 87* 155* 139*  ALKPHOS 168* 135* 122 157* 138*  BILITOT 13.1* 12.2* 12.0* 10.5* 6.4*  PROT 7.7 6.3* 5.7* 5.8* 5.5*  ALBUMIN 3.5 2.7* 2.6* 2.5* 2.4*   Recent Labs  Lab 02/07/21 2215 02/08/21 1729 02/09/21 1025 02/10/21 0557 02/11/21 0519  LIPASE 633* 255* 194* 161* 125*   No results for input(s): AMMONIA in the last 168 hours. Coagulation Profile: Recent Labs  Lab 02/09/21 1455 02/10/21 0557  INR 1.0 1.0   Cardiac Enzymes: No results for input(s): CKTOTAL, CKMB, CKMBINDEX, TROPONINI in the last 168 hours. BNP (last 3 results) No results for input(s): PROBNP in the last 8760 hours. HbA1C: No results for input(s): HGBA1C in the last 72 hours. CBG: Recent Labs  Lab 02/09/21 0720 02/10/21 0712 02/10/21 1125 02/10/21 1611 02/11/21 0736  GLUCAP 79 124* 132* 119* 121*   Lipid Profile: No results for input(s): CHOL, HDL, LDLCALC, TRIG, CHOLHDL, LDLDIRECT in the last 72 hours. Thyroid Function Tests: Recent Labs     02/08/21 1723  TSH 1.091   Anemia Panel: Recent Labs    02/11/21 0519  VITAMINB12 349  FOLATE 7.5  FERRITIN 297  TIBC 283  IRON 46  RETICCTPCT 1.2   Sepsis Labs: No results for input(s): PROCALCITON, LATICACIDVEN in the last 168 hours.  Recent Results (from the past 240 hour(s))  Resp Panel by RT-PCR (Flu A&B, Covid) Nasopharyngeal Swab     Status: None   Collection Time: 02/07/21 11:45 PM   Specimen: Nasopharyngeal Swab; Nasopharyngeal(NP) swabs in vial transport medium  Result Value Ref Range Status   SARS Coronavirus 2 by RT PCR NEGATIVE NEGATIVE Final    Comment: (NOTE) SARS-CoV-2 target nucleic acids are NOT DETECTED.  The SARS-CoV-2 RNA is generally detectable in upper respiratory specimens during the acute phase of infection. The lowest concentration of SARS-CoV-2 viral copies this assay can detect is 138 copies/mL. A negative result does not preclude SARS-Cov-2 infection and should not be used as the sole basis for treatment or other patient management decisions. A negative result may occur with  improper specimen collection/handling, submission of specimen other than nasopharyngeal swab, presence of viral mutation(s) within the areas targeted by this assay, and inadequate number of viral copies(<138 copies/mL). A negative result must be combined with clinical observations, patient history, and epidemiological information. The expected result is Negative.  Fact Sheet for Patients:  BloggerCourse.com  Fact Sheet for Healthcare Providers:  SeriousBroker.it  This test is no t yet approved or cleared by the [place] FDA and  has been authorized for detection and/or diagnosis of SARS-CoV-2 by FDA under an Emergency Use Authorization (EUA). This EUA will remain  in effect (meaning this test can be used) for the duration of the COVID-19 declaration under Section 564(b)(1) of the Act, 21 U.S.C.section  360bbb-3(b)(1), unless the authorization is terminated  or revoked sooner.       Influenza A by PCR NEGATIVE NEGATIVE Final   Influenza B by PCR NEGATIVE NEGATIVE Final    Comment: (NOTE) The Xpert Xpress SARS-CoV-2/FLU/RSV plus assay is intended as an aid in the  diagnosis of influenza from Nasopharyngeal swab specimens and should not be used as a sole basis for treatment. Nasal washings and aspirates are unacceptable for Xpert Xpress SARS-CoV-2/FLU/RSV testing.  Fact Sheet for Patients: BloggerCourse.comhttps://www.fda.gov/media/152166/download  Fact Sheet for Healthcare Providers: SeriousBroker.ithttps://www.fda.gov/media/152162/download  This test is not yet approved or cleared by the [place] FDA and has been authorized for detection and/or diagnosis of SARS-CoV-2 by FDA under an Emergency Use Authorization (EUA). This EUA will remain in effect (meaning this test can be used) for the duration of the COVID-19 declaration under Section 564(b)(1) of the Act, 21 U.S.C. section 360bbb-3(b)(1), unless the authorization is terminated or revoked.  Performed at [facility] [place], [address] [place], [place] [id number]     RN Pressure Injury Documentation:     Estimated body mass index is 20.77 kg/m as calculated from the following:   Height as of this encounter: 5\' 9"  (1.753 m).   Weight as of this encounter: 63.8 kg.  Malnutrition Type:   Malnutrition Characteristics:   Nutrition Interventions:    Radiology Studies: MR 3D Recon At Scanner  Result Date: 02/10/2021 CLINICAL DATA:  Jaundice, upper abdominal pain, nausea, weight loss, abnormal CT and ultrasound EXAM: MRI ABDOMEN WITHOUT AND WITH CONTRAST (INCLUDING MRCP) TECHNIQUE: Multiplanar multisequence MR imaging of the abdomen was performed both before and after the administration of intravenous contrast. Heavily T2-weighted images of the biliary and pancreatic ducts were obtained, and three-dimensional MRCP images were rendered by post  processing. CONTRAST:  6mL GADAVIST GADOBUTROL 1 MMOL/ML IV SOLN COMPARISON:  CT abdomen pelvis, 02/07/2021 FINDINGS: Lower chest: No acute findings. Hepatobiliary: No mass or other parenchymal abnormality identified. There is intra and extrahepatic biliary ductal dilatation, the central common bile duct measuring up to 1.2 cm in caliber. The common bile duct is abruptly obstructed in the central pancreatic head. Pancreas: There is diffuse inflammatory fat stranding and fluid about the pancreas. There is abrupt obstruction of the pancreatic and common bile ducts in the central pancreatic head. There is fullness of the pancreatic head, measuring approximately 3.9 x 3.8 cm, without discrete mass identified (series 25, image 54). There is no evidence of acute pancreatic fluid collection or parenchymal necrosis. The pancreatic duct is mildly prominent measuring of to 4 mm. Spleen:  Within normal limits in size and appearance. Adrenals/Urinary Tract: No masses identified. No evidence of hydronephrosis. Stomach/Bowel: Visualized portions within the abdomen are unremarkable. Vascular/Lymphatic: No pathologically enlarged lymph nodes identified. No abdominal aortic aneurysm demonstrated. Other:  Small volume perihepatic ascites. Musculoskeletal: No suspicious bone lesions identified. IMPRESSION: 1. Diffuse inflammatory fat stranding and fluid about the pancreas, consistent with acute pancreatitis. 2. Intra and extrahepatic biliary ductal dilatation, with abrupt obstruction of the common bile duct in the central pancreatic head. There is fullness of the pancreatic head, measuring approximately 3.9 x 3.8 cm, without discrete mass identified. These findings are concerning for underlying pancreatic adenocarcinoma. 3. Small volume perihepatic ascites. Electronically Signed   By: Lauralyn PrimesAlex  Bibbey M.D.   On: 02/10/2021 14:54   MR ABDOMEN MRCP W WO CONTAST  Result Date: 02/10/2021 CLINICAL DATA:  Jaundice, upper abdominal pain,  nausea, weight loss, abnormal CT and ultrasound EXAM: MRI ABDOMEN WITHOUT AND WITH CONTRAST (INCLUDING MRCP) TECHNIQUE: Multiplanar multisequence MR imaging of the abdomen was performed both before and after the administration of intravenous contrast. Heavily T2-weighted images of the biliary and pancreatic ducts were obtained, and three-dimensional MRCP images were rendered by post processing. CONTRAST:  6mL GADAVIST GADOBUTROL 1  MMOL/ML IV SOLN COMPARISON:  CT abdomen pelvis, 02/07/2021 FINDINGS: Lower chest: No acute findings. Hepatobiliary: No mass or other parenchymal abnormality identified. There is intra and extrahepatic biliary ductal dilatation, the central common bile duct measuring up to 1.2 cm in caliber. The common bile duct is abruptly obstructed in the central pancreatic head. Pancreas: There is diffuse inflammatory fat stranding and fluid about the pancreas. There is abrupt obstruction of the pancreatic and common bile ducts in the central pancreatic head. There is fullness of the pancreatic head, measuring approximately 3.9 x 3.8 cm, without discrete mass identified (series 25, image 54). There is no evidence of acute pancreatic fluid collection or parenchymal necrosis. The pancreatic duct is mildly prominent measuring of to 4 mm. Spleen:  Within normal limits in size and appearance. Adrenals/Urinary Tract: No masses identified. No evidence of hydronephrosis. Stomach/Bowel: Visualized portions within the abdomen are unremarkable. Vascular/Lymphatic: No pathologically enlarged lymph nodes identified. No abdominal aortic aneurysm demonstrated. Other:  Small volume perihepatic ascites. Musculoskeletal: No suspicious bone lesions identified. IMPRESSION: 1. Diffuse inflammatory fat stranding and fluid about the pancreas, consistent with acute pancreatitis. 2. Intra and extrahepatic biliary ductal dilatation, with abrupt obstruction of the common bile duct in the central pancreatic head. There is fullness  of the pancreatic head, measuring approximately 3.9 x 3.8 cm, without discrete mass identified. These findings are concerning for underlying pancreatic adenocarcinoma. 3. Small volume perihepatic ascites. Electronically Signed   By: Lauralyn Primes M.D.   On: 02/10/2021 14:54   Scheduled Meds: . enoxaparin (LOVENOX) injection  40 mg Subcutaneous Q24H  . folic acid  1 mg Oral Daily  . LORazepam  0-4 mg Intravenous Q12H  . multivitamin with minerals  1 tablet Oral Daily  . pantoprazole (PROTONIX) IV  40 mg Intravenous Q24H  . potassium chloride  40 mEq Oral BID  . thiamine  100 mg Oral Daily   Or  . thiamine  100 mg Intravenous Daily   Continuous Infusions: . sodium chloride 100 mL/hr at 02/10/21 2148  . potassium chloride 10 mEq (02/11/21 1244)    LOS: 3 days   Merlene Laughter, DO [facility] PAGER is on AMION  If 7PM-7AM, please contact night-coverage [email or web address]

## 2021-02-11 NOTE — Progress Notes (Addendum)
[facility] Gastroenterology Progress Note  Jared Duran 70 y.o. 01-Dec-1950  CC: Pancreatitis   Subjective: Patient seen and examined at bedside.  He is feeling better.  Abdominal pain has improved significantly.  Tolerating diet.  ROS : Afebrile, negative for chest pain   Objective: Vital signs in last 24 hours: Vitals:   02/10/21 2014 02/11/21 0514  BP: (!) 146/97 122/89  Pulse: 80 82  Resp: 16 20  Temp: 98.3 F (36.8 C) 99.3 F (37.4 C)  SpO2: 98% 98%    Physical Exam:  General:  Alert, cooperative, no distress, appears stated age  Head:  Normocephalic, without obvious abnormality, atraumatic  Eyes:  , EOM's intact,   Lungs:    No visible respiratory distress  Heart:  Regular rate and rhythm, S1, S2 normal  Abdomen:   Soft, non-tender, nondistended, bowel sounds present  Extremities: Extremities normal, atraumatic, no  edema  Psych:  Mood and affect normal    Lab Results: Recent Labs    02/10/21 0557 02/11/21 0519  NA 134* 133*  K 3.6 2.9*  CL 103 103  CO2 26 24  GLUCOSE 111* 117*  BUN 11 8  CREATININE 0.82 0.79  CALCIUM 8.5* 8.3*  MG 1.5* 1.6*  PHOS 3.3 2.7   Recent Labs    02/10/21 0557 02/11/21 0519  AST 304* 144*  ALT 155* 139*  ALKPHOS 157* 138*  BILITOT 10.5* 6.4*  PROT 5.8* 5.5*  ALBUMIN 2.5* 2.4*   Recent Labs    02/10/21 0557 02/11/21 0519  WBC 6.2 9.2  NEUTROABS 4.3 6.8  HGB 11.3* 10.9*  HCT 30.8* 30.2*  MCV 100.7* 102.0*  PLT 139* 152   Recent Labs    02/09/21 1455 02/10/21 0557  LABPROT 12.8 12.9  INR 1.0 1.0      Assessment/Plan: -Acute pancreatitis.  MRI MRCP with and without contrast yesterday showed pancreatitis as well as around 3.9 cm area of fullness in the pancreatic head causing abrupt obstruction of the common bile duct.  Patient is also complaining of 20 pound weight loss in last 2 months. -Abnormal LFTs with jaundice.  Secondary to above.   Improving.  Recommendation -------------------------- -Continue current management.  Continue soft diet. -I  will discuss with Dr. Dulce Sellar and  Dr. Ewing Schlein regarding plan for EUS along with ERCP. - Check CA 19-9 -Repeat LFTs in the morning -GI will follow  Kathi Der MD, FACP 02/11/2021, 8:47 AM  Contact #  [phone]

## 2021-02-12 DIAGNOSIS — K852 Alcohol induced acute pancreatitis without necrosis or infection: Principal | ICD-10-CM

## 2021-02-12 DIAGNOSIS — R945 Abnormal results of liver function studies: Secondary | ICD-10-CM

## 2021-02-12 LAB — CBC WITH DIFFERENTIAL/PLATELET
Abs Immature Granulocytes: 0.09 10*3/uL — ABNORMAL HIGH (ref 0.00–0.07)
Basophils Absolute: 0 10*3/uL (ref 0.0–0.1)
Basophils Relative: 0 %
Eosinophils Absolute: 0.1 10*3/uL (ref 0.0–0.5)
Eosinophils Relative: 2 %
HCT: 28.8 % — ABNORMAL LOW (ref 39.0–52.0)
Hemoglobin: 9.8 g/dL — ABNORMAL LOW (ref 13.0–17.0)
Immature Granulocytes: 1 %
Lymphocytes Relative: 22 %
Lymphs Abs: 1.5 10*3/uL (ref 0.7–4.0)
MCH: 36.6 pg — ABNORMAL HIGH (ref 26.0–34.0)
MCHC: 34 g/dL (ref 30.0–36.0)
MCV: 107.5 fL — ABNORMAL HIGH (ref 80.0–100.0)
Monocytes Absolute: 0.7 10*3/uL (ref 0.1–1.0)
Monocytes Relative: 10 %
Neutro Abs: 4.6 10*3/uL (ref 1.7–7.7)
Neutrophils Relative %: 65 %
Platelets: 159 10*3/uL (ref 150–400)
RBC: 2.68 MIL/uL — ABNORMAL LOW (ref 4.22–5.81)
RDW: 16 % — ABNORMAL HIGH (ref 11.5–15.5)
WBC: 7 10*3/uL (ref 4.0–10.5)
nRBC: 0 % (ref 0.0–0.2)

## 2021-02-12 LAB — COMPREHENSIVE METABOLIC PANEL
ALT: 94 U/L — ABNORMAL HIGH (ref 0–44)
AST: 53 U/L — ABNORMAL HIGH (ref 15–41)
Albumin: 2.4 g/dL — ABNORMAL LOW (ref 3.5–5.0)
Alkaline Phosphatase: 122 U/L (ref 38–126)
Anion gap: 6 (ref 5–15)
BUN: 12 mg/dL (ref 8–23)
CO2: 22 mmol/L (ref 22–32)
Calcium: 8.1 mg/dL — ABNORMAL LOW (ref 8.9–10.3)
Chloride: 108 mmol/L (ref 98–111)
Creatinine, Ser: 1.08 mg/dL (ref 0.61–1.24)
GFR, Estimated: >60 mL/min (ref >60)
Glucose, Bld: 118 mg/dL — ABNORMAL HIGH (ref 70–99)
Potassium: 3.9 mmol/L (ref 3.5–5.1)
Sodium: 136 mmol/L (ref 135–145)
Total Bilirubin: 4 mg/dL — ABNORMAL HIGH (ref 0.3–1.2)
Total Protein: 5.7 g/dL — ABNORMAL LOW (ref 6.5–8.1)

## 2021-02-12 LAB — GLUCOSE, CAPILLARY: Glucose-Capillary: 122 mg/dL — ABNORMAL HIGH (ref 70–99)

## 2021-02-12 LAB — PHOSPHORUS: Phosphorus: 3.5 mg/dL (ref 2.5–4.6)

## 2021-02-12 LAB — LIPASE, BLOOD: Lipase: 200 U/L — ABNORMAL HIGH (ref 11–51)

## 2021-02-12 LAB — MAGNESIUM: Magnesium: 2.2 mg/dL (ref 1.7–2.4)

## 2021-02-12 LAB — CANCER ANTIGEN 19-9: CA 19-9: 64 U/mL — ABNORMAL HIGH (ref 0–35)

## 2021-02-12 MED ORDER — THIAMINE HCL 100 MG PO TABS: 100.0000 mg | ORAL_TABLET | Freq: Every day | ORAL | 0 refills | Status: DC

## 2021-02-12 MED ORDER — BISACODYL 10 MG RE SUPP: 10.0000 mg | Freq: Every day | RECTAL | 0 refills | Status: DC | PRN

## 2021-02-12 MED ORDER — POLYETHYLENE GLYCOL 3350 17 G PO PACK: 17.0000 g | PACK | Freq: Every day | ORAL | 0 refills | Status: DC | PRN

## 2021-02-12 MED ORDER — ONDANSETRON HCL 4 MG PO TABS: 4.0000 mg | ORAL_TABLET | Freq: Four times a day (QID) | ORAL | 0 refills | Status: DC | PRN

## 2021-02-12 MED ORDER — ADULT MULTIVITAMIN W/MINERALS CH: 1.0000 | ORAL_TABLET | Freq: Every day | ORAL | 0 refills | Status: DC

## 2021-02-12 MED ORDER — OXYCODONE HCL 5 MG PO TABS: 5.0000 mg | ORAL_TABLET | Freq: Four times a day (QID) | ORAL | 0 refills | Status: DC | PRN

## 2021-02-12 NOTE — Discharge Summary (Signed)
Physician Discharge Summary  Jared Duran:[id number] DOB: 1950/12/26 DOA: 02/07/2021  PCP: Brayton El, PA-C  Admit date: 02/07/2021 Discharge date: 02/12/2021  Admitted From: Home Disposition: Home  Recommendations for Outpatient Follow-up:  1. Follow up with PCP in 1-2 weeks] 2. Follow up with Gastroenterology within 1-2 weeks and have EUS/ERCP as an outpatient  3. Please obtain CMP/CBC,Mag Phos in one week 4. Please follow up on the following pending results:  Home Health: No Equipment/Devices: None  Discharge Condition: Stable  CODE STATUS: FULL CODE Diet recommendation: Heart Healthy Diet  Brief/Interim Summary: Jared Duran a 70 y.o.malewith medical history significantfor but not limited toohypertension, history of pancreatitis, alcohol abuse as well as other comorbidities who presented with a 1 week worsening abdominal discomfort, dark urine and a recent history of rapid unintentional weight loss. Patient states that he started feeling bad last week after cutting 5 lawns and started having abdominal pain then. He states that he drank the day before. He progressively has had poor p.o. intake and continued to have abdominal pain to the point that he ended up coming here. He is noted to have elevated LFTs and elevated lipase. States that this is happened to him in the past before and he states that he drinks 5 beers a day with his last beer being the day before he started having abdominal pain. He has had darker urine for about a month and did not realize that his eyes were yellow until today. He denies any recent travel and no chest pain, shortness of breath cough or diarrhea. He states that he is lost weight but does not know how much.TRH was asked to admit this patient for acute alcoholic pancreatitis and elevated hyperbilirubinemia  ED Course:Patient went to med center [facility] and was given IV fluid hydration and pain management. He had basic  blood work done and a COVID testing which was negative. Also had a lipase level which was elevated and a CT renal stone study  **Interim History Patient's lipase is trending down further To 125 but T bili remains significantly elevated and is slowly trending down. LFT's worsening again  GI has been consulted and awaiting further evaluation recommendations.  His diet was advanced to a soft diet and he is tolerating this well however subsequently also went under MRCP.  Showed a 3.9 x 3.8 cm area of fullness in the pancreatic head causing abrupt obstruction of the common bile duct.  Given his 20 pound weight loss in the last few months there is concern for malignancy so GI is checking a CA 19-9 and discussing about getting an EUS along with an ERCP. CA 19-9 was elevated at 64 and gastroenterology now recommending outpatient EUS with ERCP in a few weeks.  GI plan no further inpatient work-up so they deemed him stable to be discharged from their perspective and patient was tolerating a diet without issues.  Labs are trending down so he is deemed medically stable to be discharged home and follow-up with gastroenterology in outpatient setting in 1 to 2 weeks for EUS and ERCP probably given the likelihood that he has pancreatic cancer.  Discharge Diagnoses:  Active Problems:   Alcoholic pancreatitis  Acute Alcoholic Pancreatitis, improving Abnormal LFTs, initially improving but now worsening  Hyperbilirubinemia, improving slowly however there is now concern for obstruction of the common bile duct with an area of 3.9 x 3.8 fullness in the pancreatic head concern for malignancy -Presented with Abdominal Painx1 week -His lipase level on admission  was 633 and trended down to 125 -CT renal stone study showed"Peripancreatic edema and fat stranding consistent with acute pancreatitis. No focal fluid collection. No renal stones or obstructive uropathy. Colonic diverticulosis without diverticulitis" -AST trended  down to 53 and ALT trended down to 94 now -Patient's T bili was 13.1 and trended down to 4.0 today -Will check a PT-INR to calculate Maddrey Discrimant Fxn; PT-INR was 12.9 and INR was 1.0; -Maddery Discriminant Fxn Score was 8.7 and had good Prognosis for Glucocorticoid therapy; Will Defer to GI to tart Prednisolone  -He was given a bolus of normal saline initially and was made NPO.  Diet has been fully advanced to a soft diet now -Pain control and maintenance IV fluids with normal saline at 125 mL but will reduce rate to 100 mL/hr -We will check a right upper quadrant ultrasound and this showed "No focal lesion identified. Within normal limits in parenchymal echogenicity. Portal vein is patent on color Doppler imaging with normal direction of blood flow towards the liver." but did show a 4 mm gall bladder polyp -Given his significantly elevated bilirubin and alcoholic pancreatitis we have consulted GI for further evaluation recommendations and appreciate recommendations -GI recommending medical management with Hydration, Judicious Analagesics, and Minimizind diet and not advancing past a Liquid Diet but has now advanced to a soft diet. GI recommending also checking Acute Hepatitis Panel and feel the patient may end up with an MRI/MRCP this Admission  -Acute hepatitis panel done and is negative -MRI MRCP done and showed "Diffuse inflammatory fat stranding and fluid about the pancreas, consistent with acute pancreatitis. Intra and extrahepatic biliary ductal dilatation, with abrupt obstruction of the common bile duct in the central pancreatic head. There is fullness of the pancreatic head, measuring approximately 3.9 x 3.8 cm, without discrete mass identified. These findings are concerning for underlying pancreatic adenocarcinoma. Small volume perihepatic ascites." -GI discussing about getting an EUS with ERCP and also checking a CA 19-9 level.  CA 19 9 level was elevated to 64.  Now general surgery  recommending outpatient EUS in a few weeks followed by ERCP given the evidence of recent pancreatitis -Follow-up with gastroenterology in outpatient setting  Hypertension -Held home p.o. medications given his n.p.o. status and slow diet advancement.  Started on IV hydralazine 10 mg every 6 as needed for systolic blood pressure greater than 160 or DBP greater than 100 -Continue to Monitor blood pressures per protocol -Last BP reading was  140/93 prior to discharge -We will consider resuming losartan 100 mg p.o. daily in the a.m. as well as nifedipine 20 mg p.o. 3 times daily but his blood pressure is on the softer side yesterday today but will resume at discharge  Hyponatremia -Improved. Patient sodium went from 132 -> 137 -> 138 -> 134 and yesterday was 133 and today is 136 -Continue monitor and trend and repeat CMP in a.m.  Alcoholism with concern for withdrawal -CIWA and folic acid, multivitamin and thiamine -Monitor for signs and symptoms of withdrawal  Macrocytic Anemia -In the setting of alcohol abuse The patient's hemoglobin/hematocrit went from 15.2/41.2 and was likely hemoconcentrated on admission is now trended down to 9.8/20.8 in the setting of IV fluid hydration -Check Anemia Panel and showed an iron level of 46, U IBC 237, TIBC of 283, saturation ratios of 16%, ferritin level 297, folate level 7.5 -Continue to monitor for signs and symptoms of bleeding; currently no overt bleeding noted -Repeat CBC within 1 week  Hypokalemia -Patient's potassium this  morning was 2.9 and improved to 3.9 today -Replete with IV KCl 30 mEq as well as p.o. potassium chloride 40 mill colons twice daily x2 doses -Mag level was low at 1.6 but after repletion was 2.2 -Continue monitor and replete as necessary -Repeat CMP within 1 week  Hypomagnesemia -Patient's Mag Level was 1.6 and improved to 2.2 -Replete with IV Mag Sulfate 2 grams again yesterday -Continue to Monitor and Replete as  Necessary -Repeat Mag Level in 1 week  GERD -Resume PPI at discharge  Thromboyctopenia -Patient's Platelet Count went from 182 -> 139 -> 140 -> 139 and 1-1 52 yesterday and this morning is 159 -Continue to Monitor for S/Sx of Bleeding -Repeat CBC in the AM  Discharge Instructions  Discharge Instructions    Call MD for:  difficulty breathing, headache or visual disturbances   Complete by: As directed    Call MD for:  extreme fatigue   Complete by: As directed    Call MD for:  hives   Complete by: As directed    Call MD for:  persistant dizziness or light-headedness   Complete by: As directed    Call MD for:  persistant nausea and vomiting   Complete by: As directed    Call MD for:  redness, tenderness, or signs of infection (pain, swelling, redness, odor or green/yellow discharge around incision site)   Complete by: As directed    Call MD for:  severe uncontrolled pain   Complete by: As directed    Call MD for:  temperature >100.4   Complete by: As directed    Diet - low sodium heart healthy   Complete by: As directed    Discharge instructions   Complete by: As directed    You were cared for by a hospitalist during your hospital stay. If you have any questions about your discharge medications or the care you received while you were in the hospital after you are discharged, you can call the unit and ask to speak with the hospitalist on call if the hospitalist that took care of you is not available. Once you are discharged, your primary care physician will handle any further medical issues. Please note that NO REFILLS for any discharge medications will be authorized once you are discharged, as it is imperative that you return to your primary care physician (or establish a relationship with a primary care physician if you do not have one) for your aftercare needs so that they can reassess your need for medications and monitor your lab values.  Follow up with PCP and  Gastroenterology within 1-2 weeks for EUS/ERCP. Take all medications as prescribed. If symptoms change or worsen please return to the ED for evaluation   Increase activity slowly   Complete by: As directed      Allergies as of 02/12/2021   No Known Allergies     Medication List    TAKE these medications   bisacodyl 10 MG suppository Commonly known as: DULCOLAX Place 1 suppository (10 mg total) rectally daily as needed for moderate constipation.   folic acid 1 MG tablet Commonly known as: FOLVITE Take 1 tablet (1 mg total) by mouth daily.   losartan 100 MG tablet Commonly known as: COZAAR Take 100 mg by mouth daily.   multivitamin with minerals Tabs tablet Take 1 tablet by mouth daily.   NIFEdipine 60 MG 24 hr tablet Commonly known as: PROCARDIA XL/NIFEDICAL XL Take 60 mg by mouth daily. What changed: Another medication with  the same name was removed. Continue taking this medication, and follow the directions you see here.   ondansetron 4 MG tablet Commonly known as: ZOFRAN Take 1 tablet (4 mg total) by mouth every 6 (six) hours as needed for nausea.   oxyCODONE 5 MG immediate release tablet Commonly known as: Oxy IR/ROXICODONE Take 1 tablet (5 mg total) by mouth every 6 (six) hours as needed for moderate pain.   pantoprazole 40 MG tablet Commonly known as: PROTONIX Take 40 mg by mouth daily.   polyethylene glycol 17 g packet Commonly known as: MIRALAX / GLYCOLAX Take 17 g by mouth daily as needed for mild constipation.   thiamine 100 MG tablet Take 1 tablet (100 mg total) by mouth daily.       Follow-up Information    Willis Modena, MD. Call in 1 week(s).   Specialty: Gastroenterology Why: To schedule EUS Contact information: [address] [place] [address] [phone]              No Known Allergies  Consultations:  Gastroenterology  Procedures/Studies: MR 3D Recon At Scanner  Result Date: 02/10/2021 CLINICAL DATA:   Jaundice, upper abdominal pain, nausea, weight loss, abnormal CT and ultrasound EXAM: MRI ABDOMEN WITHOUT AND WITH CONTRAST (INCLUDING MRCP) TECHNIQUE: Multiplanar multisequence MR imaging of the abdomen was performed both before and after the administration of intravenous contrast. Heavily T2-weighted images of the biliary and pancreatic ducts were obtained, and three-dimensional MRCP images were rendered by post processing. CONTRAST:  6mL GADAVIST GADOBUTROL 1 MMOL/ML IV SOLN COMPARISON:  CT abdomen pelvis, 02/07/2021 FINDINGS: Lower chest: No acute findings. Hepatobiliary: No mass or other parenchymal abnormality identified. There is intra and extrahepatic biliary ductal dilatation, the central common bile duct measuring up to 1.2 cm in caliber. The common bile duct is abruptly obstructed in the central pancreatic head. Pancreas: There is diffuse inflammatory fat stranding and fluid about the pancreas. There is abrupt obstruction of the pancreatic and common bile ducts in the central pancreatic head. There is fullness of the pancreatic head, measuring approximately 3.9 x 3.8 cm, without discrete mass identified (series 25, image 54). There is no evidence of acute pancreatic fluid collection or parenchymal necrosis. The pancreatic duct is mildly prominent measuring of to 4 mm. Spleen:  Within normal limits in size and appearance. Adrenals/Urinary Tract: No masses identified. No evidence of hydronephrosis. Stomach/Bowel: Visualized portions within the abdomen are unremarkable. Vascular/Lymphatic: No pathologically enlarged lymph nodes identified. No abdominal aortic aneurysm demonstrated. Other:  Small volume perihepatic ascites. Musculoskeletal: No suspicious bone lesions identified. IMPRESSION: 1. Diffuse inflammatory fat stranding and fluid about the pancreas, consistent with acute pancreatitis. 2. Intra and extrahepatic biliary ductal dilatation, with abrupt obstruction of the common bile duct in the central  pancreatic head. There is fullness of the pancreatic head, measuring approximately 3.9 x 3.8 cm, without discrete mass identified. These findings are concerning for underlying pancreatic adenocarcinoma. 3. Small volume perihepatic ascites. Electronically Signed   By: Lauralyn Primes M.D.   On: 02/10/2021 14:54   MR ABDOMEN MRCP W WO CONTAST  Result Date: 02/10/2021 CLINICAL DATA:  Jaundice, upper abdominal pain, nausea, weight loss, abnormal CT and ultrasound EXAM: MRI ABDOMEN WITHOUT AND WITH CONTRAST (INCLUDING MRCP) TECHNIQUE: Multiplanar multisequence MR imaging of the abdomen was performed both before and after the administration of intravenous contrast. Heavily T2-weighted images of the biliary and pancreatic ducts were obtained, and three-dimensional MRCP images were rendered by post processing. CONTRAST:  6mL GADAVIST  GADOBUTROL 1 MMOL/ML IV SOLN COMPARISON:  CT abdomen pelvis, 02/07/2021 FINDINGS: Lower chest: No acute findings. Hepatobiliary: No mass or other parenchymal abnormality identified. There is intra and extrahepatic biliary ductal dilatation, the central common bile duct measuring up to 1.2 cm in caliber. The common bile duct is abruptly obstructed in the central pancreatic head. Pancreas: There is diffuse inflammatory fat stranding and fluid about the pancreas. There is abrupt obstruction of the pancreatic and common bile ducts in the central pancreatic head. There is fullness of the pancreatic head, measuring approximately 3.9 x 3.8 cm, without discrete mass identified (series 25, image 54). There is no evidence of acute pancreatic fluid collection or parenchymal necrosis. The pancreatic duct is mildly prominent measuring of to 4 mm. Spleen:  Within normal limits in size and appearance. Adrenals/Urinary Tract: No masses identified. No evidence of hydronephrosis. Stomach/Bowel: Visualized portions within the abdomen are unremarkable. Vascular/Lymphatic: No pathologically enlarged lymph nodes  identified. No abdominal aortic aneurysm demonstrated. Other:  Small volume perihepatic ascites. Musculoskeletal: No suspicious bone lesions identified. IMPRESSION: 1. Diffuse inflammatory fat stranding and fluid about the pancreas, consistent with acute pancreatitis. 2. Intra and extrahepatic biliary ductal dilatation, with abrupt obstruction of the common bile duct in the central pancreatic head. There is fullness of the pancreatic head, measuring approximately 3.9 x 3.8 cm, without discrete mass identified. These findings are concerning for underlying pancreatic adenocarcinoma. 3. Small volume perihepatic ascites. Electronically Signed   By: Lauralyn PrimesAlex  Bibbey M.D.   On: 02/10/2021 14:54   CT Renal Stone Study  Result Date: 02/07/2021 CLINICAL DATA:  Flank pain. Severe abdominal pain with nausea. Weight loss. EXAM: CT ABDOMEN AND PELVIS WITHOUT CONTRAST TECHNIQUE: Multidetector CT imaging of the abdomen and pelvis was performed following the standard protocol without IV contrast. COMPARISON:  Most recent abdominal CT 04/10/2020 FINDINGS: Lower chest: No acute airspace disease or pleural effusion. Normal heart size. Hepatobiliary: Scattered small hepatic hypodensities are similar to prior exam, likely cysts. Gallbladder physiologically distended, no calcified stone. No biliary dilatation. Pancreas: Peripancreatic edema extending from the head/uncinate process through the body. No definite ductal dilatation or focal fluid collection. Small amount of ill-defined retroperitoneal fluid. Spleen: Normal in size without focal abnormality. Adrenals/Urinary Tract: No adrenal nodule. No hydronephrosis or renal calculi. Mild left greater than right perinephric edema. Small cyst in the right upper kidney. Decompressed ureters. Minimally distended urinary bladder. Mild wall thickening is likely due to degree of distension. Stomach/Bowel: Decompressed stomach. No small bowel obstruction or inflammation. Fecalization of distal  small bowel contents. Normal appendix tentatively visualized. Small to moderate colonic stool burden. There is colonic tortuosity. Descending and sigmoid diverticulosis without diverticulitis. Vascular/Lymphatic: Moderate aortic atherosclerosis. No aortic aneurysm. No portal venous or mesenteric gas. No bulky abdominopelvic adenopathy. Reproductive: Occasional prostatic calcifications. Other: No free air. Peripancreatic fat stranding and edema with small amount of free fluid. No other ascites. Tiny fat containing umbilical hernia. Musculoskeletal: There are no acute or suspicious osseous abnormalities. IMPRESSION: 1. Peripancreatic edema and fat stranding consistent with acute pancreatitis. No focal fluid collection. 2. No renal stones or obstructive uropathy. 3. Colonic diverticulosis without diverticulitis. Aortic Atherosclerosis (ICD10-I70.0). Electronically Signed   By: Narda RutherfordMelanie  Sanford M.D.   On: 02/07/2021 23:28   US Abdomen Limited RUQ (LIVER/GB)  Result Date: 02/08/2021 CLINICAL DATA:  Abnormal liver function tests. EXAM: ULTRASOUND ABDOMEN LIMITED RIGHT UPPER QUADRANT COMPARISON:  None. FINDINGS: Gallbladder: A 4 mm nonshadowing echogenic focus is seen along the nondependent portion of the gallbladder wall. No gallstones  or wall thickening visualized (1.9 mm). No sonographic Murphy sign noted by sonographer. Common bile duct: Diameter: 8.0 mm Liver: No focal lesion identified. Within normal limits in parenchymal echogenicity. Portal vein is patent on color Doppler imaging with normal direction of blood flow towards the liver. Other: None. IMPRESSION: 4 mm gallbladder polyp, likely benign in nature. No further evaluation or follow-up is recommended. This recommendation follows ACR consensus guidelines: White Paper of the ACR Incidental Findings Committee II on Gallbladder and Biliary Findings. J Am Coll Radiol 2013:;10:953-956. Electronically Signed   By: Aram Candela M.D.   On: 02/08/2021 19:46     Subjective: Seen and examined at bedside and he is doing fairly well.  He denies any nausea or vomiting.  Tolerated his diet without issues.  Bilirubin is trending down.  He had no issues prior to discharge and he will be following up with gastroenterology in 1 to 2 weeks for outpatient EUS and ERCP.  Discharge Exam: Vitals:   02/11/21 2021 02/12/21 0452  BP: 128/87 (!) 140/93  Pulse: 70 72  Resp: 18 18  Temp: 97.9 F (36.6 C) 98 F (36.7 C)  SpO2: 100% 99%   Vitals:   02/11/21 1337 02/11/21 2021 02/12/21 0452 02/12/21 0500  BP: 130/89 128/87 (!) 140/93   Pulse: 77 70 72   Resp: (!) 22 18 18    Temp: 98 F (36.7 C) 97.9 F (36.6 C) 98 F (36.7 C)   TempSrc: Oral     SpO2: 99% 100% 99%   Weight:    64.4 kg  Height:       General: Pt is alert, awake, not in acute distress; has sclera that is icteric Cardiovascular: RRR, S1/S2 +, no rubs, no gallops Respiratory: Diminished bilaterally, no wheezing, no rhonchi Abdominal: Soft, NT, slightly distended, bowel sounds + Extremities: no appreciable edema, no cyanosis  The results of significant diagnostics from this hospitalization (including imaging, microbiology, ancillary and laboratory) are listed below for reference.    Microbiology: Recent Results (from the past 240 hour(s))  Resp Panel by RT-PCR (Flu A&B, Covid) Nasopharyngeal Swab     Status: None   Collection Time: 02/07/21 11:45 PM   Specimen: Nasopharyngeal Swab; Nasopharyngeal(NP) swabs in vial transport medium  Result Value Ref Range Status   SARS Coronavirus 2 by RT PCR NEGATIVE NEGATIVE Final    Comment: (NOTE) SARS-CoV-2 target nucleic acids are NOT DETECTED.  The SARS-CoV-2 RNA is generally detectable in upper respiratory specimens during the acute phase of infection. The lowest concentration of SARS-CoV-2 viral copies this assay can detect is 138 copies/mL. A negative result does not preclude SARS-Cov-2 infection and should not be used as the sole basis  for treatment or other patient management decisions. A negative result may occur with  improper specimen collection/handling, submission of specimen other than nasopharyngeal swab, presence of viral mutation(s) within the areas targeted by this assay, and inadequate number of viral copies(<138 copies/mL). A negative result must be combined with clinical observations, patient history, and epidemiological information. The expected result is Negative.  Fact Sheet for Patients:  04/09/21  Fact Sheet for Healthcare Providers:  BloggerCourse.com  This test is no t yet approved or cleared by the [place] FDA and  has been authorized for detection and/or diagnosis of SARS-CoV-2 by FDA under an Emergency Use Authorization (EUA). This EUA will remain  in effect (meaning this test can be used) for the duration of the COVID-19 declaration under Section 564(b)(1) of the Act, 21 U.S.C.section  360bbb-3(b)(1), unless the authorization is terminated  or revoked sooner.       Influenza A by PCR NEGATIVE NEGATIVE Final   Influenza B by PCR NEGATIVE NEGATIVE Final    Comment: (NOTE) The Xpert Xpress SARS-CoV-2/FLU/RSV plus assay is intended as an aid in the diagnosis of influenza from Nasopharyngeal swab specimens and should not be used as a sole basis for treatment. Nasal washings and aspirates are unacceptable for Xpert Xpress SARS-CoV-2/FLU/RSV testing.  Fact Sheet for Patients: BloggerCourse.com  Fact Sheet for Healthcare Providers: SeriousBroker.it  This test is not yet approved or cleared by the [place] FDA and has been authorized for detection and/or diagnosis of SARS-CoV-2 by FDA under an Emergency Use Authorization (EUA). This EUA will remain in effect (meaning this test can be used) for the duration of the COVID-19 declaration under Section 564(b)(1) of the Act, 21  U.S.C. section 360bbb-3(b)(1), unless the authorization is terminated or revoked.  Performed at [facility], [address] [place], [place] [address]     Labs: BNP (last 3 results) No results for input(s): BNP in the last 8760 hours. Basic Metabolic Panel: Recent Labs  Lab 02/08/21 1729 02/09/21 0622 02/10/21 0557 02/11/21 0519 02/12/21 0514  NA 137 138 134* 133* 136  K 3.0* 3.7 3.6 2.9* 3.9  CL 101 103 103 103 108  CO2 GLUCOSE 103* 80 111* 117* 118*  BUN 31* CREATININE 0.73 0.54* 0.82 0.79 1.08  CALCIUM 8.5* 8.5* 8.5* 8.3* 8.1*  MG 1.7  --  1.5* 1.6* 2.2  PHOS 3.7  --  3.3 2.7 3.5   Liver Function Tests: Recent Labs  Lab 02/08/21 1729 02/09/21 0622 02/10/21 0557 02/11/21 0519 02/12/21 0514  AST 102* 91* 304* 144* 53*  ALT 98* 87* 155* 139* 94*  ALKPHOS 135* 122 157* 138* 122  BILITOT 12.2* 12.0* 10.5* 6.4* 4.0*  PROT 6.3* 5.7* 5.8* 5.5* 5.7*  ALBUMIN 2.7* 2.6* 2.5* 2.4* 2.4*   Recent Labs  Lab 02/08/21 1729 02/09/21 1025 02/10/21 0557 02/11/21 0519 02/12/21 0514  LIPASE 255* 194* 161* 125* 200*   No results for input(s): AMMONIA in the last 168 hours. CBC: Recent Labs  Lab 02/07/21 2215 02/08/21 1729 02/09/21 0622 02/10/21 0557 02/11/21 0519 02/12/21 0514  WBC 9.6 7.2 6.4 6.2 9.2 7.0  NEUTROABS 7.1 5.2  --  4.3 6.8 4.6  HGB 15.2 12.4* 11.7* 11.3* 10.9* 9.8*  HCT 41.2 34.3* 32.3* 30.8* 30.2* 28.8*  MCV 101.0* 101.8* 102.2* 100.7* 102.0* 107.5*  PLT 182 139* 140* 139* 152 159   Cardiac Enzymes: No results for input(s): CKTOTAL, CKMB, CKMBINDEX, TROPONINI in the last 168 hours. BNP: Invalid input(s): POCBNP CBG: Recent Labs  Lab 02/10/21 0712 02/10/21 1125 02/10/21 1611 02/11/21 0736 02/12/21 0751  GLUCAP 124* 132* 119* 121* 122*   D-Dimer No results for input(s): DDIMER in the last 72 hours. Hgb A1c No results for input(s): HGBA1C in the last 72 hours. Lipid Profile No results for  input(s): CHOL, HDL, LDLCALC, TRIG, CHOLHDL, LDLDIRECT in the last 72 hours. Thyroid function studies No results for input(s): TSH, T4TOTAL, T3FREE, THYROIDAB in the last 72 hours.  Invalid input(s): FREET3 Anemia work up Recent Labs    02/11/21 0519  VITAMINB12 349  FOLATE 7.5  FERRITIN 297  TIBC 283  IRON 46  RETICCTPCT 1.2   Urinalysis    Component Value Date/Time   COLORURINE BROWN (A) 02/08/2021 0222  APPEARANCEUR CLEAR 02/08/2021 0222   LABSPEC 1.020 02/08/2021 0222   PHURINE 6.5 02/08/2021 0222   GLUCOSEU 100 (A) 02/08/2021 0222   HGBUR TRACE (A) 02/08/2021 0222   BILIRUBINUR LARGE (A) 02/08/2021 0222   KETONESUR 15 (A) 02/08/2021 0222   PROTEINUR >300 (A) 02/08/2021 0222   NITRITE POSITIVE (A) 02/08/2021 0222   LEUKOCYTESUR TRACE (A) 02/08/2021 0222   Sepsis Labs Invalid input(s): PROCALCITONIN,  WBC,  LACTICIDVEN Microbiology Recent Results (from the past 240 hour(s))  Resp Panel by RT-PCR (Flu A&B, Covid) Nasopharyngeal Swab     Status: None   Collection Time: 02/07/21 11:45 PM   Specimen: Nasopharyngeal Swab; Nasopharyngeal(NP) swabs in vial transport medium  Result Value Ref Range Status   SARS Coronavirus 2 by RT PCR NEGATIVE NEGATIVE Final    Comment: (NOTE) SARS-CoV-2 target nucleic acids are NOT DETECTED.  The SARS-CoV-2 RNA is generally detectable in upper respiratory specimens during the acute phase of infection. The lowest concentration of SARS-CoV-2 viral copies this assay can detect is 138 copies/mL. A negative result does not preclude SARS-Cov-2 infection and should not be used as the sole basis for treatment or other patient management decisions. A negative result may occur with  improper specimen collection/handling, submission of specimen other than nasopharyngeal swab, presence of viral mutation(s) within the areas targeted by this assay, and inadequate number of viral copies(<138 copies/mL). A negative result must be combined  with clinical observations, patient history, and epidemiological information. The expected result is Negative.  Fact Sheet for Patients:  BloggerCourse.com  Fact Sheet for Healthcare Providers:  SeriousBroker.it  This test is no t yet approved or cleared by the [place] FDA and  has been authorized for detection and/or diagnosis of SARS-CoV-2 by FDA under an Emergency Use Authorization (EUA). This EUA will remain  in effect (meaning this test can be used) for the duration of the COVID-19 declaration under Section 564(b)(1) of the Act, 21 U.S.C.section 360bbb-3(b)(1), unless the authorization is terminated  or revoked sooner.       Influenza A by PCR NEGATIVE NEGATIVE Final   Influenza B by PCR NEGATIVE NEGATIVE Final    Comment: (NOTE) The Xpert Xpress SARS-CoV-2/FLU/RSV plus assay is intended as an aid in the diagnosis of influenza from Nasopharyngeal swab specimens and should not be used as a sole basis for treatment. Nasal washings and aspirates are unacceptable for Xpert Xpress SARS-CoV-2/FLU/RSV testing.  Fact Sheet for Patients: BloggerCourse.com  Fact Sheet for Healthcare Providers: SeriousBroker.it  This test is not yet approved or cleared by the [place] FDA and has been authorized for detection and/or diagnosis of SARS-CoV-2 by FDA under an Emergency Use Authorization (EUA). This EUA will remain in effect (meaning this test can be used) for the duration of the COVID-19 declaration under Section 564(b)(1) of the Act, 21 U.S.C. section 360bbb-3(b)(1), unless the authorization is terminated or revoked.  Performed at [facility], [address] [place], [place] [address]    Time coordinating discharge: 35 minutes  SIGNED:  Merlene Laughter, DO [facility] 02/12/2021, 9:48 AM Pager is on AMION  If 7PM-7AM, please contact  night-coverage [email or web address]

## 2021-02-12 NOTE — Progress Notes (Signed)
[facility] Progress Note  Jared Duran 70 y.o. Oct 01, 1950  CC: Pancreatitis   Subjective: Patient seen and examined at bedside.  He is feeling better.  Denies abdominal pain, nausea or vomiting.  Tolerating diet  ROS : Afebrile, negative for chest pain   Objective: Vital signs in last 24 hours: Vitals:   02/11/21 2021 02/12/21 0452  BP: 128/87 (!) 140/93  Pulse: 70 72  Resp: 18 18  Temp: 97.9 F (36.6 C) 98 F (36.7 C)  SpO2: 100% 99%    Physical Exam:  General:  Alert, cooperative, no distress, appears stated age  Head:  Normocephalic, without obvious abnormality, atraumatic  Eyes:  , EOM's intact,   Lungs:    No visible respiratory distress  Heart:  Regular rate and rhythm, S1, S2 normal  Abdomen:   Soft, non-tender, nondistended, bowel sounds present  Extremities: Extremities normal, atraumatic, no  edema  Psych:  Mood and affect normal    Lab Results: Recent Labs    02/11/21 0519 02/12/21 0514  NA 133* 136  K 2.9* 3.9  CL 103 108  CO2 24 22  GLUCOSE 117* 118*  BUN 8 12  CREATININE 0.79 1.08  CALCIUM 8.3* 8.1*  MG 1.6* 2.2  PHOS 2.7 3.5   Recent Labs    02/11/21 0519 02/12/21 0514  AST 144* 53*  ALT 139* 94*  ALKPHOS 138* 122  BILITOT 6.4* 4.0*  PROT 5.5* 5.7*  ALBUMIN 2.4* 2.4*   Recent Labs    02/11/21 0519 02/12/21 0514  WBC 9.2 7.0  NEUTROABS 6.8 4.6  HGB 10.9* 9.8*  HCT 30.2* 28.8*  MCV 102.0* 107.5*  PLT 152 159   Recent Labs    02/09/21 1455 02/10/21 0557  LABPROT 12.8 12.9  INR 1.0 1.0      Assessment/Plan: -Acute pancreatitis.  MRI MRCP with and without contrast yesterday showed pancreatitis as well as around 3.9 cm area of fullness in the pancreatic head causing abrupt obstruction of the common bile duct.  Patient is also complaining of 20 pound weight loss in last 2 months. -Abnormal LFTs with jaundice.  Secondary to above.  Improving.  Recommendation -------------------------- -Patient's LFTs  are improving.  Elevated CA 19-9 at 66 -Discussed with Dr. Dulce Sellar .  Given evidence of recent pancreatitis, he has recommended outpatient EUS in a few weeks followed by ERCP depending on EUS findings -No further inpatient GI work-up planned.  GI will sign off.  Call us back if needed.  Kathi Der MD, FACP 02/12/2021, 8:46 AM  Contact #  [phone]

## 2021-02-28 ENCOUNTER — Other Ambulatory Visit: Payer: Self-pay | Admitting: Gastroenterology

## 2021-04-10 ENCOUNTER — Encounter (HOSPITAL_COMMUNITY): Payer: Self-pay | Admitting: Gastroenterology

## 2021-04-10 ENCOUNTER — Ambulatory Visit (HOSPITAL_COMMUNITY)
Admission: RE | Admit: 2021-04-10 | Discharge: 2021-04-10 | Disposition: A | Discharge status: Home / Self Care | Payer: Medicare Other | Attending: Gastroenterology | Admitting: Gastroenterology

## 2021-04-10 ENCOUNTER — Other Ambulatory Visit: Payer: Self-pay

## 2021-04-10 ENCOUNTER — Ambulatory Visit (HOSPITAL_COMMUNITY): Payer: Medicare Other | Admitting: Certified Registered Nurse Anesthetist

## 2021-04-10 ENCOUNTER — Encounter (HOSPITAL_COMMUNITY)
Admission: RE | Disposition: A | Discharge status: Home / Self Care | Payer: Self-pay | Source: Home / Self Care | Attending: Gastroenterology

## 2021-04-10 DIAGNOSIS — K8689 Other specified diseases of pancreas: Secondary | ICD-10-CM | POA: Insufficient documentation

## 2021-04-10 DIAGNOSIS — Z79899 Other long term (current) drug therapy: Secondary | ICD-10-CM | POA: Diagnosis not present

## 2021-04-10 DIAGNOSIS — F17210 Nicotine dependence, cigarettes, uncomplicated: Secondary | ICD-10-CM | POA: Insufficient documentation

## 2021-04-10 DIAGNOSIS — R933 Abnormal findings on diagnostic imaging of other parts of digestive tract: Secondary | ICD-10-CM | POA: Diagnosis present

## 2021-04-10 HISTORY — PX: EUS: SHX5427

## 2021-04-10 HISTORY — PX: ESOPHAGOGASTRODUODENOSCOPY (EGD) WITH PROPOFOL: SHX5813

## 2021-04-10 SURGERY — UPPER ENDOSCOPIC ULTRASOUND (EUS) LINEAR
Anesthesia: Monitor Anesthesia Care

## 2021-04-10 MED ORDER — LACTATED RINGERS IV SOLN
INTRAVENOUS | Status: DC
  Administered 2021-04-10: 1000 mL via INTRAVENOUS

## 2021-04-10 MED ORDER — ONDANSETRON HCL 4 MG/2ML IJ SOLN
INTRAMUSCULAR | Status: DC | PRN
  Administered 2021-04-10: 4 mg via INTRAVENOUS

## 2021-04-10 MED ORDER — PROPOFOL 500 MG/50ML IV EMUL
INTRAVENOUS | Status: AC
  Filled 2021-04-10: qty 50

## 2021-04-10 MED ORDER — PROPOFOL 500 MG/50ML IV EMUL
INTRAVENOUS | Status: DC | PRN
  Administered 2021-04-10: 125 ug/kg/min via INTRAVENOUS

## 2021-04-10 MED ORDER — PROPOFOL 10 MG/ML IV BOLUS
INTRAVENOUS | Status: DC | PRN
  Administered 2021-04-10: 20 mg via INTRAVENOUS

## 2021-04-10 MED ORDER — SODIUM CHLORIDE 0.9 % IV SOLN: INTRAVENOUS | Status: DC

## 2021-04-10 MED ORDER — LIDOCAINE 2% (20 MG/ML) 5 ML SYRINGE
INTRAMUSCULAR | Status: DC | PRN
  Administered 2021-04-10: 40 mg via INTRAVENOUS

## 2021-04-10 NOTE — Transfer of Care (Signed)
Immediate Anesthesia Transfer of Care Note  Patient: Jared Duran  Procedure(s) Performed: UPPER ENDOSCOPIC ULTRASOUND (EUS) LINEAR  Patient Location: PACU and Endoscopy Unit  Anesthesia Type:MAC  Level of Consciousness: awake, alert  and oriented  Airway & Oxygen Therapy: Patient Spontanous Breathing and Patient connected to face mask oxygen  Post-op Assessment: Report given to RN and Post -op Vital signs reviewed and stable  Post vital signs: Reviewed and stable  Last Vitals:  Vitals Value Taken Time  BP 153/101 04/10/21 1050  Temp    Pulse 91 04/10/21 1052  Resp 21 04/10/21 1052  SpO2 100 % 04/10/21 1052  Vitals shown include unvalidated device data.  Last Pain:  Vitals:   04/10/21 0940  TempSrc: Oral  PainSc: 0-No pain         Complications: No notable events documented.

## 2021-04-10 NOTE — Anesthesia Preprocedure Evaluation (Addendum)
Anesthesia Evaluation  Patient identified by MRN, date of birth, ID band Patient awake    Reviewed: Allergy & Precautions, H&P , NPO status , Patient's Chart, lab work & pertinent test results, reviewed documented beta blocker date and time   Airway Mallampati: I  TM Distance: >3 FB Neck ROM: full    Dental no notable dental hx. (+) Edentulous Upper, Edentulous Lower   Pulmonary neg pulmonary ROS, Current Smoker,    Pulmonary exam normal breath sounds clear to auscultation       Cardiovascular Exercise Tolerance: Good hypertension, Pt. on medications negative cardio ROS   Rhythm:regular Rate:Normal     Neuro/Psych negative neurological ROS  negative psych ROS   GI/Hepatic negative GI ROS, (+)     substance abuse  alcohol use,   Endo/Other  negative endocrine ROS  Renal/GU negative Renal ROS  negative genitourinary   Musculoskeletal   Abdominal   Peds  Hematology negative hematology ROS (+)   Anesthesia Other Findings   Reproductive/Obstetrics negative OB ROS                            Anesthesia Physical Anesthesia Plan  ASA: 3  Anesthesia Plan: MAC   Post-op Pain Management:    Induction: Intravenous  PONV Risk Score and Plan: 1 and Propofol infusion  Airway Management Planned: Nasal Cannula, Natural Airway and Simple Face Mask  Additional Equipment: None  Intra-op Plan:   Post-operative Plan:   Informed Consent: I have reviewed the patients History and Physical, chart, labs and discussed the procedure including the risks, benefits and alternatives for the proposed anesthesia with the patient or authorized representative who has indicated his/her understanding and acceptance.     Dental Advisory Given  Plan Discussed with: CRNA and Anesthesiologist  Anesthesia Plan Comments:         Anesthesia Quick Evaluation

## 2021-04-10 NOTE — Op Note (Signed)
[facility] Patient Name: Jared Duran Procedure Date: 04/10/2021 MRN: [id number] Attending MD: Willis Modena , MD Date of Birth: 1950-12-09 CSN: [id number] Age: 70 Admit Type: Outpatient Procedure:                Upper EUS Indications:              Suspected mass in pancreas on MRI Providers:                Willis Modena, MD, Fayrene Fearing, RN, Beryle Beams, Technician, Maricela Curet, CRNA Referring MD:              Medicines:                Monitored Anesthesia Care Complications:            No immediate complications. Estimated Blood Loss:     Estimated blood loss: none. Estimated blood loss:                            none. Procedure:                Pre-Anesthesia Assessment:                           - Prior to the procedure, a History and Physical                            was performed, and patient medications and                            allergies were reviewed. The patient's tolerance of                            previous anesthesia was also reviewed. The risks                            and benefits of the procedure and the sedation                            options and risks were discussed with the patient.                            All questions were answered, and informed consent                            was obtained. Prior Anticoagulants: The patient has                            taken no previous anticoagulant or antiplatelet                            agents. ASA Grade Assessment: III - A patient with  severe systemic disease. After reviewing the risks                            and benefits, the patient was deemed in                            satisfactory condition to undergo the procedure.                           After obtaining informed consent, the endoscope was                            passed under direct vision. Throughout the                            procedure, the  patient's blood pressure, pulse, and                            oxygen saturations were monitored continuously. The                            [id number] ([id number] Olympus linear ultrasound scope                            was introduced through the mouth, and advanced to                            the second part of duodenum. The upper EUS was                            accomplished without difficulty. The patient                            tolerated the procedure well. Scope In: Scope Out: Findings:      ENDOSONOGRAPHIC FINDING: :      There was no sign of significant endosonographic abnormality in the       ampulla.      There was no sign of significant endosonographic abnormality in the       common bile duct. The maximum diameter of the duct was 6 mm. There was       some symmetrical wall thickening of bile duct, suspect inflammatory.      There was no sign of significant endosonographic abnormality in the left       lobe of the liver.      No lymphadenopathy seen.      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 The parenchyma had hyperechoic strands, hyperechoic foci and       lobularity. The pancreatic duct had visible side-branches. The       pancreatic duct measured up to 3 mm in diameter. Multiple inspections of       head/uncinate pancreas showed no obvious pancreatic mass, though to       exclude a small lesion in face of profound chronic pancreatitis changes  is difficult.      Ongoing peripancreatic edema, most suggestive of resolving pancreatitis. Impression:               - There was no sign of significant pathology in the                            ampulla.                           - There was no sign of significant pathology in the                            common bile duct.                           - There was no evidence of significant pathology in                             the left lobe of the liver.                           - Endosonographic imaging of the pancreas showed                            sonographic changes suggestive of moderate-severe                            chronic pancreatitis. No pancreatic mass seem, with                            caveat limitations as above. However, in light of                            patient previously having dilated CBD (which has                            now resolved) and jaundice/elevated LFTs (which has                            now resolved), these findings are most supportive                            of resolving inflammatory process rather than                            pancreatic mass. Moderate Sedation:      None Recommendation:           - Discharge patient to home (via wheelchair).                           - Resume previous diet today.                           - Continue present medications.                           -  Return to GI clinic in 3 months.                           - Alcohol and tobacco cessation will help decrease                            ongoing/evolving damage to pancreas.                           - Return to referring physician as previously                            scheduled. Procedure Code(s):        --- Professional ---                           [phone], Esophagogastroduodenoscopy, flexible,                            transoral; with endoscopic ultrasound examination,                            including the esophagus, stomach, and either the                            duodenum or a surgically altered stomach where the                            jejunum is examined distal to the anastomosis Diagnosis Code(s):        --- Professional ---                           R93.3, Abnormal findings on diagnostic imaging of                            other parts of digestive tract CPT copyright 2019 American [employer]. All rights reserved. The  codes documented in this report are preliminary and upon coder review may  be revised to meet current compliance requirements. Willis Modena, MD 04/10/2021 11:04:03 AM This report has been signed electronically. Number of Addenda: 0

## 2021-04-10 NOTE — H&P (Signed)
[facility] H/P Note  Chief Complaint: abnormal CT/MRI abdomen  HPI: Jared Duran is an 70 y.o. male.  Admission couple months ago for pancreatitis and jaundice.  Had elevated LFTs which resolved.  Had fullness head of pancreas seen.  Patient denies abdominal pain, weight loss.  Past Medical History:  Diagnosis Date   Hypertension    Pancreatitis     History reviewed. No pertinent surgical history.  Medications Prior to Admission  Medication Sig Dispense Refill   losartan (COZAAR) 100 MG tablet Take 100 mg by mouth daily.     NIFEdipine (PROCARDIA XL/NIFEDICAL XL) 60 MG 24 hr tablet Take 60 mg by mouth daily.     pantoprazole (PROTONIX) 40 MG tablet Take 40 mg by mouth daily.     polyethylene glycol (MIRALAX / GLYCOLAX) 17 g packet Take 17 g by mouth daily as needed for mild constipation. 14 each 0   bisacodyl (DULCOLAX) 10 MG suppository Place 1 suppository (10 mg total) rectally daily as needed for moderate constipation. (Patient not taking: No sig reported) 12 suppository 0   folic acid (FOLVITE) 1 MG tablet Take 1 tablet (1 mg total) by mouth daily. (Patient not taking: Reported on 04/08/2021) 30 tablet 0   Multiple Vitamin (MULTIVITAMIN WITH MINERALS) TABS tablet Take 1 tablet by mouth daily. (Patient not taking: No sig reported) 30 tablet 0   ondansetron (ZOFRAN) 4 MG tablet Take 1 tablet (4 mg total) by mouth every 6 (six) hours as needed for nausea. (Patient not taking: No sig reported) 20 tablet 0   oxyCODONE (OXY IR/ROXICODONE) 5 MG immediate release tablet Take 1 tablet (5 mg total) by mouth every 6 (six) hours as needed for moderate pain. (Patient not taking: No sig reported) 10 tablet 0   thiamine 100 MG tablet Take 1 tablet (100 mg total) by mouth daily. (Patient not taking: No sig reported) 30 tablet 0    Allergies: No Known Allergies  History reviewed. No pertinent family history.  Social History:  reports that he has been smoking cigarettes. He has been  smoking an average of 1 pack per day. He has never used smokeless tobacco. He reports current alcohol use. He reports that he does not use drugs.   ROS: As per HPI, all others negative   Blood pressure (!) 150/101, pulse 76, temperature 98.1 F (36.7 C), temperature source Oral, resp. rate (!) 25, height 5\' 9"  (1.753 m), weight 65.8 kg, SpO2 94 %. General appearance: NAD, thin HEENT:  Anicteric NECK:  Supple CV:  regular ABD:  Soft, non-tender  No results found for this or any previous visit (from the past 48 hour(s)). No results found.  Assessment/Plan   Pancreatitis, resolved.  Jaundice, resolved. Abnormal CT/MRI abdomen, fullness head of pancreas. Risks (bleeding, infection, bowel perforation that could require surgery, sedation-related changes in cardiopulmonary systems), benefits (identification and possible treatment of source of symptoms, exclusion of certain causes of symptoms), and alternatives (watchful waiting, radiographic imaging studies, empiric medical treatment) of upper endoscopy with ultrasound and possible fine needle aspiration (EUS +/- FNA) were explained to patient/family in detail and patient wishes to proceed.   04/10/2021, 10:17 AM

## 2021-04-10 NOTE — Discharge Instructions (Signed)

## 2021-04-10 NOTE — Anesthesia Postprocedure Evaluation (Signed)
Anesthesia Post Note  Patient: Yukio Bisping  Procedure(s) Performed: UPPER ENDOSCOPIC ULTRASOUND (EUS) LINEAR     Patient location during evaluation: PACU Anesthesia Type: MAC Level of consciousness: awake and alert Pain management: pain level controlled Vital Signs Assessment: post-procedure vital signs reviewed and stable Respiratory status: spontaneous breathing, nonlabored ventilation, respiratory function stable and patient connected to nasal cannula oxygen Cardiovascular status: stable and blood pressure returned to baseline Postop Assessment: no apparent nausea or vomiting Anesthetic complications: no   No notable events documented.  Last Vitals:  Vitals:   04/10/21 1100 04/10/21 1110  BP: (!) 142/75 (!) 151/93  Pulse: 87 84  Resp: 20 (!) 22  Temp:    SpO2: 93% 92%    Last Pain:  Vitals:   04/10/21 1110  TempSrc:   PainSc: 0-No pain                 Caileb Rhue

## 2021-04-11 ENCOUNTER — Encounter (HOSPITAL_COMMUNITY): Payer: Self-pay | Admitting: Gastroenterology

## 2021-05-22 ENCOUNTER — Encounter (HOSPITAL_BASED_OUTPATIENT_CLINIC_OR_DEPARTMENT_OTHER): Payer: Self-pay | Admitting: Emergency Medicine

## 2021-05-22 ENCOUNTER — Other Ambulatory Visit: Payer: Self-pay

## 2021-05-22 ENCOUNTER — Inpatient Hospital Stay (HOSPITAL_BASED_OUTPATIENT_CLINIC_OR_DEPARTMENT_OTHER)
Admission: EM | Admit: 2021-05-22 | Discharge: 2021-05-25 | DRG: 439 | Disposition: A | Discharge status: Home / Self Care | Payer: Medicare Other | Attending: Hospitalist | Admitting: Hospitalist

## 2021-05-22 ENCOUNTER — Emergency Department (HOSPITAL_BASED_OUTPATIENT_CLINIC_OR_DEPARTMENT_OTHER): Payer: Medicare Other

## 2021-05-22 DIAGNOSIS — F1010 Alcohol abuse, uncomplicated: Secondary | ICD-10-CM | POA: Diagnosis present

## 2021-05-22 DIAGNOSIS — K863 Pseudocyst of pancreas: Secondary | ICD-10-CM | POA: Diagnosis present

## 2021-05-22 DIAGNOSIS — E872 Acidosis: Secondary | ICD-10-CM | POA: Diagnosis present

## 2021-05-22 DIAGNOSIS — K852 Alcohol induced acute pancreatitis without necrosis or infection: Principal | ICD-10-CM | POA: Diagnosis present

## 2021-05-22 DIAGNOSIS — E876 Hypokalemia: Secondary | ICD-10-CM | POA: Diagnosis present

## 2021-05-22 DIAGNOSIS — Z79899 Other long term (current) drug therapy: Secondary | ICD-10-CM

## 2021-05-22 DIAGNOSIS — K859 Acute pancreatitis without necrosis or infection, unspecified: Secondary | ICD-10-CM | POA: Diagnosis not present

## 2021-05-22 DIAGNOSIS — R739 Hyperglycemia, unspecified: Secondary | ICD-10-CM | POA: Diagnosis present

## 2021-05-22 DIAGNOSIS — Z20822 Contact with and (suspected) exposure to covid-19: Secondary | ICD-10-CM | POA: Diagnosis present

## 2021-05-22 DIAGNOSIS — F17210 Nicotine dependence, cigarettes, uncomplicated: Secondary | ICD-10-CM | POA: Diagnosis present

## 2021-05-22 DIAGNOSIS — K219 Gastro-esophageal reflux disease without esophagitis: Secondary | ICD-10-CM | POA: Diagnosis present

## 2021-05-22 DIAGNOSIS — I10 Essential (primary) hypertension: Secondary | ICD-10-CM | POA: Diagnosis present

## 2021-05-22 DIAGNOSIS — Z66 Do not resuscitate: Secondary | ICD-10-CM | POA: Diagnosis present

## 2021-05-22 DIAGNOSIS — K860 Alcohol-induced chronic pancreatitis: Secondary | ICD-10-CM | POA: Diagnosis present

## 2021-05-22 LAB — COMPREHENSIVE METABOLIC PANEL WITH GFR
ALT: 43 U/L (ref 0–44)
AST: 91 U/L — ABNORMAL HIGH (ref 15–41)
Albumin: 4.1 g/dL (ref 3.5–5.0)
Alkaline Phosphatase: 57 U/L (ref 38–126)
Anion gap: 17 — ABNORMAL HIGH (ref 5–15)
BUN: 16 mg/dL (ref 8–23)
CO2: 21 mmol/L — ABNORMAL LOW (ref 22–32)
Calcium: 9.3 mg/dL (ref 8.9–10.3)
Chloride: 94 mmol/L — ABNORMAL LOW (ref 98–111)
Creatinine, Ser: 1.02 mg/dL (ref 0.61–1.24)
GFR, Estimated: >60 mL/min
Glucose, Bld: 140 mg/dL — ABNORMAL HIGH (ref 70–99)
Potassium: 3.5 mmol/L (ref 3.5–5.1)
Sodium: 132 mmol/L — ABNORMAL LOW (ref 135–145)
Total Bilirubin: 4.3 mg/dL — ABNORMAL HIGH (ref 0.3–1.2)
Total Protein: 8.2 g/dL — ABNORMAL HIGH (ref 6.5–8.1)

## 2021-05-22 LAB — LIPASE, BLOOD: Lipase: 635 U/L — ABNORMAL HIGH (ref 11–51)

## 2021-05-22 LAB — CBC
HCT: 48 % (ref 39.0–52.0)
Hemoglobin: 17.2 g/dL — ABNORMAL HIGH (ref 13.0–17.0)
MCH: 35.8 pg — ABNORMAL HIGH (ref 26.0–34.0)
MCHC: 35.8 g/dL (ref 30.0–36.0)
MCV: 99.8 fL (ref 80.0–100.0)
Platelets: 149 10*3/uL — ABNORMAL LOW (ref 150–400)
RBC: 4.81 MIL/uL (ref 4.22–5.81)
RDW: 14.9 % (ref 11.5–15.5)
WBC: 9.6 10*3/uL (ref 4.0–10.5)
nRBC: 0 % (ref 0.0–0.2)

## 2021-05-22 LAB — RESP PANEL BY RT-PCR (FLU A&B, COVID) ARPGX2
Influenza A by PCR: NEGATIVE
Influenza B by PCR: NEGATIVE
SARS Coronavirus 2 by RT PCR: NEGATIVE

## 2021-05-22 MED ORDER — HYDROMORPHONE HCL 1 MG/ML IJ SOLN
1.0000 mg | Freq: Once | INTRAMUSCULAR | Status: AC
  Administered 2021-05-22: 1 mg via INTRAVENOUS
  Filled 2021-05-22: qty 1

## 2021-05-22 MED ORDER — ONDANSETRON HCL 4 MG/2ML IJ SOLN
4.0000 mg | Freq: Once | INTRAMUSCULAR | Status: AC
  Administered 2021-05-22: 4 mg via INTRAVENOUS
  Filled 2021-05-22: qty 2

## 2021-05-22 MED ORDER — IOHEXOL 350 MG/ML SOLN
85.0000 mL | Freq: Once | INTRAVENOUS | Status: AC | PRN
  Administered 2021-05-22: 85 mL via INTRAVENOUS

## 2021-05-22 MED ORDER — LOSARTAN POTASSIUM 50 MG PO TABS
100.0000 mg | ORAL_TABLET | Freq: Every day | ORAL | Status: DC
  Administered 2021-05-22 – 2021-05-25 (×4): 100 mg via ORAL
  Filled 2021-05-22 (×3): qty 2
  Filled 2021-05-22: qty 4

## 2021-05-22 MED ORDER — LACTATED RINGERS IV SOLN: INTRAVENOUS | Status: DC

## 2021-05-22 MED ORDER — NIFEDIPINE ER OSMOTIC RELEASE 60 MG PO TB24
60.0000 mg | ORAL_TABLET | Freq: Every day | ORAL | Status: DC
  Administered 2021-05-22 – 2021-05-25 (×4): 60 mg via ORAL
  Filled 2021-05-22 (×3): qty 1
  Filled 2021-05-22: qty 2

## 2021-05-22 MED ORDER — LACTATED RINGERS IV BOLUS
1000.0000 mL | Freq: Once | INTRAVENOUS | Status: AC
  Administered 2021-05-22: 1000 mL via INTRAVENOUS

## 2021-05-22 MED ORDER — PANTOPRAZOLE SODIUM 40 MG IV SOLR
40.0000 mg | Freq: Once | INTRAVENOUS | Status: AC
  Administered 2021-05-22: 40 mg via INTRAVENOUS
  Filled 2021-05-22: qty 40

## 2021-05-22 NOTE — ED Notes (Signed)
Patient transported to CT 

## 2021-05-22 NOTE — ED Provider Notes (Signed)
[facility] EMERGENCY DEPARTMENT Provider Note   CSN: [id number] Arrival date & time: 05/22/21  1854     History Chief Complaint  Patient presents with   Abdominal Pain    Jared Duran is a 70 y.o. male.   Abdominal Pain Associated symptoms: fatigue, nausea and vomiting   Associated symptoms: no chest pain, no chills, no cough, no dysuria, no fever, no hematuria, no shortness of breath and no sore throat   Patient presents for abdominal pain, nausea, and vomiting for the past 3 days.  He has history of pancreatitis that has been attributed to alcohol use.  He was last hospitalized for this in June.  He reports that since his most recent hospitalization, he has had resolution of his symptoms over the past several months.  Recently, he suffered the deaths of multiple family members.  Because of this, he has increased his drinking.  This did precede his current symptoms.  Patient endorses generalized abdominal pain.  He has not been able to keep anything down for the past 3 days, including his blood pressure medications.  He denies any blood in his emesis.    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/08/2021   Fatty liver, alcoholic 04/12/2020   Benign essential HTN 04/12/2020   Alcohol abuse 04/11/2020   Acute pancreatitis 04/10/2020   Acute alcoholic pancreatitis 04/10/2020    Past Surgical History:  Procedure Laterality Date   ESOPHAGOGASTRODUODENOSCOPY (EGD) WITH PROPOFOL N/A 04/10/2021   Procedure: ESOPHAGOGASTRODUODENOSCOPY (EGD) WITH PROPOFOL;  Surgeon: Willis Modena, MD;  Location: [facility];  Service: Endoscopy;  Laterality: N/A;   EUS N/A 04/10/2021   Procedure: UPPER ENDOSCOPIC ULTRASOUND (EUS) LINEAR;  Surgeon: Willis Modena, MD;  Location: [facility];  Service: Endoscopy;  Laterality: N/A;       History reviewed. No pertinent  family history.  Social History   Tobacco Use   Smoking status: Every Day    Packs/day: 1.00    Types: Cigarettes   Smokeless tobacco: Never  Substance Use Topics   Alcohol use: Yes   Drug use: No    Home Medications Prior to Admission medications   Medication Sig Start Date End Date Taking? Authorizing Provider  losartan (COZAAR) 100 MG tablet Take 100 mg by mouth daily. 02/03/20  Yes [provider]  NIFEdipine (PROCARDIA XL/NIFEDICAL XL) 60 MG 24 hr tablet Take 60 mg by mouth daily. 11/12/20  Yes [provider]  pantoprazole (PROTONIX) 40 MG tablet Take 40 mg by mouth daily. 11/12/20  Yes [provider]  polyethylene glycol (MIRALAX / GLYCOLAX) 17 g packet Take 17 g by mouth daily as needed for mild constipation. 02/12/21  Yes Marguerita Merles Latif, DO    Allergies    Patient has no known allergies.  Review of Systems   Review of Systems  Constitutional:  Positive for appetite change and fatigue. Negative for chills and fever.  HENT:  Negative for ear pain and sore throat.   Eyes:  Negative for pain and visual disturbance.  Respiratory:  Negative for cough and shortness of breath.   Cardiovascular:  Negative for chest pain and palpitations.  Gastrointestinal:  Positive for abdominal pain, nausea and vomiting. Negative for abdominal distention.  Genitourinary:  Negative for dysuria and hematuria.  Musculoskeletal:  Negative for arthralgias, back pain, myalgias and neck pain.  Skin:  Negative for  color change and rash.  Neurological:  Negative for dizziness, seizures, syncope, weakness, light-headedness, numbness and headaches.  Psychiatric/Behavioral:  Negative for confusion and decreased concentration.   All other systems reviewed and are negative.  Physical Exam Updated Vital Signs BP (!) 149/99 (BP Location: Left Arm)   Pulse 68   Temp 98.7 F (37.1 C) (Oral)   Resp 17   Ht 5\' 9"  (1.753 m)   Wt 63.5 kg   SpO2 91%   BMI 20.67 kg/m    Physical Exam Vitals and nursing note reviewed.  Constitutional:      General: He is not in acute distress.    Appearance: He is well-developed. He is ill-appearing. He is not toxic-appearing or diaphoretic.  HENT:     Head: Normocephalic and atraumatic.     Mouth/Throat:     Mouth: Mucous membranes are moist.     Pharynx: Oropharynx is clear.  Eyes:     Conjunctiva/sclera: Conjunctivae normal.  Cardiovascular:     Rate and Rhythm: Regular rhythm. Tachycardia present.     Heart sounds: No murmur heard. Pulmonary:     Effort: Pulmonary effort is normal. No respiratory distress.     Breath sounds: Normal breath sounds. No wheezing or rales.  Chest:     Chest wall: No tenderness.  Abdominal:     Palpations: Abdomen is soft.     Tenderness: There is generalized abdominal tenderness. There is no guarding or rebound.  Musculoskeletal:     Cervical back: Neck supple.  Skin:    General: Skin is warm and dry.     Capillary Refill: Capillary refill takes less than 2 seconds.  Neurological:     General: No focal deficit present.     Mental Status: He is alert and oriented to person, place, and time.     Cranial Nerves: No cranial nerve deficit.     Motor: No weakness.  Psychiatric:        Mood and Affect: Mood normal.        Behavior: Behavior normal.    ED Results / Procedures / Treatments   Labs (all labs ordered are listed, but only abnormal results are displayed) Labs Reviewed  LIPASE, BLOOD - Abnormal; Notable for the following components:      Result Value   Lipase 635 (*)    All other components within normal limits  COMPREHENSIVE METABOLIC PANEL - Abnormal; Notable for the following components:   Sodium 132 (*)    Chloride 94 (*)    CO2 21 (*)    Glucose, Bld 140 (*)    Total Protein 8.2 (*)    AST 91 (*)    Total Bilirubin 4.3 (*)    Anion gap 17 (*)    All other components within normal limits  CBC - Abnormal; Notable for the following components:    Hemoglobin 17.2 (*)    MCH 35.8 (*)    Platelets 149 (*)    All other components within normal limits  URINALYSIS, ROUTINE W REFLEX MICROSCOPIC - Abnormal; Notable for the following components:   Color, Urine AMBER (*)    Hgb urine dipstick SMALL (*)    Bilirubin Urine LARGE (*)    Ketones, ur 80 (*)    All other components within normal limits  URINALYSIS, MICROSCOPIC (REFLEX) - Abnormal; Notable for the following components:   Bacteria, UA RARE (*)    All other components within normal limits  RESP PANEL BY RT-PCR (FLU A&B, COVID) ARPGX2  EKG None  Radiology CT ABDOMEN PELVIS W CONTRAST  Result Date: 05/22/2021 CLINICAL DATA:  Nausea/vomiting Abdominal pain, acute, nonlocalized EXAM: CT ABDOMEN AND PELVIS WITH CONTRAST TECHNIQUE: Multidetector CT imaging of the abdomen and pelvis was performed using the standard protocol following bolus administration of intravenous contrast. CONTRAST:  57mL OMNIPAQUE IOHEXOL 350 MG/ML SOLN COMPARISON:  MRI abdomen 02/10/2021, CT abdomen pelvis 02/07/2021 FINDINGS: Lower chest: No acute abnormality. Hepatobiliary: No focal liver abnormality. No gallstones, gallbladder wall thickening, or pericholecystic fluid. Persistent state intra and extrahepatic biliary ductal dilatation. common bile duct measures up to at least 1 cm. Intrahepatic biliary ducts measure at least up to 0.5 cm. Pancreas: Hazy pancreatic contour with associated peripancreatic free fluid and fat stranding. Query a developing pseudocyst along the pancreatic tail measuring 2 cm. No definite focal pancreatic lesion within the pancreatic head which still appears slightly more prominent than the rest of the pancreatic parenchyma. Slightly improved prominence of the pancreatic head compared to prior. No main pancreatic duct dilatation. Spleen: Normal in size without focal abnormality. Adrenals/Urinary Tract: No adrenal nodule bilaterally. Bilateral kidneys enhance symmetrically. Fluid density  lesion within the right kidney likely represents a simple renal cyst. Subcentimeter hypodensities are too small to characterize. No hydronephrosis. No hydroureter. The urinary bladder is unremarkable. Stomach/Bowel: Bowel thickening of the greater curvature of the stomach adjacent to the pancreatic tail. Otherwise the stomach is within normal limits. No evidence of bowel wall thickening or dilatation. Vascular/Lymphatic: Limited evaluation for splenic artery pseudoaneurysm on this portal venous phase study. No abdominal aorta or iliac aneurysm. At least moderate atherosclerotic plaque of the aorta and its branches. No abdominal, pelvic, or inguinal lymphadenopathy. Reproductive: Prostate is unremarkable. Other: Trace volume simple free fluid. No intraperitoneal free gas. No organized fluid collection. Musculoskeletal: Tiny fat containing umbilical hernia. No suspicious lytic or blastic osseous lesions. No acute displaced fracture. IMPRESSION: 1. Acute pancreatitis. Query a developing pseudocyst along the pancreatic tail measuring 2 cm. Recommend attention on follow-up. 2. Likely reactive changes of the greater curvature of the stomach. 3. Persistent stable intra and extrahepatic biliary ductal dilatation with no associated main pancreatic duct dilatation. 4.  Aortic Atherosclerosis (ICD10-I70.0). Electronically Signed   By: Tish Frederickson M.D.   On: 05/22/2021 22:14    Procedures Procedures   Medications Ordered in ED Medications  losartan (COZAAR) tablet 100 mg (100 mg Oral Given 05/23/21 1331)  NIFEdipine (PROCARDIA XL/NIFEDICAL XL) 24 hr tablet 60 mg (60 mg Oral Given 05/22/21 2201)  lactated ringers infusion ( Intravenous Infusion Verify 05/23/21 0645)  lactated ringers bolus 1,000 mL (0 mLs Intravenous Stopped 05/22/21 2248)  HYDROmorphone (DILAUDID) injection 1 mg (1 mg Intravenous Given 05/22/21 2135)  ondansetron (ZOFRAN) injection 4 mg (4 mg Intravenous Given 05/22/21 2134)  pantoprazole (PROTONIX)  injection 40 mg (40 mg Intravenous Given 05/22/21 2158)  iohexol (OMNIPAQUE) 350 MG/ML injection 85 mL (85 mLs Intravenous Contrast Given 05/22/21 2142)  HYDROmorphone (DILAUDID) injection 1 mg (1 mg Intravenous Given 05/23/21 0210)  HYDROmorphone (DILAUDID) injection 1 mg (1 mg Intravenous Given 05/23/21 6834)    ED Course  I have reviewed the triage vital signs and the nursing notes.  Pertinent labs & imaging results that were available during my care of the patient were reviewed by me and considered in my medical decision making (see chart for details).    MDM Rules/Calculators/A&P  Patient presents for 3 days of generalized abdominal pain, nausea, vomiting, and p.o. intolerance.  He does report that the symptoms are consistent with his previous episodes of pancreatitis.  On arrival, patient is alert and oriented.  He does appear uncomfortable.  Vital signs are notable for hypertension and tachycardia.  Patient has generalized abdominal tenderness on exam that does not appear to be most prominent in his epigastrium.  Lab work does show elevated lipase.  Because of his tenderness that extends into his lower abdomen, CT scan of abdomen pelvis was ordered.  Given his p.o. intolerance, bolus of IV fluids was ordered.  Dilaudid and Zofran were given for symptomatic relief.  Home blood pressure medications were ordered, which he was able to tolerate.  Dose of Protonix given as well.  CT scan showed findings consistent with pancreatitis.  There is a possible developing pseudocyst.  Patient was agreeable for hospitalization for continued pain control and bowel rest.  He does report that he has funeral obligations on Sunday, 4 days from now.  Patient was admitted for continued management.  Final Clinical Impression(s) / ED Diagnoses Final diagnoses:  Alcohol-induced acute pancreatitis, unspecified complication status    Rx / DC Orders ED Discharge Orders     None         Gloris Manchester, MD 05/23/21 1354

## 2021-05-22 NOTE — ED Triage Notes (Signed)
Reports diffuse abdominal pain with n/v for the last three days.  States I think its my pancreas.

## 2021-05-23 ENCOUNTER — Encounter (HOSPITAL_COMMUNITY): Payer: Self-pay | Admitting: Internal Medicine

## 2021-05-23 DIAGNOSIS — Z66 Do not resuscitate: Secondary | ICD-10-CM | POA: Diagnosis present

## 2021-05-23 DIAGNOSIS — F17210 Nicotine dependence, cigarettes, uncomplicated: Secondary | ICD-10-CM | POA: Diagnosis present

## 2021-05-23 DIAGNOSIS — K860 Alcohol-induced chronic pancreatitis: Secondary | ICD-10-CM | POA: Diagnosis present

## 2021-05-23 DIAGNOSIS — E872 Acidosis: Secondary | ICD-10-CM | POA: Diagnosis present

## 2021-05-23 DIAGNOSIS — K859 Acute pancreatitis without necrosis or infection, unspecified: Secondary | ICD-10-CM | POA: Diagnosis present

## 2021-05-23 DIAGNOSIS — Z79899 Other long term (current) drug therapy: Secondary | ICD-10-CM | POA: Diagnosis not present

## 2021-05-23 DIAGNOSIS — R739 Hyperglycemia, unspecified: Secondary | ICD-10-CM | POA: Diagnosis present

## 2021-05-23 DIAGNOSIS — Z20822 Contact with and (suspected) exposure to covid-19: Secondary | ICD-10-CM | POA: Diagnosis present

## 2021-05-23 DIAGNOSIS — K852 Alcohol induced acute pancreatitis without necrosis or infection: Principal | ICD-10-CM

## 2021-05-23 DIAGNOSIS — K863 Pseudocyst of pancreas: Secondary | ICD-10-CM | POA: Diagnosis present

## 2021-05-23 DIAGNOSIS — I10 Essential (primary) hypertension: Secondary | ICD-10-CM | POA: Diagnosis present

## 2021-05-23 DIAGNOSIS — F1010 Alcohol abuse, uncomplicated: Secondary | ICD-10-CM | POA: Diagnosis present

## 2021-05-23 DIAGNOSIS — E876 Hypokalemia: Secondary | ICD-10-CM | POA: Diagnosis present

## 2021-05-23 DIAGNOSIS — K219 Gastro-esophageal reflux disease without esophagitis: Secondary | ICD-10-CM | POA: Diagnosis present

## 2021-05-23 LAB — CBC WITH DIFFERENTIAL/PLATELET
Abs Immature Granulocytes: 0.05 10*3/uL (ref 0.00–0.07)
Basophils Absolute: 0 10*3/uL (ref 0.0–0.1)
Basophils Relative: 0 %
Eosinophils Absolute: 0 10*3/uL (ref 0.0–0.5)
Eosinophils Relative: 0 %
HCT: 45.3 % (ref 39.0–52.0)
Hemoglobin: 16.1 g/dL (ref 13.0–17.0)
Immature Granulocytes: 1 %
Lymphocytes Relative: 11 %
Lymphs Abs: 0.8 10*3/uL (ref 0.7–4.0)
MCH: 35.5 pg — ABNORMAL HIGH (ref 26.0–34.0)
MCHC: 35.5 g/dL (ref 30.0–36.0)
MCV: 100 fL (ref 80.0–100.0)
Monocytes Absolute: 0.7 10*3/uL (ref 0.1–1.0)
Monocytes Relative: 9 %
Neutro Abs: 5.7 10*3/uL (ref 1.7–7.7)
Neutrophils Relative %: 79 %
Platelets: 124 10*3/uL — ABNORMAL LOW (ref 150–400)
RBC: 4.53 MIL/uL (ref 4.22–5.81)
RDW: 15.1 % (ref 11.5–15.5)
WBC: 7.2 10*3/uL (ref 4.0–10.5)
nRBC: 0 % (ref 0.0–0.2)

## 2021-05-23 LAB — COMPREHENSIVE METABOLIC PANEL
ALT: 38 U/L (ref 0–44)
AST: 63 U/L — ABNORMAL HIGH (ref 15–41)
Albumin: 3.4 g/dL — ABNORMAL LOW (ref 3.5–5.0)
Alkaline Phosphatase: 47 U/L (ref 38–126)
Anion gap: 12 (ref 5–15)
BUN: 12 mg/dL (ref 8–23)
CO2: 26 mmol/L (ref 22–32)
Calcium: 9.2 mg/dL (ref 8.9–10.3)
Chloride: 95 mmol/L — ABNORMAL LOW (ref 98–111)
Creatinine, Ser: 0.79 mg/dL (ref 0.61–1.24)
GFR, Estimated: >60 mL/min (ref >60)
Glucose, Bld: 112 mg/dL — ABNORMAL HIGH (ref 70–99)
Potassium: 3.1 mmol/L — ABNORMAL LOW (ref 3.5–5.1)
Sodium: 133 mmol/L — ABNORMAL LOW (ref 135–145)
Total Bilirubin: 4.8 mg/dL — ABNORMAL HIGH (ref 0.3–1.2)
Total Protein: 7.1 g/dL (ref 6.5–8.1)

## 2021-05-23 LAB — URINALYSIS, ROUTINE W REFLEX MICROSCOPIC
Glucose, UA: NEGATIVE mg/dL
Ketones, ur: 80 mg/dL — AB
Leukocytes,Ua: NEGATIVE
Nitrite: NEGATIVE
Protein, ur: NEGATIVE mg/dL
Specific Gravity, Urine: 1.01 (ref 1.005–1.030)
pH: 7 (ref 5.0–8.0)

## 2021-05-23 LAB — URINALYSIS, MICROSCOPIC (REFLEX)

## 2021-05-23 LAB — LACTIC ACID, PLASMA
Lactic Acid, Venous: 1.1 mmol/L (ref 0.5–1.9)
Lactic Acid, Venous: 1 mmol/L (ref 0.5–1.9)

## 2021-05-23 LAB — MAGNESIUM: Magnesium: 1.5 mg/dL — ABNORMAL LOW (ref 1.7–2.4)

## 2021-05-23 MED ORDER — HYDROMORPHONE HCL 1 MG/ML IJ SOLN
0.5000 mg | INTRAMUSCULAR | Status: DC | PRN
Start: 2021-05-23 — End: 2021-05-24
  Administered 2021-05-23 – 2021-05-24 (×4): 0.5 mg via INTRAVENOUS
  Filled 2021-05-23 (×4): qty 0.5

## 2021-05-23 MED ORDER — OXYCODONE HCL 5 MG PO TABS
5.0000 mg | ORAL_TABLET | ORAL | Status: DC | PRN
  Administered 2021-05-23 – 2021-05-24 (×4): 5 mg via ORAL
  Filled 2021-05-23 (×4): qty 1

## 2021-05-23 MED ORDER — HYDROMORPHONE HCL 1 MG/ML IJ SOLN
1.0000 mg | Freq: Once | INTRAMUSCULAR | Status: AC
Start: 2021-05-23 — End: 2021-05-23
  Administered 2021-05-23: 1 mg via INTRAVENOUS
  Filled 2021-05-23: qty 1

## 2021-05-23 MED ORDER — ONDANSETRON HCL 4 MG/2ML IJ SOLN
4.0000 mg | Freq: Four times a day (QID) | INTRAMUSCULAR | Status: DC | PRN
  Administered 2021-05-23 – 2021-05-24 (×4): 4 mg via INTRAVENOUS
  Filled 2021-05-23 (×4): qty 2

## 2021-05-23 MED ORDER — HYDROMORPHONE HCL 1 MG/ML IJ SOLN
1.0000 mg | Freq: Once | INTRAMUSCULAR | Status: AC
  Administered 2021-05-23: 1 mg via INTRAVENOUS
  Filled 2021-05-23: qty 1

## 2021-05-23 MED ORDER — METOPROLOL TARTRATE 5 MG/5ML IV SOLN: 5.0000 mg | Freq: Four times a day (QID) | INTRAVENOUS | Status: DC | PRN

## 2021-05-23 MED ORDER — ENOXAPARIN SODIUM 40 MG/0.4ML IJ SOSY
40.0000 mg | PREFILLED_SYRINGE | Freq: Every day | INTRAMUSCULAR | Status: DC
  Administered 2021-05-23 – 2021-05-24 (×2): 40 mg via SUBCUTANEOUS
  Filled 2021-05-23 (×2): qty 0.4

## 2021-05-23 NOTE — ED Provider Notes (Signed)
  Provider Note MRN:  [id number]  Arrival date & time: 05/23/21    ED Course and Medical Decision Making  Assumed care from Dr. Durwin Nora at shift change.  Pancreatitis, plan to admit for pain control.  Procedures  Final Clinical Impressions(s) / ED Diagnoses     ICD-10-CM   1. Alcohol-induced acute pancreatitis, unspecified complication status  K85.20       ED Discharge Orders     None       Discharge Instructions   None     Elmer Sow. Pilar Plate, MD [facility] [email or web address]    Sabas Sous, MD 05/23/21 [phone]

## 2021-05-23 NOTE — ED Notes (Signed)
[facility] here to get pt report given

## 2021-05-23 NOTE — H&P (Signed)
History and Physical    Jared Duran BOF:[id number] DOB: 1951/03/17 DOA: 05/22/2021  PCP: Brayton El, PA-C  Patient coming from: Home  Chief Complaint: stomach  HPI: Jared Duran is a 70 y.o. male with medical history significant of EtOH abuse, HTN, GERD. Presenting with 4 days of global abdominal pain. He says it's exactly like his pancreatitis flares. He's had nausea and vomiting. Not able to keep any food down. He hasn't had any diarrhea or fever. He tried some OTC meds for nausea, but they didn't help. He reports the last time he had EtOH was Friday. Since his symptoms did not improve yesterday, he decided to come to the ED for help. He denies any other aggravating or alleviating factors.   ED Course: Lipase was found to be 635. CT showed acute pancreatitis w/ 2 cm pseudocyst of pancreatic tail. He was started on fluids, pain control. [facility] was called for admission.    Review of Systems:  Review of systems is otherwise negative for all not mentioned in HPI.   PMHx Past Medical History:  Diagnosis Date   Hypertension    Pancreatitis     PSHx Past Surgical History:  Procedure Laterality Date   ESOPHAGOGASTRODUODENOSCOPY (EGD) WITH PROPOFOL N/A 04/10/2021   Procedure: ESOPHAGOGASTRODUODENOSCOPY (EGD) WITH PROPOFOL;  Surgeon: Willis Modena, MD;  Location: [facility];  Service: Endoscopy;  Laterality: N/A;   EUS N/A 04/10/2021   Procedure: UPPER ENDOSCOPIC ULTRASOUND (EUS) LINEAR;  Surgeon: Willis Modena, MD;  Location: [facility];  Service: Endoscopy;  Laterality: N/A;    SocHx  reports that he has been smoking cigarettes. He has been smoking an average of 1 pack per day. He has never used smokeless tobacco. He reports current alcohol use. He reports that he does not use drugs.  No Known Allergies  FamHx No family history on file.  Prior to Admission medications   Medication Sig Start Date End Date Taking? Authorizing Provider  losartan (COZAAR) 100 MG  tablet Take 100 mg by mouth daily. 02/03/20   [provider]  NIFEdipine (PROCARDIA XL/NIFEDICAL XL) 60 MG 24 hr tablet Take 60 mg by mouth daily. 11/12/20   [provider]  pantoprazole (PROTONIX) 40 MG tablet Take 40 mg by mouth daily. 11/12/20   [provider]  polyethylene glycol (MIRALAX / GLYCOLAX) 17 g packet Take 17 g by mouth daily as needed for mild constipation. 02/12/21   Merlene Laughter, DO    Physical Exam: Vitals:   05/23/21 0900 05/23/21 1000 05/23/21 1100 05/23/21 1240  BP: (!) 161/96 (!) 137/92 (!) 175/93 (!) 149/99  Pulse: 72  75 68  Resp: 10 18 16 17   Temp:    98.7 F (37.1 C)  TempSrc:    Oral  SpO2: 96%  94% 91%  Weight:      Height:        General: 70 y.o. male resting in bed in NAD Eyes: PERRL, normal sclera ENMT: Nares patent w/o discharge, orophaynx clear, dentition normal, ears w/o discharge/lesions/ulcers Neck: Supple, trachea midline Cardiovascular: RRR, +S1, S2, no m/g/r, equal pulses throughout Respiratory: CTABL, no w/r/r, normal WOB GI: BS+, ND, global abdominal TTP, no masses noted, no organomegaly noted MSK: No e/c/c Skin: No rashes, bruises, ulcerations noted Neuro: A&O x 3, no focal deficits Psyc: Appropriate interaction and affect, calm/cooperative  Labs on Admission: I have personally reviewed following labs and imaging studies  CBC: Recent Labs  Lab 05/22/21 1911  WBC 9.6  HGB 17.2*  HCT  48.0  MCV 99.8  PLT 149*   Basic Metabolic Panel: Recent Labs  Lab 05/22/21 1911  NA 132*  K 3.5  CL 94*  CO2 21*  GLUCOSE 140*  BUN 16  CREATININE 1.02  CALCIUM 9.3   GFR: Estimated Creatinine Clearance: 62.3 mL/min (by C-G formula based on SCr of 1.02 mg/dL). Liver Function Tests: Recent Labs  Lab 05/22/21 1911  AST 91*  ALT 43  ALKPHOS 57  BILITOT 4.3*  PROT 8.2*  ALBUMIN 4.1   Recent Labs  Lab 05/22/21 1911  LIPASE 635*   No results for input(s): AMMONIA in the last 168 hours. Coagulation  Profile: No results for input(s): INR, PROTIME in the last 168 hours. Cardiac Enzymes: No results for input(s): CKTOTAL, CKMB, CKMBINDEX, TROPONINI in the last 168 hours. BNP (last 3 results) No results for input(s): PROBNP in the last 8760 hours. HbA1C: No results for input(s): HGBA1C in the last 72 hours. CBG: No results for input(s): GLUCAP in the last 168 hours. Lipid Profile: No results for input(s): CHOL, HDL, LDLCALC, TRIG, CHOLHDL, LDLDIRECT in the last 72 hours. Thyroid Function Tests: No results for input(s): TSH, T4TOTAL, FREET4, T3FREE, THYROIDAB in the last 72 hours. Anemia Panel: No results for input(s): VITAMINB12, FOLATE, FERRITIN, TIBC, IRON, RETICCTPCT in the last 72 hours. Urine analysis:    Component Value Date/Time   COLORURINE AMBER (A) 05/23/2021 0223   APPEARANCEUR CLEAR 05/23/2021 0223   LABSPEC 1.010 05/23/2021 0223   PHURINE 7.0 05/23/2021 0223   GLUCOSEU NEGATIVE 05/23/2021 0223   HGBUR SMALL (A) 05/23/2021 0223   BILIRUBINUR LARGE (A) 05/23/2021 0223   KETONESUR 80 (A) 05/23/2021 0223   PROTEINUR NEGATIVE 05/23/2021 0223   NITRITE NEGATIVE 05/23/2021 0223   LEUKOCYTESUR NEGATIVE 05/23/2021 0223    Radiological Exams on Admission: CT ABDOMEN PELVIS W CONTRAST  Result Date: 05/22/2021 CLINICAL DATA:  Nausea/vomiting Abdominal pain, acute, nonlocalized EXAM: CT ABDOMEN AND PELVIS WITH CONTRAST TECHNIQUE: Multidetector CT imaging of the abdomen and pelvis was performed using the standard protocol following bolus administration of intravenous contrast. CONTRAST:  70mL OMNIPAQUE IOHEXOL 350 MG/ML SOLN COMPARISON:  MRI abdomen 02/10/2021, CT abdomen pelvis 02/07/2021 FINDINGS: Lower chest: No acute abnormality. Hepatobiliary: No focal liver abnormality. No gallstones, gallbladder wall thickening, or pericholecystic fluid. Persistent state intra and extrahepatic biliary ductal dilatation. common bile duct measures up to at least 1 cm. Intrahepatic biliary ducts  measure at least up to 0.5 cm. Pancreas: Hazy pancreatic contour with associated peripancreatic free fluid and fat stranding. Query a developing pseudocyst along the pancreatic tail measuring 2 cm. No definite focal pancreatic lesion within the pancreatic head which still appears slightly more prominent than the rest of the pancreatic parenchyma. Slightly improved prominence of the pancreatic head compared to prior. No main pancreatic duct dilatation. Spleen: Normal in size without focal abnormality. Adrenals/Urinary Tract: No adrenal nodule bilaterally. Bilateral kidneys enhance symmetrically. Fluid density lesion within the right kidney likely represents a simple renal cyst. Subcentimeter hypodensities are too small to characterize. No hydronephrosis. No hydroureter. The urinary bladder is unremarkable. Stomach/Bowel: Bowel thickening of the greater curvature of the stomach adjacent to the pancreatic tail. Otherwise the stomach is within normal limits. No evidence of bowel wall thickening or dilatation. Vascular/Lymphatic: Limited evaluation for splenic artery pseudoaneurysm on this portal venous phase study. No abdominal aorta or iliac aneurysm. At least moderate atherosclerotic plaque of the aorta and its branches. No abdominal, pelvic, or inguinal lymphadenopathy. Reproductive: Prostate is unremarkable. Other: Trace volume simple  free fluid. No intraperitoneal free gas. No organized fluid collection. Musculoskeletal: Tiny fat containing umbilical hernia. No suspicious lytic or blastic osseous lesions. No acute displaced fracture. IMPRESSION: 1. Acute pancreatitis. Query a developing pseudocyst along the pancreatic tail measuring 2 cm. Recommend attention on follow-up. 2. Likely reactive changes of the greater curvature of the stomach. 3. Persistent stable intra and extrahepatic biliary ductal dilatation with no associated main pancreatic duct dilatation. 4.  Aortic Atherosclerosis (ICD10-I70.0). Electronically  Signed   By: Tish Frederickson M.D.   On: 05/22/2021 22:14    EKG: Independently reviewed. Sinus, no st elevation  Assessment/Plan Acute on chronic pancreatitis Chronically elevated bilirubin w/ persistent CBD dilation     - admitted to inpt, progressive     - continue fluids, anti-emetics, pain control     - can have CLD     - rpt labs     - new pseudocyst seen on CT; spoke with Trixie Deis w/ general surgery, supportive care for now     - Had recent EUS w/ [facility] (04/10/21) for concern of dilated CBD/possible source of pancreatitis; no stones seen on exam and no evidence of CA at the time     - spoke with [facility]; no mass seen on recent EUS; etiology believed secondary chronic EtOH abuse/pancreatitis  High anion gap metabolic acidosis     - check lactic acid     - continue fluids  Elevated LFTs     - check hepatitis panel  EtOH abuse     - counseled against further use     - last drink was 1 week ago; no signs of withdrawal  Hyperglycemia     - No history of DM     - check A1c, follow  HTN     - continue home regimen     - add PRN metoprolol  DVT prophylaxis: lovenox  Code Status: DNR  Family Communication: None at bedside  Consults called: None   Status is: Inpatient  Remains inpatient appropriate because:Inpatient level of care appropriate due to severity of illness  Dispo: The patient is from: Home              Anticipated d/c is to: Home              Patient currently is not medically stable to d/c.   Difficult to place patient No  Time spent coordinating admission: 45 minutes  Vilda Zollner A Jolita Haefner DO [facility]  If 7PM-7AM, please contact night-coverage [email or web address]  05/23/2021, 12:50 PM

## 2021-05-23 NOTE — ED Notes (Signed)
Report to floor

## 2021-05-24 LAB — COMPREHENSIVE METABOLIC PANEL
ALT: 39 U/L (ref 0–44)
AST: 67 U/L — ABNORMAL HIGH (ref 15–41)
Albumin: 3.3 g/dL — ABNORMAL LOW (ref 3.5–5.0)
Alkaline Phosphatase: 42 U/L (ref 38–126)
Anion gap: 10 (ref 5–15)
BUN: 7 mg/dL — ABNORMAL LOW (ref 8–23)
CO2: 29 mmol/L (ref 22–32)
Calcium: 9.2 mg/dL (ref 8.9–10.3)
Chloride: 96 mmol/L — ABNORMAL LOW (ref 98–111)
Creatinine, Ser: 0.67 mg/dL (ref 0.61–1.24)
GFR, Estimated: >60 mL/min (ref >60)
Glucose, Bld: 116 mg/dL — ABNORMAL HIGH (ref 70–99)
Potassium: 3.4 mmol/L — ABNORMAL LOW (ref 3.5–5.1)
Sodium: 135 mmol/L (ref 135–145)
Total Bilirubin: 3.9 mg/dL — ABNORMAL HIGH (ref 0.3–1.2)
Total Protein: 6.6 g/dL (ref 6.5–8.1)

## 2021-05-24 LAB — CBC
HCT: 40.3 % (ref 39.0–52.0)
Hemoglobin: 14.3 g/dL (ref 13.0–17.0)
MCH: 35 pg — ABNORMAL HIGH (ref 26.0–34.0)
MCHC: 35.5 g/dL (ref 30.0–36.0)
MCV: 98.8 fL (ref 80.0–100.0)
Platelets: 112 10*3/uL — ABNORMAL LOW (ref 150–400)
RBC: 4.08 MIL/uL — ABNORMAL LOW (ref 4.22–5.81)
RDW: 14.6 % (ref 11.5–15.5)
WBC: 5.9 10*3/uL (ref 4.0–10.5)
nRBC: 0 % (ref 0.0–0.2)

## 2021-05-24 LAB — HIV ANTIBODY (ROUTINE TESTING W REFLEX): HIV Screen 4th Generation wRfx: NONREACTIVE

## 2021-05-24 LAB — HEPATITIS PANEL, ACUTE
HCV Ab: NONREACTIVE
Hep A IgM: NONREACTIVE
Hep B C IgM: NONREACTIVE
Hepatitis B Surface Ag: NONREACTIVE

## 2021-05-24 MED ORDER — POTASSIUM CHLORIDE CRYS ER 20 MEQ PO TBCR
40.0000 meq | EXTENDED_RELEASE_TABLET | Freq: Once | ORAL | Status: AC
  Administered 2021-05-24: 40 meq via ORAL
  Filled 2021-05-24: qty 2

## 2021-05-24 MED ORDER — ENOXAPARIN SODIUM 40 MG/0.4ML IJ SOSY
40.0000 mg | PREFILLED_SYRINGE | INTRAMUSCULAR | Status: DC
  Administered 2021-05-25: 40 mg via SUBCUTANEOUS
  Filled 2021-05-24: qty 0.4

## 2021-05-24 MED ORDER — MAGNESIUM SULFATE 4 GM/100ML IV SOLN
4.0000 g | Freq: Once | INTRAVENOUS | Status: AC
  Administered 2021-05-24: 4 g via INTRAVENOUS
  Filled 2021-05-24: qty 100

## 2021-05-24 NOTE — Progress Notes (Signed)
PROGRESS NOTE    Jared Duran  QZR:[id number] DOB: [birth year] DOA: 05/22/2021 PCP: Brayton El, PA-C  1511/1511-01   Assessment & Plan:   Active Problems:   Acute pancreatitis   Jared Duran is a 70 y.o. male with medical history significant of EtOH abuse, HTN, GERD. Presenting with 4 days of global abdominal pain. He says it's exactly like his pancreatitis flares. He's had nausea and vomiting. Not able to keep any food down. He hasn't had any diarrhea or fever. He tried some OTC meds for nausea, but they didn't help. He reports the last time he had EtOH was Friday. Since his symptoms did not improve yesterday, he decided to come to the ED for help.    Abdominal pain 2/2 Acute on chronic pancreatitis - new pseudocyst seen on CT; admit physician spoke with Trixie Deis w/ general surgery, supportive care for now --started on IVF, pain control and clear liquid diet Plan: --advance diet as tolerated --d/c IV dilaudid --cont oxycodone PRN --trend lipase --cont LR@100   Chronically elevated bilirubin w/ persistent CBD dilation - Had recent EUS w/ [facility] (04/10/21) for concern of dilated CBD/possible source of pancreatitis; no stones seen on exam and no evidence of CA at the time - admit physician spoke with [facility]; no mass seen on recent EUS; etiology believed secondary chronic EtOH abuse/pancreatitis   High anion gap metabolic acidosis --lactic acid wnl --cont LR@100    Elevated LFTs --AST mildly elevated, likely due to alcohol use   EtOH abuse - last drink was 1 week ago; no signs of withdrawal --encourage cessation   HTN --cont home losartan and nefedipine  Hypokalemia --monitor and replete with oral potassium  Hypomag --monitor and replete with IV mag   DVT prophylaxis: Lovenox SQ Code Status: DNR  Family Communication:  Level of care: Progressive Dispo:   The patient is from: home Anticipated d/c is to: home Anticipated d/c date is: 1-2  days Patient currently is not medically ready to d/c due to: IVF   Subjective and Interval History:  Pt reported abdominal pain much improved, and ready for diet upgrade.  No BM recently.  Voiding a lot.   Objective: Vitals:   05/23/21 2159 05/24/21 0034 05/24/21 0533 05/24/21 1505  BP: (!) 154/100 (!) 152/104 (!) 167/105 (!) 149/105  Pulse: 94 87 75 (!) 103  Resp: 19 18 18 16   Temp: 98.3 F (36.8 C) 98.3 F (36.8 C) 98.2 F (36.8 C) 98.1 F (36.7 C)  TempSrc:  Oral  Oral  SpO2: 90% 94% 94% 90%  Weight:      Height:        Intake/Output Summary (Last 24 hours) at 05/24/2021 1819 Last data filed at 05/24/2021 1707 Gross per 24 hour  Intake 5284.03 ml  Output --  Net 5284.03 ml   Filed Weights   05/22/21 1902  Weight: 63.5 kg    Examination:   Constitutional: NAD, AAOx3 HEENT: conjunctivae and lids normal, EOMI CV: No cyanosis.   RESP: normal respiratory effort, on RA Extremities: No effusions, edema in BLE SKIN: warm, dry Neuro: II - XII grossly intact.   Psych: Normal mood and affect.  Appropriate judgement and reason   Data Reviewed: I have personally reviewed following labs and imaging studies  CBC: Recent Labs  Lab 05/22/21 1911 05/23/21 1427 05/24/21 0449  WBC 9.6 7.2 5.9  NEUTROABS  --  5.7  --   HGB 17.2* 16.1 14.3  HCT 48.0 45.3 40.3  MCV 99.8 100.0  98.8  PLT 149* 124* 112*   Basic Metabolic Panel: Recent Labs  Lab 05/22/21 1911 05/23/21 1427 05/24/21 0449  NA 132* 133* 135  K 3.5 3.1* 3.4*  CL 94* 95* 96*  CO2 21* 26 29  GLUCOSE 140* 112* 116*  BUN 16 12 7*  CREATININE 1.02 0.79 0.67  CALCIUM 9.3 9.2 9.2  MG  --  1.5*  --    GFR: Estimated Creatinine Clearance: 79.4 mL/min (by C-G formula based on SCr of 0.67 mg/dL). Liver Function Tests: Recent Labs  Lab 05/22/21 1911 05/23/21 1427 05/24/21 0449  AST 91* 63* 67*  ALT 43 38 39  ALKPHOS 57 47 42  BILITOT 4.3* 4.8* 3.9*  PROT 8.2* 7.1 6.6  ALBUMIN 4.1 3.4* 3.3*    Recent Labs  Lab 05/22/21 1911  LIPASE 635*   No results for input(s): AMMONIA in the last 168 hours. Coagulation Profile: No results for input(s): INR, PROTIME in the last 168 hours. Cardiac Enzymes: No results for input(s): CKTOTAL, CKMB, CKMBINDEX, TROPONINI in the last 168 hours. BNP (last 3 results) No results for input(s): PROBNP in the last 8760 hours. HbA1C: No results for input(s): HGBA1C in the last 72 hours. CBG: No results for input(s): GLUCAP in the last 168 hours. Lipid Profile: No results for input(s): CHOL, HDL, LDLCALC, TRIG, CHOLHDL, LDLDIRECT in the last 72 hours. Thyroid Function Tests: No results for input(s): TSH, T4TOTAL, FREET4, T3FREE, THYROIDAB in the last 72 hours. Anemia Panel: No results for input(s): VITAMINB12, FOLATE, FERRITIN, TIBC, IRON, RETICCTPCT in the last 72 hours. Sepsis Labs: Recent Labs  Lab 05/23/21 1427 05/23/21 1649  LATICACIDVEN 1.1 1.0    Recent Results (from the past 240 hour(s))  Resp Panel by RT-PCR (Flu A&B, Covid) Nasopharyngeal Swab     Status: None   Collection Time: 05/22/21 10:51 PM   Specimen: Nasopharyngeal Swab; Nasopharyngeal(NP) swabs in vial transport medium  Result Value Ref Range Status   SARS Coronavirus 2 by RT PCR NEGATIVE NEGATIVE Final    Comment: (NOTE) SARS-CoV-2 target nucleic acids are NOT DETECTED.  The SARS-CoV-2 RNA is generally detectable in upper respiratory specimens during the acute phase of infection. The lowest concentration of SARS-CoV-2 viral copies this assay can detect is 138 copies/mL. A negative result does not preclude SARS-Cov-2 infection and should not be used as the sole basis for treatment or other patient management decisions. A negative result may occur with  improper specimen collection/handling, submission of specimen other than nasopharyngeal swab, presence of viral mutation(s) within the areas targeted by this assay, and inadequate number of viral copies(<138  copies/mL). A negative result must be combined with clinical observations, patient history, and epidemiological information. The expected result is Negative.  Fact Sheet for Patients:  BloggerCourse.com  Fact Sheet for Healthcare Providers:  SeriousBroker.it  This test is no t yet approved or cleared by the [place] FDA and  has been authorized for detection and/or diagnosis of SARS-CoV-2 by FDA under an Emergency Use Authorization (EUA). This EUA will remain  in effect (meaning this test can be used) for the duration of the COVID-19 declaration under Section 564(b)(1) of the Act, 21 U.S.C.section 360bbb-3(b)(1), unless the authorization is terminated  or revoked sooner.       Influenza A by PCR NEGATIVE NEGATIVE Final   Influenza B by PCR NEGATIVE NEGATIVE Final    Comment: (NOTE) The Xpert Xpress SARS-CoV-2/FLU/RSV plus assay is intended as an aid in the diagnosis of influenza from Nasopharyngeal swab specimens  and should not be used as a sole basis for treatment. Nasal washings and aspirates are unacceptable for Xpert Xpress SARS-CoV-2/FLU/RSV testing.  Fact Sheet for Patients: BloggerCourse.com  Fact Sheet for Healthcare Providers: SeriousBroker.it  This test is not yet approved or cleared by the [place] FDA and has been authorized for detection and/or diagnosis of SARS-CoV-2 by FDA under an Emergency Use Authorization (EUA). This EUA will remain in effect (meaning this test can be used) for the duration of the COVID-19 declaration under Section 564(b)(1) of the Act, 21 U.S.C. section 360bbb-3(b)(1), unless the authorization is terminated or revoked.  Performed at [facility], [address] [place], [place] [address]       Radiology Studies: CT ABDOMEN PELVIS W CONTRAST  Result Date: 05/22/2021 CLINICAL DATA:  Nausea/vomiting Abdominal  pain, acute, nonlocalized EXAM: CT ABDOMEN AND PELVIS WITH CONTRAST TECHNIQUE: Multidetector CT imaging of the abdomen and pelvis was performed using the standard protocol following bolus administration of intravenous contrast. CONTRAST:  43mL OMNIPAQUE IOHEXOL 350 MG/ML SOLN COMPARISON:  MRI abdomen 02/10/2021, CT abdomen pelvis 02/07/2021 FINDINGS: Lower chest: No acute abnormality. Hepatobiliary: No focal liver abnormality. No gallstones, gallbladder wall thickening, or pericholecystic fluid. Persistent state intra and extrahepatic biliary ductal dilatation. common bile duct measures up to at least 1 cm. Intrahepatic biliary ducts measure at least up to 0.5 cm. Pancreas: Hazy pancreatic contour with associated peripancreatic free fluid and fat stranding. Query a developing pseudocyst along the pancreatic tail measuring 2 cm. No definite focal pancreatic lesion within the pancreatic head which still appears slightly more prominent than the rest of the pancreatic parenchyma. Slightly improved prominence of the pancreatic head compared to prior. No main pancreatic duct dilatation. Spleen: Normal in size without focal abnormality. Adrenals/Urinary Tract: No adrenal nodule bilaterally. Bilateral kidneys enhance symmetrically. Fluid density lesion within the right kidney likely represents a simple renal cyst. Subcentimeter hypodensities are too small to characterize. No hydronephrosis. No hydroureter. The urinary bladder is unremarkable. Stomach/Bowel: Bowel thickening of the greater curvature of the stomach adjacent to the pancreatic tail. Otherwise the stomach is within normal limits. No evidence of bowel wall thickening or dilatation. Vascular/Lymphatic: Limited evaluation for splenic artery pseudoaneurysm on this portal venous phase study. No abdominal aorta or iliac aneurysm. At least moderate atherosclerotic plaque of the aorta and its branches. No abdominal, pelvic, or inguinal lymphadenopathy. Reproductive:  Prostate is unremarkable. Other: Trace volume simple free fluid. No intraperitoneal free gas. No organized fluid collection. Musculoskeletal: Tiny fat containing umbilical hernia. No suspicious lytic or blastic osseous lesions. No acute displaced fracture. IMPRESSION: 1. Acute pancreatitis. Query a developing pseudocyst along the pancreatic tail measuring 2 cm. Recommend attention on follow-up. 2. Likely reactive changes of the greater curvature of the stomach. 3. Persistent stable intra and extrahepatic biliary ductal dilatation with no associated main pancreatic duct dilatation. 4.  Aortic Atherosclerosis (ICD10-I70.0). Electronically Signed   By: Tish Frederickson M.D.   On: 05/22/2021 22:14     Scheduled Meds:  losartan  100 mg Oral Daily   NIFEdipine  60 mg Oral Daily   Continuous Infusions:  lactated ringers 100 mL/hr at 05/24/21 1629   magnesium sulfate bolus IVPB 4 g (05/24/21 1624)     LOS: 1 day     Darlin Priestly, MD [facility] If 7PM-7AM, please contact night-coverage 05/24/2021, 6:19 PM

## 2021-05-25 LAB — BASIC METABOLIC PANEL
Anion gap: 9 (ref 5–15)
BUN: <5 mg/dL — ABNORMAL LOW (ref 8–23)
CO2: 32 mmol/L (ref 22–32)
Calcium: 9.6 mg/dL (ref 8.9–10.3)
Chloride: 94 mmol/L — ABNORMAL LOW (ref 98–111)
Creatinine, Ser: 0.59 mg/dL — ABNORMAL LOW (ref 0.61–1.24)
GFR, Estimated: >60 mL/min (ref >60)
Glucose, Bld: 133 mg/dL — ABNORMAL HIGH (ref 70–99)
Potassium: 3.1 mmol/L — ABNORMAL LOW (ref 3.5–5.1)
Sodium: 135 mmol/L (ref 135–145)

## 2021-05-25 LAB — CBC
HCT: 39.9 % (ref 39.0–52.0)
Hemoglobin: 14.5 g/dL (ref 13.0–17.0)
MCH: 35.1 pg — ABNORMAL HIGH (ref 26.0–34.0)
MCHC: 36.3 g/dL — ABNORMAL HIGH (ref 30.0–36.0)
MCV: 96.6 fL (ref 80.0–100.0)
Platelets: 109 10*3/uL — ABNORMAL LOW (ref 150–400)
RBC: 4.13 MIL/uL — ABNORMAL LOW (ref 4.22–5.81)
RDW: 14.4 % (ref 11.5–15.5)
WBC: 5.4 10*3/uL (ref 4.0–10.5)
nRBC: 0 % (ref 0.0–0.2)

## 2021-05-25 LAB — MAGNESIUM: Magnesium: 1.8 mg/dL (ref 1.7–2.4)

## 2021-05-25 LAB — LIPASE, BLOOD: Lipase: 164 U/L — ABNORMAL HIGH (ref 11–51)

## 2021-05-25 MED ORDER — LOSARTAN POTASSIUM 100 MG PO TABS: 100.0000 mg | ORAL_TABLET | Freq: Every day | ORAL | 0 refills | Status: DC

## 2021-05-25 MED ORDER — POTASSIUM CHLORIDE CRYS ER 20 MEQ PO TBCR
40.0000 meq | EXTENDED_RELEASE_TABLET | Freq: Once | ORAL | Status: AC
  Administered 2021-05-25: 40 meq via ORAL
  Filled 2021-05-25: qty 2

## 2021-05-25 MED ORDER — NIFEDIPINE ER OSMOTIC RELEASE 30 MG PO TB24
30.0000 mg | ORAL_TABLET | Freq: Once | ORAL | Status: DC
  Filled 2021-05-25: qty 1

## 2021-05-25 MED ORDER — NIFEDIPINE ER OSMOTIC RELEASE 60 MG PO TB24: 60.0000 mg | ORAL_TABLET | Freq: Every day | ORAL | 0 refills | Status: DC

## 2021-05-25 NOTE — Discharge Summary (Signed)
Physician Discharge Summary   Jared Duran  male DOB: 08/12/1951  JOI:[id number]  PCP: Brayton El, PA-C  Admit date: 05/22/2021 Discharge date: 05/25/2021  Admitted From: home Disposition:  home CODE STATUS: DNR   Hospital Course:  For full details, please see H&P, progress notes, consult notes and ancillary notes.  Briefly,  Jared Duran is a 70 y.o. male with medical history significant of EtOH abuse, HTN, GERD. Presenting with 4 days of global abdominal pain. He says it's exactly like his pancreatitis flares. He's had nausea and vomiting. Not able to keep any food down.   Abdominal pain 2/2 Acute on chronic pancreatitis - Lipase 635 on presentation.  new pseudocyst seen on CT; admit physician spoke with Trixie Deis w/ general surgery, supportive care for now --started on IVF, pain control and clear liquid diet, and pt's symptoms improved the next day.  Lipase trended down to 164. --Pt was able to tolerated regular diet and abdominal pain resolved prior to discharge.  Chronically elevated bilirubin w/ persistent CBD dilation - Had recent EUS w/ [facility] (04/10/21) for concern of dilated CBD/possible source of pancreatitis; no stones seen on exam and no evidence of CA at the time - admit physician spoke with [facility]; no mass seen on recent EUS; etiology believed secondary chronic EtOH abuse/pancreatitis  Elevated LFTs --AST mildly elevated, likely due to alcohol use   EtOH abuse - last drink was 1 week ago; no signs of withdrawal --encourage cessation   HTN --cont home losartan and nefedipine   Hypokalemia --monitor and replete with oral potassium   Hypomag --monitor and replete with IV mag   Discharge Diagnoses:  Active Problems:   Acute pancreatitis   30 Day Unplanned Readmission Risk Score    Flowsheet Row ED to Hosp-Admission (Current) from 05/22/2021 in [facility]  30 Day Unplanned Readmission Risk  Score (%) 9.74 Filed at 05/25/2021 0800       This score is the patient's risk of an unplanned readmission within 30 days of being discharged (0 -100%). The score is based on dignosis, age, lab data, medications, orders, and past utilization.   Low:  0-14.9   Medium: 15-21.9   High: 22-29.9   Extreme: 30 and above         Discharge Instructions:  Allergies as of 05/25/2021   No Known Allergies      Medication List     TAKE these medications    losartan 100 MG tablet Commonly known as: COZAAR Take 1 tablet (100 mg total) by mouth daily.   NIFEdipine 60 MG 24 hr tablet Commonly known as: PROCARDIA XL/NIFEDICAL XL Take 1 tablet (60 mg total) by mouth daily.   pantoprazole 40 MG tablet Commonly known as: PROTONIX Take 40 mg by mouth daily.   polyethylene glycol 17 g packet Commonly known as: MIRALAX / GLYCOLAX Take 17 g by mouth daily as needed for mild constipation.         Follow-up Information     Drosinis, Leonia Reader, PA-C Follow up in 1 week(s).   Specialty: Internal Medicine Contact information: [address] [place] [address] [phone]                 No Known Allergies   The results of significant diagnostics from this hospitalization (including imaging, microbiology, ancillary and laboratory) are listed below for reference.   Consultations:   Procedures/Studies: CT ABDOMEN PELVIS W CONTRAST  Result Date:  05/22/2021 CLINICAL DATA:  Nausea/vomiting Abdominal pain, acute, nonlocalized EXAM: CT ABDOMEN AND PELVIS WITH CONTRAST TECHNIQUE: Multidetector CT imaging of the abdomen and pelvis was performed using the standard protocol following bolus administration of intravenous contrast. CONTRAST:  70mL OMNIPAQUE IOHEXOL 350 MG/ML SOLN COMPARISON:  MRI abdomen 02/10/2021, CT abdomen pelvis 02/07/2021 FINDINGS: Lower chest: No acute abnormality. Hepatobiliary: No focal liver abnormality. No gallstones, gallbladder wall thickening, or  pericholecystic fluid. Persistent state intra and extrahepatic biliary ductal dilatation. common bile duct measures up to at least 1 cm. Intrahepatic biliary ducts measure at least up to 0.5 cm. Pancreas: Hazy pancreatic contour with associated peripancreatic free fluid and fat stranding. Query a developing pseudocyst along the pancreatic tail measuring 2 cm. No definite focal pancreatic lesion within the pancreatic head which still appears slightly more prominent than the rest of the pancreatic parenchyma. Slightly improved prominence of the pancreatic head compared to prior. No main pancreatic duct dilatation. Spleen: Normal in size without focal abnormality. Adrenals/Urinary Tract: No adrenal nodule bilaterally. Bilateral kidneys enhance symmetrically. Fluid density lesion within the right kidney likely represents a simple renal cyst. Subcentimeter hypodensities are too small to characterize. No hydronephrosis. No hydroureter. The urinary bladder is unremarkable. Stomach/Bowel: Bowel thickening of the greater curvature of the stomach adjacent to the pancreatic tail. Otherwise the stomach is within normal limits. No evidence of bowel wall thickening or dilatation. Vascular/Lymphatic: Limited evaluation for splenic artery pseudoaneurysm on this portal venous phase study. No abdominal aorta or iliac aneurysm. At least moderate atherosclerotic plaque of the aorta and its branches. No abdominal, pelvic, or inguinal lymphadenopathy. Reproductive: Prostate is unremarkable. Other: Trace volume simple free fluid. No intraperitoneal free gas. No organized fluid collection. Musculoskeletal: Tiny fat containing umbilical hernia. No suspicious lytic or blastic osseous lesions. No acute displaced fracture. IMPRESSION: 1. Acute pancreatitis. Query a developing pseudocyst along the pancreatic tail measuring 2 cm. Recommend attention on follow-up. 2. Likely reactive changes of the greater curvature of the stomach. 3. Persistent  stable intra and extrahepatic biliary ductal dilatation with no associated main pancreatic duct dilatation. 4.  Aortic Atherosclerosis (ICD10-I70.0). Electronically Signed   By: Tish Frederickson M.D.   On: 05/22/2021 22:14      Labs: BNP (last 3 results) No results for input(s): BNP in the last 8760 hours. Basic Metabolic Panel: Recent Labs  Lab 05/22/21 1911 05/23/21 1427 05/24/21 0449 05/25/21 0531  NA 132* 133* 135 135  K 3.5 3.1* 3.4* 3.1*  CL 94* 95* 96* 94*  CO2 21* 26 29 32  GLUCOSE 140* 112* 116* 133*  BUN 16 12 7* <5*  CREATININE 1.02 0.79 0.67 0.59*  CALCIUM 9.3 9.2 9.2 9.6  MG  --  1.5*  --  1.8   Liver Function Tests: Recent Labs  Lab 05/22/21 1911 05/23/21 1427 05/24/21 0449  AST 91* 63* 67*  ALT 43 38 39  ALKPHOS 57 47 42  BILITOT 4.3* 4.8* 3.9*  PROT 8.2* 7.1 6.6  ALBUMIN 4.1 3.4* 3.3*   Recent Labs  Lab 05/22/21 1911 05/25/21 0531  LIPASE 635* 164*   No results for input(s): AMMONIA in the last 168 hours. CBC: Recent Labs  Lab 05/22/21 1911 05/23/21 1427 05/24/21 0449 05/25/21 0531  WBC 9.6 7.2 5.9 5.4  NEUTROABS  --  5.7  --   --   HGB 17.2* 16.1 14.3 14.5  HCT 48.0 45.3 40.3 39.9  MCV 99.8 100.0 98.8 96.6  PLT 149* 124* 112* 109*   Cardiac Enzymes: No results  for input(s): CKTOTAL, CKMB, CKMBINDEX, TROPONINI in the last 168 hours. BNP: Invalid input(s): POCBNP CBG: No results for input(s): GLUCAP in the last 168 hours. D-Dimer No results for input(s): DDIMER in the last 72 hours. Hgb A1c No results for input(s): HGBA1C in the last 72 hours. Lipid Profile No results for input(s): CHOL, HDL, LDLCALC, TRIG, CHOLHDL, LDLDIRECT in the last 72 hours. Thyroid function studies No results for input(s): TSH, T4TOTAL, T3FREE, THYROIDAB in the last 72 hours.  Invalid input(s): FREET3 Anemia work up No results for input(s): VITAMINB12, FOLATE, FERRITIN, TIBC, IRON, RETICCTPCT in the last 72 hours. Urinalysis    Component Value  Date/Time   COLORURINE AMBER (A) 05/23/2021 0223   APPEARANCEUR CLEAR 05/23/2021 0223   LABSPEC 1.010 05/23/2021 0223   PHURINE 7.0 05/23/2021 0223   GLUCOSEU NEGATIVE 05/23/2021 0223   HGBUR SMALL (A) 05/23/2021 0223   BILIRUBINUR LARGE (A) 05/23/2021 0223   KETONESUR 80 (A) 05/23/2021 0223   PROTEINUR NEGATIVE 05/23/2021 0223   NITRITE NEGATIVE 05/23/2021 0223   LEUKOCYTESUR NEGATIVE 05/23/2021 0223   Sepsis Labs Invalid input(s): PROCALCITONIN,  WBC,  LACTICIDVEN Microbiology Recent Results (from the past 240 hour(s))  Resp Panel by RT-PCR (Flu A&B, Covid) Nasopharyngeal Swab     Status: None   Collection Time: 05/22/21 10:51 PM   Specimen: Nasopharyngeal Swab; Nasopharyngeal(NP) swabs in vial transport medium  Result Value Ref Range Status   SARS Coronavirus 2 by RT PCR NEGATIVE NEGATIVE Final    Comment: (NOTE) SARS-CoV-2 target nucleic acids are NOT DETECTED.  The SARS-CoV-2 RNA is generally detectable in upper respiratory specimens during the acute phase of infection. The lowest concentration of SARS-CoV-2 viral copies this assay can detect is 138 copies/mL. A negative result does not preclude SARS-Cov-2 infection and should not be used as the sole basis for treatment or other patient management decisions. A negative result may occur with  improper specimen collection/handling, submission of specimen other than nasopharyngeal swab, presence of viral mutation(s) within the areas targeted by this assay, and inadequate number of viral copies(<138 copies/mL). A negative result must be combined with clinical observations, patient history, and epidemiological information. The expected result is Negative.  Fact Sheet for Patients:  BloggerCourse.com  Fact Sheet for Healthcare Providers:  SeriousBroker.it  This test is no t yet approved or cleared by the [place] FDA and  has been authorized for detection and/or  diagnosis of SARS-CoV-2 by FDA under an Emergency Use Authorization (EUA). This EUA will remain  in effect (meaning this test can be used) for the duration of the COVID-19 declaration under Section 564(b)(1) of the Act, 21 U.S.C.section 360bbb-3(b)(1), unless the authorization is terminated  or revoked sooner.       Influenza A by PCR NEGATIVE NEGATIVE Final   Influenza B by PCR NEGATIVE NEGATIVE Final    Comment: (NOTE) The Xpert Xpress SARS-CoV-2/FLU/RSV plus assay is intended as an aid in the diagnosis of influenza from Nasopharyngeal swab specimens and should not be used as a sole basis for treatment. Nasal washings and aspirates are unacceptable for Xpert Xpress SARS-CoV-2/FLU/RSV testing.  Fact Sheet for Patients: BloggerCourse.com  Fact Sheet for Healthcare Providers: SeriousBroker.it  This test is not yet approved or cleared by the [place] FDA and has been authorized for detection and/or diagnosis of SARS-CoV-2 by FDA under an Emergency Use Authorization (EUA). This EUA will remain in effect (meaning this test can be used) for the duration of the COVID-19 declaration under Section 564(b)(1) of the Act,  21 U.S.C. section 360bbb-3(b)(1), unless the authorization is terminated or revoked.  Performed at [facility], [address] [place], [place] [address]      Total time spend on discharging this patient, including the last patient exam, discussing the hospital stay, instructions for ongoing care as it relates to all pertinent caregivers, as well as preparing the medical discharge records, prescriptions, and/or referrals as applicable, is 30 minutes.    Darlin Priestly, MD  [facility] 05/25/2021, 9:17 AM

## 2021-06-25 ENCOUNTER — Ambulatory Visit: Payer: Medicare Other | Attending: Internal Medicine

## 2021-06-25 ENCOUNTER — Other Ambulatory Visit (HOSPITAL_BASED_OUTPATIENT_CLINIC_OR_DEPARTMENT_OTHER): Payer: Self-pay

## 2021-06-25 DIAGNOSIS — Z23 Encounter for immunization: Secondary | ICD-10-CM

## 2021-06-25 MED ORDER — INFLUENZA VAC A&B SA ADJ QUAD 0.5 ML IM PRSY
PREFILLED_SYRINGE | INTRAMUSCULAR | 0 refills | Status: DC
  Filled 2021-06-25: qty 0.5, 1d supply, fill #0

## 2021-06-25 MED ORDER — SHINGRIX 50 MCG/0.5ML IM SUSR
INTRAMUSCULAR | 0 refills | Status: DC
  Filled 2021-06-25: qty 0.5, 1d supply, fill #0

## 2021-06-25 NOTE — Progress Notes (Signed)
   Covid-19 Vaccination Clinic  Name:  Mehmet Scally    MRN: [id number] DOB: 26-Mar-1951  06/25/2021  Mr. Hasler was observed post Covid-19 immunization for 15 minutes without incident. He was provided with Vaccine Information Sheet and instruction to access the V-Safe system.   Mr. Edmonston was instructed to call 911 with any severe reactions post vaccine: Difficulty breathing  Swelling of face and throat  A fast heartbeat  A bad rash all over body  Dizziness and weakness

## 2021-07-05 ENCOUNTER — Other Ambulatory Visit (HOSPITAL_BASED_OUTPATIENT_CLINIC_OR_DEPARTMENT_OTHER): Payer: Self-pay

## 2021-07-23 ENCOUNTER — Other Ambulatory Visit (HOSPITAL_BASED_OUTPATIENT_CLINIC_OR_DEPARTMENT_OTHER): Payer: Self-pay

## 2021-07-23 MED ORDER — MODERNA COVID-19 BIVAL BOOSTER 50 MCG/0.5ML IM SUSP
INTRAMUSCULAR | 0 refills | Status: DC
  Filled 2021-07-23: qty 0.5, 1d supply, fill #0

## 2022-01-04 ENCOUNTER — Emergency Department (HOSPITAL_BASED_OUTPATIENT_CLINIC_OR_DEPARTMENT_OTHER): Payer: Medicare PPO

## 2022-01-04 ENCOUNTER — Encounter (HOSPITAL_BASED_OUTPATIENT_CLINIC_OR_DEPARTMENT_OTHER): Payer: Self-pay | Admitting: Emergency Medicine

## 2022-01-04 ENCOUNTER — Inpatient Hospital Stay (HOSPITAL_BASED_OUTPATIENT_CLINIC_OR_DEPARTMENT_OTHER)
Admission: EM | Admit: 2022-01-04 | Discharge: 2022-01-07 | DRG: 438 | Disposition: A | Discharge status: Home / Self Care | Payer: Medicare PPO | Attending: Internal Medicine | Admitting: Internal Medicine

## 2022-01-04 ENCOUNTER — Other Ambulatory Visit: Payer: Self-pay

## 2022-01-04 DIAGNOSIS — N179 Acute kidney failure, unspecified: Secondary | ICD-10-CM | POA: Diagnosis present

## 2022-01-04 DIAGNOSIS — F17210 Nicotine dependence, cigarettes, uncomplicated: Secondary | ICD-10-CM | POA: Diagnosis present

## 2022-01-04 DIAGNOSIS — E860 Dehydration: Secondary | ICD-10-CM | POA: Diagnosis present

## 2022-01-04 DIAGNOSIS — E43 Unspecified severe protein-calorie malnutrition: Secondary | ICD-10-CM | POA: Insufficient documentation

## 2022-01-04 DIAGNOSIS — K861 Other chronic pancreatitis: Secondary | ICD-10-CM | POA: Diagnosis present

## 2022-01-04 DIAGNOSIS — F10239 Alcohol dependence with withdrawal, unspecified: Secondary | ICD-10-CM | POA: Diagnosis present

## 2022-01-04 DIAGNOSIS — K863 Pseudocyst of pancreas: Secondary | ICD-10-CM | POA: Diagnosis present

## 2022-01-04 DIAGNOSIS — Z66 Do not resuscitate: Secondary | ICD-10-CM | POA: Diagnosis present

## 2022-01-04 DIAGNOSIS — F1010 Alcohol abuse, uncomplicated: Secondary | ICD-10-CM | POA: Diagnosis present

## 2022-01-04 DIAGNOSIS — K852 Alcohol induced acute pancreatitis without necrosis or infection: Principal | ICD-10-CM | POA: Diagnosis present

## 2022-01-04 DIAGNOSIS — I10 Essential (primary) hypertension: Secondary | ICD-10-CM | POA: Diagnosis present

## 2022-01-04 DIAGNOSIS — Z6820 Body mass index (BMI) 20.0-20.9, adult: Secondary | ICD-10-CM

## 2022-01-04 DIAGNOSIS — K2100 Gastro-esophageal reflux disease with esophagitis, without bleeding: Secondary | ICD-10-CM | POA: Diagnosis present

## 2022-01-04 DIAGNOSIS — K859 Acute pancreatitis without necrosis or infection, unspecified: Secondary | ICD-10-CM | POA: Diagnosis present

## 2022-01-04 LAB — COMPREHENSIVE METABOLIC PANEL
ALT: 8 U/L (ref 0–44)
AST: 16 U/L (ref 15–41)
Albumin: 3.3 g/dL — ABNORMAL LOW (ref 3.5–5.0)
Alkaline Phosphatase: 40 U/L (ref 38–126)
Anion gap: 9 (ref 5–15)
BUN: 37 mg/dL — ABNORMAL HIGH (ref 8–23)
CO2: 29 mmol/L (ref 22–32)
Calcium: 9 mg/dL (ref 8.9–10.3)
Chloride: 96 mmol/L — ABNORMAL LOW (ref 98–111)
Creatinine, Ser: 1.63 mg/dL — ABNORMAL HIGH (ref 0.61–1.24)
GFR, Estimated: 46 mL/min — ABNORMAL LOW (ref >60)
Glucose, Bld: 143 mg/dL — ABNORMAL HIGH (ref 70–99)
Potassium: 3.6 mmol/L (ref 3.5–5.1)
Sodium: 134 mmol/L — ABNORMAL LOW (ref 135–145)
Total Bilirubin: 0.4 mg/dL (ref 0.3–1.2)
Total Protein: 7.2 g/dL (ref 6.5–8.1)

## 2022-01-04 LAB — CBC
HCT: 42.7 % (ref 39.0–52.0)
Hemoglobin: 14.9 g/dL (ref 13.0–17.0)
MCH: 36.1 pg — ABNORMAL HIGH (ref 26.0–34.0)
MCHC: 34.9 g/dL (ref 30.0–36.0)
MCV: 103.4 fL — ABNORMAL HIGH (ref 80.0–100.0)
Platelets: 174 10*3/uL (ref 150–400)
RBC: 4.13 MIL/uL — ABNORMAL LOW (ref 4.22–5.81)
RDW: 13.2 % (ref 11.5–15.5)
WBC: 9 10*3/uL (ref 4.0–10.5)
nRBC: 0 % (ref 0.0–0.2)

## 2022-01-04 LAB — URINALYSIS, ROUTINE W REFLEX MICROSCOPIC
Bilirubin Urine: NEGATIVE
Glucose, UA: NEGATIVE mg/dL
Ketones, ur: NEGATIVE mg/dL
Leukocytes,Ua: NEGATIVE
Nitrite: NEGATIVE
Protein, ur: NEGATIVE mg/dL
Specific Gravity, Urine: 1.01 (ref 1.005–1.030)
pH: 7 (ref 5.0–8.0)

## 2022-01-04 LAB — URINALYSIS, MICROSCOPIC (REFLEX)

## 2022-01-04 LAB — TROPONIN I (HIGH SENSITIVITY): Troponin I (High Sensitivity): 3 ng/L (ref <18)

## 2022-01-04 LAB — LIPASE, BLOOD: Lipase: 446 U/L — ABNORMAL HIGH (ref 11–51)

## 2022-01-04 MED ORDER — MORPHINE SULFATE (PF) 4 MG/ML IV SOLN
4.0000 mg | Freq: Once | INTRAVENOUS | Status: AC
  Administered 2022-01-04: 4 mg via INTRAVENOUS
  Filled 2022-01-04: qty 1

## 2022-01-04 MED ORDER — IOHEXOL 300 MG/ML  SOLN
100.0000 mL | Freq: Once | INTRAMUSCULAR | Status: AC | PRN
Start: 2022-01-04 — End: 2022-01-04
  Administered 2022-01-04: 100 mL via INTRAVENOUS

## 2022-01-04 MED ORDER — ONDANSETRON HCL 4 MG/2ML IJ SOLN
4.0000 mg | Freq: Once | INTRAMUSCULAR | Status: AC | PRN
  Administered 2022-01-04: 4 mg via INTRAVENOUS
  Filled 2022-01-04: qty 2

## 2022-01-04 MED ORDER — LACTATED RINGERS IV BOLUS
1000.0000 mL | Freq: Once | INTRAVENOUS | Status: AC
  Administered 2022-01-04: 1000 mL via INTRAVENOUS

## 2022-01-04 NOTE — ED Triage Notes (Signed)
Pt arrives pov, slow gait to triage, c/o LUQ pain with nausea and CP x 2 weeks, hx of pancreatitis.  ?

## 2022-01-04 NOTE — ED Provider Notes (Signed)
?[facility]PARTMENT ?Provider Note ? ? ?CSN: [id number] ?Arrival date & time: 01/04/22  1815 ? ?  ? ?History ? ?Chief Complaint  ?Patient presents with  ? Abdominal Pain  ? ? ?Jared Duran is a 71 y.o. male. ? ?Patient with history of hypertension and pancreatitis presents today with epigastric abdominal pain. He states that same has been ongoing for the past week and a half with worsening over the past few days. States that the pain is in his epigastric region and radiates to his chest. History of similar states that this pain is very similar to his pancreatitis pain in the past. States that he did have history of alcohol abuse but has not had any alcohol in the past few weeks. Endorses some associated nausea with 1 episode of NBNB emesis earlier today. No diarrhea. Additionally, patient states that he is having chest pain and shortness of breath since the onset of his pain. States that this is likely related to his pain as he normally feels this way when he is having a pancreatitis flare.  ? ?The history is provided by the patient. No language interpreter was used.  ?Abdominal Pain ?Associated symptoms: chest pain, nausea, shortness of breath and vomiting   ?Associated symptoms: no chills, no diarrhea and no fever   ? ?  ? ?Home Medications ?Prior to Admission medications   ?Medication Sig Start Date End Date Taking? Authorizing Provider  ?COVID-19 mRNA bivalent vaccine, Moderna, (MODERNA COVID-19 BIVAL BOOSTER) 50 MCG/0.5ML injection Inject into the muscle. 06/25/21   Carlyle Basques, MD  ?influenza vaccine adjuvanted (FLUAD) 0.5 ML injection Inject into the muscle. 06/25/21   Carlyle Basques, MD  ?losartan (COZAAR) 100 MG tablet Take 1 tablet (100 mg total) by mouth daily. 05/25/21 08/23/21  Enzo Bi, MD  ?NIFEdipine (PROCARDIA XL/NIFEDICAL XL) 60 MG 24 hr tablet Take 1 tablet (60 mg total) by mouth daily. 05/25/21 08/23/21  Enzo Bi, MD  ?pantoprazole (PROTONIX) 40 MG tablet Take 40  mg by mouth daily. 11/12/20   [provider]  ?polyethylene glycol (MIRALAX / GLYCOLAX) 17 g packet Take 17 g by mouth daily as needed for mild constipation. 02/12/21   Kerney Elbe, DO  ?Zoster Vaccine Adjuvanted [facility]) injection Inject into the muscle. 06/25/21   Carlyle Basques, MD  ?   ? ?Allergies    ?Patient has no known allergies.   ? ?Review of Systems   ?Review of Systems  ?Constitutional:  Negative for chills and fever.  ?Respiratory:  Positive for shortness of breath.   ?Cardiovascular:  Positive for chest pain.  ?Gastrointestinal:  Positive for abdominal pain, nausea and vomiting. Negative for diarrhea.  ?All other systems reviewed and are negative. ? ?Physical Exam ?Updated Vital Signs ?BP (!) 134/105 (BP Location: Right Arm)   Pulse (!) 101   Temp 97.6 ?F (36.4 ?C) (Oral)   Resp (!) 28   Ht 5\' 9"  (1.753 m)   Wt 63.5 kg   SpO2 95%   BMI 20.67 kg/m?  ?Physical Exam ?Vitals and nursing note reviewed.  ?Constitutional:   ?   General: He is not in acute distress. ?   Appearance: Normal appearance. He is normal weight. He is not ill-appearing, toxic-appearing or diaphoretic.  ?HENT:  ?   Head: Normocephalic and atraumatic.  ?Cardiovascular:  ?   Rate and Rhythm: Normal rate and regular rhythm.  ?   Heart sounds: Normal heart sounds.  ?Pulmonary:  ?   Effort: Pulmonary effort is normal.  No respiratory distress.  ?   Breath sounds: Normal breath sounds.  ?Abdominal:  ?   General: Abdomen is flat.  ?   Palpations: Abdomen is soft.  ?   Tenderness: There is generalized abdominal tenderness and tenderness in the epigastric area.  ?Musculoskeletal:     ?   General: Normal range of motion.  ?   Cervical back: Normal range of motion.  ?Skin: ?   General: Skin is warm and dry.  ?Neurological:  ?   General: No focal deficit present.  ?   Mental Status: He is alert.  ?Psychiatric:     ?   Mood and Affect: Mood normal.     ?   Behavior: Behavior normal.  ? ? ?ED Results / Procedures / Treatments    ?Labs ?(all labs ordered are listed, but only abnormal results are displayed) ?Labs Reviewed  ?LIPASE, BLOOD - Abnormal; Notable for the following components:  ?    Result Value  ? Lipase 446 (*)   ? All other components within normal limits  ?COMPREHENSIVE METABOLIC PANEL - Abnormal; Notable for the following components:  ? Sodium 134 (*)   ? Chloride 96 (*)   ? Glucose, Bld 143 (*)   ? BUN 37 (*)   ? Creatinine, Ser 1.63 (*)   ? Albumin 3.3 (*)   ? GFR, Estimated 46 (*)   ? All other components within normal limits  ?CBC - Abnormal; Notable for the following components:  ? RBC 4.13 (*)   ? MCV 103.4 (*)   ? MCH 36.1 (*)   ? All other components within normal limits  ?URINALYSIS, ROUTINE W REFLEX MICROSCOPIC - Abnormal; Notable for the following components:  ? Hgb urine dipstick TRACE (*)   ? All other components within normal limits  ?URINALYSIS, MICROSCOPIC (REFLEX) - Abnormal; Notable for the following components:  ? Bacteria, UA FEW (*)   ? All other components within normal limits  ?ETHANOL  ?RAPID URINE DRUG SCREEN, HOSP PERFORMED  ?TROPONIN I (HIGH SENSITIVITY)  ?TROPONIN I (HIGH SENSITIVITY)  ? ? ?EKG ?None ? ?Radiology ?DG Chest 2 View ? ?Result Date: 01/04/2022 ?CLINICAL DATA:  Chest pain, shortness of breath EXAM: CHEST - 2 VIEW COMPARISON:  04/10/2020 FINDINGS: Lungs are clear.  No pleural effusion or pneumothorax. The heart is normal in size.  Tortuous aorta. Degenerative changes of the visualized thoracolumbar spine. IMPRESSION: Normal chest radiographs. Electronically Signed   By: Julian Hy M.D.   On: 01/04/2022 21:31  ? ?CT ABDOMEN PELVIS W CONTRAST ? ?Result Date: 01/04/2022 ?CLINICAL DATA:  Pancreatitis EXAM: CT ABDOMEN AND PELVIS WITH CONTRAST TECHNIQUE: Multidetector CT imaging of the abdomen and pelvis was performed using the standard protocol following bolus administration of intravenous contrast. RADIATION DOSE REDUCTION: This exam was performed according to the departmental  dose-optimization program which includes automated exposure control, adjustment of the mA and/or kV according to patient size and/or use of iterative reconstruction technique. CONTRAST:  161mL OMNIPAQUE IOHEXOL 300 MG/ML  SOLN COMPARISON:  05/22/2021 FINDINGS: Lower chest: Unremarkable. Hepatobiliary: Liver measures 16.5 cm in length. There is 7 mm fluid density lesion in the right lobe with no significant change. There is prominence of intrahepatic bile ducts. Distal common bile duct in the head of the pancreas measures 10.6 mm. Gallbladder is not distended. Small ascites is noted in the porta hepatis and along the anterior margin of the liver. Pancreas: There is marked edema around the pancreas. There is 1.4 cm fluid density  lesion in the head of the pancreas which measured 7 mm in diameter in the previous study. There is mild dilation of pancreatic duct. There is interval clearing of 2 cm loculated fluid collection in the tail of the pancreas. There is no demonstrable focal absence of vascular enhancement in the pancreas. There are no new loculated fluid collections in the or around the pancreas. Spleen: Unremarkable. Adrenals/Urinary Tract: Adrenals are unremarkable. There is no hydronephrosis. There are no renal or ureteral stones. There are few low-density lesions in the renal cortex largest measuring 2.5 cm in the upper pole of right kidney. There are no renal or ureteral stones. Urinary bladder is not distended. There is mild diffuse wall thickening in the bladder which may be partly due to incomplete distention. Possibility of cystitis is not excluded. Stomach/Bowel: Small hiatal hernia is seen. There is mucosal enhancement in the stomach, possibly suggesting nonspecific gastritis. There is no significant small bowel dilation. There is mild wall thickening in the small bowel loops. Appendix is not dilated. Cecum is higher than usual in the position appendix is noted anterior to the duodenum. There is  distention of cecum ascending and transverse colon. Scattered diverticula are seen in the left colon without signs of focal acute diverticulitis. Vascular/Lymphatic: There are scattered atherosclerotic plaques and calcifica

## 2022-01-04 NOTE — Progress Notes (Signed)
  [facility] will assume care on arrival to accepting facility. Until arrival, care as per EDP. However, [facility] available 24/7 for questions and assistance.   Nursing staff please page [facility] Admits and Consults ([phone]) as soon as the patient arrives to the hospital.  Shrita Thien, DO [facility]  

## 2022-01-05 DIAGNOSIS — K863 Pseudocyst of pancreas: Secondary | ICD-10-CM | POA: Diagnosis present

## 2022-01-05 DIAGNOSIS — I10 Essential (primary) hypertension: Secondary | ICD-10-CM | POA: Diagnosis present

## 2022-01-05 DIAGNOSIS — Z6820 Body mass index (BMI) 20.0-20.9, adult: Secondary | ICD-10-CM | POA: Diagnosis not present

## 2022-01-05 DIAGNOSIS — N179 Acute kidney failure, unspecified: Secondary | ICD-10-CM | POA: Diagnosis present

## 2022-01-05 DIAGNOSIS — K861 Other chronic pancreatitis: Secondary | ICD-10-CM | POA: Diagnosis present

## 2022-01-05 DIAGNOSIS — E43 Unspecified severe protein-calorie malnutrition: Secondary | ICD-10-CM | POA: Diagnosis present

## 2022-01-05 DIAGNOSIS — K859 Acute pancreatitis without necrosis or infection, unspecified: Secondary | ICD-10-CM | POA: Diagnosis present

## 2022-01-05 DIAGNOSIS — F1010 Alcohol abuse, uncomplicated: Secondary | ICD-10-CM | POA: Diagnosis not present

## 2022-01-05 DIAGNOSIS — F17210 Nicotine dependence, cigarettes, uncomplicated: Secondary | ICD-10-CM | POA: Diagnosis present

## 2022-01-05 DIAGNOSIS — F10239 Alcohol dependence with withdrawal, unspecified: Secondary | ICD-10-CM | POA: Diagnosis present

## 2022-01-05 DIAGNOSIS — E860 Dehydration: Secondary | ICD-10-CM | POA: Diagnosis present

## 2022-01-05 DIAGNOSIS — K852 Alcohol induced acute pancreatitis without necrosis or infection: Secondary | ICD-10-CM

## 2022-01-05 DIAGNOSIS — Z66 Do not resuscitate: Secondary | ICD-10-CM | POA: Diagnosis present

## 2022-01-05 DIAGNOSIS — K2100 Gastro-esophageal reflux disease with esophagitis, without bleeding: Secondary | ICD-10-CM | POA: Diagnosis present

## 2022-01-05 LAB — RAPID URINE DRUG SCREEN, HOSP PERFORMED
Amphetamines: NOT DETECTED
Barbiturates: NOT DETECTED
Benzodiazepines: NOT DETECTED
Cocaine: NOT DETECTED
Opiates: POSITIVE — AB
Tetrahydrocannabinol: NOT DETECTED

## 2022-01-05 LAB — COMPREHENSIVE METABOLIC PANEL
ALT: 8 U/L (ref 0–44)
AST: 13 U/L — ABNORMAL LOW (ref 15–41)
Albumin: 2.6 g/dL — ABNORMAL LOW (ref 3.5–5.0)
Alkaline Phosphatase: 31 U/L — ABNORMAL LOW (ref 38–126)
Anion gap: 8 (ref 5–15)
BUN: 29 mg/dL — ABNORMAL HIGH (ref 8–23)
CO2: 28 mmol/L (ref 22–32)
Calcium: 9.1 mg/dL (ref 8.9–10.3)
Chloride: 99 mmol/L (ref 98–111)
Creatinine, Ser: 1.42 mg/dL — ABNORMAL HIGH (ref 0.61–1.24)
GFR, Estimated: 54 mL/min — ABNORMAL LOW (ref >60)
Glucose, Bld: 119 mg/dL — ABNORMAL HIGH (ref 70–99)
Potassium: 4.2 mmol/L (ref 3.5–5.1)
Sodium: 135 mmol/L (ref 135–145)
Total Bilirubin: 0.5 mg/dL (ref 0.3–1.2)
Total Protein: 5.4 g/dL — ABNORMAL LOW (ref 6.5–8.1)

## 2022-01-05 LAB — CBC
HCT: 36.6 % — ABNORMAL LOW (ref 39.0–52.0)
Hemoglobin: 12.4 g/dL — ABNORMAL LOW (ref 13.0–17.0)
MCH: 35.5 pg — ABNORMAL HIGH (ref 26.0–34.0)
MCHC: 33.9 g/dL (ref 30.0–36.0)
MCV: 104.9 fL — ABNORMAL HIGH (ref 80.0–100.0)
Platelets: 138 10*3/uL — ABNORMAL LOW (ref 150–400)
RBC: 3.49 MIL/uL — ABNORMAL LOW (ref 4.22–5.81)
RDW: 13.2 % (ref 11.5–15.5)
WBC: 7.5 10*3/uL (ref 4.0–10.5)
nRBC: 0 % (ref 0.0–0.2)

## 2022-01-05 LAB — ETHANOL: Alcohol, Ethyl (B): <10 mg/dL (ref <10)

## 2022-01-05 LAB — TROPONIN I (HIGH SENSITIVITY): Troponin I (High Sensitivity): 5 ng/L (ref <18)

## 2022-01-05 LAB — LIPASE, BLOOD: Lipase: 393 U/L — ABNORMAL HIGH (ref 11–51)

## 2022-01-05 MED ORDER — PNEUMOCOCCAL 20-VAL CONJ VACC 0.5 ML IM SUSY
0.5000 mL | PREFILLED_SYRINGE | INTRAMUSCULAR | Status: DC
  Filled 2022-01-05: qty 0.5

## 2022-01-05 MED ORDER — LORAZEPAM 2 MG/ML IJ SOLN: 1.0000 mg | INTRAMUSCULAR | Status: DC | PRN

## 2022-01-05 MED ORDER — ACETAMINOPHEN 650 MG RE SUPP: 650.0000 mg | Freq: Four times a day (QID) | RECTAL | Status: DC | PRN

## 2022-01-05 MED ORDER — ONDANSETRON HCL 4 MG/2ML IJ SOLN: 4.0000 mg | Freq: Four times a day (QID) | INTRAMUSCULAR | Status: DC | PRN

## 2022-01-05 MED ORDER — ENOXAPARIN SODIUM 40 MG/0.4ML IJ SOSY
40.0000 mg | PREFILLED_SYRINGE | INTRAMUSCULAR | Status: DC
  Administered 2022-01-05 – 2022-01-07 (×3): 40 mg via SUBCUTANEOUS
  Filled 2022-01-05 (×3): qty 0.4

## 2022-01-05 MED ORDER — ACETAMINOPHEN 325 MG PO TABS: 650.0000 mg | ORAL_TABLET | Freq: Four times a day (QID) | ORAL | Status: DC | PRN

## 2022-01-05 MED ORDER — LACTATED RINGERS IV SOLN: INTRAVENOUS | Status: DC

## 2022-01-05 MED ORDER — ADULT MULTIVITAMIN W/MINERALS CH
1.0000 | ORAL_TABLET | Freq: Every day | ORAL | Status: DC
  Administered 2022-01-05 – 2022-01-07 (×3): 1 via ORAL
  Filled 2022-01-05 (×3): qty 1

## 2022-01-05 MED ORDER — LORAZEPAM 1 MG PO TABS: 1.0000 mg | ORAL_TABLET | ORAL | Status: DC | PRN

## 2022-01-05 MED ORDER — FAMOTIDINE 20 MG PO TABS
20.0000 mg | ORAL_TABLET | Freq: Two times a day (BID) | ORAL | Status: DC
Start: 2022-01-05 — End: 2022-01-07
  Administered 2022-01-05 – 2022-01-07 (×5): 20 mg via ORAL
  Filled 2022-01-05 (×5): qty 1

## 2022-01-05 MED ORDER — THIAMINE HCL 100 MG PO TABS
100.0000 mg | ORAL_TABLET | Freq: Every day | ORAL | Status: DC
  Administered 2022-01-05 – 2022-01-07 (×3): 100 mg via ORAL
  Filled 2022-01-05 (×3): qty 1

## 2022-01-05 MED ORDER — MORPHINE SULFATE (PF) 4 MG/ML IV SOLN
4.0000 mg | INTRAVENOUS | Status: DC | PRN
  Administered 2022-01-05: 4 mg via INTRAVENOUS
  Filled 2022-01-05: qty 1

## 2022-01-05 MED ORDER — THIAMINE HCL 100 MG/ML IJ SOLN: 100.0000 mg | Freq: Every day | INTRAMUSCULAR | Status: DC

## 2022-01-05 MED ORDER — FOLIC ACID 1 MG PO TABS
1.0000 mg | ORAL_TABLET | Freq: Every day | ORAL | Status: DC
  Administered 2022-01-05 – 2022-01-07 (×3): 1 mg via ORAL
  Filled 2022-01-05 (×3): qty 1

## 2022-01-05 MED ORDER — ONDANSETRON HCL 4 MG PO TABS: 4.0000 mg | ORAL_TABLET | Freq: Four times a day (QID) | ORAL | Status: DC | PRN

## 2022-01-05 MED ORDER — MORPHINE SULFATE (PF) 2 MG/ML IV SOLN
4.0000 mg | INTRAVENOUS | Status: DC | PRN
  Administered 2022-01-05 (×2): 4 mg via INTRAVENOUS
  Filled 2022-01-05 (×2): qty 2

## 2022-01-05 NOTE — Assessment & Plan Note (Signed)
[facility] 

## 2022-01-05 NOTE — Consult Note (Signed)
Referring Provider: Dr. Julian Reil ?Primary Care Physician:  Evlyn Kanner, MD ?Primary Gastroenterologist:  Dr. Dulce Sellar ? ?Reason for Consultation:  Acute Pancreatitis ? ?HPI: Jared Duran is a 71 y.o. male with a history of alcohol abuse and chronic pancreatitis admitted for acute on chronic pancreatitis. He was hospitalized last year for pancreatitis and had a 2 cm pseudocyst in the pancreatic tail that resolved on its own and CT on this admit showed a new 1.2 cm pseudocyst in the pancreatic head. He reports stopping heavy alcohol use a while ago and drinking occasionally now last 2 weeks ago. Having abdominal pain for the past 1.5 weeks with nausea and one episode of nonbloody vomiting yesterday. Lipase 446 (164 in 9/22). ? ? ?PMH: Chronica Pancreatitis; HTN ? ? ? ?Past Surgical History:  ?Procedure Laterality Date  ? ESOPHAGOGASTRODUODENOSCOPY (EGD) WITH PROPOFOL N/A 04/10/2021  ? Procedure: ESOPHAGOGASTRODUODENOSCOPY (EGD) WITH PROPOFOL;  Surgeon: Willis Modena, MD;  Location: [facility];  Service: Endoscopy;  Laterality: N/A;  ? EUS N/A 04/10/2021  ? Procedure: UPPER ENDOSCOPIC ULTRASOUND (EUS) LINEAR;  Surgeon: Willis Modena, MD;  Location: [facility];  Service: Endoscopy;  Laterality: N/A;  ? ? ?Prior to Admission medications   ?Medication Sig Start Date End Date Taking? Authorizing Provider  ?calcium carbonate (TUMS EX) 750 MG chewable tablet Chew 1,500 mg by mouth daily as needed for heartburn.   Yes [provider]  ?losartan (COZAAR) 100 MG tablet Take 1 tablet (100 mg total) by mouth daily. 05/25/21 01/05/22 Yes Darlin Priestly, MD  ?NIFEdipine (ADALAT CC) 90 MG 24 hr tablet Take 90 mg by mouth daily. 01/04/22  Yes [provider]  ?COVID-19 mRNA bivalent vaccine, Moderna, (MODERNA COVID-19 BIVAL BOOSTER) 50 MCG/0.5ML injection Inject into the muscle. 06/25/21   Judyann Munson, MD  ?influenza vaccine adjuvanted (FLUAD) 0.5 ML injection Inject into the muscle. 06/25/21   Judyann Munson, MD  ?Zoster Vaccine Adjuvanted [facility]) injection Inject into the muscle. 06/25/21   Judyann Munson, MD  ? ? ?Scheduled Meds: ? enoxaparin (LOVENOX) injection  40 mg Subcutaneous Q24H  ? famotidine  20 mg Oral BID  ? folic acid  1 mg Oral Daily  ? multivitamin with minerals  1 tablet Oral Daily  ? thiamine  100 mg Oral Daily  ? Or  ? thiamine  100 mg Intravenous Daily  ? ?Continuous Infusions: ? lactated ringers 125 mL/hr at 01/05/22 0333  ? ?PRN Meds:.acetaminophen **OR** acetaminophen, LORazepam **OR** LORazepam, morphine injection, ondansetron **OR** ondansetron (ZOFRAN) IV ? ?Allergies as of 01/04/2022  ? (No Known Allergies)  ? ? ?History reviewed. No pertinent family history. ? ?Social History  ? ?Socioeconomic History  ? Marital status: Legally Separated  ?  Spouse name: Not on file  ? Number of children: Not on file  ? Years of education: Not on file  ? Highest education level: Not on file  ?Occupational History  ? Not on file  ?Tobacco Use  ? Smoking status: Every Day  ?  Packs/day: 1.00  ?  Types: Cigarettes  ? Smokeless tobacco: Never  ?Substance and Sexual Activity  ? Alcohol use: Yes  ?  Comment: occ  ? Drug use: No  ? Sexual activity: Not on file  ?Other Topics Concern  ? Not on file  ?Social History Narrative  ? Not on file  ? ?Social Determinants of Health  ? ?Financial Resource Strain: Not on file  ?Food Insecurity: Not on file  ?Transportation Needs: Not on file  ?Physical Activity: Not  on file  ?Stress: Not on file  ?Social Connections: Not on file  ?Intimate Partner Violence: Not on file  ? ? ?Review of Systems: All negative except as stated above in HPI. ? ?Physical Exam: ?Vital signs: ?Vitals:  ? 01/05/22 0200 01/05/22 0831  ?BP: 136/87 101/71  ?Pulse: 80 80  ?Resp: 20 19  ?Temp: 97.6 ?F (36.4 ?C) (!) 97.5 ?F (36.4 ?C)  ?SpO2: 100% 100%  ? ?  ?General:  Lethargic, chronically ill-appearing, thin, no acute distress  ?Head: normocephalic, atraumatic ?Eyes: anicteric sclera ?ENT:  oropharynx clear ?Neck: supple, nontender ?Lungs:  Clear throughout to auscultation.   No wheezes, crackles, or rhonchi. No acute distress. ?Heart:  Regular rate and rhythm; no murmurs, clicks, rubs,  or gallops. ?Abdomen: upper quadrant tenderness with guarding, soft, nondistended, +BS  ?Rectal:  Deferred ?Ext: no edema ? ?GI:  ?Lab Results: ?Recent Labs  ?  01/04/22 ?1853 01/05/22 ?0335  ?WBC 9.0 7.5  ?HGB 14.9 12.4*  ?HCT 42.7 36.6*  ?PLT 174 138*  ? ?BMET ?Recent Labs  ?  01/04/22 ?1853 01/05/22 ?0335  ?NA 134* 135  ?K 3.6 4.2  ?CL 96* 99  ?CO2 29 28  ?GLUCOSE 143* 119*  ?BUN 37* 29*  ?CREATININE 1.63* 1.42*  ?CALCIUM 9.0 9.1  ? ?LFT ?Recent Labs  ?  01/05/22 ?0335  ?PROT 5.4*  ?ALBUMIN 2.6*  ?AST 13*  ?ALT 8  ?ALKPHOS 31*  ?BILITOT 0.5  ? ?PT/INR ?No results for input(s): LABPROT, INR in the last 72 hours. ? ? ?Studies/Results: ?DG Chest 2 View ? ?Result Date: 01/04/2022 ?CLINICAL DATA:  Chest pain, shortness of breath EXAM: CHEST - 2 VIEW COMPARISON:  04/10/2020 FINDINGS: Lungs are clear.  No pleural effusion or pneumothorax. The heart is normal in size.  Tortuous aorta. Degenerative changes of the visualized thoracolumbar spine. IMPRESSION: Normal chest radiographs. Electronically Signed   By: Charline BillsSriyesh  Krishnan M.D.   On: 01/04/2022 21:31  ? ?CT ABDOMEN PELVIS W CONTRAST ? ?Result Date: 01/04/2022 ?CLINICAL DATA:  Pancreatitis EXAM: CT ABDOMEN AND PELVIS WITH CONTRAST TECHNIQUE: Multidetector CT imaging of the abdomen and pelvis was performed using the standard protocol following bolus administration of intravenous contrast. RADIATION DOSE REDUCTION: This exam was performed according to the departmental dose-optimization program which includes automated exposure control, adjustment of the mA and/or kV according to patient size and/or use of iterative reconstruction technique. CONTRAST:  100mL OMNIPAQUE IOHEXOL 300 MG/ML  SOLN COMPARISON:  05/22/2021 FINDINGS: Lower chest: Unremarkable. Hepatobiliary: Liver  measures 16.5 cm in length. There is 7 mm fluid density lesion in the right lobe with no significant change. There is prominence of intrahepatic bile ducts. Distal common bile duct in the head of the pancreas measures 10.6 mm. Gallbladder is not distended. Small ascites is noted in the porta hepatis and along the anterior margin of the liver. Pancreas: There is marked edema around the pancreas. There is 1.4 cm fluid density lesion in the head of the pancreas which measured 7 mm in diameter in the previous study. There is mild dilation of pancreatic duct. There is interval clearing of 2 cm loculated fluid collection in the tail of the pancreas. There is no demonstrable focal absence of vascular enhancement in the pancreas. There are no new loculated fluid collections in the or around the pancreas. Spleen: Unremarkable. Adrenals/Urinary Tract: Adrenals are unremarkable. There is no hydronephrosis. There are no renal or ureteral stones. There are few low-density lesions in the renal cortex largest measuring 2.5 cm in the  upper pole of right kidney. There are no renal or ureteral stones. Urinary bladder is not distended. There is mild diffuse wall thickening in the bladder which may be partly due to incomplete distention. Possibility of cystitis is not excluded. Stomach/Bowel: Small hiatal hernia is seen. There is mucosal enhancement in the stomach, possibly suggesting nonspecific gastritis. There is no significant small bowel dilation. There is mild wall thickening in the small bowel loops. Appendix is not dilated. Cecum is higher than usual in the position appendix is noted anterior to the duodenum. There is distention of cecum ascending and transverse colon. Scattered diverticula are seen in the left colon without signs of focal acute diverticulitis. Vascular/Lymphatic: There are scattered atherosclerotic plaques and calcifications in the aorta and its major branches. Reproductive: Prostate is enlarged with  inhomogeneous enhancement. Other: There is no pneumoperitoneum. Small ascites is present. There is stranding in the fat planes in the mesentery. Small umbilical hernia containing fat is seen. Right inguinal hernia containin

## 2022-01-05 NOTE — H&P (Signed)
?History and Physical  ? ? ?Patient: Jared Duran CBJ:[id number] DOB: 11-02-1950 ?DOA: 01/04/2022 ?DOS: the patient was seen and examined on 01/05/2022 ?PCP: Drosinis, Leonia Reader, PA-C  ?Patient coming from: Home ? ?Chief Complaint:  ?Chief Complaint  ?Patient presents with  ? Abdominal Pain  ? ?HPI: Jared Duran is a 71 y.o. male with medical history significant of EtOH abuse, HTN, recurrent EtOH pancreatitis (acute and chronic). ? ?Pt last admitted for EtOH pancreatitis in Sept 2022. ? ?Pt follows with Dr. Dulce Sellar for GI it looks like. ? ?Pt states his pain never really fully recovered from prior pancreatitis; however, he does admit to going back to drinking EtOH with most recent drink being "several weeks ago". ? ?For the past 1.5 week he has re-developed severe epigastric abdominal pain.  Pain is identical to prior pancreatitis pain in terms of location and character "exact same spot".  Also associated nausea, 1 episode NBNB emesis earlier today.  No diarrhea. ? ?Pancreatitis flare confirmed in ED, pt transferred from [facility] to [facility] for admission. ?  ?Review of Systems: As mentioned in the history of present illness. All other systems reviewed and are negative. ?Past Medical History:  ?Diagnosis Date  ? Hypertension   ? Pancreatitis   ? ?Past Surgical History:  ?Procedure Laterality Date  ? ESOPHAGOGASTRODUODENOSCOPY (EGD) WITH PROPOFOL N/A 04/10/2021  ? Procedure: ESOPHAGOGASTRODUODENOSCOPY (EGD) WITH PROPOFOL;  Surgeon: Willis Modena, MD;  Location: [facility] ENDOSCOPY;  Service: Endoscopy;  Laterality: N/A;  ? EUS N/A 04/10/2021  ? Procedure: UPPER ENDOSCOPIC ULTRASOUND (EUS) LINEAR;  Surgeon: Willis Modena, MD;  Location: [facility]COPY;  Service: Endoscopy;  Laterality: N/A;  ? ?Social History:  reports that he has been smoking cigarettes. He has been smoking an average of 1 pack per day. He has never used smokeless tobacco. He reports current alcohol use. He reports that he does not use drugs. ? ?No Known  Allergies ? ?History reviewed. No pertinent family history. ? ?Prior to Admission medications   ?Medication Sig Start Date End Date Taking? Authorizing Provider  ?COVID-19 mRNA bivalent vaccine, Moderna, (MODERNA COVID-19 BIVAL BOOSTER) 50 MCG/0.5ML injection Inject into the muscle. 06/25/21   Judyann Munson, MD  ?influenza vaccine adjuvanted (FLUAD) 0.5 ML injection Inject into the muscle. 06/25/21   Judyann Munson, MD  ?losartan (COZAAR) 100 MG tablet Take 1 tablet (100 mg total) by mouth daily. 05/25/21 08/23/21  Darlin Priestly, MD  ?NIFEdipine (PROCARDIA XL/NIFEDICAL XL) 60 MG 24 hr tablet Take 1 tablet (60 mg total) by mouth daily. 05/25/21 08/23/21  Darlin Priestly, MD  ?pantoprazole (PROTONIX) 40 MG tablet Take 40 mg by mouth daily. 11/12/20   [provider]  ?polyethylene glycol (MIRALAX / GLYCOLAX) 17 g packet Take 17 g by mouth daily as needed for mild constipation. 02/12/21   Merlene Laughter, DO  ?Zoster Vaccine Adjuvanted [facility]) injection Inject into the muscle. 06/25/21   Judyann Munson, MD  ? ? ?Physical Exam: ?Vitals:  ? 01/04/22 2200 01/04/22 2227 01/05/22 0130 01/05/22 0200  ?BP: (!) 154/97 (!) 152/95 111/83 136/87  ?Pulse: 87 69  80  ?Resp: 20 20 15 20   ?Temp:    97.6 ?F (36.4 ?C)  ?TempSrc:    Oral  ?SpO2: 99% 99% 99% 100%  ?Weight:    62.1 kg  ?Height:    5\' 9"  (1.753 m)  ? ?Constitutional: NAD, calm, comfortable ?Eyes: PERRL, lids and conjunctivae normal ?ENMT: Mucous membranes are dry. Posterior pharynx clear of any exudate or lesions.Normal dentition.  ?Neck: normal,  supple, no masses, no thyromegaly ?Respiratory: clear to auscultation bilaterally, no wheezing, no crackles. Normal respiratory effort. No accessory muscle use.  ?Cardiovascular: Regular rate and rhythm, no murmurs / rubs / gallops. No extremity edema. 2+ pedal pulses. No carotid bruits.  ?Abdomen: epigastric TTP ?Musculoskeletal: no clubbing / cyanosis. No joint deformity upper and lower extremities. Good ROM, no  contractures. Normal muscle tone.  ?Skin: no rashes, lesions, ulcers. No induration ?Neurologic: CN 2-12 grossly intact. Sensation intact, DTR normal. Strength 5/5 in all 4.  ?Psychiatric: Normal judgment and insight. Alert and oriented x 3. Normal mood.  ? ?Data Reviewed: ? ?  ?CMP  ?   ?Component Value Date/Time  ? NA 134 (L) 01/04/2022 1853  ? K 3.6 01/04/2022 1853  ? CL 96 (L) 01/04/2022 1853  ? CO2 29 01/04/2022 1853  ? GLUCOSE 143 (H) 01/04/2022 1853  ? BUN 37 (H) 01/04/2022 1853  ? CREATININE 1.63 (H) 01/04/2022 1853  ? CALCIUM 9.0 01/04/2022 1853  ? PROT 7.2 01/04/2022 1853  ? ALBUMIN 3.3 (L) 01/04/2022 1853  ? AST 16 01/04/2022 1853  ? ALT 8 01/04/2022 1853  ? ALKPHOS 40 01/04/2022 1853  ? BILITOT 0.4 01/04/2022 1853  ? GFRNONAA 46 (L) 01/04/2022 1853  ? GFRAA >60 04/11/2020 0523  ? ?Lipase 446 ? ?Trop 5 x2 ? ?CT AP = IMPRESSION: ?There is marked edema around the pancreas suggesting acute ?pancreatitis. There is extension of inflammatory stranding into the ?mesentery. Small ascites is present, possibly related to ?pancreatitis. There is no evidence of pancreatic necrosis. ?  ?There is interval clearing of 2 cm loculated fluid collection in the ?tail of pancreas. There is 1.4 cm smooth marginated fluid density ?lesion in the head of the pancreas which appears larger in size. ?This may suggest a small pseudocyst. Less likely possibility would ?be cystic neoplastic process. There is dilation of bile ducts with ?distal common bile duct measuring 10.6 mm. ?  ?There is no hydronephrosis. There few bilateral renal cysts. Small ?hiatal hernia. There is mucosal enhancement in the stomach, possibly ?suggesting gastritis. There is wall thickening in the small bowel ?loops, more so in the jejunum, possibly suggesting nonspecific ?enteritis. ?  ?There is mild diffuse wall thickening in the urinary bladder which ?may be due to incomplete distention or suggest cystitis. Prostate is ?enlarged. ? ?Assessment and Plan: ?*  Alcoholic pancreatitis ?Extensive h/o same. ?New 1.4cm pseudocyst in head of pancreas. ?Previous 2cm pseudocyst in tail of pancreas (from last Sept) has cleared. ?NPO ?IVF ?Morphine PRN pain ?No WBC elevation or fever to indicate need for ABx at this time. ?Pt requesting GI consult regarding drainage of pseudocyst ?I did try to explain to him that current pseudocyst in head of pancreas was new, and that his previous pseudocyst in tail of pancreas he had in sept had resolved. ?Did also try to explain to him that I was fairly confident that GI would want to wait for a number of months first to see if pseudocyst spontaneously resolved (like his last one did) before attempting drainage. ?But at pt request will send a message to GI for their opinion, though I would be surprised if they attempted drainage of the new pseudocyst in the acute setting. ? ?AKI (acute kidney injury) (HCC) ?Likely pre-renal / dehydration due to pancreatitis. ?IVF ?Trend BMP ?Strict intake and output ?Hold losartan ? ?Alcohol abuse ?CIWA ? ? ? ? ? Advance Care Planning:   Code Status: DNR ? ?Consults: Message sent to Dr. Bosie Clos for  GI consult ? ?Family Communication: No family in room ? ?Severity of Illness: ?The appropriate patient status for this patient is OBSERVATION. Observation status is judged to be reasonable and necessary in order to provide the required intensity of service to ensure the patient's safety. The patient's presenting symptoms, physical exam findings, and initial radiographic and laboratory data in the context of their medical condition is felt to place them at decreased risk for further clinical deterioration. Furthermore, it is anticipated that the patient will be medically stable for discharge from the hospital within 2 midnights of admission.  ? ?Author: ?Hillary BowGARDNER, Alissia Lory M., DO ?01/05/2022 3:55 AM ? ?For on call review [email or web address].  ?

## 2022-01-05 NOTE — Assessment & Plan Note (Addendum)
Extensive h/o same. ?New 1.4cm pseudocyst in head of pancreas. ?Previous 2cm pseudocyst in tail of pancreas (from last Sept) has cleared. ?1. NPO ?2. IVF ?3. Morphine PRN pain ?4. No WBC elevation or fever to indicate need for ABx at this time. ?5. Pt requesting GI consult regarding drainage of pseudocyst ?1. I did try to explain to him that current pseudocyst in head of pancreas was new, and that his previous pseudocyst in tail of pancreas he had in sept had resolved. ?2. Did also try to explain to him that I was fairly confident that GI would want to wait for a number of months first to see if pseudocyst spontaneously resolved (like his last one did) before attempting drainage. ?3. But at pt request will send a message to GI for their opinion, though I would be surprised if they attempted drainage of the new pseudocyst in the acute setting. ?

## 2022-01-05 NOTE — Progress Notes (Signed)
01/05/2022 ?0230 ?Received pt to room 4E-21 from [facility] via [facility] who is admitted with Pancreatitis.  Pt is A&Ox4-Some C/O abdominal pain.  Tele monitor applied and CCMD notified.  CHG bath given.  Oriented to room, call light and bed.  Call bell in reach. ?Debara Pickett C ? ?

## 2022-01-05 NOTE — Assessment & Plan Note (Addendum)
Likely pre-renal / dehydration due to pancreatitis. ?1. IVF ?2. Trend BMP ?3. Strict intake and output ?4. Hold losartan ?

## 2022-01-05 NOTE — Progress Notes (Signed)
Patient seen and examined.  Complains of mild epigastric pain.  He was wondering about some liquids, however he had to take IV morphine for pain relief in the afternoon.  Patient tells me that he had not been drinking any alcohol for last 2 weeks and never had any withdrawal symptoms.  Patient tells me that he is more or less ready to quit drinking to prevent from abdominal pain.  He tried to stay home for 2 weeks but would not get any relief so came to the hospital.  Admitted early morning hours by nighttime hospitalist with epigastric pain, found to have alcoholic pancreatitis.  CT scan with no evidence of gallbladder disease.  There is a small pancreatic cyst. ? ?Plan: Continue n.p.o. except ice chips and sips of water.  Continue IV fluids.  Adequate IV opiates for pain relief.  Recheck LFTs, renal function test, lipase in the morning. ?So far stable ?Evidence of alcohol withdrawal.  Keep on CIWA ? ?Total time spent: 30 minutes.  Same-day admit.  No charge visit ? ?

## 2022-01-06 DIAGNOSIS — K852 Alcohol induced acute pancreatitis without necrosis or infection: Secondary | ICD-10-CM | POA: Diagnosis not present

## 2022-01-06 LAB — COMPREHENSIVE METABOLIC PANEL
ALT: 8 U/L (ref 0–44)
AST: 13 U/L — ABNORMAL LOW (ref 15–41)
Albumin: 2.3 g/dL — ABNORMAL LOW (ref 3.5–5.0)
Alkaline Phosphatase: 28 U/L — ABNORMAL LOW (ref 38–126)
Anion gap: 7 (ref 5–15)
BUN: 16 mg/dL (ref 8–23)
CO2: 29 mmol/L (ref 22–32)
Calcium: 8.5 mg/dL — ABNORMAL LOW (ref 8.9–10.3)
Chloride: 100 mmol/L (ref 98–111)
Creatinine, Ser: 1.08 mg/dL (ref 0.61–1.24)
GFR, Estimated: >60 mL/min (ref >60)
Glucose, Bld: 84 mg/dL (ref 70–99)
Potassium: 4 mmol/L (ref 3.5–5.1)
Sodium: 136 mmol/L (ref 135–145)
Total Bilirubin: 0.7 mg/dL (ref 0.3–1.2)
Total Protein: 5 g/dL — ABNORMAL LOW (ref 6.5–8.1)

## 2022-01-06 LAB — PHOSPHORUS: Phosphorus: 3.6 mg/dL (ref 2.5–4.6)

## 2022-01-06 LAB — MAGNESIUM: Magnesium: 1.5 mg/dL — ABNORMAL LOW (ref 1.7–2.4)

## 2022-01-06 LAB — LIPASE, BLOOD: Lipase: 91 U/L — ABNORMAL HIGH (ref 11–51)

## 2022-01-06 MED ORDER — ALUM & MAG HYDROXIDE-SIMETH 200-200-20 MG/5ML PO SUSP
30.0000 mL | ORAL | Status: DC | PRN
  Administered 2022-01-06 – 2022-01-07 (×2): 30 mL via ORAL
  Filled 2022-01-06 (×2): qty 30

## 2022-01-06 MED ORDER — MAGNESIUM SULFATE 2 GM/50ML IV SOLN
2.0000 g | Freq: Once | INTRAVENOUS | Status: AC
  Administered 2022-01-06: 2 g via INTRAVENOUS
  Filled 2022-01-06: qty 50

## 2022-01-06 MED ORDER — ENSURE ENLIVE PO LIQD
237.0000 mL | Freq: Two times a day (BID) | ORAL | Status: DC
  Administered 2022-01-06 – 2022-01-07 (×2): 237 mL via ORAL

## 2022-01-06 MED ORDER — OXYCODONE HCL 5 MG PO TABS: 5.0000 mg | ORAL_TABLET | Freq: Four times a day (QID) | ORAL | Status: DC | PRN

## 2022-01-06 NOTE — Progress Notes (Signed)
Initial Nutrition Assessment ? ?DOCUMENTATION CODES:  ? ?Severe malnutrition in context of acute illness/injury ? ?INTERVENTION:  ?Provide Ensure Enlive po BID, each supplement provides 350 kcal and 20 grams of protein. ? ?Monitor magnesium, potassium, and phosphorus BID for at least 3 days, MD to replete as needed, as pt is at risk for refeeding syndrome given malnutrition, prolonged poor po intake. ? ?NUTRITION DIAGNOSIS:  ? ?Severe Malnutrition related to acute illness (pancreatitis) as evidenced by energy intake < or equal to 50% for > or equal to 5 days, severe muscle depletion, moderate fat depletion. ? ?GOAL:  ? ?Patient will meet greater than or equal to 90% of their needs ? ?MONITOR:  ? ?PO intake, Supplement acceptance, Labs, Weight trends, Skin, I & O's ? ?REASON FOR ASSESSMENT:  ? ?Malnutrition Screening Tool ?  ? ?ASSESSMENT:  ? ?71 y.o. male with medical history significant of EtOH abuse, HTN, recurrent EtOH pancreatitis presents with 2 weeks of abdominal pain. Pt with Acute alcoholic pancreatitis, acute on chronic pancreatitis. ? ?Diet has been advanced to a heart diet. Pt reports he has been tolerating his po well. Abdominal pains have been improving. Meal completion 100% at breakfast this morning while on a clear liquid diet. Pt reports poor po intake for 2 weeks PTA. He reports he has only been able to tolerate small amounts of "junk" food, such as potato chips, during that time. Pt reports usual body weight of ~140 lbs which he reported weighing prior to the past 2 weeks. Pt with a 2% weight loss within 2 weeks time frame. RD to order nutritional supplements to aid in caloric and protein needs.  ? ?NUTRITION - FOCUSED PHYSICAL EXAM: ? ?Flowsheet Row Most Recent Value  ?Orbital Region Moderate depletion  ?Upper Arm Region Moderate depletion  ?Thoracic and Lumbar Region Moderate depletion  ?Buccal Region Moderate depletion  ?Temple Region Moderate depletion  ?Clavicle Bone Region Severe depletion   ?Clavicle and Acromion Bone Region Severe depletion  ?Scapular Bone Region Unable to assess  ?Dorsal Hand Unable to assess  ?Patellar Region Moderate depletion  ?Anterior Thigh Region Moderate depletion  ?Posterior Calf Region Moderate depletion  ?Edema (RD Assessment) None  ?Hair Reviewed  ?Eyes Reviewed  ?Mouth Reviewed  ?Skin Reviewed  ?Nails Reviewed  ? ?  ? ?Labs and medications reviewed. Magnesium low at 1.5, repleted with magnesium sulfate. Lipase 91. ? ?Diet Order:   ?Diet Order   ? ?       ?  Diet Heart Room service appropriate? Yes; Fluid consistency: Thin  Diet effective now       ?  ? ?  ?  ? ?  ? ? ?EDUCATION NEEDS:  ? ?Not appropriate for education at this time ? ?Skin:  Skin Assessment: Reviewed RN Assessment ? ?Last BM:  Unknown ? ?Height:  ? ?Ht Readings from Last 1 Encounters:  ?01/05/22 5\' 9"  (1.753 m)  ? ? ?Weight:  ? ?Wt Readings from Last 1 Encounters:  ?01/05/22 62.1 kg  ? ?BMI:  Body mass index is 20.23 kg/m?. ? ?Estimated Nutritional Needs:  ? ?Kcal:  2000-2200 ? ?Protein:  100-110 grams ? ?Fluid:  >/= 2 L/day ? ?01/07/22, MS, RD, LDN ?RD pager number/after hours weekend pager number on Am[employer]. ? ?

## 2022-01-06 NOTE — Progress Notes (Signed)
?PROGRESS NOTE ? ? ? ?Jared Duran  CLE:[id number] DOB: 1951-06-02 DOA: 01/04/2022 ?PCP: Caffie Damme, MD  ? ? ?Brief Narrative:  ?Patient with history of previous pancreatitis admitted with 2 weeks of epigastric pain and found to have acute uncomplicated pancreatitis. ? ? ?Assessment & Plan: ?  ?Acute alcoholic pancreatitis, acute on chronic pancreatitis.  2 cm pancreatic pseudocyst: ?Some improvement of pain today. ?Challenge with clears, will advance to low-fat diet by dinner if not needing any additional pain medications.  Continue IV fluids.  Electrolytes are adequate. ?No evidence of biliary disease on CT scan of the abdomen. ?Complete alcohol abstinence advised. ?GI will schedule follow-up outpatient with [facility]. ? ?Alcoholism: Complete abstinence advised.  Patient not drinking for last 2 weeks.  Without any evidence of alcohol withdrawal.  Remains on multivitamins. ? ?Hypomagnesemia: Replace with IV magnesium today. ? ? ?DVT prophylaxis: enoxaparin (LOVENOX) injection 40 mg Start: 01/05/22 1000 ? ? ?Code Status: Full code ?Family Communication: None ?Disposition Plan: Status is: Inpatient ?Remains inpatient appropriate because: Oral intake challenge today.  On IV fluid. ?  ? ? ?Consultants:  ?GI ? ?Procedures:  ?None ? ?Antimicrobials:  ?None ? ? ?Subjective: ?Patient seen and examined.  Denies any nausea or vomiting.  Feels hungry.  He had mild epigastric pain last night and used 1 dose of morphine.  Passing flatus.  No bowel movements yet. ? ?Objective: ?Vitals:  ? 01/05/22 2005 01/06/22 0002 01/06/22 0419 01/06/22 0813  ?BP: 125/90 (!) 143/89  (!) 133/92  ?Pulse: 72 73 77 73  ?Resp: 20 17 16 16   ?Temp: 99.2 ?F (37.3 ?C) 98.5 ?F (36.9 ?C) 98.2 ?F (36.8 ?C) 98 ?F (36.7 ?C)  ?TempSrc: Oral Oral Oral Oral  ?SpO2: 100% 100% 100% 100%  ?Weight:      ?Height:      ? ? ?Intake/Output Summary (Last 24 hours) at 01/06/2022 1031 ?Last data filed at 01/06/2022 0359 ?Gross per 24 hour  ?Intake 2945.92 ml  ?Output  1575 ml  ?Net 1370.92 ml  ? ?Filed Weights  ? 01/04/22 1838 01/05/22 0200  ?Weight: 63.5 kg 62.1 kg  ? ? ?Examination: ? ?General exam: Appears calm and comfortable.  Not in any distress. ?Respiratory system: Clear to auscultation. Respiratory effort normal. ?Cardiovascular system: S1 & S2 heard, RRR.  ?Gastrointestinal system: Soft.  Mild tenderness at the epigastrium.  No rigidity or guarding.  Bowel sound present. ?Central nervous system: Alert and oriented. No focal neurological deficits. ?Extremities: Symmetric 5 x 5 power. ?Skin: No rashes, lesions or ulcers ?Psychiatry: Judgement and insight appear normal. Mood & affect appropriate.  ? ? ? ?Data Reviewed: I have personally reviewed following labs and imaging studies ? ?CBC: ?Recent Labs  ?Lab 01/04/22 ?1853 01/05/22 ?0335  ?WBC 9.0 7.5  ?HGB 14.9 12.4*  ?HCT 42.7 36.6*  ?MCV 103.4* 104.9*  ?PLT 174 138*  ? ?Basic Metabolic Panel: ?Recent Labs  ?Lab 01/04/22 ?1853 01/05/22 ?01/07/22 01/06/22 ?03/08/22  ?NA 134* 135 136  ?K 3.6 4.2 4.0  ?CL 96* 99 100  ?CO2 29 28 29   ?GLUCOSE 143* 119* 84  ?BUN 37* 29* 16  ?CREATININE 1.63* 1.42* 1.08  ?CALCIUM 9.0 9.1 8.5*  ?MG  --   --  1.5*  ?PHOS  --   --  3.6  ? ?GFR: ?Estimated Creatinine Clearance: 57.5 mL/min (by C-G formula based on SCr of 1.08 mg/dL). ?Liver Function Tests: ?Recent Labs  ?Lab 01/04/22 ?1853 01/05/22 ?01/06/22 01/06/22 ?1638  ?AST 16 13* 13*  ?ALT 8  8 8  ?ALKPHOS 40 31* 28*  ?BILITOT 0.4 0.5 0.7  ?PROT 7.2 5.4* 5.0*  ?ALBUMIN 3.3* 2.6* 2.3*  ? ?Recent Labs  ?Lab 01/04/22 ?1853 01/05/22 ?[id number] 01/06/22 ?[id number]  ?LIPASE 446* 393* 91*  ? ?No results for input(s): AMMONIA in the last 168 hours. ?Coagulation Profile: ?No results for input(s): INR, PROTIME in the last 168 hours. ?Cardiac Enzymes: ?No results for input(s): CKTOTAL, CKMB, CKMBINDEX, TROPONINI in the last 168 hours. ?BNP (last 3 results) ?No results for input(s): PROBNP in the last 8760 hours. ?HbA1C: ?No results for input(s): HGBA1C in the last 72  hours. ?CBG: ?No results for input(s): GLUCAP in the last 168 hours. ?Lipid Profile: ?No results for input(s): CHOL, HDL, LDLCALC, TRIG, CHOLHDL, LDLDIRECT in the last 72 hours. ?Thyroid Function Tests: ?No results for input(s): TSH, T4TOTAL, FREET4, T3FREE, THYROIDAB in the last 72 hours. ?Anemia Panel: ?No results for input(s): VITAMINB12, FOLATE, FERRITIN, TIBC, IRON, RETICCTPCT in the last 72 hours. ?Sepsis Labs: ?No results for input(s): PROCALCITON, LATICACIDVEN in the last 168 hours. ? ?No results found for this or any previous visit (from the past 240 hour(s)).  ? ? ? ? ? ?Radiology Studies: ?DG Chest 2 View ? ?Result Date: 01/04/2022 ?CLINICAL DATA:  Chest pain, shortness of breath EXAM: CHEST - 2 VIEW COMPARISON:  04/10/2020 FINDINGS: Lungs are clear.  No pleural effusion or pneumothorax. The heart is normal in size.  Tortuous aorta. Degenerative changes of the visualized thoracolumbar spine. IMPRESSION: Normal chest radiographs. Electronically Signed   By: Charline BillsSriyesh  Krishnan M.D.   On: 01/04/2022 21:31  ? ?CT ABDOMEN PELVIS W CONTRAST ? ?Result Date: 01/04/2022 ?CLINICAL DATA:  Pancreatitis EXAM: CT ABDOMEN AND PELVIS WITH CONTRAST TECHNIQUE: Multidetector CT imaging of the abdomen and pelvis was performed using the standard protocol following bolus administration of intravenous contrast. RADIATION DOSE REDUCTION: This exam was performed according to the departmental dose-optimization program which includes automated exposure control, adjustment of the mA and/or kV according to patient size and/or use of iterative reconstruction technique. CONTRAST:  100mL OMNIPAQUE IOHEXOL 300 MG/ML  SOLN COMPARISON:  05/22/2021 FINDINGS: Lower chest: Unremarkable. Hepatobiliary: Liver measures 16.5 cm in length. There is 7 mm fluid density lesion in the right lobe with no significant change. There is prominence of intrahepatic bile ducts. Distal common bile duct in the head of the pancreas measures 10.6 mm. Gallbladder is  not distended. Small ascites is noted in the porta hepatis and along the anterior margin of the liver. Pancreas: There is marked edema around the pancreas. There is 1.4 cm fluid density lesion in the head of the pancreas which measured 7 mm in diameter in the previous study. There is mild dilation of pancreatic duct. There is interval clearing of 2 cm loculated fluid collection in the tail of the pancreas. There is no demonstrable focal absence of vascular enhancement in the pancreas. There are no new loculated fluid collections in the or around the pancreas. Spleen: Unremarkable. Adrenals/Urinary Tract: Adrenals are unremarkable. There is no hydronephrosis. There are no renal or ureteral stones. There are few low-density lesions in the renal cortex largest measuring 2.5 cm in the upper pole of right kidney. There are no renal or ureteral stones. Urinary bladder is not distended. There is mild diffuse wall thickening in the bladder which may be partly due to incomplete distention. Possibility of cystitis is not excluded. Stomach/Bowel: Small hiatal hernia is seen. There is mucosal enhancement in the stomach, possibly suggesting nonspecific gastritis. There is no significant  small bowel dilation. There is mild wall thickening in the small bowel loops. Appendix is not dilated. Cecum is higher than usual in the position appendix is noted anterior to the duodenum. There is distention of cecum ascending and transverse colon. Scattered diverticula are seen in the left colon without signs of focal acute diverticulitis. Vascular/Lymphatic: There are scattered atherosclerotic plaques and calcifications in the aorta and its major branches. Reproductive: Prostate is enlarged with inhomogeneous enhancement. Other: There is no pneumoperitoneum. Small ascites is present. There is stranding in the fat planes in the mesentery. Small umbilical hernia containing fat is seen. Right inguinal hernia containing fat and ascitic fluid is  seen. Musculoskeletal: Unremarkable. IMPRESSION: There is marked edema around the pancreas suggesting acute pancreatitis. There is extension of inflammatory stranding into the mesentery. Small ascites is present, p

## 2022-01-06 NOTE — Progress Notes (Signed)
[facility] Progress Note ? ?Jared Duran 71 y.o. 1951/03/24 ? ? ?Subjective: ?Feels better. Denies abdominal pain, N/V. No BMs. ? ?Objective: ?Vital signs: ?Vitals:  ? 01/06/22 0419 01/06/22 0813  ?BP:  (!) 133/92  ?Pulse: 77 73  ?Resp: 16 16  ?Temp: 98.2 ?F (36.8 ?C) 98 ?F (36.7 ?C)  ?SpO2: 100% 100%  ? ? ?Physical Exam: ?Gen: lethargic, chronically ill-appearing, no acute distress, thin ?HEENT: anicteric sclera ?CV: RRR ?Chest: CTA B ?Abd: diffuse tenderness (greatest in upper quadrant) with guarding, soft, +BS ?Ext: no edema ? ?Lab Results: ?Recent Labs  ?  01/05/22 ?8676 01/06/22 ?7209  ?NA 135 136  ?K 4.2 4.0  ?CL 99 100  ?CO2 28 29  ?GLUCOSE 119* 84  ?BUN 29* 16  ?CREATININE 1.42* 1.08  ?CALCIUM 9.1 8.5*  ?MG  --  1.5*  ?PHOS  --  3.6  ? ?Recent Labs  ?  01/05/22 ?4709 01/06/22 ?6283  ?AST 13* 13*  ?ALT 8 8  ?ALKPHOS 31* 28*  ?BILITOT 0.5 0.7  ?PROT 5.4* 5.0*  ?ALBUMIN 2.6* 2.3*  ? ?Recent Labs  ?  01/04/22 ?1853 01/05/22 ?0335  ?WBC 9.0 7.5  ?HGB 14.9 12.4*  ?HCT 42.7 36.6*  ?MCV 103.4* 104.9*  ?PLT 174 138*  ? ? ? ? ?Assessment/Plan: ?Acute on chronic pancreatitis - clinically improving. Lipase 91. Clear liquid diet. Advance as tolerated to low fat diet. If tolerates diet should be ok to go home tomorrow. Needs to stop all alcohol. Repeat CT in 2 months. F/U with [facility] in 6-8 weeks. Will sign off. Call if questions. ? ? ?Shirley Friar ?01/06/2022, 9:36 AM ? ?Questions please call [phone] Patient ID: Jared Duran, male   DOB: Apr 20, 1951, 71 y.o.   MRN: [id number] ? ?

## 2022-01-06 NOTE — Plan of Care (Signed)

## 2022-01-07 DIAGNOSIS — E43 Unspecified severe protein-calorie malnutrition: Secondary | ICD-10-CM | POA: Insufficient documentation

## 2022-01-07 DIAGNOSIS — K852 Alcohol induced acute pancreatitis without necrosis or infection: Secondary | ICD-10-CM | POA: Diagnosis not present

## 2022-01-07 LAB — COMPREHENSIVE METABOLIC PANEL
ALT: 6 U/L (ref 0–44)
AST: 12 U/L — ABNORMAL LOW (ref 15–41)
Albumin: 2.4 g/dL — ABNORMAL LOW (ref 3.5–5.0)
Alkaline Phosphatase: 28 U/L — ABNORMAL LOW (ref 38–126)
Anion gap: 9 (ref 5–15)
BUN: 10 mg/dL (ref 8–23)
CO2: 28 mmol/L (ref 22–32)
Calcium: 8.4 mg/dL — ABNORMAL LOW (ref 8.9–10.3)
Chloride: 97 mmol/L — ABNORMAL LOW (ref 98–111)
Creatinine, Ser: 0.93 mg/dL (ref 0.61–1.24)
GFR, Estimated: >60 mL/min (ref >60)
Glucose, Bld: 104 mg/dL — ABNORMAL HIGH (ref 70–99)
Potassium: 3.7 mmol/L (ref 3.5–5.1)
Sodium: 134 mmol/L — ABNORMAL LOW (ref 135–145)
Total Bilirubin: 0.7 mg/dL (ref 0.3–1.2)
Total Protein: 5.3 g/dL — ABNORMAL LOW (ref 6.5–8.1)

## 2022-01-07 MED ORDER — PANTOPRAZOLE SODIUM 40 MG PO TBEC: 40.0000 mg | DELAYED_RELEASE_TABLET | Freq: Two times a day (BID) | ORAL | 11 refills | Status: DC

## 2022-01-07 MED ORDER — ALUM & MAG HYDROXIDE-SIMETH 200-200-20 MG/5ML PO SUSP: 30.0000 mL | ORAL | 0 refills | Status: DC | PRN

## 2022-01-07 MED ORDER — ADULT MULTIVITAMIN W/MINERALS CH: 1.0000 | ORAL_TABLET | Freq: Every day | ORAL | 0 refills | Status: AC

## 2022-01-07 NOTE — Discharge Summary (Signed)
Physician Discharge Summary  ?Jared Duran [id number] DOB: 1951-02-03 DOA: 01/04/2022 ? ?PCP: [name], MD ? ?Admit date: 01/04/2022 ?Discharge date: 01/07/2022 ? ?Admitted From: Home ?Disposition: Home ? ?Recommendations for Outpatient Follow-up:  ?Follow up with PCP in 1-2 weeks ?Please obtain BMP/CBC in one week ?[facility] office will schedule follow-up. ? ?Home Health: N/A ?Equipment/Devices: N/A ? ?Discharge Condition: Stable ?CODE STATUS: Full code ?Diet recommendation: Low-salt diet.  Complete abstinence from alcohol. ? ?Discharge summary: ? ?71 year old gentleman with history of previous alcoholic pancreatitis, history of hypertension presented with 2 weeks of ongoing epigastric pain and worse for last few days and found to have acute uncomplicated pancreatitis with normal biliary tree.  Also found to have 2 cm pseudocyst.  Admitted to hospital and treated with IV fluids IV and oral opiates with complete improvement of symptoms.  Now with normal bowel function.  Eating regular diet.  Not used any pain control for last 24 hours.  Also seen by gastroenterology.  Able to go home today, resume antihypertensives.  Complete abstinence from alcohol.  Electrolytes were replaced before discharge.  GI office will schedule follow-up. ?Clinical symptomatology consistent with reflux esophagitis, will benefit with high-dose PPI.  Will prescribe Protonix 40 mg twice daily on discharge.  He will take antacids along with it. ?Acute kidney injury, improved and normalized. ?Hypomagnesemia, replaced IV before discharge. ? ? ?Discharge Diagnoses:  ?Principal Problem: ?  Alcoholic pancreatitis ?Active Problems: ?  Acute alcoholic pancreatitis ?  Alcohol abuse ?  AKI (acute kidney injury) ([place]) ?  Pancreatitis, acute ?  Protein-calorie malnutrition, severe ? ? ? ?Discharge Instructions ? ?Discharge Instructions   ? ? Call MD for:  persistant nausea and vomiting   Complete by: As directed ?  ? Call MD for:  severe  uncontrolled pain   Complete by: As directed ?  ? Diet - low sodium heart healthy   Complete by: As directed ?  ? Complete abstinence from alcohol ?Soft food and liquids ?Can use tylenol for pain  ? Increase activity slowly   Complete by: As directed ?  ? ?  ? ?Allergies as of 01/07/2022   ?No Known Allergies ?  ? ?  ?Medication List  ?  ? ?STOP taking these medications   ? ?Fluad Quadrivalent 0.5 ML injection ?Generic drug: influenza vaccine adjuvanted ?  ?losartan 100 MG tablet ?Commonly known as: COZAAR ?  ?Moderna COVID-19 Bival Booster 50 MCG/0.5ML injection ?Generic drug: COVID-19 mRNA bivalent vaccine (Moderna) ?  ?Shingrix injection ?Generic drug: Zoster Vaccine Adjuvanted ?  ? ?  ? ?TAKE these medications   ? ?alum & mag hydroxide-simeth 200-200-20 MG/5ML suspension ?Commonly known as: MAALOX/MYLANTA ?Take 30 mLs by mouth every 4 (four) hours as needed for indigestion or heartburn. ?  ?calcium carbonate 750 MG chewable tablet ?Commonly known as: TUMS EX ?Chew 1,500 mg by mouth daily as needed for heartburn. ?  ?multivitamin with minerals Tabs tablet ?Take 1 tablet by mouth daily. ?Start taking on: Jan 08, 2022 ?  ?NIFEdipine 90 MG 24 hr tablet ?Commonly known as: ADALAT CC ?Take 90 mg by mouth daily. ?  ?pantoprazole 40 MG tablet ?Commonly known as: Protonix ?Take 1 tablet (40 mg total) by mouth 2 (two) times daily. ?  ? ?  ? ? ?No Known Allergies ? ?Consultations: ?Gastroenterology ? ? ?Procedures/Studies: ?DG Chest 2 View ? ?Result Date: 01/04/2022 ?CLINICAL DATA:  Chest pain, shortness of breath EXAM: CHEST - 2 VIEW COMPARISON:  04/10/2020 FINDINGS: Lungs are clear.  No pleural effusion or pneumothorax. The heart is normal in size.  Tortuous aorta. Degenerative changes of the visualized thoracolumbar spine. IMPRESSION: Normal chest radiographs. Electronically Signed   By: Julian Hy M.D.   On: 01/04/2022 21:31  ? ?CT ABDOMEN PELVIS W CONTRAST ? ?Result Date: 01/04/2022 ?CLINICAL DATA:  Pancreatitis  EXAM: CT ABDOMEN AND PELVIS WITH CONTRAST TECHNIQUE: Multidetector CT imaging of the abdomen and pelvis was performed using the standard protocol following bolus administration of intravenous contrast. RADIATION DOSE REDUCTION: This exam was performed according to the departmental dose-optimization program which includes automated exposure control, adjustment of the mA and/or kV according to patient size and/or use of iterative reconstruction technique. CONTRAST:  119mL OMNIPAQUE IOHEXOL 300 MG/ML  SOLN COMPARISON:  05/22/2021 FINDINGS: Lower chest: Unremarkable. Hepatobiliary: Liver measures 16.5 cm in length. There is 7 mm fluid density lesion in the right lobe with no significant change. There is prominence of intrahepatic bile ducts. Distal common bile duct in the head of the pancreas measures 10.6 mm. Gallbladder is not distended. Small ascites is noted in the porta hepatis and along the anterior margin of the liver. Pancreas: There is marked edema around the pancreas. There is 1.4 cm fluid density lesion in the head of the pancreas which measured 7 mm in diameter in the previous study. There is mild dilation of pancreatic duct. There is interval clearing of 2 cm loculated fluid collection in the tail of the pancreas. There is no demonstrable focal absence of vascular enhancement in the pancreas. There are no new loculated fluid collections in the or around the pancreas. Spleen: Unremarkable. Adrenals/Urinary Tract: Adrenals are unremarkable. There is no hydronephrosis. There are no renal or ureteral stones. There are few low-density lesions in the renal cortex largest measuring 2.5 cm in the upper pole of right kidney. There are no renal or ureteral stones. Urinary bladder is not distended. There is mild diffuse wall thickening in the bladder which may be partly due to incomplete distention. Possibility of cystitis is not excluded. Stomach/Bowel: Small hiatal hernia is seen. There is mucosal enhancement in the  stomach, possibly suggesting nonspecific gastritis. There is no significant small bowel dilation. There is mild wall thickening in the small bowel loops. Appendix is not dilated. Cecum is higher than usual in the position appendix is noted anterior to the duodenum. There is distention of cecum ascending and transverse colon. Scattered diverticula are seen in the left colon without signs of focal acute diverticulitis. Vascular/Lymphatic: There are scattered atherosclerotic plaques and calcifications in the aorta and its major branches. Reproductive: Prostate is enlarged with inhomogeneous enhancement. Other: There is no pneumoperitoneum. Small ascites is present. There is stranding in the fat planes in the mesentery. Small umbilical hernia containing fat is seen. Right inguinal hernia containing fat and ascitic fluid is seen. Musculoskeletal: Unremarkable. IMPRESSION: There is marked edema around the pancreas suggesting acute pancreatitis. There is extension of inflammatory stranding into the mesentery. Small ascites is present, possibly related to pancreatitis. There is no evidence of pancreatic necrosis. There is interval clearing of 2 cm loculated fluid collection in the tail of pancreas. There is 1.4 cm smooth marginated fluid density lesion in the head of the pancreas which appears larger in size. This may suggest a small pseudocyst. Less likely possibility would be cystic neoplastic process. There is dilation of bile ducts with distal common bile duct measuring 10.6 mm. There is no hydronephrosis. There few bilateral renal cysts. Small hiatal hernia. There is mucosal enhancement in the  stomach, possibly suggesting gastritis. There is wall thickening in the small bowel loops, more so in the jejunum, possibly suggesting nonspecific enteritis. There is mild diffuse wall thickening in the urinary bladder which may be due to incomplete distention or suggest cystitis. Prostate is enlarged. Other findings as described  in the body of the report. Electronically Signed   By: Elmer Picker M.D.   On: 01/04/2022 21:57   ?(Echo, Carotid, EGD, Colonoscopy, ERCP)  ? ? ?Subjective: Patient seen and examined.  He does have some re

## 2022-01-07 NOTE — Social Work (Signed)
CSW received consult for substance abuse/ counseling/education- CSW met with patient at bedside. CSW introduced self and reason for consult. Patient declined SA resources and states " it's not like I drink every day". Patient did not appear happy that CSW was consulted for SA resources, " he is making a big deal out of nothing and I told him this." However, he did confirm "this has happen before" and he somewhat understands why CSW was consulted. ? ?Patient states no resources needed and no questions for concerns. Clinical Social Worker will sign off for now as social work intervention is no longer needed. Please consult [place] if new need arises.  ? ? ?Thurmond Butts, LCSWA ?Clinical Social Worker ?[phone]  ?

## 2022-01-07 NOTE — Progress Notes (Signed)
Explained discharge instructions, reviewed follow up appointments and made aware of next scheduled medication administration times. Patient verbalized having an understanding. All belongings are in the patient's possession. IV and telemetry monitor were removed. CCMD was notified.  ?

## 2022-01-07 NOTE — Plan of Care (Signed)

## 2022-01-07 NOTE — TOC Transition Note (Signed)
Transition of Care (TOC) - CM/SW Discharge Note ?Donn Pierini Charity fundraiser, BSN ?Transitions of Care ?Unit 4E- RN Case Manager ?See Treatment Team for direct phone #  ? ? ?Patient Details  ?Name: Jared Duran ?MRN: [id number] ?Date of Birth: 1951/03/21 ? ?Transition of Care (TOC) CM/SW Contact:  ?Zenda Alpers, Lenn Sink, RN ?Phone Number: ?01/07/2022, 9:56 AM ? ? ?Clinical Narrative:    ?Pt stable for transition home today, CSW has spoken with pt for SA consult. Transition of Care Department [facility] has reviewed patient and no further TOC needs have been identified at this time.  ? ? ?Final next level of care: Home/Self Care ?Barriers to Discharge: No Barriers Identified ? ? ?Patient Goals and CMS Choice ?  ?  ?Choice offered to / list presented to : NA ? ?Discharge Placement ?  ?           ? Home ?  ?  ?  ? ?Discharge Plan and Services ?In-house Referral: Clinical Social Work ?Discharge Planning Services: NA ?Post Acute Care Choice: NA          ?DME Arranged: N/A ?DME Agency: NA ?  ?  ?  ?HH Arranged: NA ?HH Agency: NA ?  ?  ?  ? ?Social Determinants of Health (SDOH) Interventions ?  ? ? ?Readmission Risk Interventions ? ?  01/07/2022  ?  9:55 AM  ?Readmission Risk Prevention Plan  ?Transportation Screening Complete  ?Home Care Screening Complete  ?Medication Review (RN CM) Complete  ? ? ? ? ? ?

## 2022-01-07 NOTE — Plan of Care (Signed)
  Problem: Education: Goal: Knowledge of General Education information will improve Description: Including pain rating scale, medication(s)/side effects and non-pharmacologic comfort measures Outcome: Adequate for Discharge   Problem: Health Behavior/Discharge Planning: Goal: Ability to manage health-related needs will improve Outcome: Adequate for Discharge   Problem: Clinical Measurements: Goal: Ability to maintain clinical measurements within normal limits will improve Outcome: Adequate for Discharge Goal: Will remain free from infection Outcome: Adequate for Discharge Goal: Diagnostic test results will improve Outcome: Adequate for Discharge Goal: Respiratory complications will improve Outcome: Adequate for Discharge Goal: Cardiovascular complication will be avoided Outcome: Adequate for Discharge   Problem: Pain Managment: Goal: General experience of comfort will improve Outcome: Adequate for Discharge   Problem: Safety: Goal: Ability to remain free from injury will improve Outcome: Adequate for Discharge   

## 2022-10-19 ENCOUNTER — Emergency Department (HOSPITAL_BASED_OUTPATIENT_CLINIC_OR_DEPARTMENT_OTHER): Payer: Medicare PPO

## 2022-10-19 ENCOUNTER — Encounter (HOSPITAL_BASED_OUTPATIENT_CLINIC_OR_DEPARTMENT_OTHER): Payer: Self-pay | Admitting: Emergency Medicine

## 2022-10-19 ENCOUNTER — Inpatient Hospital Stay (HOSPITAL_BASED_OUTPATIENT_CLINIC_OR_DEPARTMENT_OTHER)
Admission: EM | Admit: 2022-10-19 | Discharge: 2022-10-23 | DRG: 438 | Disposition: A | Discharge status: Home / Self Care | Payer: Medicare PPO | Attending: Internal Medicine | Admitting: Internal Medicine

## 2022-10-19 ENCOUNTER — Other Ambulatory Visit: Payer: Self-pay

## 2022-10-19 DIAGNOSIS — Z635 Disruption of family by separation and divorce: Secondary | ICD-10-CM | POA: Diagnosis not present

## 2022-10-19 DIAGNOSIS — F1011 Alcohol abuse, in remission: Secondary | ICD-10-CM

## 2022-10-19 DIAGNOSIS — I10 Essential (primary) hypertension: Secondary | ICD-10-CM | POA: Diagnosis present

## 2022-10-19 DIAGNOSIS — K851 Biliary acute pancreatitis without necrosis or infection: Secondary | ICD-10-CM

## 2022-10-19 DIAGNOSIS — K831 Obstruction of bile duct: Secondary | ICD-10-CM | POA: Diagnosis present

## 2022-10-19 DIAGNOSIS — K821 Hydrops of gallbladder: Secondary | ICD-10-CM | POA: Diagnosis present

## 2022-10-19 DIAGNOSIS — K859 Acute pancreatitis without necrosis or infection, unspecified: Secondary | ICD-10-CM | POA: Diagnosis present

## 2022-10-19 DIAGNOSIS — Z79899 Other long term (current) drug therapy: Secondary | ICD-10-CM | POA: Diagnosis not present

## 2022-10-19 DIAGNOSIS — K861 Other chronic pancreatitis: Secondary | ICD-10-CM | POA: Diagnosis present

## 2022-10-19 DIAGNOSIS — R7989 Other specified abnormal findings of blood chemistry: Secondary | ICD-10-CM | POA: Diagnosis present

## 2022-10-19 DIAGNOSIS — F17210 Nicotine dependence, cigarettes, uncomplicated: Secondary | ICD-10-CM | POA: Diagnosis present

## 2022-10-19 HISTORY — DX: Alcohol abuse, in remission: F10.11

## 2022-10-19 LAB — HEPATIC FUNCTION PANEL
ALT: 50 U/L — ABNORMAL HIGH (ref 0–44)
AST: 55 U/L — ABNORMAL HIGH (ref 15–41)
Albumin: 2.5 g/dL — ABNORMAL LOW (ref 3.5–5.0)
Alkaline Phosphatase: 190 U/L — ABNORMAL HIGH (ref 38–126)
Bilirubin, Direct: 4.4 mg/dL — ABNORMAL HIGH (ref 0.0–0.2)
Indirect Bilirubin: 3.3 mg/dL — ABNORMAL HIGH (ref 0.3–0.9)
Total Bilirubin: 7.7 mg/dL — ABNORMAL HIGH (ref 0.3–1.2)
Total Protein: 7.3 g/dL (ref 6.5–8.1)

## 2022-10-19 LAB — CBC
HCT: 31.9 % — ABNORMAL LOW (ref 39.0–52.0)
Hemoglobin: 10.6 g/dL — ABNORMAL LOW (ref 13.0–17.0)
MCH: 32.9 pg (ref 26.0–34.0)
MCHC: 33.2 g/dL (ref 30.0–36.0)
MCV: 99.1 fL (ref 80.0–100.0)
Platelets: 334 10*3/uL (ref 150–400)
RBC: 3.22 MIL/uL — ABNORMAL LOW (ref 4.22–5.81)
RDW: 17.1 % — ABNORMAL HIGH (ref 11.5–15.5)
WBC: 13.5 10*3/uL — ABNORMAL HIGH (ref 4.0–10.5)
nRBC: 0 % (ref 0.0–0.2)

## 2022-10-19 LAB — TROPONIN I (HIGH SENSITIVITY)
Troponin I (High Sensitivity): 3 ng/L (ref <18)
Troponin I (High Sensitivity): 3 ng/L (ref <18)

## 2022-10-19 LAB — BASIC METABOLIC PANEL
Anion gap: 8 (ref 5–15)
BUN: 31 mg/dL — ABNORMAL HIGH (ref 8–23)
CO2: 22 mmol/L (ref 22–32)
Calcium: 8.6 mg/dL — ABNORMAL LOW (ref 8.9–10.3)
Chloride: 102 mmol/L (ref 98–111)
Creatinine, Ser: 1.15 mg/dL (ref 0.61–1.24)
GFR, Estimated: >60 mL/min (ref >60)
Glucose, Bld: 113 mg/dL — ABNORMAL HIGH (ref 70–99)
Potassium: 3.8 mmol/L (ref 3.5–5.1)
Sodium: 132 mmol/L — ABNORMAL LOW (ref 135–145)

## 2022-10-19 LAB — URINALYSIS, ROUTINE W REFLEX MICROSCOPIC
Glucose, UA: 100 mg/dL — AB
Hgb urine dipstick: NEGATIVE
Ketones, ur: NEGATIVE mg/dL
Leukocytes,Ua: NEGATIVE
Nitrite: NEGATIVE
Protein, ur: 100 mg/dL — AB
Specific Gravity, Urine: <=1.005 (ref 1.005–1.030)
pH: 6 (ref 5.0–8.0)

## 2022-10-19 LAB — URINALYSIS, MICROSCOPIC (REFLEX)

## 2022-10-19 MED ORDER — HYDROMORPHONE HCL 1 MG/ML IJ SOLN
1.0000 mg | Freq: Once | INTRAMUSCULAR | Status: AC
  Administered 2022-10-19: 1 mg via INTRAVENOUS
  Filled 2022-10-19: qty 1

## 2022-10-19 MED ORDER — ONDANSETRON HCL 4 MG PO TABS: 4.0000 mg | ORAL_TABLET | Freq: Four times a day (QID) | ORAL | Status: DC | PRN

## 2022-10-19 MED ORDER — SODIUM CHLORIDE 0.9 % IV BOLUS
1000.0000 mL | Freq: Once | INTRAVENOUS | Status: AC
  Administered 2022-10-19: 1000 mL via INTRAVENOUS

## 2022-10-19 MED ORDER — IOHEXOL 300 MG/ML  SOLN
100.0000 mL | Freq: Once | INTRAMUSCULAR | Status: DC | PRN
Start: 2022-10-19 — End: 2022-10-19

## 2022-10-19 MED ORDER — ACETAMINOPHEN 650 MG RE SUPP: 650.0000 mg | Freq: Four times a day (QID) | RECTAL | Status: DC | PRN

## 2022-10-19 MED ORDER — ENOXAPARIN SODIUM 40 MG/0.4ML IJ SOSY
40.0000 mg | PREFILLED_SYRINGE | Freq: Every day | INTRAMUSCULAR | Status: DC
  Administered 2022-10-20 – 2022-10-21 (×2): 40 mg via SUBCUTANEOUS
  Filled 2022-10-19 (×3): qty 0.4

## 2022-10-19 MED ORDER — ONDANSETRON HCL 4 MG/2ML IJ SOLN: 4.0000 mg | Freq: Four times a day (QID) | INTRAMUSCULAR | Status: DC | PRN

## 2022-10-19 MED ORDER — MORPHINE SULFATE (PF) 4 MG/ML IV SOLN
4.0000 mg | Freq: Once | INTRAVENOUS | Status: AC
  Administered 2022-10-19: 4 mg via INTRAVENOUS
  Filled 2022-10-19: qty 1

## 2022-10-19 MED ORDER — HYDROMORPHONE HCL 1 MG/ML IJ SOLN
0.5000 mg | INTRAMUSCULAR | Status: DC | PRN
  Administered 2022-10-20 – 2022-10-21 (×3): 1 mg via INTRAVENOUS
  Administered 2022-10-22: 0.5 mg via INTRAVENOUS
  Filled 2022-10-19: qty 0.5
  Filled 2022-10-19 (×4): qty 1

## 2022-10-19 MED ORDER — SODIUM CHLORIDE 0.9 % IV SOLN: INTRAVENOUS | Status: AC

## 2022-10-19 MED ORDER — IOHEXOL 350 MG/ML SOLN
100.0000 mL | Freq: Once | INTRAVENOUS | Status: AC | PRN
  Administered 2022-10-19: 100 mL via INTRAVENOUS

## 2022-10-19 MED ORDER — ACETAMINOPHEN 325 MG PO TABS: 650.0000 mg | ORAL_TABLET | Freq: Four times a day (QID) | ORAL | Status: DC | PRN

## 2022-10-19 NOTE — Plan of Care (Signed)
Plan of Care Note for accepted transfer   Patient: Jared Duran MRN: [id number]   DOA: 10/19/2022  Facility requesting transfer: [facility] Requesting Provider: Dr. Dina Rich Reason for transfer: pancreatitis Facility course:  Jared Duran is a 73 yo male with PMH HTN, pancreatitis who presented with acute onset abdominal pain, left sided.  Denies any etoh use in ~6 months. CTA showing possible acute on chronic pancreatitis and concern for pseudocyst in tail of pancreas.  Lipase 517 and LFTs concerning for obstructive pattern.  Tx accepted for MRCP and GI evaluation.   Plan of care: The patient is accepted for admission to [place]  unit, at [facility].  Author: Dwyane Dee, MD 10/19/2022  Check [email or web address] for on-call coverage.  Nursing staff, Please call [place] number on Amion as soon as patient's arrival, so appropriate admitting provider can evaluate the pt.

## 2022-10-19 NOTE — H&P (Signed)
History and Physical    Jared Duran NGE:[id number] DOB: 1950/10/31 DOA: 10/19/2022  PCP: Caffie Damme, MD   Patient coming from: Home   Chief Complaint: LUQ pain, N/V   HPI: Jared Duran is a 72 y.o. male with medical history significant for hypertension, alcohol abuse in remission, and chronic pancreatitis, now presenting with left upper quadrant abdominal pain, nausea, and vomiting.  Patient reports that he has been strictly avoiding alcohol for months now but has had pain in his left upper quadrant for approximately 1 month with radiation up into the chest, nausea, and nonbloody vomiting.  Pain has been worsening, becoming severe, and prompting his presentation to the ED.  [facility] ED Course: Upon arrival to the ED, patient is found to be afebrile and saturating low 90s on room air with systolic blood pressure 99 and greater.  Blood work most notable for alkaline phosphatase 190, AST 55, ALT 50, total bilirubin 7.7, and lipase 517.  Upper quadrant ultrasound reveals gallbladder hydrops with gallbladder sludge, dilated CBD, and mild to moderate intrahepatic biliary ductal dilatation.  GI was consulted by the ED physician and the patient was given 1 L of saline, morphine, and Dilaudid.  He was transferred to [facility] for admission.  Review of Systems:  All other systems reviewed and apart from HPI, are negative.  Past Medical History:  Diagnosis Date   Hypertension    Pancreatitis     Past Surgical History:  Procedure Laterality Date   ESOPHAGOGASTRODUODENOSCOPY (EGD) WITH PROPOFOL N/A 04/10/2021   Procedure: ESOPHAGOGASTRODUODENOSCOPY (EGD) WITH PROPOFOL;  Surgeon: Willis Modena, MD;  Location: [facility] ENDOSCOPY;  Service: Endoscopy;  Laterality: N/A;   EUS N/A 04/10/2021   Procedure: UPPER ENDOSCOPIC ULTRASOUND (EUS) LINEAR;  Surgeon: Willis Modena, MD;  Location: [facility] ENDOSCOPY;  Service: Endoscopy;  Laterality: N/A;    Social History:   reports that he has been  smoking cigarettes. He has been smoking an average of 1 pack per day. He has never used smokeless tobacco. He reports that he does not currently use alcohol. He reports that he does not use drugs.  No Known Allergies  History reviewed. No pertinent family history.   Prior to Admission medications   Medication Sig Start Date End Date Taking? Authorizing Provider  NIFEdipine (ADALAT CC) 90 MG 24 hr tablet Take 90 mg by mouth daily. 01/04/22  Yes [provider]  pantoprazole (PROTONIX) 40 MG tablet Take 1 tablet (40 mg total) by mouth 2 (two) times daily. 01/07/22 01/07/23 Yes Dorcas Carrow, MD  alum & mag hydroxide-simeth (MAALOX/MYLANTA) 200-200-20 MG/5ML suspension Take 30 mLs by mouth every 4 (four) hours as needed for indigestion or heartburn. 01/07/22   Dorcas Carrow, MD  calcium carbonate (TUMS EX) 750 MG chewable tablet Chew 1,500 mg by mouth daily as needed for heartburn.    [provider]    Physical Exam: Vitals:   10/19/22 1533 10/19/22 1759 10/19/22 2000 10/19/22 2100  BP: 108/85 122/84 113/79 111/75  Pulse: 87 80    Resp: 16 18 (!) 23 (!) 22  Temp:  98.2 F (36.8 C)    TempSrc:  Oral    SpO2: 94% 94%    Weight:      Height:  5\' 7"  (1.702 m)      Constitutional: NAD, no pallor or cyanosis  Eyes: PERTLA, lids and conjunctivae normal ENMT: Mucous membranes are moist. Posterior pharynx clear of any exudate or lesions.   Neck: supple, no masses  Respiratory: no wheezing, no  crackles. No accessory muscle use.  Cardiovascular: S1 & S2 heard, regular rate and rhythm. No extremity edema.   Abdomen: Soft, no distension, tender in LUQ without peritoneal signs. Bowel sounds active.  Musculoskeletal: no clubbing / cyanosis. No joint deformity upper and lower extremities.   Skin: no significant rashes, lesions, ulcers. Warm, dry, well-perfused. Neurologic: CN 2-12 grossly intact. Moving all extremities. Alert and oriented.  Psychiatric: Calm. Cooperative.    Labs  and Imaging on Admission: I have personally reviewed following labs and imaging studies  CBC: Recent Labs  Lab 10/19/22 1301  WBC 13.5*  HGB 10.6*  HCT 31.9*  MCV 99.1  PLT 334   Basic Metabolic Panel: Recent Labs  Lab 10/19/22 1301  NA 132*  K 3.8  CL 102  CO2 22  GLUCOSE 113*  BUN 31*  CREATININE 1.15  CALCIUM 8.6*   GFR: Estimated Creatinine Clearance: 50.6 mL/min (by C-G formula based on SCr of 1.15 mg/dL). Liver Function Tests: Recent Labs  Lab 10/19/22 1524  AST 55*  ALT 50*  ALKPHOS 190*  BILITOT 7.7*  PROT 7.3  ALBUMIN 2.5*   Recent Labs  Lab 10/19/22 1524  LIPASE 517*   No results for input(s): "AMMONIA" in the last 168 hours. Coagulation Profile: No results for input(s): "INR", "PROTIME" in the last 168 hours. Cardiac Enzymes: No results for input(s): "CKTOTAL", "CKMB", "CKMBINDEX", "TROPONINI" in the last 168 hours. BNP (last 3 results) No results for input(s): "PROBNP" in the last 8760 hours. HbA1C: No results for input(s): "HGBA1C" in the last 72 hours. CBG: No results for input(s): "GLUCAP" in the last 168 hours. Lipid Profile: No results for input(s): "CHOL", "HDL", "LDLCALC", "TRIG", "CHOLHDL", "LDLDIRECT" in the last 72 hours. Thyroid Function Tests: No results for input(s): "TSH", "T4TOTAL", "FREET4", "T3FREE", "THYROIDAB" in the last 72 hours. Anemia Panel: No results for input(s): "VITAMINB12", "FOLATE", "FERRITIN", "TIBC", "IRON", "RETICCTPCT" in the last 72 hours. Urine analysis:    Component Value Date/Time   COLORURINE AMBER (A) 10/19/2022 2147   APPEARANCEUR CLEAR 10/19/2022 2147   LABSPEC <=1.005 10/19/2022 2147   PHURINE 6.0 10/19/2022 2147   GLUCOSEU 100 (A) 10/19/2022 2147   HGBUR NEGATIVE 10/19/2022 2147   BILIRUBINUR LARGE (A) 10/19/2022 2147   KETONESUR NEGATIVE 10/19/2022 2147   PROTEINUR 100 (A) 10/19/2022 2147   NITRITE NEGATIVE 10/19/2022 2147   LEUKOCYTESUR NEGATIVE 10/19/2022 2147   Sepsis  Labs: @LABRCNTIP (procalcitonin:4,lacticidven:4) )No results found for this or any previous visit (from the past 240 hour(s)).   Radiological Exams on Admission: US Abdomen Limited RUQ (LIVER/GB)  Result Date: 10/19/2022 CLINICAL DATA:  Epigastric pain EXAM: ULTRASOUND ABDOMEN LIMITED RIGHT UPPER QUADRANT COMPARISON:  CT angiogram chest abdomen and pelvis 10/19/2022 FINDINGS: Gallbladder: No gallstones or wall thickening visualized. Gallbladder is dilated. Gallbladder sludge is present. No sonographic Murphy sign noted by sonographer. Common bile duct: Diameter: 10 mm. There is also mild-to-moderate intrahepatic biliary ductal dilatation. Liver: Two hepatic cysts are identified. Right lobe cyst measures 11 x 10 x 11 mm. Left lobe cyst measures 11 x 7 x 8 mm. Within normal limits in parenchymal echogenicity. Portal vein is patent on color Doppler imaging with normal direction of blood flow towards the liver. Other: Incidental right renal simple cyst measuring 2.2 x 1.7 x 3.4 cm. IMPRESSION: 1. Gallbladder hydrops with gallbladder sludge. No gallstones or wall thickening. 2. Dilated common bile duct with mild-to-moderate intrahepatic biliary ductal dilatation. Recommend further evaluation with MRCP. Electronically Signed   By: Mcneil Sober.D.  On: 10/19/2022 21:06   CT Angio Chest/Abd/Pel for Dissection W and/or W/WO  Result Date: 10/19/2022 CLINICAL DATA:  Concern for aerated aneurysm. Left-sided chest pain. EXAM: CT ANGIOGRAPHY CHEST, ABDOMEN AND PELVIS TECHNIQUE: Non-contrast CT of the chest was initially obtained. Multidetector CT imaging through the chest, abdomen and pelvis was performed using the standard protocol during bolus administration of intravenous contrast. Multiplanar reconstructed images and MIPs were obtained and reviewed to evaluate the vascular anatomy. RADIATION DOSE REDUCTION: This exam was performed according to the departmental dose-optimization program which includes automated  exposure control, adjustment of the mA and/or kV according to patient size and/or use of iterative reconstruction technique. CONTRAST:  OMNIPAQUE IOHEXOL 350 MG/ML SOLN COMPARISON:  CT abdomen pelvis dated 01/04/2022. FINDINGS: Evaluation is limited due to streak artifact caused by patient's arms. Evaluation is also limited due to anasarca and paucity of intra-abdominal fat. CTA CHEST FINDINGS Cardiovascular: There is no cardiomegaly or pericardial effusion. There is mild atherosclerotic calcification of the thoracic aorta. No aneurysmal dilatation or dissection. The aorta is tortuous. The origins of the great vessels of the aortic arch appear patent. No pulmonary artery embolus identified. Mediastinum/Nodes: No hilar or mediastinal adenopathy. The esophagus is grossly unremarkable. No mediastinal fluid collection. Lungs/Pleura: Small left pleural effusion with partial compressive atelectasis of the left versus pneumonia. There is background of centrilobular emphysema. There is no pneumothorax. The central airways are patent. Musculoskeletal: Degenerative changes of the spine. No acute osseous pathology. Review of the MIP images confirms the above findings. CTA ABDOMEN AND PELVIS FINDINGS VASCULAR Aorta: Mild atherosclerotic calcification of the abdominal aorta. No aneurysmal dilatation or dissection. Celiac: Patent without evidence of aneurysm, dissection, vasculitis or significant stenosis. SMA: Patent without evidence of aneurysm, dissection, vasculitis or significant stenosis. Renals: Both renal arteries are patent without evidence of aneurysm, dissection, vasculitis, fibromuscular dysplasia or significant stenosis. IMA: Patent without evidence of aneurysm, dissection, vasculitis or significant stenosis. Inflow: Mild atherosclerotic calcification of the iliac arteries. The iliac arteries are patent. No aneurysmal dilatation or dissection. Veins: No obvious venous abnormality within the limitations of this  arterial phase study. Review of the MIP images confirms the above findings. NON-VASCULAR No intra-abdominal free air.  Small free fluid within the pelvis. Hepatobiliary: Several small liver cysts, too small to characterize. There is mild dilatation of the biliary tree. No calcified stone within the gallbladder. Pancreas: There is coarse calcification of the head and uncinate process of the pancreas sequela of chronic pancreatitis. Correlation with pancreatic enzymes recommended to evaluate for for possibility of acute on chronic pancreatitis. There is a 3.3 x 2.6 cm cyst in the tail of the pancreas most consistent with a pseudocyst. Attention on follow-up imaging recommended. Spleen: The spleen is unremarkable. There is perisplenic free fluid which appears somewhat loculated, likely sequela of pancreatitis. This fluid may represent a pseudocyst. Adrenals/Urinary Tract: The adrenal glands are poorly visualized. There is a small right renal upper pole cyst. There is no hydronephrosis on either side. The urinary bladder is grossly unremarkable. Stomach/Bowel: There is moderate stool throughout the colon. There is no bowel obstruction. Lymphatic: No adenopathy. Reproductive: The prostate gland is mildly enlarged measuring 4.5 cm in transverse axial diameter. The seminal vesicles are symmetric. Other: Diffuse mesenteric and subcutaneous edema and anasarca. Musculoskeletal: Degenerative changes of the spine. No acute osseous pathology. Review of the MIP images confirms the above findings. IMPRESSION: 1. No aortic aneurysm or dissection. 2. Small left pleural effusion with partial compressive atelectasis of the left versus pneumonia.  3. Sequela of chronic pancreatitis including pancreatic head calcification pancreatic tail pseudocyst. Correlation with pancreatic enzymes recommended to evaluate for possibility of acute on chronic pancreatitis. 4. Perisplenic fluid collection most consistent with post inflammatory collection  or developing pseudocysts. An abscess sign report cyst is not excluded. 5. No bowel obstruction. 6. Aortic Atherosclerosis (ICD10-I70.0) and Emphysema (ICD10-J43.9). Electronically Signed   By: Elgie Collard M.D.   On: 10/19/2022 17:41   DG Chest 2 View  Result Date: 10/19/2022 CLINICAL DATA:  Chest pain EXAM: CHEST - 2 VIEW COMPARISON:  01/04/2022 FINDINGS: Asymmetric elevation left hemidiaphragm. Trace atelectasis noted left base with possible tiny left pleural effusion. Right lung clear. The cardiopericardial silhouette is within normal limits for size. The visualized bony structures of the thorax are unremarkable. IMPRESSION: Asymmetric elevation left hemidiaphragm with trace left base atelectasis and possible tiny left pleural effusion. Electronically Signed   By: Kennith Center M.D.   On: 10/19/2022 13:41    EKG: Independently reviewed. Sinus rhythm, LAD.   Assessment/Plan   1. Acute on chronic pancreatitis; biliary obstruction  - Presents with LUQ abdominal pain and N/V and found to have lipase 517, mild transaminase elevations, t bili 7.7, and RUQ [place] with gallbladder sludge and CBD dilatation  - GI was consulted by ED physician and patient treated with IVF and analgesics  - Continue bowel rest, IVF, and pain-control, check MRCP and triglycerides, trend LFTs    2. HTN  - BP low-normal in ED and antihypertensives held on admission    3. Hx of alcohol abuse  - Reports strict abstinence for months    DVT prophylaxis: Lovenox  Code Status: Full  Level of Care: Level of care: Med-Surg Family Communication: Niece and nephew at bedside  Disposition Plan:  Patient is from: Home  Anticipated d/c is to: Home  Anticipated d/c date is: 10/23/22  Patient currently: Pending MRCP, pain-control  Consults called: GI consulted by ED physician  Admission status: None     Briscoe Deutscher, MD [facility]  10/20/2022, 12:00 AM

## 2022-10-19 NOTE — ED Notes (Signed)
Patient transported to CT 

## 2022-10-19 NOTE — ED Notes (Signed)
[facility] picking up patient for transport to [facility] at this time. Pt in no acute distress upon departure. Belongings and paperwork sent with [facility].

## 2022-10-19 NOTE — ED Notes (Signed)
[facility] called for transport. 

## 2022-10-19 NOTE — ED Triage Notes (Signed)
Pt arrives pov, slow gait with c/o LT side CP, LT shoulder pain and LUQ pain x 2 weeks. Endorses n/v. Took oxycodone at 0200 with some relief.

## 2022-10-19 NOTE — ED Provider Notes (Signed)
[facility] AT [place] Provider Note   CSN: [id number] Arrival date & time: 10/19/22  1231     History  Chief Complaint  Patient presents with   Chest Pain    Jared Duran is a 72 y.o. male.  HPI   72 year old male with past medical history of HTN, pancreatitis presents emergency department with severe left-sided chest/abdominal pain.  Patient states this has been going on for weeks but cannot give me an accurate timeframe.  He is a poor historian and regards to specifics.  He appears very uncomfortable on exam, frequently jumping and grabbing his left side/back.  He endorses nausea/vomiting, decreased p.o. appetite.  Denies any fever or cough.  States he is never had pain like this before.  No history of AAA.  Home Medications Prior to Admission medications   Medication Sig Start Date End Date Taking? Authorizing Provider  alum & mag hydroxide-simeth (MAALOX/MYLANTA) 200-200-20 MG/5ML suspension Take 30 mLs by mouth every 4 (four) hours as needed for indigestion or heartburn. 01/07/22   [name], MD  calcium carbonate (TUMS EX) 750 MG chewable tablet Chew 1,500 mg by mouth daily as needed for heartburn.    [provider]  NIFEdipine (ADALAT CC) 90 MG 24 hr tablet Take 90 mg by mouth daily. 01/04/22   [provider]  pantoprazole (PROTONIX) 40 MG tablet Take 1 tablet (40 mg total) by mouth 2 (two) times daily. 01/07/22 01/07/23  [name], MD      Allergies    Patient has no known allergies.    Review of Systems   Review of Systems  Constitutional:  Positive for appetite change. Negative for fever.  Respiratory:  Positive for shortness of breath.   Cardiovascular:  Positive for chest pain.  Gastrointestinal:  Positive for abdominal pain, nausea and vomiting. Negative for blood in stool and diarrhea.  Genitourinary:  Positive for flank pain.  Musculoskeletal:  Positive for back pain.  Skin:  Negative for rash.   Neurological:  Negative for headaches.    Physical Exam Updated Vital Signs BP 108/85   Pulse 87   Temp 98.1 F (36.7 C) (Oral)   Resp 16   Wt 59 kg   SpO2 94%   BMI 19.20 kg/m  Physical Exam Vitals and nursing note reviewed.  Constitutional:      Appearance: Normal appearance. He is ill-appearing.  HENT:     Head: Normocephalic.     Mouth/Throat:     Mouth: Mucous membranes are moist.  Cardiovascular:     Rate and Rhythm: Normal rate.  Pulmonary:     Effort: Pulmonary effort is normal. No respiratory distress.  Abdominal:     Tenderness: There is abdominal tenderness. There is guarding and rebound.     Comments: Rigid abdomen, thin, no distention or fluid wave. Diffuse TTP, worse in LUQ  Skin:    General: Skin is warm.  Neurological:     Mental Status: He is alert and oriented to person, place, and time. Mental status is at baseline.  Psychiatric:        Mood and Affect: Mood normal.     ED Results / Procedures / Treatments   Labs (all labs ordered are listed, but only abnormal results are displayed) Labs Reviewed  BASIC METABOLIC PANEL - Abnormal; Notable for the following components:      Result Value   Sodium 132 (*)    Glucose, Bld 113 (*)    BUN 31 (*)  Calcium 8.6 (*)    All other components within normal limits  CBC - Abnormal; Notable for the following components:   WBC 13.5 (*)    RBC 3.22 (*)    Hemoglobin 10.6 (*)    HCT 31.9 (*)    RDW 17.1 (*)    All other components within normal limits  HEPATIC FUNCTION PANEL - Abnormal; Notable for the following components:   Albumin 2.5 (*)    AST 55 (*)    ALT 50 (*)    Alkaline Phosphatase 190 (*)    Total Bilirubin 7.7 (*)    Bilirubin, Direct 4.4 (*)    Indirect Bilirubin 3.3 (*)    All other components within normal limits  LIPASE, BLOOD - Abnormal; Notable for the following components:   Lipase 517 (*)    All other components within normal limits  URINALYSIS, ROUTINE W REFLEX MICROSCOPIC   TROPONIN I (HIGH SENSITIVITY)  TROPONIN I (HIGH SENSITIVITY)    EKG EKG Interpretation  Date/Time:  Sunday October 19 2022 13:03:55 EST Ventricular Rate:  88 PR Interval:  151 QRS Duration: 110 QT Interval:  360 QTC Calculation: 436 R Axis:   -44 Text Interpretation: Sinus rhythm Left axis deviation Borderline T wave abnormalities Confirmed by Lavenia Atlas [phone]) on 10/19/2022 3:30:14 PM  Radiology DG Chest 2 View  Result Date: 10/19/2022 CLINICAL DATA:  Chest pain EXAM: CHEST - 2 VIEW COMPARISON:  01/04/2022 FINDINGS: Asymmetric elevation left hemidiaphragm. Trace atelectasis noted left base with possible tiny left pleural effusion. Right lung clear. The cardiopericardial silhouette is within normal limits for size. The visualized bony structures of the thorax are unremarkable. IMPRESSION: Asymmetric elevation left hemidiaphragm with trace left base atelectasis and possible tiny left pleural effusion. Electronically Signed   By: Misty Stanley M.D.   On: 10/19/2022 13:41    Procedures Procedures    Medications Ordered in ED Medications  morphine (PF) 4 MG/ML injection 4 mg (has no administration in time range)  iohexol (OMNIPAQUE) 300 MG/ML solution 100 mL (has no administration in time range)    ED Course/ Medical Decision Making/ A&P                             Medical Decision Making Amount and/or Complexity of Data Reviewed Labs: ordered. Radiology: ordered.  Risk Prescription drug management.   72 year old male presents emergency department with left-sided chest/abdominal pain.  Vitals are stable on arrival.  He has a diffusely tender and rigid abdomen.  Blood work shows elevated lipase, mild transaminitis and a bilirubin of 7.7 with a mild leukocytosis. Slightly more anemic than baseline, denies hematemesis or bloody/black stool. Trop negative.  CT imaging shows small left pleural effusion and other findings of acute/chronic pancreatitis with calcifications  and pseudocyst.  Spoke with on-call gastroenterologist, Dr. Michail Sermon who recommends admission, pancreatitis workup and possible MRI/MRCP.  Patients evaluation and results requires admission for further treatment and care.  Spoke with hospitalist, reviewed patient's ED course and they accept admission.  Patient agrees with admission plan, offers no new complaints and is stable/unchanged at time of admit.        Final Clinical Impression(s) / ED Diagnoses Final diagnoses:  None    Rx / DC Orders ED Discharge Orders     None         Lorelle Gibbs, DO 10/19/22 2046

## 2022-10-19 NOTE — ED Notes (Signed)
Pt transported to CT ?

## 2022-10-19 NOTE — ED Notes (Signed)
Gave pt urinal 

## 2022-10-20 ENCOUNTER — Encounter (HOSPITAL_COMMUNITY): Payer: Self-pay | Admitting: Family Medicine

## 2022-10-20 ENCOUNTER — Inpatient Hospital Stay (HOSPITAL_COMMUNITY): Payer: Medicare PPO

## 2022-10-20 DIAGNOSIS — K861 Other chronic pancreatitis: Secondary | ICD-10-CM

## 2022-10-20 LAB — HIV ANTIBODY (ROUTINE TESTING W REFLEX): HIV Screen 4th Generation wRfx: NONREACTIVE

## 2022-10-20 LAB — CBC
HCT: 24.3 % — ABNORMAL LOW (ref 39.0–52.0)
Hemoglobin: 8.2 g/dL — ABNORMAL LOW (ref 13.0–17.0)
MCH: 33.2 pg (ref 26.0–34.0)
MCHC: 33.7 g/dL (ref 30.0–36.0)
MCV: 98.4 fL (ref 80.0–100.0)
Platelets: 261 10*3/uL (ref 150–400)
RBC: 2.47 MIL/uL — ABNORMAL LOW (ref 4.22–5.81)
RDW: 16.9 % — ABNORMAL HIGH (ref 11.5–15.5)
WBC: 10.5 10*3/uL (ref 4.0–10.5)
nRBC: 0 % (ref 0.0–0.2)

## 2022-10-20 LAB — COMPREHENSIVE METABOLIC PANEL
ALT: 35 U/L (ref 0–44)
AST: 38 U/L (ref 15–41)
Albumin: 1.8 g/dL — ABNORMAL LOW (ref 3.5–5.0)
Alkaline Phosphatase: 142 U/L — ABNORMAL HIGH (ref 38–126)
Anion gap: 9 (ref 5–15)
BUN: 28 mg/dL — ABNORMAL HIGH (ref 8–23)
CO2: 22 mmol/L (ref 22–32)
Calcium: 8.2 mg/dL — ABNORMAL LOW (ref 8.9–10.3)
Chloride: 104 mmol/L (ref 98–111)
Creatinine, Ser: 1.03 mg/dL (ref 0.61–1.24)
GFR, Estimated: >60 mL/min (ref >60)
Glucose, Bld: 91 mg/dL (ref 70–99)
Potassium: 4 mmol/L (ref 3.5–5.1)
Sodium: 135 mmol/L (ref 135–145)
Total Bilirubin: 6 mg/dL — ABNORMAL HIGH (ref 0.3–1.2)
Total Protein: 5.5 g/dL — ABNORMAL LOW (ref 6.5–8.1)

## 2022-10-20 LAB — MAGNESIUM: Magnesium: 1.9 mg/dL (ref 1.7–2.4)

## 2022-10-20 LAB — TRIGLYCERIDES: Triglycerides: 122 mg/dL (ref <150)

## 2022-10-20 LAB — LIPASE, BLOOD: Lipase: 517 U/L — ABNORMAL HIGH (ref 11–51)

## 2022-10-20 MED ORDER — GADOBUTROL 1 MMOL/ML IV SOLN
6.0000 mL | Freq: Once | INTRAVENOUS | Status: AC | PRN
  Administered 2022-10-20: 6 mL via INTRAVENOUS

## 2022-10-20 NOTE — Consult Note (Signed)
Referring Provider: [facility] Primary Care Physician:  [name], MD Primary Gastroenterologist:  Dr. Paulita Fujita  Reason for Consultation:  Pancreatitis  HPI: Jared Duran is a 72 y.o. male with medical history significant for hypertension, alcohol abuse in remission, and chronic pancreatitis, now presenting with left upper quadrant abdominal pain, nausea, and vomiting.   Patient states he has had ongoing generalized abdominal pain, nausea, vomiting for weeks.  Have been getting progressively worse.  Has been strictly avoiding alcohol for months now.  Lipase 517.  AST 55/ALT 50/alk phos 190.  Upper quadrant ultrasound reveals gallbladder hydrops with gallbladder sludge, dilated CBD, and mild to moderate intrahepatic biliary ductal dilation  patient underwent EUS/EGD 05/07/2021 with Dr. Paulita Fujita for pancreatic mass/pancreatitis/elevated LFTs: endosonographic imaging of the pancreas shows changes suggestive for moderate chronic pancreatitis.  No pancreatic mass. Resolving inflammation at that time  Past Medical History:  Diagnosis Date   Hypertension    Pancreatitis     Past Surgical History:  Procedure Laterality Date   ESOPHAGOGASTRODUODENOSCOPY (EGD) WITH PROPOFOL N/A 04/10/2021   Procedure: ESOPHAGOGASTRODUODENOSCOPY (EGD) WITH PROPOFOL;  Surgeon: [name], MD;  Location: [facility];  Service: Endoscopy;  Laterality: N/A;   EUS N/A 04/10/2021   Procedure: UPPER ENDOSCOPIC ULTRASOUND (EUS) LINEAR;  Surgeon: [name], MD;  Location: [facility];  Service: Endoscopy;  Laterality: N/A;    Prior to Admission medications   Medication Sig Start Date End Date Taking? Authorizing Provider  NIFEdipine (ADALAT CC) 90 MG 24 hr tablet Take 90 mg by mouth daily. 01/04/22  Yes [provider]  pantoprazole (PROTONIX) 40 MG tablet Take 1 tablet (40 mg total) by mouth 2 (two) times daily. 01/07/22 01/07/23 Yes [name], MD  alum & mag hydroxide-simeth (MAALOX/MYLANTA) 200-200-20  MG/5ML suspension Take 30 mLs by mouth every 4 (four) hours as needed for indigestion or heartburn. 01/07/22   [name], MD  calcium carbonate (TUMS EX) 750 MG chewable tablet Chew 1,500 mg by mouth daily as needed for heartburn.    [provider]    Scheduled Meds:  enoxaparin (LOVENOX) injection  40 mg Subcutaneous Daily   Continuous Infusions:  sodium chloride 150 mL/hr at 10/20/22 0808   PRN Meds:.acetaminophen **OR** acetaminophen, HYDROmorphone (DILAUDID) injection, ondansetron **OR** ondansetron (ZOFRAN) IV  Allergies as of 10/19/2022   (No Known Allergies)    History reviewed. No pertinent family history.  Social History   Socioeconomic History   Marital status: Legally Separated    Spouse name: Not on file   Number of children: Not on file   Years of education: Not on file   Highest education level: Not on file  Occupational History   Not on file  Tobacco Use   Smoking status: Every Day    Packs/day: 1.00    Types: Cigarettes   Smokeless tobacco: Never  Substance and Sexual Activity   Alcohol use: Not Currently    Comment: occ   Drug use: No   Sexual activity: Not on file  Other Topics Concern   Not on file  Social History Narrative   Not on file   Social Determinants of Health   Financial Resource Strain: Not on file  Food Insecurity: No Food Insecurity (10/19/2022)   Hunger Vital Sign    Worried About Running Out of Food in the Last Year: Never true    Ran Out of Food in the Last Year: Never true  Transportation Needs: No Transportation Needs (10/19/2022)   PRAPARE - Transportation    Lack  of Transportation (Medical): No    Lack of Transportation (Non-Medical): No  Physical Activity: Not on file  Stress: Not on file  Social Connections: Not on file  Intimate Partner Violence: Not At Risk (10/19/2022)   Humiliation, Afraid, Rape, and Kick questionnaire    Fear of Current or Ex-Partner: No    Emotionally Abused: No    Physically Abused:  No    Sexually Abused: No    Review of Systems: All negative except as stated above in HPI.  Physical Exam:Physical Exam Constitutional:      Appearance: Normal appearance. He is normal weight.  HENT:     Head: Normocephalic and atraumatic.     Nose: Nose normal. No congestion.     Mouth/Throat:     Mouth: Mucous membranes are moist.     Pharynx: Oropharynx is clear.  Eyes:     Extraocular Movements: Extraocular movements intact.     Conjunctiva/sclera: Conjunctivae normal.  Cardiovascular:     Rate and Rhythm: Normal rate and regular rhythm.  Pulmonary:     Effort: Pulmonary effort is normal. No respiratory distress.  Abdominal:     General: Bowel sounds are normal. There is no distension.     Palpations: Abdomen is soft. There is no mass.     Tenderness: There is abdominal tenderness. There is no guarding or rebound.     Hernia: No hernia is present.  Musculoskeletal:        General: No swelling. Normal range of motion.     Cervical back: Normal range of motion and neck supple.  Skin:    General: Skin is warm and dry.  Neurological:     General: No focal deficit present.     Mental Status: He is oriented to person, place, and time.  Psychiatric:        Mood and Affect: Mood normal.        Behavior: Behavior normal.        Thought Content: Thought content normal.        Judgment: Judgment normal.     Vital signs: Vitals:   10/20/22 0445 10/20/22 0800  BP: 110/77 112/72  Pulse: 78 68  Resp: 16   Temp: 97.8 F (36.6 C) (!) 97.4 F (36.3 C)  SpO2: 94% 95%        GI:  Lab Results: Recent Labs    10/19/22 1301 10/20/22 0507  WBC 13.5* 10.5  HGB 10.6* 8.2*  HCT 31.9* 24.3*  PLT 334 261   BMET Recent Labs    10/19/22 1301 10/20/22 0507  NA 132* 135  K 3.8 4.0  CL 102 104  CO2 22 22  GLUCOSE 113* 91  BUN 31* 28*  CREATININE 1.15 1.03  CALCIUM 8.6* 8.2*   LFT Recent Labs    10/19/22 1524 10/20/22 0507  PROT 7.3 5.5*  ALBUMIN 2.5* 1.8*   AST 55* 38  ALT 50* 35  ALKPHOS 190* 142*  BILITOT 7.7* 6.0*  BILIDIR 4.4*  --   IBILI 3.3*  --    PT/INR No results for input(s): "LABPROT", "INR" in the last 72 hours.   Studies/Results: US Abdomen Limited RUQ (LIVER/GB)  Result Date: 10/19/2022 CLINICAL DATA:  Epigastric pain EXAM: ULTRASOUND ABDOMEN LIMITED RIGHT UPPER QUADRANT COMPARISON:  CT angiogram chest abdomen and pelvis 10/19/2022 FINDINGS: Gallbladder: No gallstones or wall thickening visualized. Gallbladder is dilated. Gallbladder sludge is present. No sonographic Murphy sign noted by sonographer. Common bile duct: Diameter: 10 mm. There is also  mild-to-moderate intrahepatic biliary ductal dilatation. Liver: Two hepatic cysts are identified. Right lobe cyst measures 11 x 10 x 11 mm. Left lobe cyst measures 11 x 7 x 8 mm. Within normal limits in parenchymal echogenicity. Portal vein is patent on color Doppler imaging with normal direction of blood flow towards the liver. Other: Incidental right renal simple cyst measuring 2.2 x 1.7 x 3.4 cm. IMPRESSION: 1. Gallbladder hydrops with gallbladder sludge. No gallstones or wall thickening. 2. Dilated common bile duct with mild-to-moderate intrahepatic biliary ductal dilatation. Recommend further evaluation with MRCP. Electronically Signed   By: Ronney Asters M.D.   On: 10/19/2022 21:06   CT Angio Chest/Abd/Pel for Dissection W and/or W/WO  Result Date: 10/19/2022 CLINICAL DATA:  Concern for aerated aneurysm. Left-sided chest pain. EXAM: CT ANGIOGRAPHY CHEST, ABDOMEN AND PELVIS TECHNIQUE: Non-contrast CT of the chest was initially obtained. Multidetector CT imaging through the chest, abdomen and pelvis was performed using the standard protocol during bolus administration of intravenous contrast. Multiplanar reconstructed images and MIPs were obtained and reviewed to evaluate the vascular anatomy. RADIATION DOSE REDUCTION: This exam was performed according to the departmental  dose-optimization program which includes automated exposure control, adjustment of the mA and/or kV according to patient size and/or use of iterative reconstruction technique. CONTRAST:  148m OMNIPAQUE IOHEXOL 350 MG/ML SOLN COMPARISON:  CT abdomen pelvis dated 01/04/2022. FINDINGS: Evaluation is limited due to streak artifact caused by patient's arms. Evaluation is also limited due to anasarca and paucity of intra-abdominal fat. CTA CHEST FINDINGS Cardiovascular: There is no cardiomegaly or pericardial effusion. There is mild atherosclerotic calcification of the thoracic aorta. No aneurysmal dilatation or dissection. The aorta is tortuous. The origins of the great vessels of the aortic arch appear patent. No pulmonary artery embolus identified. Mediastinum/Nodes: No hilar or mediastinal adenopathy. The esophagus is grossly unremarkable. No mediastinal fluid collection. Lungs/Pleura: Small left pleural effusion with partial compressive atelectasis of the left versus pneumonia. There is background of centrilobular emphysema. There is no pneumothorax. The central airways are patent. Musculoskeletal: Degenerative changes of the spine. No acute osseous pathology. Review of the MIP images confirms the above findings. CTA ABDOMEN AND PELVIS FINDINGS VASCULAR Aorta: Mild atherosclerotic calcification of the abdominal aorta. No aneurysmal dilatation or dissection. Celiac: Patent without evidence of aneurysm, dissection, vasculitis or significant stenosis. SMA: Patent without evidence of aneurysm, dissection, vasculitis or significant stenosis. Renals: Both renal arteries are patent without evidence of aneurysm, dissection, vasculitis, fibromuscular dysplasia or significant stenosis. IMA: Patent without evidence of aneurysm, dissection, vasculitis or significant stenosis. Inflow: Mild atherosclerotic calcification of the iliac arteries. The iliac arteries are patent. No aneurysmal dilatation or dissection. Veins: No obvious  venous abnormality within the limitations of this arterial phase study. Review of the MIP images confirms the above findings. NON-VASCULAR No intra-abdominal free air.  Small free fluid within the pelvis. Hepatobiliary: Several small liver cysts, too small to characterize. There is mild dilatation of the biliary tree. No calcified stone within the gallbladder. Pancreas: There is coarse calcification of the head and uncinate process of the pancreas sequela of chronic pancreatitis. Correlation with pancreatic enzymes recommended to evaluate for for possibility of acute on chronic pancreatitis. There is a 3.3 x 2.6 cm cyst in the tail of the pancreas most consistent with a pseudocyst. Attention on follow-up imaging recommended. Spleen: The spleen is unremarkable. There is perisplenic free fluid which appears somewhat loculated, likely sequela of pancreatitis. This fluid may represent a pseudocyst. Adrenals/Urinary Tract: The adrenal glands are poorly  visualized. There is a small right renal upper pole cyst. There is no hydronephrosis on either side. The urinary bladder is grossly unremarkable. Stomach/Bowel: There is moderate stool throughout the colon. There is no bowel obstruction. Lymphatic: No adenopathy. Reproductive: The prostate gland is mildly enlarged measuring 4.5 cm in transverse axial diameter. The seminal vesicles are symmetric. Other: Diffuse mesenteric and subcutaneous edema and anasarca. Musculoskeletal: Degenerative changes of the spine. No acute osseous pathology. Review of the MIP images confirms the above findings. IMPRESSION: 1. No aortic aneurysm or dissection. 2. Small left pleural effusion with partial compressive atelectasis of the left versus pneumonia. 3. Sequela of chronic pancreatitis including pancreatic head calcification pancreatic tail pseudocyst. Correlation with pancreatic enzymes recommended to evaluate for possibility of acute on chronic pancreatitis. 4. Perisplenic fluid collection  most consistent with post inflammatory collection or developing pseudocysts. An abscess sign report cyst is not excluded. 5. No bowel obstruction. 6. Aortic Atherosclerosis (ICD10-I70.0) and Emphysema (ICD10-J43.9). Electronically Signed   By: Anner Crete M.D.   On: 10/19/2022 17:41   DG Chest 2 View  Result Date: 10/19/2022 CLINICAL DATA:  Chest pain EXAM: CHEST - 2 VIEW COMPARISON:  01/04/2022 FINDINGS: Asymmetric elevation left hemidiaphragm. Trace atelectasis noted left base with possible tiny left pleural effusion. Right lung clear. The cardiopericardial silhouette is within normal limits for size. The visualized bony structures of the thorax are unremarkable. IMPRESSION: Asymmetric elevation left hemidiaphragm with trace left base atelectasis and possible tiny left pleural effusion. Electronically Signed   By: Misty Stanley M.D.   On: 10/19/2022 13:41    Impression: Acute on chronic pancreatitis; biliary obstruction   - Lipase 517 -AST 38/ALT 35/alk phos 142, improving -T. bili 6.0, improved -WBC 10.5 -MRCP shows biliary ductal dilation with abrupt tapering of pancreatic duct dilated of the pancreas.  No choledocholithiasis.  Calcification of pancreas.  3.5 cm cyst in the tail of the pancreas with some debris's within the collection.  A 13 mm cyst in the head of the pancreas, likely pseudocysts superinfection cannot be excluded.  Pseudocyst versus abscess and spleen.  Distended gallbladder with sludge.  Thickening and jejunal small bowel loops of left upper quadrant   Plan: Acute on chronic pancreatitis with biliary obstruction.  Recommend continued supportive care, IV fluids, NPO.  Patient would like to eat but still having severe abdominal pain.  Recommend continued n.p.o. status until abdominal pain improves and then can consider advancing to clear liquids. Continue pain control [facility] will follow    LOS: 1 day   Jilene Spohr Radford Pax  PA-C 10/20/2022, 8:40 AM  Contact #   [phone]

## 2022-10-20 NOTE — Progress Notes (Signed)
  Progress Note   Patient: Jared Duran [id number] DOB: [birth year] DOA: 10/19/2022     1 DOS: the patient was seen and examined on 10/20/2022   Brief hospital course: 72 y.o. male with medical history significant for hypertension, alcohol abuse in remission, and chronic pancreatitis, now presenting with left upper quadrant abdominal pain, nausea, and vomiting. Pt was admitted for acute pancreatitis  Assessment and Plan: 1. Acute on chronic pancreatitis; biliary obstruction  - Presents with LUQ abdominal pain and N/V and found to have lipase 517, mild transaminase elevations, t bili 7.7, and RUQ [place] with gallbladder sludge and CBD dilatation  -MRCP reviewed. Stable intra and extrahpeatic biliary dilitation. GB sludge without stones, findings of likely pseudocysts on imaging -Pt reports feeling better, still has some LUQ discomfort -GI consulted, recs to keep NPO for  now until abd pain improves   2. HTN  - BP low-normal in ED and antihypertensives held on admission     3. Hx of alcohol abuse  - Reports strict abstinence for months       Subjective: Reports feeling better today. Still having LUQ discomfort  Physical Exam: Vitals:   10/19/22 2100 10/20/22 0445 10/20/22 0500 10/20/22 0800  BP: 111/75 110/77  112/72  Pulse:  78  68  Resp: (!) 22 16    Temp:  97.8 F (36.6 C)  (!) 97.4 F (36.3 C)  TempSrc:  Oral  Oral  SpO2:  94%  95%  Weight:   62 kg   Height:       General exam: Awake, laying in bed, in nad Respiratory system: Normal respiratory effort, no wheezing Cardiovascular system: regular rate, s1, s2 Gastrointestinal system: Soft, nondistended, positive BS Central nervous system: CN2-12 grossly intact, strength intact Extremities: Perfused, no clubbing Skin: Normal skin turgor, no notable skin lesions seen Psychiatry: Mood normal // no visual hallucinations   Data Reviewed:  Labs reviewed: Na 135, K 4.0, Cr 1.03, WBC 10.5, Hgb 8.2   Family Communication:  Pt in room, family not at bedside  Disposition: Status is: Inpatient Remains inpatient appropriate because: Severity of illness  Planned Discharge Destination: Home    Author: Marylu Lund, MD 10/20/2022 1:23 PM  For on call review [email or web address].

## 2022-10-20 NOTE — Progress Notes (Signed)
2300: Patient arrived via [facility]. Vitals stable, reports minimal pain. Admission completed. Dual skin assessment. Educated on plan of care. No concerns voiced by patient at this time.  0645: Patient transported to MRI by transport.

## 2022-10-20 NOTE — Hospital Course (Signed)
Mr. Moraski is a 72 y.o. male with medical history significant for hypertension, alcohol abuse in remission, and chronic pancreatitis who presented with left upper quadrant abdominal pain, nausea, vomiting. He endorsed ongoing alcohol abstinence for approximately 6 months prior to hospitalization.  CTA on admission showed signs concerning for acute on chronic pancreatitis.  He was admitted and GI was also consulted due to elevated LFTs concerning for obstruction. MRCP was obtained which showed stable intra and extrahepatic biliary ductal dilatation with abrupt tapering of the pancreatic duct at the head of the pancreas but no discernible obstructing mass appreciated. 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

## 2022-10-21 DIAGNOSIS — K861 Other chronic pancreatitis: Secondary | ICD-10-CM | POA: Diagnosis not present

## 2022-10-21 LAB — CBC
HCT: 22.5 % — ABNORMAL LOW (ref 39.0–52.0)
Hemoglobin: 7.8 g/dL — ABNORMAL LOW (ref 13.0–17.0)
MCH: 34.1 pg — ABNORMAL HIGH (ref 26.0–34.0)
MCHC: 34.7 g/dL (ref 30.0–36.0)
MCV: 98.3 fL (ref 80.0–100.0)
Platelets: 263 10*3/uL (ref 150–400)
RBC: 2.29 MIL/uL — ABNORMAL LOW (ref 4.22–5.81)
RDW: 16.6 % — ABNORMAL HIGH (ref 11.5–15.5)
WBC: 9.5 10*3/uL (ref 4.0–10.5)
nRBC: 0 % (ref 0.0–0.2)

## 2022-10-21 LAB — COMPREHENSIVE METABOLIC PANEL
ALT: 34 U/L (ref 0–44)
AST: 39 U/L (ref 15–41)
Albumin: 1.8 g/dL — ABNORMAL LOW (ref 3.5–5.0)
Alkaline Phosphatase: 135 U/L — ABNORMAL HIGH (ref 38–126)
Anion gap: 8 (ref 5–15)
BUN: 19 mg/dL (ref 8–23)
CO2: 21 mmol/L — ABNORMAL LOW (ref 22–32)
Calcium: 8.1 mg/dL — ABNORMAL LOW (ref 8.9–10.3)
Chloride: 107 mmol/L (ref 98–111)
Creatinine, Ser: 0.91 mg/dL (ref 0.61–1.24)
GFR, Estimated: >60 mL/min (ref >60)
Glucose, Bld: 94 mg/dL (ref 70–99)
Potassium: 3.8 mmol/L (ref 3.5–5.1)
Sodium: 136 mmol/L (ref 135–145)
Total Bilirubin: 5 mg/dL — ABNORMAL HIGH (ref 0.3–1.2)
Total Protein: 5.4 g/dL — ABNORMAL LOW (ref 6.5–8.1)

## 2022-10-21 LAB — LIPASE, BLOOD: Lipase: 581 U/L — ABNORMAL HIGH (ref 11–51)

## 2022-10-21 NOTE — Progress Notes (Signed)
Subjective: Pain improving. Wants more to eat  Objective: Vital signs in last 24 hours: Temp:  [97.6 F (36.4 C)-98.6 F (37 C)] 97.6 F (36.4 C) (02/13 0800) Pulse Rate:  [69-80] 69 (02/13 0800) Resp:  [18-19] 18 (02/13 0437) BP: (110-131)/(75-84) 113/77 (02/13 0800) SpO2:  [92 %-95 %] 92 % (02/13 0800) Weight change:  Last BM Date : 10/17/22 (per patient)  PE: GEN:  NAD ABD:  Soft, very mild epigastric tenderness  Lab Results:  CBC    Component Value Date/Time   WBC 9.5 10/21/2022 0126   RBC 2.29 (L) 10/21/2022 0126   HGB 7.8 (L) 10/21/2022 0126   HCT 22.5 (L) 10/21/2022 0126   PLT 263 10/21/2022 0126   MCV 98.3 10/21/2022 0126   MCH 34.1 (H) 10/21/2022 0126   MCHC 34.7 10/21/2022 0126   RDW 16.6 (H) 10/21/2022 0126   LYMPHSABS 0.8 05/23/2021 1427   MONOABS 0.7 05/23/2021 1427   EOSABS 0.0 05/23/2021 1427   BASOSABS 0.0 05/23/2021 1427  CMP     Component Value Date/Time   NA 136 10/21/2022 0126   K 3.8 10/21/2022 0126   CL 107 10/21/2022 0126   CO2 21 (L) 10/21/2022 0126   GLUCOSE 94 10/21/2022 0126   BUN 19 10/21/2022 0126   CREATININE 0.91 10/21/2022 0126   CALCIUM 8.1 (L) 10/21/2022 0126   PROT 5.4 (L) 10/21/2022 0126   ALBUMIN 1.8 (L) 10/21/2022 0126   AST 39 10/21/2022 0126   ALT 34 10/21/2022 0126   ALKPHOS 135 (H) 10/21/2022 0126   BILITOT 5.0 (H) 10/21/2022 0126   GFRNONAA >60 10/21/2022 0126   GFRAA >60 04/11/2020 0523   Assessment:   Acute on chronic pancreatitis. Alcohol abuse, abstinent x 6 months. Tobacco abuse, ongoing. Pancreatic fluid collections on imaging. Abdominal pain, improving. Elevated LFTs, improving.  Plan:   Advance diet as tolerated. Alcohol and tobacco abstinence advised for innumerable reasons, but specifically for mitigating ongoing issues with pancreatitis. Likely repeat cross-sectional imaging in 3-4 months to reassess peripancreatic fluid collections. Watch LFTs. [facility] will follow along at a distance;  suspect he will be able to be discharged in the next 1-2 days.   VONTEZ, QUEBEDEAUX 10/21/2022, 2:30 PM   Cell [phone] If no answer or after 5 PM call [phone]

## 2022-10-21 NOTE — Progress Notes (Signed)
  Progress Note   Patient: Jared Duran [id number] DOB: 1950-12-25 DOA: 10/19/2022     2 DOS: the patient was seen and examined on 10/21/2022   Brief hospital course: 72 y.o. male with medical history significant for hypertension, alcohol abuse in remission, and chronic pancreatitis, now presenting with left upper quadrant abdominal pain, nausea, and vomiting. Pt was admitted for acute pancreatitis  Assessment and Plan: 1. Acute on chronic pancreatitis; biliary obstruction  - Presents with LUQ abdominal pain and N/V and found to have lipase 517, mild transaminase elevations, t bili 7.7, and RUQ [place] with gallbladder sludge and CBD dilatation  -MRCP reviewed. Stable intra and extrahpeatic biliary dilitation. GB sludge without stones, findings of likely pseudocysts on imaging -Feeling somewhat better today, but still having some LUQ pains -GI following, appreciate recs. Currently on clears. Defer to GI regarding advancing further   2. HTN  - BP low-normal in ED and antihypertensives held on admission     3. Hx of alcohol abuse  - Reports strict abstinence for months       Subjective: Tolerating clear liquids this AM. Is having some LUQ discomfort still  Physical Exam: Vitals:   10/20/22 1700 10/20/22 1930 10/21/22 0437 10/21/22 0800  BP: 110/75 121/75 131/84 113/77  Pulse: 75 73 80 69  Resp:  19 18   Temp: 98.1 F (36.7 C) 97.6 F (36.4 C) 98.6 F (37 C) 97.6 F (36.4 C)  TempSrc: Oral Oral  Oral  SpO2: 95% 93% 94% 92%  Weight:      Height:       General exam: Conversant, in no acute distress Respiratory system: normal chest rise, clear, no audible wheezing Cardiovascular system: regular rhythm, s1-s2 Gastrointestinal system: Nondistended, pos BS Central nervous system: No seizures, no tremors Extremities: No cyanosis, no joint deformities Skin: No rashes, no pallor Psychiatry: Affect normal // no auditory hallucinations   Data Reviewed:  Labs reviewed: Na 136, K  3.8, Cr 0.91, WBC 9.5, Hgb 7.8   Family Communication: Pt in room, family over phone  Disposition: Status is: Inpatient Remains inpatient appropriate because: Severity of illness  Planned Discharge Destination: Home    Author: Marylu Lund, MD 10/21/2022 12:12 PM  For on call review [email or web address].

## 2022-10-22 DIAGNOSIS — K851 Biliary acute pancreatitis without necrosis or infection: Secondary | ICD-10-CM | POA: Diagnosis not present

## 2022-10-22 DIAGNOSIS — R7989 Other specified abnormal findings of blood chemistry: Secondary | ICD-10-CM | POA: Diagnosis not present

## 2022-10-22 LAB — COMPREHENSIVE METABOLIC PANEL
ALT: 38 U/L (ref 0–44)
AST: 53 U/L — ABNORMAL HIGH (ref 15–41)
Albumin: 1.8 g/dL — ABNORMAL LOW (ref 3.5–5.0)
Alkaline Phosphatase: 146 U/L — ABNORMAL HIGH (ref 38–126)
Anion gap: 10 (ref 5–15)
BUN: 13 mg/dL (ref 8–23)
CO2: 23 mmol/L (ref 22–32)
Calcium: 8.2 mg/dL — ABNORMAL LOW (ref 8.9–10.3)
Chloride: 101 mmol/L (ref 98–111)
Creatinine, Ser: 0.88 mg/dL (ref 0.61–1.24)
GFR, Estimated: >60 mL/min (ref >60)
Glucose, Bld: 104 mg/dL — ABNORMAL HIGH (ref 70–99)
Potassium: 3.5 mmol/L (ref 3.5–5.1)
Sodium: 134 mmol/L — ABNORMAL LOW (ref 135–145)
Total Bilirubin: 4.5 mg/dL — ABNORMAL HIGH (ref 0.3–1.2)
Total Protein: 5.5 g/dL — ABNORMAL LOW (ref 6.5–8.1)

## 2022-10-22 LAB — CBC
HCT: 23.1 % — ABNORMAL LOW (ref 39.0–52.0)
Hemoglobin: 7.8 g/dL — ABNORMAL LOW (ref 13.0–17.0)
MCH: 33.2 pg (ref 26.0–34.0)
MCHC: 33.8 g/dL (ref 30.0–36.0)
MCV: 98.3 fL (ref 80.0–100.0)
Platelets: 298 10*3/uL (ref 150–400)
RBC: 2.35 MIL/uL — ABNORMAL LOW (ref 4.22–5.81)
RDW: 16 % — ABNORMAL HIGH (ref 11.5–15.5)
WBC: 9.3 10*3/uL (ref 4.0–10.5)
nRBC: 0 % (ref 0.0–0.2)

## 2022-10-22 NOTE — Plan of Care (Signed)

## 2022-10-22 NOTE — Progress Notes (Signed)
Progress Note    Jared Duran   [id number]  DOB: 02-Jun-1951  DOA: 10/19/2022     3 PCP: [name], MD  Initial CC: abd pain, N/V  Hospital Course: Jared Duran is a 72 y.o. male with medical history significant for hypertension, alcohol abuse in remission, and chronic pancreatitis who presented with left upper quadrant abdominal pain, nausea, vomiting. He endorsed ongoing alcohol abstinence for approximately 6 months prior to hospitalization.  CTA on admission showed signs concerning for acute on chronic pancreatitis.  He was admitted and GI was also consulted due to elevated LFTs concerning for obstruction. MRCP was obtained which showed stable intra and extrahepatic biliary ductal dilatation with abrupt tapering of the pancreatic duct at the head of the pancreas but no discernible obstructing mass appreciated. 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  Interval History:  No events overnight.  Abdominal pain has continued to improve.  He is tolerating diet as well.  Assessment and Plan:  Acute on chronic pancreatitis; biliary obstruction  - Presents with LUQ abdominal pain and N/V and found to have lipase 517, mild transaminase elevations, t bili 7.7, and RUQ [place] with gallbladder sludge and CBD dilatation  -MRCP reviewed. Stable intra and extrahpeatic biliary dilitation. GB sludge without stones, findings of likely pseudocysts on imaging; low suspicion for underlying abscess given ongoing clinical improvement -Continues to clinically improve - LFTs slowly downtrending.  Watch 1 more night.  Hopeful for discharge tomorrow   HTN  - BP low-normal in ED and antihypertensives held on admission     Hx of alcohol abuse  - Reports strict abstinence for months    Old records reviewed in assessment of this patient  Antimicrobials:   DVT prophylaxis:  enoxaparin (LOVENOX) injection 40 mg Start: 10/20/22 1000   Code Status:   Code Status: Full  Code  Mobility Assessment (last 72 hours)     Mobility Assessment     Row Name 10/19/22 2300           Does patient have an order for bedrest or is patient medically unstable No - Continue assessment       What is the highest level of mobility based on the progressive mobility assessment? Level 5 (Walks with assist in room/hall) - Balance while stepping forward/back and can walk in room with assist - Complete                Barriers to discharge:  Disposition Plan:  Home Thus Status is: Inpt  Objective: Blood pressure 139/81, pulse 72, temperature 97.9 F (36.6 C), temperature source Oral, resp. rate 18, height 5' 7"$  (1.702 m), weight 62 kg, SpO2 98 %.  Examination:  Physical Exam Constitutional:      General: He is not in acute distress.    Appearance: Normal appearance.  HENT:     Head: Normocephalic and atraumatic.     Mouth/Throat:     Mouth: Mucous membranes are moist.  Eyes:     Extraocular Movements: Extraocular movements intact.  Cardiovascular:     Rate and Rhythm: Normal rate and regular rhythm.     Heart sounds: Normal heart sounds.  Pulmonary:     Effort: Pulmonary effort is normal. No respiratory distress.     Breath sounds: Normal breath sounds. No wheezing.  Abdominal:     General: Bowel sounds are normal. There is no distension.     Palpations: Abdomen is soft.     Tenderness: There is abdominal tenderness  in the left upper quadrant. There is no guarding or rebound.  Musculoskeletal:        General: Normal range of motion.     Cervical back: Normal range of motion and neck supple.  Skin:    General: Skin is warm and dry.  Neurological:     General: No focal deficit present.     Mental Status: He is alert.  Psychiatric:        Mood and Affect: Mood normal.        Behavior: Behavior normal.      Consultants:  GI  Procedures:    Data Reviewed: Results for orders placed or performed during the hospital encounter of 10/19/22 (from the  past 24 hour(s))  Comprehensive metabolic panel     Status: Abnormal   Collection Time: 10/22/22  2:09 AM  Result Value Ref Range   Sodium 134 (L) 135 - 145 mmol/L   Potassium 3.5 3.5 - 5.1 mmol/L   Chloride 101 98 - 111 mmol/L   CO2 23 22 - 32 mmol/L   Glucose, Bld 104 (H) 70 - 99 mg/dL   BUN 13 8 - 23 mg/dL   Creatinine, Ser 0.88 0.61 - 1.24 mg/dL   Calcium 8.2 (L) 8.9 - 10.3 mg/dL   Total Protein 5.5 (L) 6.5 - 8.1 g/dL   Albumin 1.8 (L) 3.5 - 5.0 g/dL   AST 53 (H) 15 - 41 U/L   ALT 38 0 - 44 U/L   Alkaline Phosphatase 146 (H) 38 - 126 U/L   Total Bilirubin 4.5 (H) 0.3 - 1.2 mg/dL   GFR, Estimated >60 >60 mL/min   Anion gap 10 5 - 15  CBC     Status: Abnormal   Collection Time: 10/22/22  2:09 AM  Result Value Ref Range   WBC 9.3 4.0 - 10.5 K/uL   RBC 2.35 (L) 4.22 - 5.81 MIL/uL   Hemoglobin 7.8 (L) 13.0 - 17.0 g/dL   HCT 23.1 (L) 39.0 - 52.0 %   MCV 98.3 80.0 - 100.0 fL   MCH 33.2 26.0 - 34.0 pg   MCHC 33.8 30.0 - 36.0 g/dL   RDW 16.0 (H) 11.5 - 15.5 %   Platelets 298 150 - 400 K/uL   nRBC 0.0 0.0 - 0.2 %    I have reviewed pertinent nursing notes, vitals, labs, and images as necessary. I have ordered labwork to follow up on as indicated.  I have reviewed the last notes from staff over past 24 hours. I have discussed patient's care plan and test results with nursing staff, CM/SW, and other staff as appropriate.  Time spent: Greater than 50% of the 55 minute visit was spent in counseling/coordination of care for the patient as laid out in the A&P.   LOS: 3 days   Dwyane Dee, MD [facility] 10/22/2022, 11:35 AM

## 2022-10-23 DIAGNOSIS — K851 Biliary acute pancreatitis without necrosis or infection: Secondary | ICD-10-CM | POA: Diagnosis not present

## 2022-10-23 DIAGNOSIS — I10 Essential (primary) hypertension: Secondary | ICD-10-CM | POA: Diagnosis not present

## 2022-10-23 LAB — CBC WITH DIFFERENTIAL/PLATELET
Abs Immature Granulocytes: 0.13 10*3/uL — ABNORMAL HIGH (ref 0.00–0.07)
Basophils Absolute: 0 10*3/uL (ref 0.0–0.1)
Basophils Relative: 0 %
Eosinophils Absolute: 0.2 10*3/uL (ref 0.0–0.5)
Eosinophils Relative: 2 %
HCT: 22.4 % — ABNORMAL LOW (ref 39.0–52.0)
Hemoglobin: 7.5 g/dL — ABNORMAL LOW (ref 13.0–17.0)
Immature Granulocytes: 1 %
Lymphocytes Relative: 19 %
Lymphs Abs: 1.9 10*3/uL (ref 0.7–4.0)
MCH: 32.8 pg (ref 26.0–34.0)
MCHC: 33.5 g/dL (ref 30.0–36.0)
MCV: 97.8 fL (ref 80.0–100.0)
Monocytes Absolute: 1 10*3/uL (ref 0.1–1.0)
Monocytes Relative: 11 %
Neutro Abs: 6.3 10*3/uL (ref 1.7–7.7)
Neutrophils Relative %: 67 %
Platelets: 290 10*3/uL (ref 150–400)
RBC: 2.29 MIL/uL — ABNORMAL LOW (ref 4.22–5.81)
RDW: 16.1 % — ABNORMAL HIGH (ref 11.5–15.5)
WBC: 9.6 10*3/uL (ref 4.0–10.5)
nRBC: 0 % (ref 0.0–0.2)

## 2022-10-23 LAB — COMPREHENSIVE METABOLIC PANEL
ALT: 36 U/L (ref 0–44)
AST: 46 U/L — ABNORMAL HIGH (ref 15–41)
Albumin: 1.8 g/dL — ABNORMAL LOW (ref 3.5–5.0)
Alkaline Phosphatase: 137 U/L — ABNORMAL HIGH (ref 38–126)
Anion gap: 6 (ref 5–15)
BUN: 8 mg/dL (ref 8–23)
CO2: 23 mmol/L (ref 22–32)
Calcium: 7.8 mg/dL — ABNORMAL LOW (ref 8.9–10.3)
Chloride: 102 mmol/L (ref 98–111)
Creatinine, Ser: 0.85 mg/dL (ref 0.61–1.24)
GFR, Estimated: >60 mL/min (ref >60)
Glucose, Bld: 110 mg/dL — ABNORMAL HIGH (ref 70–99)
Potassium: 3.2 mmol/L — ABNORMAL LOW (ref 3.5–5.1)
Sodium: 131 mmol/L — ABNORMAL LOW (ref 135–145)
Total Bilirubin: 3.7 mg/dL — ABNORMAL HIGH (ref 0.3–1.2)
Total Protein: 5.4 g/dL — ABNORMAL LOW (ref 6.5–8.1)

## 2022-10-23 LAB — MAGNESIUM: Magnesium: 1.8 mg/dL (ref 1.7–2.4)

## 2022-10-23 MED ORDER — OXYCODONE HCL 5 MG PO TABS: 5.0000 mg | ORAL_TABLET | Freq: Four times a day (QID) | ORAL | 0 refills | Status: DC | PRN

## 2022-10-23 MED ORDER — POTASSIUM CHLORIDE CRYS ER 20 MEQ PO TBCR
40.0000 meq | EXTENDED_RELEASE_TABLET | Freq: Once | ORAL | Status: DC
  Filled 2022-10-23: qty 2

## 2022-10-23 NOTE — Discharge Summary (Signed)
Physician Discharge Summary   Jared Duran [id number] DOB: Sep 19, 1950 DOA: 10/19/2022  PCP: [name], MD  Admit date: 10/19/2022 Discharge date: 10/23/2022   Admitted From: Home Disposition:  Home Discharging physician: Dwyane Dee, MD Barriers to discharge: none  Recommendations for Outpatient Follow-up:  Follow-up with GI for repeat imaging in 3 to 4 months  Home Health:  Equipment/Devices:   Discharge Condition: stable CODE STATUS: Full Diet recommendation:  Diet Orders (From admission, onward)     Start     Ordered   10/21/22 1755  DIET SOFT Room service appropriate? Yes; Fluid consistency: Thin  Diet effective now       Question Answer Comment  Room service appropriate? Yes   Fluid consistency: Thin      10/21/22 [facility] Course: Mr. Gaddis is a 72 y.o. male with medical history significant for hypertension, alcohol abuse in remission, and chronic pancreatitis who presented with left upper quadrant abdominal pain, nausea, vomiting. He endorsed ongoing alcohol abstinence for approximately 6 months prior to hospitalization.  CTA on admission showed signs concerning for acute on chronic pancreatitis.  He was admitted and GI was also consulted due to elevated LFTs concerning for obstruction. MRCP was obtained which showed stable intra and extrahepatic biliary ductal dilatation with abrupt tapering of the pancreatic duct at the head of the pancreas but no discernible obstructing mass appreciated. 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  Assessment and Plan:  Acute on chronic pancreatitis; biliary obstruction  - Presents with LUQ abdominal pain and N/V and found to have lipase 517, mild transaminase elevations, t bili 7.7, and RUQ [place] with gallbladder sludge and CBD dilatation  -MRCP reviewed. Stable intra and extrahpeatic biliary dilitation. GB sludge without stones, findings of likely pseudocysts on imaging; low  suspicion for underlying abscess given ongoing clinical improvement - LFTs still improving at discharge   HTN  -Home regimen resumed at discharge   Hx of alcohol abuse  - Reports strict abstinence for months     The patient's chronic medical conditions were treated accordingly per the patient's home medication regimen except as noted.  On day of discharge, patient was felt deemed stable for discharge. Patient/family member advised to call PCP or come back to ER if needed.   Principal Diagnosis: Acute pancreatitis  Discharge Diagnoses: Active Hospital Problems   Diagnosis Date Noted   Acute pancreatitis 04/10/2020   Elevated LFTs 10/19/2022   Biliary obstruction 10/19/2022   Alcohol abuse, in remission 10/19/2022   Benign essential HTN 04/12/2020    Resolved Hospital Problems  No resolved problems to display.     Discharge Instructions     Increase activity slowly   Complete by: As directed       Allergies as of 10/23/2022   No Known Allergies      Medication List     TAKE these medications    hydrOXYzine 25 MG tablet Commonly known as: ATARAX Take 25 mg by mouth 3 (three) times daily as needed for anxiety or itching.   losartan 100 MG tablet Commonly known as: COZAAR Take 100 mg by mouth daily.   oxyCODONE 5 MG immediate release tablet Commonly known as: Roxicodone Take 1 tablet (5 mg total) by mouth every 6 (six) hours as needed.   pantoprazole 20 MG tablet Commonly known as: PROTONIX Take 20 mg by mouth daily. What changed: Another medication with the same name was removed.  Continue taking this medication, and follow the directions you see here.   sertraline 25 MG tablet Commonly known as: ZOLOFT Take 25 mg by mouth at bedtime.        Follow-up Information     [name], MD. Schedule an appointment as soon as possible for a visit in 2 week(s).   Specialty: Gastroenterology Contact information: [id number]  [place] [address] [phone]                No Known Allergies  Consultations: GI  Procedures:   Discharge Exam: BP (!) 152/89   Pulse (!) 59   Temp 98.4 F (36.9 C) (Oral)   Resp 16   Ht 5' 7"$  (1.702 m)   Wt 62 kg   SpO2 97%   BMI 21.41 kg/m  Physical Exam Constitutional:      General: He is not in acute distress.    Appearance: Normal appearance.  HENT:     Head: Normocephalic and atraumatic.     Mouth/Throat:     Mouth: Mucous membranes are moist.  Eyes:     Extraocular Movements: Extraocular movements intact.  Cardiovascular:     Rate and Rhythm: Normal rate and regular rhythm.     Heart sounds: Normal heart sounds.  Pulmonary:     Effort: Pulmonary effort is normal. No respiratory distress.     Breath sounds: Normal breath sounds. No wheezing.  Abdominal:     General: Bowel sounds are normal. There is no distension.     Palpations: Abdomen is soft.     Tenderness: There is no abdominal tenderness. There is no guarding or rebound.  Musculoskeletal:        General: Normal range of motion.     Cervical back: Normal range of motion and neck supple.  Skin:    General: Skin is warm and dry.  Neurological:     General: No focal deficit present.     Mental Status: He is alert.  Psychiatric:        Mood and Affect: Mood normal.        Behavior: Behavior normal.      The results of significant diagnostics from this hospitalization (including imaging, microbiology, ancillary and laboratory) are listed below for reference.   Microbiology: No results found for this or any previous visit (from the past 240 hour(s)).   Labs: BNP (last 3 results) No results for input(s): "BNP" in the last 8760 hours. Basic Metabolic Panel: Recent Labs  Lab 10/19/22 1301 10/20/22 0507 10/21/22 0126 10/22/22 0209 10/23/22 0032  NA 132* 135 136 134* 131*  K 3.8 4.0 3.8 3.5 3.2*  CL 102 104 107 101 102  CO2 22 22 21* 23 23  GLUCOSE 113* 91 94 104* 110*  BUN 31* 28* 19 13  8  $ CREATININE 1.15 1.03 0.91 0.88 0.85  CALCIUM 8.6* 8.2* 8.1* 8.2* 7.8*  MG  --  1.9  --   --  1.8   Liver Function Tests: Recent Labs  Lab 10/19/22 1524 10/20/22 0507 10/21/22 0126 10/22/22 0209 10/23/22 0032  AST 55* 38 39 53* 46*  ALT 50* 35 34 38 36  ALKPHOS 190* 142* 135* 146* 137*  BILITOT 7.7* 6.0* 5.0* 4.5* 3.7*  PROT 7.3 5.5* 5.4* 5.5* 5.4*  ALBUMIN 2.5* 1.8* 1.8* 1.8* 1.8*   Recent Labs  Lab 10/19/22 1524 10/21/22 0126  LIPASE 517* 581*   No results for input(s): "AMMONIA" in the last 168 hours.  CBC: Recent Labs  Lab 10/19/22 1301 10/20/22 0507 10/21/22 0126 10/22/22 0209 10/23/22 0032  WBC 13.5* 10.5 9.5 9.3 9.6  NEUTROABS  --   --   --   --  6.3  HGB 10.6* 8.2* 7.8* 7.8* 7.5*  HCT 31.9* 24.3* 22.5* 23.1* 22.4*  MCV 99.1 98.4 98.3 98.3 97.8  PLT 334 261 263 298 290   Cardiac Enzymes: No results for input(s): "CKTOTAL", "CKMB", "CKMBINDEX", "TROPONINI" in the last 168 hours. BNP: Invalid input(s): "POCBNP" CBG: No results for input(s): "GLUCAP" in the last 168 hours. D-Dimer No results for input(s): "DDIMER" in the last 72 hours. Hgb A1c No results for input(s): "HGBA1C" in the last 72 hours. Lipid Profile No results for input(s): "CHOL", "HDL", "LDLCALC", "TRIG", "CHOLHDL", "LDLDIRECT" in the last 72 hours. Thyroid function studies No results for input(s): "TSH", "T4TOTAL", "T3FREE", "THYROIDAB" in the last 72 hours.  Invalid input(s): "FREET3" Anemia work up No results for input(s): "VITAMINB12", "FOLATE", "FERRITIN", "TIBC", "IRON", "RETICCTPCT" in the last 72 hours. Urinalysis    Component Value Date/Time   COLORURINE AMBER (A) 10/19/2022 2147   APPEARANCEUR CLEAR 10/19/2022 2147   LABSPEC <=1.005 10/19/2022 2147   PHURINE 6.0 10/19/2022 2147   GLUCOSEU 100 (A) 10/19/2022 2147   HGBUR NEGATIVE 10/19/2022 2147   BILIRUBINUR LARGE (A) 10/19/2022 2147   [place] NEGATIVE 10/19/2022 2147   PROTEINUR 100 (A) 10/19/2022 2147   NITRITE  NEGATIVE 10/19/2022 2147   LEUKOCYTESUR NEGATIVE 10/19/2022 2147   Sepsis Labs Recent Labs  Lab 10/20/22 0507 10/21/22 0126 10/22/22 0209 10/23/22 0032  WBC 10.5 9.5 9.3 9.6   Microbiology No results found for this or any previous visit (from the past 240 hour(s)).  Procedures/Studies: MR ABDOMEN MRCP W WO CONTAST  Result Date: 10/20/2022 CLINICAL DATA:  Sequelae of chronic pancreatitis on recent CT. EXAM: MRI ABDOMEN WITHOUT AND WITH CONTRAST (INCLUDING MRCP) TECHNIQUE: Multiplanar multisequence MR imaging of the abdomen was performed both before and after the administration of intravenous contrast. Heavily T2-weighted images of the biliary and pancreatic ducts were obtained, and three-dimensional MRCP images were rendered by post processing. CONTRAST:  42m GADAVIST GADOBUTROL 1 MMOL/ML IV SOLN COMPARISON:  CTA 10/19/2022.  MRI/MRCP 02/10/2021 FINDINGS: Lower chest: Left base atelectasis with left pleural effusion. Hepatobiliary: 6 mm cyst noted in the dome of the liver with 9 mm simple cyst in the anterior right liver. No followup imaging is recommended. Gallbladder is distended with some dependent sludge but no stones evident. Intra and extrahepatic biliary duct dilatation is similar to previous MRI. Common duct measured previously at 12 mm is 11 mm today. Common bile duct just proximal to the ampulla is 10 mm today compared to 10 mm previously. As noted previously, there is abrupt tapering of the pancreatic duct in the head of the pancreas. No evidence for choledocholithiasis. Pancreas: Pancreatic head is prominent although the marked calcification seen on CT is not evident on MRI. 13 mm simple appearing cyst noted in the anterior pancreatic head, new in the interval since previous MRI. A second cyst in the tail of the pancreas measures 3.5 cm, showing thin smooth rim enhancement and no evidence for internal septation although there is some debris within the collection. Cyst is relatively  homogeneous and low signal intensity on T1 with high signal intensity on T2 imaging. No main duct dilatation in the pancreas. Spleen: No splenomegaly. No focal mass lesion. Small collection of fluid posterior to the spleen may be loculated. There is a 4.5 x 2.0  cm rim enhancing collection lateral to the spleen that extends up into the lateral aspect of the subdiaphragmatic space. Adrenals/Urinary Tract: No adrenal nodule or mass. Simple cyst noted upper pole right kidney. No followup imaging is recommended. No suspicious abnormality in the left kidney. No hydronephrosis. Stomach/Bowel: Stomach is unremarkable. No gastric wall thickening. No evidence of outlet obstruction. Duodenum is normally positioned as is the ligament of Treitz. Thickening in jejunal small bowel loops of the left upper quadrant is likely secondary. Vascular/Lymphatic: No abdominal aortic aneurysm. Small hepatoduodenal ligament and retroperitoneal lymph nodes evident. Other: Small volume ascites noted with mesenteric edema. 5.4 x 1.8 cm loculated rim enhancing collections identified lateral to the splenic flexure of the colon. Musculoskeletal: No focal suspicious marrow enhancement within the visualized bony anatomy. IMPRESSION: 1. Stable intra and extrahepatic biliary duct dilatation with abrupt tapering of the pancreatic duct in the head of the pancreas. No evidence for choledocholithiasis. No discernible obstructing mass lesion. 2. The marked pancreatic parenchymal calcification seen on CT is not evident on MRI. No main duct dilatation in the pancreas. 3. 3.5 cm cyst in the tail of the pancreas showing thin smooth rim enhancement and some debris within the collection. A second 13 mm cyst identified in the head of the pancreas. These are likely pseudocysts although superinfection cannot be excluded. 4. 4.5 x 2.0 cm rim enhancing fluid collection identified lateral to the spleen with probable loculated fluid posterior to the spleen as well. An  additional 5.4 x 1.8 cm loculated rim enhancing collection identified lateral to the splenic flexure of the colon. These may represent pseudocysts although abscess is a possibility. 5. Distended gallbladder with layering sludge. No gallbladder wall thickening or evidence of gallstones. 6. Small volume ascites with mesenteric edema. 7. Left base atelectasis with left pleural effusion. 8. Thickening in jejunal small bowel loops of the left upper quadrant is likely secondary. Electronically Signed   By: Misty Stanley M.D.   On: 10/20/2022 08:40   MR 3D Recon At Scanner  Result Date: 10/20/2022 CLINICAL DATA:  Sequelae of chronic pancreatitis on recent CT. EXAM: MRI ABDOMEN WITHOUT AND WITH CONTRAST (INCLUDING MRCP) TECHNIQUE: Multiplanar multisequence MR imaging of the abdomen was performed both before and after the administration of intravenous contrast. Heavily T2-weighted images of the biliary and pancreatic ducts were obtained, and three-dimensional MRCP images were rendered by post processing. CONTRAST:  47m GADAVIST GADOBUTROL 1 MMOL/ML IV SOLN COMPARISON:  CTA 10/19/2022.  MRI/MRCP 02/10/2021 FINDINGS: Lower chest: Left base atelectasis with left pleural effusion. Hepatobiliary: 6 mm cyst noted in the dome of the liver with 9 mm simple cyst in the anterior right liver. No followup imaging is recommended. Gallbladder is distended with some dependent sludge but no stones evident. Intra and extrahepatic biliary duct dilatation is similar to previous MRI. Common duct measured previously at 12 mm is 11 mm today. Common bile duct just proximal to the ampulla is 10 mm today compared to 10 mm previously. As noted previously, there is abrupt tapering of the pancreatic duct in the head of the pancreas. No evidence for choledocholithiasis. Pancreas: Pancreatic head is prominent although the marked calcification seen on CT is not evident on MRI. 13 mm simple appearing cyst noted in the anterior pancreatic head, new in  the interval since previous MRI. A second cyst in the tail of the pancreas measures 3.5 cm, showing thin smooth rim enhancement and no evidence for internal septation although there is some debris within the collection. Cyst is relatively  homogeneous and low signal intensity on T1 with high signal intensity on T2 imaging. No main duct dilatation in the pancreas. Spleen: No splenomegaly. No focal mass lesion. Small collection of fluid posterior to the spleen may be loculated. There is a 4.5 x 2.0 cm rim enhancing collection lateral to the spleen that extends up into the lateral aspect of the subdiaphragmatic space. Adrenals/Urinary Tract: No adrenal nodule or mass. Simple cyst noted upper pole right kidney. No followup imaging is recommended. No suspicious abnormality in the left kidney. No hydronephrosis. Stomach/Bowel: Stomach is unremarkable. No gastric wall thickening. No evidence of outlet obstruction. Duodenum is normally positioned as is the ligament of Treitz. Thickening in jejunal small bowel loops of the left upper quadrant is likely secondary. Vascular/Lymphatic: No abdominal aortic aneurysm. Small hepatoduodenal ligament and retroperitoneal lymph nodes evident. Other: Small volume ascites noted with mesenteric edema. 5.4 x 1.8 cm loculated rim enhancing collections identified lateral to the splenic flexure of the colon. Musculoskeletal: No focal suspicious marrow enhancement within the visualized bony anatomy. IMPRESSION: 1. Stable intra and extrahepatic biliary duct dilatation with abrupt tapering of the pancreatic duct in the head of the pancreas. No evidence for choledocholithiasis. No discernible obstructing mass lesion. 2. The marked pancreatic parenchymal calcification seen on CT is not evident on MRI. No main duct dilatation in the pancreas. 3. 3.5 cm cyst in the tail of the pancreas showing thin smooth rim enhancement and some debris within the collection. A second 13 mm cyst identified in the  head of the pancreas. These are likely pseudocysts although superinfection cannot be excluded. 4. 4.5 x 2.0 cm rim enhancing fluid collection identified lateral to the spleen with probable loculated fluid posterior to the spleen as well. An additional 5.4 x 1.8 cm loculated rim enhancing collection identified lateral to the splenic flexure of the colon. These may represent pseudocysts although abscess is a possibility. 5. Distended gallbladder with layering sludge. No gallbladder wall thickening or evidence of gallstones. 6. Small volume ascites with mesenteric edema. 7. Left base atelectasis with left pleural effusion. 8. Thickening in jejunal small bowel loops of the left upper quadrant is likely secondary. Electronically Signed   By: Misty Stanley M.D.   On: 10/20/2022 08:40   US Abdomen Limited RUQ (LIVER/GB)  Result Date: 10/19/2022 CLINICAL DATA:  Epigastric pain EXAM: ULTRASOUND ABDOMEN LIMITED RIGHT UPPER QUADRANT COMPARISON:  CT angiogram chest abdomen and pelvis 10/19/2022 FINDINGS: Gallbladder: No gallstones or wall thickening visualized. Gallbladder is dilated. Gallbladder sludge is present. No sonographic Murphy sign noted by sonographer. Common bile duct: Diameter: 10 mm. There is also mild-to-moderate intrahepatic biliary ductal dilatation. Liver: Two hepatic cysts are identified. Right lobe cyst measures 11 x 10 x 11 mm. Left lobe cyst measures 11 x 7 x 8 mm. Within normal limits in parenchymal echogenicity. Portal vein is patent on color Doppler imaging with normal direction of blood flow towards the liver. Other: Incidental right renal simple cyst measuring 2.2 x 1.7 x 3.4 cm. IMPRESSION: 1. Gallbladder hydrops with gallbladder sludge. No gallstones or wall thickening. 2. Dilated common bile duct with mild-to-moderate intrahepatic biliary ductal dilatation. Recommend further evaluation with MRCP. Electronically Signed   By: Ronney Asters M.D.   On: 10/19/2022 21:06   CT Angio Chest/Abd/Pel  for Dissection W and/or W/WO  Result Date: 10/19/2022 CLINICAL DATA:  Concern for aerated aneurysm. Left-sided chest pain. EXAM: CT ANGIOGRAPHY CHEST, ABDOMEN AND PELVIS TECHNIQUE: Non-contrast CT of the chest was initially obtained. Multidetector CT imaging through  the chest, abdomen and pelvis was performed using the standard protocol during bolus administration of intravenous contrast. Multiplanar reconstructed images and MIPs were obtained and reviewed to evaluate the vascular anatomy. RADIATION DOSE REDUCTION: This exam was performed according to the departmental dose-optimization program which includes automated exposure control, adjustment of the mA and/or kV according to patient size and/or use of iterative reconstruction technique. CONTRAST:  142m OMNIPAQUE IOHEXOL 350 MG/ML SOLN COMPARISON:  CT abdomen pelvis dated 01/04/2022. FINDINGS: Evaluation is limited due to streak artifact caused by patient's arms. Evaluation is also limited due to anasarca and paucity of intra-abdominal fat. CTA CHEST FINDINGS Cardiovascular: There is no cardiomegaly or pericardial effusion. There is mild atherosclerotic calcification of the thoracic aorta. No aneurysmal dilatation or dissection. The aorta is tortuous. The origins of the great vessels of the aortic arch appear patent. No pulmonary artery embolus identified. Mediastinum/Nodes: No hilar or mediastinal adenopathy. The esophagus is grossly unremarkable. No mediastinal fluid collection. Lungs/Pleura: Small left pleural effusion with partial compressive atelectasis of the left versus pneumonia. There is background of centrilobular emphysema. There is no pneumothorax. The central airways are patent. Musculoskeletal: Degenerative changes of the spine. No acute osseous pathology. Review of the MIP images confirms the above findings. CTA ABDOMEN AND PELVIS FINDINGS VASCULAR Aorta: Mild atherosclerotic calcification of the abdominal aorta. No aneurysmal dilatation or  dissection. Celiac: Patent without evidence of aneurysm, dissection, vasculitis or significant stenosis. SMA: Patent without evidence of aneurysm, dissection, vasculitis or significant stenosis. Renals: Both renal arteries are patent without evidence of aneurysm, dissection, vasculitis, fibromuscular dysplasia or significant stenosis. IMA: Patent without evidence of aneurysm, dissection, vasculitis or significant stenosis. Inflow: Mild atherosclerotic calcification of the iliac arteries. The iliac arteries are patent. No aneurysmal dilatation or dissection. Veins: No obvious venous abnormality within the limitations of this arterial phase study. Review of the MIP images confirms the above findings. NON-VASCULAR No intra-abdominal free air.  Small free fluid within the pelvis. Hepatobiliary: Several small liver cysts, too small to characterize. There is mild dilatation of the biliary tree. No calcified stone within the gallbladder. Pancreas: There is coarse calcification of the head and uncinate process of the pancreas sequela of chronic pancreatitis. Correlation with pancreatic enzymes recommended to evaluate for for possibility of acute on chronic pancreatitis. There is a 3.3 x 2.6 cm cyst in the tail of the pancreas most consistent with a pseudocyst. Attention on follow-up imaging recommended. Spleen: The spleen is unremarkable. There is perisplenic free fluid which appears somewhat loculated, likely sequela of pancreatitis. This fluid may represent a pseudocyst. Adrenals/Urinary Tract: The adrenal glands are poorly visualized. There is a small right renal upper pole cyst. There is no hydronephrosis on either side. The urinary bladder is grossly unremarkable. Stomach/Bowel: There is moderate stool throughout the colon. There is no bowel obstruction. Lymphatic: No adenopathy. Reproductive: The prostate gland is mildly enlarged measuring 4.5 cm in transverse axial diameter. The seminal vesicles are symmetric. Other:  Diffuse mesenteric and subcutaneous edema and anasarca. Musculoskeletal: Degenerative changes of the spine. No acute osseous pathology. Review of the MIP images confirms the above findings. IMPRESSION: 1. No aortic aneurysm or dissection. 2. Small left pleural effusion with partial compressive atelectasis of the left versus pneumonia. 3. Sequela of chronic pancreatitis including pancreatic head calcification pancreatic tail pseudocyst. Correlation with pancreatic enzymes recommended to evaluate for possibility of acute on chronic pancreatitis. 4. Perisplenic fluid collection most consistent with post inflammatory collection or developing pseudocysts. An abscess sign report cyst is not excluded.  5. No bowel obstruction. 6. Aortic Atherosclerosis (ICD10-I70.0) and Emphysema (ICD10-J43.9). Electronically Signed   By: Anner Crete M.D.   On: 10/19/2022 17:41   DG Chest 2 View  Result Date: 10/19/2022 CLINICAL DATA:  Chest pain EXAM: CHEST - 2 VIEW COMPARISON:  01/04/2022 FINDINGS: Asymmetric elevation left hemidiaphragm. Trace atelectasis noted left base with possible tiny left pleural effusion. Right lung clear. The cardiopericardial silhouette is within normal limits for size. The visualized bony structures of the thorax are unremarkable. IMPRESSION: Asymmetric elevation left hemidiaphragm with trace left base atelectasis and possible tiny left pleural effusion. Electronically Signed   By: Misty Stanley M.D.   On: 10/19/2022 13:41     Time coordinating discharge: Over 30 minutes    Dwyane Dee, MD  [facility] 10/23/2022, 12:04 PM

## 2022-10-30 ENCOUNTER — Emergency Department (HOSPITAL_COMMUNITY): Payer: Medicare PPO

## 2022-10-30 ENCOUNTER — Inpatient Hospital Stay (HOSPITAL_COMMUNITY)
Admission: EM | Admit: 2022-10-30 | Discharge: 2022-11-06 | DRG: 799 | Disposition: A | Discharge status: Home / Self Care | Payer: Medicare PPO | Attending: Internal Medicine | Admitting: Internal Medicine

## 2022-10-30 ENCOUNTER — Encounter (HOSPITAL_COMMUNITY): Payer: Self-pay

## 2022-10-30 ENCOUNTER — Other Ambulatory Visit: Payer: Self-pay

## 2022-10-30 DIAGNOSIS — K831 Obstruction of bile duct: Secondary | ICD-10-CM | POA: Diagnosis present

## 2022-10-30 DIAGNOSIS — N179 Acute kidney failure, unspecified: Secondary | ICD-10-CM | POA: Diagnosis present

## 2022-10-30 DIAGNOSIS — I82890 Acute embolism and thrombosis of other specified veins: Secondary | ICD-10-CM | POA: Diagnosis present

## 2022-10-30 DIAGNOSIS — K7030 Alcoholic cirrhosis of liver without ascites: Secondary | ICD-10-CM | POA: Diagnosis present

## 2022-10-30 DIAGNOSIS — K5730 Diverticulosis of large intestine without perforation or abscess without bleeding: Secondary | ICD-10-CM | POA: Diagnosis present

## 2022-10-30 DIAGNOSIS — E861 Hypovolemia: Secondary | ICD-10-CM | POA: Diagnosis present

## 2022-10-30 DIAGNOSIS — K8689 Other specified diseases of pancreas: Secondary | ICD-10-CM | POA: Diagnosis present

## 2022-10-30 DIAGNOSIS — K859 Acute pancreatitis without necrosis or infection, unspecified: Secondary | ICD-10-CM | POA: Diagnosis present

## 2022-10-30 DIAGNOSIS — Z515 Encounter for palliative care: Secondary | ICD-10-CM | POA: Diagnosis not present

## 2022-10-30 DIAGNOSIS — D735 Infarction of spleen: Principal | ICD-10-CM | POA: Diagnosis present

## 2022-10-30 DIAGNOSIS — J90 Pleural effusion, not elsewhere classified: Secondary | ICD-10-CM | POA: Diagnosis not present

## 2022-10-30 DIAGNOSIS — Z888 Allergy status to other drugs, medicaments and biological substances status: Secondary | ICD-10-CM

## 2022-10-30 DIAGNOSIS — K700 Alcoholic fatty liver: Secondary | ICD-10-CM | POA: Diagnosis present

## 2022-10-30 DIAGNOSIS — J9601 Acute respiratory failure with hypoxia: Secondary | ICD-10-CM | POA: Diagnosis not present

## 2022-10-30 DIAGNOSIS — I129 Hypertensive chronic kidney disease with stage 1 through stage 4 chronic kidney disease, or unspecified chronic kidney disease: Secondary | ICD-10-CM | POA: Diagnosis present

## 2022-10-30 DIAGNOSIS — K863 Pseudocyst of pancreas: Secondary | ICD-10-CM | POA: Diagnosis present

## 2022-10-30 DIAGNOSIS — J9811 Atelectasis: Secondary | ICD-10-CM | POA: Diagnosis not present

## 2022-10-30 DIAGNOSIS — N182 Chronic kidney disease, stage 2 (mild): Secondary | ICD-10-CM | POA: Diagnosis present

## 2022-10-30 DIAGNOSIS — K860 Alcohol-induced chronic pancreatitis: Secondary | ICD-10-CM | POA: Diagnosis present

## 2022-10-30 DIAGNOSIS — K862 Cyst of pancreas: Secondary | ICD-10-CM | POA: Diagnosis present

## 2022-10-30 DIAGNOSIS — D62 Acute posthemorrhagic anemia: Secondary | ICD-10-CM | POA: Diagnosis present

## 2022-10-30 DIAGNOSIS — D509 Iron deficiency anemia, unspecified: Secondary | ICD-10-CM | POA: Diagnosis present

## 2022-10-30 DIAGNOSIS — E46 Unspecified protein-calorie malnutrition: Secondary | ICD-10-CM | POA: Diagnosis present

## 2022-10-30 DIAGNOSIS — R112 Nausea with vomiting, unspecified: Secondary | ICD-10-CM | POA: Insufficient documentation

## 2022-10-30 DIAGNOSIS — R54 Age-related physical debility: Secondary | ICD-10-CM | POA: Diagnosis present

## 2022-10-30 DIAGNOSIS — E871 Hypo-osmolality and hyponatremia: Secondary | ICD-10-CM | POA: Diagnosis not present

## 2022-10-30 DIAGNOSIS — K861 Other chronic pancreatitis: Secondary | ICD-10-CM

## 2022-10-30 DIAGNOSIS — I959 Hypotension, unspecified: Secondary | ICD-10-CM | POA: Diagnosis present

## 2022-10-30 DIAGNOSIS — F1020 Alcohol dependence, uncomplicated: Secondary | ICD-10-CM

## 2022-10-30 DIAGNOSIS — Z635 Disruption of family by separation and divorce: Secondary | ICD-10-CM

## 2022-10-30 DIAGNOSIS — R55 Syncope and collapse: Secondary | ICD-10-CM | POA: Diagnosis not present

## 2022-10-30 DIAGNOSIS — Z8619 Personal history of other infectious and parasitic diseases: Secondary | ICD-10-CM

## 2022-10-30 DIAGNOSIS — Z79899 Other long term (current) drug therapy: Secondary | ICD-10-CM

## 2022-10-30 DIAGNOSIS — K661 Hemoperitoneum: Secondary | ICD-10-CM | POA: Diagnosis present

## 2022-10-30 DIAGNOSIS — Z6820 Body mass index (BMI) 20.0-20.9, adult: Secondary | ICD-10-CM

## 2022-10-30 DIAGNOSIS — K852 Alcohol induced acute pancreatitis without necrosis or infection: Secondary | ICD-10-CM | POA: Diagnosis present

## 2022-10-30 DIAGNOSIS — F1021 Alcohol dependence, in remission: Secondary | ICD-10-CM | POA: Diagnosis present

## 2022-10-30 DIAGNOSIS — R7989 Other specified abnormal findings of blood chemistry: Secondary | ICD-10-CM | POA: Diagnosis present

## 2022-10-30 DIAGNOSIS — S36029A Unspecified contusion of spleen, initial encounter: Secondary | ICD-10-CM | POA: Diagnosis present

## 2022-10-30 DIAGNOSIS — D72823 Leukemoid reaction: Secondary | ICD-10-CM | POA: Diagnosis present

## 2022-10-30 DIAGNOSIS — F1011 Alcohol abuse, in remission: Secondary | ICD-10-CM | POA: Diagnosis present

## 2022-10-30 DIAGNOSIS — K449 Diaphragmatic hernia without obstruction or gangrene: Secondary | ICD-10-CM

## 2022-10-30 DIAGNOSIS — R739 Hyperglycemia, unspecified: Secondary | ICD-10-CM | POA: Insufficient documentation

## 2022-10-30 DIAGNOSIS — D649 Anemia, unspecified: Principal | ICD-10-CM

## 2022-10-30 HISTORY — DX: Alcohol-induced chronic pancreatitis: K86.0

## 2022-10-30 HISTORY — DX: Personal history of other infectious and parasitic diseases: Z86.19

## 2022-10-30 HISTORY — DX: Iron deficiency anemia, unspecified: D50.9

## 2022-10-30 LAB — ABO/RH: ABO/RH(D): O POS

## 2022-10-30 LAB — CBC WITH DIFFERENTIAL/PLATELET
Abs Immature Granulocytes: 0.13 10*3/uL — ABNORMAL HIGH (ref 0.00–0.07)
Basophils Absolute: 0 10*3/uL (ref 0.0–0.1)
Basophils Relative: 0 %
Eosinophils Absolute: 0 10*3/uL (ref 0.0–0.5)
Eosinophils Relative: 0 %
HCT: 18.2 % — ABNORMAL LOW (ref 39.0–52.0)
Hemoglobin: 6.1 g/dL — CL (ref 13.0–17.0)
Immature Granulocytes: 1 %
Lymphocytes Relative: 8 %
Lymphs Abs: 1.6 10*3/uL (ref 0.7–4.0)
MCH: 33.7 pg (ref 26.0–34.0)
MCHC: 33.5 g/dL (ref 30.0–36.0)
MCV: 100.6 fL — ABNORMAL HIGH (ref 80.0–100.0)
Monocytes Absolute: 1.8 10*3/uL — ABNORMAL HIGH (ref 0.1–1.0)
Monocytes Relative: 9 %
Neutro Abs: 15.8 10*3/uL — ABNORMAL HIGH (ref 1.7–7.7)
Neutrophils Relative %: 82 %
Platelets: 354 10*3/uL (ref 150–400)
RBC: 1.81 MIL/uL — ABNORMAL LOW (ref 4.22–5.81)
RDW: 17 % — ABNORMAL HIGH (ref 11.5–15.5)
WBC: 19.4 10*3/uL — ABNORMAL HIGH (ref 4.0–10.5)
nRBC: 0 % (ref 0.0–0.2)

## 2022-10-30 LAB — POC OCCULT BLOOD, ED: Fecal Occult Bld: NEGATIVE

## 2022-10-30 LAB — PROTIME-INR
INR: 1.2 (ref 0.8–1.2)
Prothrombin Time: 15.2 seconds (ref 11.4–15.2)

## 2022-10-30 LAB — FOLATE: Folate: 8.6 ng/mL (ref >5.9)

## 2022-10-30 LAB — COMPREHENSIVE METABOLIC PANEL
ALT: 59 U/L — ABNORMAL HIGH (ref 0–44)
AST: 55 U/L — ABNORMAL HIGH (ref 15–41)
Albumin: 2.4 g/dL — ABNORMAL LOW (ref 3.5–5.0)
Alkaline Phosphatase: 164 U/L — ABNORMAL HIGH (ref 38–126)
Anion gap: 9 (ref 5–15)
BUN: 29 mg/dL — ABNORMAL HIGH (ref 8–23)
CO2: 18 mmol/L — ABNORMAL LOW (ref 22–32)
Calcium: 7.5 mg/dL — ABNORMAL LOW (ref 8.9–10.3)
Chloride: 103 mmol/L (ref 98–111)
Creatinine, Ser: 2.27 mg/dL — ABNORMAL HIGH (ref 0.61–1.24)
GFR, Estimated: 30 mL/min — ABNORMAL LOW (ref >60)
Glucose, Bld: 142 mg/dL — ABNORMAL HIGH (ref 70–99)
Potassium: 4.6 mmol/L (ref 3.5–5.1)
Sodium: 130 mmol/L — ABNORMAL LOW (ref 135–145)
Total Bilirubin: 4.1 mg/dL — ABNORMAL HIGH (ref 0.3–1.2)
Total Protein: 6.5 g/dL (ref 6.5–8.1)

## 2022-10-30 LAB — RETICULOCYTES
Immature Retic Fract: 29.2 % — ABNORMAL HIGH (ref 2.3–15.9)
RBC.: 1.7 MIL/uL — ABNORMAL LOW (ref 4.22–5.81)
Retic Count, Absolute: 103.5 10*3/uL (ref 19.0–186.0)
Retic Ct Pct: 6.1 % — ABNORMAL HIGH (ref 0.4–3.1)

## 2022-10-30 LAB — IRON AND TIBC
Iron: 44 ug/dL — ABNORMAL LOW (ref 45–182)
Saturation Ratios: 15 % — ABNORMAL LOW (ref 17.9–39.5)
TIBC: 290 ug/dL (ref 250–450)
UIBC: 246 ug/dL

## 2022-10-30 LAB — LIPASE, BLOOD: Lipase: 276 U/L — ABNORMAL HIGH (ref 11–51)

## 2022-10-30 LAB — TROPONIN I (HIGH SENSITIVITY)
Troponin I (High Sensitivity): 8 ng/L (ref <18)
Troponin I (High Sensitivity): 11 ng/L (ref <18)

## 2022-10-30 LAB — FERRITIN: Ferritin: 618 ng/mL — ABNORMAL HIGH (ref 24–336)

## 2022-10-30 LAB — PREPARE RBC (CROSSMATCH)

## 2022-10-30 LAB — VITAMIN B12: Vitamin B-12: 451 pg/mL (ref 180–914)

## 2022-10-30 MED ORDER — LACTATED RINGERS IV BOLUS: 1000.0000 mL | Freq: Once | INTRAVENOUS | Status: DC

## 2022-10-30 MED ORDER — SODIUM CHLORIDE 0.9 % IV SOLN: 8.0000 mg | Freq: Four times a day (QID) | INTRAVENOUS | Status: DC | PRN

## 2022-10-30 MED ORDER — IOHEXOL 350 MG/ML SOLN
75.0000 mL | Freq: Once | INTRAVENOUS | Status: AC | PRN
  Administered 2022-10-30: 75 mL via INTRAVENOUS

## 2022-10-30 MED ORDER — BISACODYL 10 MG RE SUPP: 10.0000 mg | Freq: Two times a day (BID) | RECTAL | Status: DC | PRN

## 2022-10-30 MED ORDER — SODIUM CHLORIDE 0.9% IV SOLUTION: Freq: Once | INTRAVENOUS | Status: AC

## 2022-10-30 MED ORDER — HYDROMORPHONE HCL 1 MG/ML IJ SOLN
1.0000 mg | Freq: Once | INTRAMUSCULAR | Status: AC
  Administered 2022-10-30: 1 mg via INTRAVENOUS
  Filled 2022-10-30: qty 1

## 2022-10-30 MED ORDER — MENTHOL 3 MG MT LOZG: 1.0000 | LOZENGE | OROMUCOSAL | Status: DC | PRN

## 2022-10-30 MED ORDER — ONDANSETRON HCL 4 MG/2ML IJ SOLN
4.0000 mg | Freq: Once | INTRAMUSCULAR | Status: AC | PRN
  Administered 2022-10-30: 4 mg via INTRAVENOUS
  Filled 2022-10-30: qty 2

## 2022-10-30 MED ORDER — MAGIC MOUTHWASH: 15.0000 mL | Freq: Four times a day (QID) | ORAL | Status: DC | PRN

## 2022-10-30 MED ORDER — PANTOPRAZOLE 80MG IVPB - SIMPLE MED
80.0000 mg | Freq: Once | INTRAVENOUS | Status: AC
  Administered 2022-10-30: 80 mg via INTRAVENOUS
  Filled 2022-10-30: qty 100

## 2022-10-30 MED ORDER — LACTATED RINGERS IV SOLN: INTRAVENOUS | Status: DC

## 2022-10-30 MED ORDER — CALCIUM POLYCARBOPHIL 625 MG PO TABS
625.0000 mg | ORAL_TABLET | Freq: Two times a day (BID) | ORAL | Status: DC
  Administered 2022-10-31 – 2022-11-06 (×13): 625 mg via ORAL
  Filled 2022-10-30 (×14): qty 1

## 2022-10-30 MED ORDER — PROCHLORPERAZINE EDISYLATE 10 MG/2ML IJ SOLN: 5.0000 mg | INTRAMUSCULAR | Status: DC | PRN

## 2022-10-30 MED ORDER — LACTATED RINGERS IV BOLUS: 1000.0000 mL | Freq: Three times a day (TID) | INTRAVENOUS | Status: DC | PRN

## 2022-10-30 MED ORDER — HYDROMORPHONE HCL 1 MG/ML IJ SOLN
0.5000 mg | INTRAMUSCULAR | Status: DC | PRN
  Administered 2022-10-31 – 2022-11-01 (×3): 1 mg via INTRAVENOUS
  Administered 2022-11-01: 2 mg via INTRAVENOUS
  Administered 2022-11-01 (×2): 1 mg via INTRAVENOUS
  Administered 2022-11-01 – 2022-11-02 (×2): 2 mg via INTRAVENOUS
  Administered 2022-11-02 – 2022-11-04 (×6): 1 mg via INTRAVENOUS
  Filled 2022-10-30 (×6): qty 1
  Filled 2022-10-30 (×3): qty 2
  Filled 2022-10-30 (×5): qty 1

## 2022-10-30 MED ORDER — ONDANSETRON HCL 4 MG/2ML IJ SOLN
4.0000 mg | Freq: Four times a day (QID) | INTRAMUSCULAR | Status: DC | PRN
  Administered 2022-10-31 – 2022-11-06 (×6): 4 mg via INTRAVENOUS
  Filled 2022-10-30 (×6): qty 2

## 2022-10-30 MED ORDER — ACETAMINOPHEN 650 MG RE SUPP: 650.0000 mg | Freq: Four times a day (QID) | RECTAL | Status: DC | PRN

## 2022-10-30 MED ORDER — PHENOL 1.4 % MT LIQD: 2.0000 | OROMUCOSAL | Status: DC | PRN

## 2022-10-30 MED ORDER — METHOCARBAMOL 1000 MG/10ML IJ SOLN: 1000.0000 mg | Freq: Four times a day (QID) | INTRAVENOUS | Status: DC | PRN

## 2022-10-30 MED ORDER — ACETAMINOPHEN 325 MG PO TABS: 325.0000 mg | ORAL_TABLET | Freq: Four times a day (QID) | ORAL | Status: DC | PRN

## 2022-10-30 MED ORDER — LACTATED RINGERS IV BOLUS
1000.0000 mL | Freq: Once | INTRAVENOUS | Status: AC
  Administered 2022-10-30: 1000 mL via INTRAVENOUS

## 2022-10-30 MED ORDER — LIP MEDEX EX OINT
TOPICAL_OINTMENT | Freq: Two times a day (BID) | CUTANEOUS | Status: DC
  Administered 2022-11-01 – 2022-11-05 (×5): 75 via TOPICAL
  Filled 2022-10-30 (×4): qty 7

## 2022-10-30 NOTE — Consult Note (Signed)
Jared Duran  1950-10-14 [id number]  CARE TEAM:  PCP: [name], MD  Outpatient Care Team: Patient Care Team: [name], MD as PCP - General (Family Medicine) [name], MD as Consulting Physician (Gastroenterology) [name], MD as Referring Physician (Surgery)  Inpatient Treatment Team: Treatment Team: Attending Provider: Sherwood Gambler, MD; Technician: Robinette Haines, NT; Registered Nurse: Luvenia Starch, RN; Consulting Physician: Nolon Nations, MD; Consulting Physician: [name], MD   This patient is a 72 y.o.male who presents today for surgical evaluation at the request of Dr Regenia Skeeter.   Chief complaint / Reason for evaluation: Abnormal CT scan  72 year old male.  History of heavy alcohol use in the past with pancreatitis.  Recurrent episodes of 3 admissions.  Pancreatic calcifications.  Pseudocysts.  More recently has been admitted often in the past year.  Was admitted last week with pancreatitis and probable pseudocyst formation with some perisplenic fluid.  Claims to been abstinent from alcohol for the past 6 months.  Followed by Dr. Paulita Fujita with [facility] gastroenterology.  Patient discharged with hemoglobin in the sevens concerning for some anemia.  Patient had episode of nausea vomiting and passing out several times.  Concern.  Came emergency department.  No evidence of any shock or tachycardia but hemoglobin in the sixes.  Creatinine elevated.  Getting transfused.  CT scan showing more fluid around the pancreas concern for rupture.  Noncontrasted.  Surgery consultation requested made to comment on the CAT scan.  Patient not fully anticoagulated.  Patient has a history of H. pylori infection.  Some tobacco abuse.  No history of assault/trauma or hematemesis.  I do not think he has been officially diagnosed with cirrhosis but at the very least has fatty change in his liver.   Assessment  Jared Duran  71 y.o. male       Problem  List:  Principal Problem:   Syncope and collapse Active Problems:   Chronic pancreatitis due to chronic alcoholism (HCC)   Acute pancreatitis   Fatty liver, alcoholic   LFT elevation   Alcohol abuse, in remission   IDA (iron deficiency anemia)   Spleen hematoma   Hiatal hernia   Pancreatic calcification   CKD (chronic kidney disease) stage 2, GFR 60-89 ml/min   Hyperglycemia   Nausea & vomiting   Nausea vomiting with syncope and evidence of persistent acute on chronic pancreatitis with increased swelling and fluid around spleen suspicious for worsening hematoma and known pseudocysts..  Plan:  Medical hospitalization.  Consider stepdown unit monitoring.  Bedrest.  Hold anticoagulation with his hemoglobin low.  Transfuse.  Keep Hgb > 7.  Consider CT scan with IV contrast.  There is active extravasation on the spleen concerning for active bleeding, could benefit from selective IR embolization.  Otherwise try and hold off and allow this to resolve.  Given the fact that his hemoglobin was in the sevens and now in the sixes after a week argues against any severe hemorrhage going on.  He is not in obvious shock.  However concerning.  Surgery can help follow but would be very guarded against proceeding with operative splenectomy unless lifesaving event and all other options including IR coil embolization exhausted given his deconditioned state with probable cirrhosis and portal hypertension.  Strict alcohol abstinence.  He claims he has done that.  I concerned that maybe he has cirrhosis and portal hypertension which is giving him splenic varices and other issues.  He has persistent elevated liver function tests.  Looks  like he had hepatitis panel done last year at [facility] that was negative.  Prior MRCP argues against choledocholithiasis.  Defer to GI on further workup.  Full note to follow  I reviewed nursing notes, ED provider notes, Consultant GI notes, hospitalist  notes, last 24 h vitals and pain scores, last 48 h intake and output, last 24 h labs and trends, and last 24 h imaging results. I have reviewed this patient's available data, including medical history, events of note, test results, etc as part of my evaluation.  A significant portion of that time was spent in counseling.  Care during the described time interval was provided by me.  This care required high  level of medical decision making.  10/30/2022  Adin Hector, MD, FACS, MASCRS Esophageal, Gastrointestinal & Colorectal Surgery Robotic and Minimally Invasive Surgery  [facility] [place] [id number], [place], [place] [address] [phone] Fax [phone] Main  CONTACT INFORMATION:  Weekday (9AM-5PM): Call [facility] main office at [phone]  Weeknight (5PM-9AM) or Weekend/Holiday: Check [email or web address] (password " [email or web address]") for General Surgery [facility] coverage  (Please, do not use SecureChat as it is not reliable communication to reach operating surgeons for immediate patient care given surgeries/outpatient duties/clinic/cross-coverage/off post-call which would lead to a delay in care.  Epic staff messaging available for outptient concerns, but may not be answered for 48 hours or more).     10/30/2022      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 ([place]) [id number]   History of Helicobacter pylori infection 2021 10/30/2022   Hypertension    IDA (iron deficiency anemia) 10/30/2022   Pancreatitis     Past Surgical History:  Procedure Laterality Date   ESOPHAGOGASTRODUODENOSCOPY (EGD) WITH PROPOFOL N/A 04/10/2021   Procedure: ESOPHAGOGASTRODUODENOSCOPY (EGD) WITH PROPOFOL;  Surgeon: [name], MD;  Location: [facility];  Service: Endoscopy;  Laterality: N/A;   EUS N/A 04/10/2021   Procedure: UPPER ENDOSCOPIC ULTRASOUND (EUS) LINEAR;   Surgeon: [name], MD;  Location: [facility];  Service: Endoscopy;  Laterality: N/A;    Social History   Socioeconomic History   Marital status: Legally Separated    Spouse name: Not on file   Number of children: Not on file   Years of education: Not on file   Highest education level: Not on file  Occupational History   Not on file  Tobacco Use   Smoking status: Every Day    Packs/day: 1.00    Types: Cigarettes   Smokeless tobacco: Never  Substance and Sexual Activity   Alcohol use: Not Currently    Comment: occ   Drug use: No   Sexual activity: Not on file  Other Topics Concern   Not on file  Social History Narrative   Not on file   Social Determinants of Health   Financial Resource Strain: Not on file  Food Insecurity: No Food Insecurity (10/19/2022)   Hunger Vital Sign    Worried About Running Out of Food in the Last Year: Never true    Ran Out of Food in the Last Year: Never true  Transportation Needs: No Transportation Needs (10/19/2022)   PRAPARE - Hydrologist (Medical): No    Lack of Transportation (Non-Medical): No  Physical Activity: Not on file  Stress: Not on file  Social Connections: Not on file  Intimate Partner Violence:  Not At Risk (10/19/2022)   Humiliation, Afraid, Rape, and Kick questionnaire    Fear of Current or Ex-Partner: No    Emotionally Abused: No    Physically Abused: No    Sexually Abused: No    History reviewed. No pertinent family history.  Current Facility-Administered Medications  Medication Dose Route Frequency Provider Last Rate Last Admin   0.9 %  sodium chloride infusion (Manually program via Guardrails IV Fluids)   Intravenous Once Sherwood Gambler, MD       Current Outpatient Medications  Medication Sig Dispense Refill   hydrOXYzine (ATARAX) 25 MG tablet Take 25 mg by mouth 3 (three) times daily as needed for anxiety or itching.     losartan (COZAAR) 100 MG tablet Take 100 mg by mouth  daily.     oxyCODONE (ROXICODONE) 5 MG immediate release tablet Take 1 tablet (5 mg total) by mouth every 6 (six) hours as needed. 20 tablet 0   pantoprazole (PROTONIX) 20 MG tablet Take 20 mg by mouth daily.     sertraline (ZOLOFT) 25 MG tablet Take 25 mg by mouth at bedtime.       Allergies  Allergen Reactions   Ethanol Other (See Comments)    Severe pancreatitis & liver disease = NO ALCOHOL     BP 106/78   Pulse 79   Temp 98.7 F (37.1 C) (Oral)   Resp 18   Ht 5' 8"$  (1.727 m)   Wt 61.2 kg   SpO2 100%   BMI 20.53 kg/m     Results:   Labs: Results for orders placed or performed during the hospital encounter of 10/30/22 (from the past 48 hour(s))  Comprehensive metabolic panel     Status: Abnormal   Collection Time: 10/30/22  6:19 PM  Result Value Ref Range   Sodium 130 (L) 135 - 145 mmol/L   Potassium 4.6 3.5 - 5.1 mmol/L    Comment: HEMOLYSIS AT THIS LEVEL MAY AFFECT RESULT   Chloride 103 98 - 111 mmol/L   CO2 18 (L) 22 - 32 mmol/L   Glucose, Bld 142 (H) 70 - 99 mg/dL    Comment: Glucose reference range applies only to samples taken after fasting for at least 8 hours.   BUN 29 (H) 8 - 23 mg/dL   Creatinine, Ser 2.27 (H) 0.61 - 1.24 mg/dL   Calcium 7.5 (L) 8.9 - 10.3 mg/dL   Total Protein 6.5 6.5 - 8.1 g/dL   Albumin 2.4 (L) 3.5 - 5.0 g/dL   AST 55 (H) 15 - 41 U/L    Comment: HEMOLYSIS AT THIS LEVEL MAY AFFECT RESULT   ALT 59 (H) 0 - 44 U/L    Comment: HEMOLYSIS AT THIS LEVEL MAY AFFECT RESULT   Alkaline Phosphatase 164 (H) 38 - 126 U/L   Total Bilirubin 4.1 (H) 0.3 - 1.2 mg/dL    Comment: HEMOLYSIS AT THIS LEVEL MAY AFFECT RESULT   GFR, Estimated 30 (L) >60 mL/min    Comment: (NOTE) Calculated using the CKD-EPI Creatinine Equation (2021)    Anion gap 9 5 - 15    Comment: Performed at [facility], [place] [address] [place], [place] [address]  Lipase, blood     Status: Abnormal   Collection Time: 10/30/22  6:19 PM  Result Value Ref Range    Lipase 276 (H) 11 - 51 U/L    Comment: Performed at [facility], [place] [address], [place], [place] [address]  Troponin I (High  Sensitivity)     Status: None   Collection Time: 10/30/22  6:19 PM  Result Value Ref Range   Troponin I (High Sensitivity) 8 <18 ng/L    Comment: (NOTE) Elevated high sensitivity troponin I (hsTnI) values and significant  changes across serial measurements may suggest ACS but many other  chronic and acute conditions are known to elevate hsTnI results.  Refer to the "Links" section for chest pain algorithms and additional  guidance. Performed at [facility], [place] [address], [place], [place] [address]   CBC with Differential     Status: Abnormal   Collection Time: 10/30/22  6:19 PM  Result Value Ref Range   WBC 19.4 (H) 4.0 - 10.5 K/uL   RBC 1.81 (L) 4.22 - 5.81 MIL/uL   Hemoglobin 6.1 (LL) 13.0 - 17.0 g/dL    Comment: REPEATED TO VERIFY THIS CRITICAL RESULT HAS VERIFIED AND BEEN CALLED TO BERRIER,CANDICE RN BY MARINDA BLACK ON 02 22 2024 AT 1918, AND HAS BEEN READ BACK. CRITICAL HGB CALLED TO BERRIER,C RN    HCT 18.2 (L) 39.0 - 52.0 %   MCV 100.6 (H) 80.0 - 100.0 fL   MCH 33.7 26.0 - 34.0 pg   MCHC 33.5 30.0 - 36.0 g/dL   RDW 17.0 (H) 11.5 - 15.5 %   Platelets 354 150 - 400 K/uL   nRBC 0.0 0.0 - 0.2 %   Neutrophils Relative % 82 %   Neutro Abs 15.8 (H) 1.7 - 7.7 K/uL   Lymphocytes Relative 8 %   Lymphs Abs 1.6 0.7 - 4.0 K/uL   Monocytes Relative 9 %   Monocytes Absolute 1.8 (H) 0.1 - 1.0 K/uL   Eosinophils Relative 0 %   Eosinophils Absolute 0.0 0.0 - 0.5 K/uL   Basophils Relative 0 %   Basophils Absolute 0.0 0.0 - 0.1 K/uL   Immature Granulocytes 1 %   Abs Immature Granulocytes 0.13 (H) 0.00 - 0.07 K/uL    Comment: Performed at [facility], [place] [address] [place], [place] [address]  POC occult blood, ED     Status: None   Collection Time: 10/30/22  7:35 PM  Result Value Ref Range    Fecal Occult Bld NEGATIVE NEGATIVE  Troponin I (High Sensitivity)     Status: None   Collection Time: 10/30/22  8:31 PM  Result Value Ref Range   Troponin I (High Sensitivity) 11 <18 ng/L    Comment: (NOTE) Elevated high sensitivity troponin I (hsTnI) values and significant  changes across serial measurements may suggest ACS but many other  chronic and acute conditions are known to elevate hsTnI results.  Refer to the "Links" section for chest pain algorithms and additional  guidance. Performed at [facility], [place] [address], [place], [place] [address]   Vitamin B12     Status: None   Collection Time: 10/30/22  8:31 PM  Result Value Ref Range   Vitamin B-12 451 180 - 914 pg/mL    Comment: (NOTE) This assay is not validated for testing neonatal or myeloproliferative syndrome specimens for Vitamin B12 levels. Performed at [facility], [place] [address], [place], [place] [address]   Folate     Status: None   Collection Time: 10/30/22  8:31 PM  Result Value Ref Range   Folate 8.6 >5.9 ng/mL    Comment: Performed at [facility], [place] [address], [place], [place] [address]  Iron and TIBC  Status: Abnormal   Collection Time: 10/30/22  8:31 PM  Result Value Ref Range   Iron 44 (L) 45 - 182 ug/dL   TIBC 290 250 - 450 ug/dL   Saturation Ratios 15 (L) 17.9 - 39.5 %   UIBC 246 ug/dL    Comment: Performed at [facility], [place] [address] [place], [place] [address]  Ferritin     Status: Abnormal   Collection Time: 10/30/22  8:31 PM  Result Value Ref Range   Ferritin 618 (H) 24 - 336 ng/mL    Comment: Performed at [facility], [place] [address] [place], [place] [address]  Reticulocytes     Status: Abnormal   Collection Time: 10/30/22  8:31 PM  Result Value Ref Range   Retic Ct Pct 6.1 (H) 0.4 - 3.1 %   RBC. 1.70 (L) 4.22 - 5.81 MIL/uL   Retic Count, Absolute 103.5 19.0 - 186.0 K/uL    Immature Retic Fract 29.2 (H) 2.3 - 15.9 %    Comment: Performed at [facility], [place] [address], [place], [place] [address]    Imaging / Studies: CT ABDOMEN PELVIS WO CONTRAST  Result Date: 10/30/2022 CLINICAL DATA:  Abdominal pain and syncope, history of pancreatitis, initial encounter EXAM: CT ABDOMEN AND PELVIS WITHOUT CONTRAST TECHNIQUE: Multidetector CT imaging of the abdomen and pelvis was performed following the standard protocol without IV contrast. RADIATION DOSE REDUCTION: This exam was performed according to the departmental dose-optimization program which includes automated exposure control, adjustment of the mA and/or kV according to patient size and/or use of iterative reconstruction technique. COMPARISON:  10/19/2022 FINDINGS: Lower chest: Lung bases are free of acute infiltrate or sizable effusion. Cardiac blood pool is decreased in attenuation consistent with underlying anemia. Hepatobiliary: Gallbladder is within normal limits. Liver shows no focal abnormality. Very mild Peri hepatic fluid is noted. Pancreas: Diffuse calcifications are noted in the head and uncinate process and to a lesser degree within the body of the pancreas consistent with chronic pancreatitis. Superimposed inflammatory change in the pancreas is noted consistent with acute on chronic pancreatitis. Persistent cystic lesion is noted within the tail of the pancreas again measuring approximately 2.4 cm. This however demonstrates increased density when compared with the recent CT likely related to some underlying hemorrhage within. Spleen: The spleen is significantly enlarged when compare with the prior exam. The previously seen perisplenic fluid collection is not well visualized. These changes are likely related to interval splenic hemorrhage. These changes would correspond with the patient's given clinical history of increased pain. Adrenals/Urinary Tract: Adrenal glands are within normal limits. Kidneys  demonstrate no renal calculi or obstructive changes. Simple cyst is noted in the upper pole of the right kidney. No further follow-up is recommended. The bladder is decompressed. Stomach/Bowel: The appendix is not well visualized. No inflammatory changes to suggest appendicitis are noted. Small bowel and stomach appear within normal limits. Vascular/Lymphatic: Aortic atherosclerosis. No enlarged abdominal or pelvic lymph nodes. Reproductive: Pancreas appears within normal limits. Other: Considerable free fluid is noted which is of greater density than that expected for free fluid and with what appears to be a hematocrit level deep in the pelvis. These changes are consistent with intra-abdominal hemorrhage. Musculoskeletal: Degenerative changes of lumbar spine are noted. No acute bony abnormality is seen. IMPRESSION: Changes consistent with persistent acute pancreatitis. There has been interval hemorrhage within the cystic lesion within the tail the pancreas when compared with the prior CT examination. The spleen is now  significantly enlarged and the splenic parenchyma is not well delineated. Given the perisplenic fluid seen previously and increase in free abdominal fluid with hematocrit, these changes are felt to be related to splenic hemorrhage and likely rupture into the peritoneal cavity. Changes of underlying anemia with decreased density of the cardiac blood pool. Critical Value/emergent results were called by telephone at the time of interpretation on 10/30/2022 at 8:28 pm to Dr. Sherwood Gambler , who verbally acknowledged these results. Electronically Signed   By: Inez Catalina M.D.   On: 10/30/2022 20:37   DG Chest Portable 1 View  Result Date: 10/30/2022 CLINICAL DATA:  Syncope.  Nausea.  Vomiting. EXAM: PORTABLE CHEST 1 VIEW COMPARISON:  CXR 10/19/22 FINDINGS: No pleural effusion. No pneumothorax. No focal airspace opacity. Normal cardiac and mediastinal contours. No radiographically apparent displaced rib  fractures. Visualized upper abdomen is unremarkable. IMPRESSION: No acute cardiopulmonary disease. Electronically Signed   By: Marin Roberts M.D.   On: 10/30/2022 18:22   MR ABDOMEN MRCP W WO CONTAST  Result Date: 10/20/2022 CLINICAL DATA:  Sequelae of chronic pancreatitis on recent CT. EXAM: MRI ABDOMEN WITHOUT AND WITH CONTRAST (INCLUDING MRCP) TECHNIQUE: Multiplanar multisequence MR imaging of the abdomen was performed both before and after the administration of intravenous contrast. Heavily T2-weighted images of the biliary and pancreatic ducts were obtained, and three-dimensional MRCP images were rendered by post processing. CONTRAST:  60m GADAVIST GADOBUTROL 1 MMOL/ML IV SOLN COMPARISON:  CTA 10/19/2022.  MRI/MRCP 02/10/2021 FINDINGS: Lower chest: Left base atelectasis with left pleural effusion. Hepatobiliary: 6 mm cyst noted in the dome of the liver with 9 mm simple cyst in the anterior right liver. No followup imaging is recommended. Gallbladder is distended with some dependent sludge but no stones evident. Intra and extrahepatic biliary duct dilatation is similar to previous MRI. Common duct measured previously at 12 mm is 11 mm today. Common bile duct just proximal to the ampulla is 10 mm today compared to 10 mm previously. As noted previously, there is abrupt tapering of the pancreatic duct in the head of the pancreas. No evidence for choledocholithiasis. Pancreas: Pancreatic head is prominent although the marked calcification seen on CT is not evident on MRI. 13 mm simple appearing cyst noted in the anterior pancreatic head, new in the interval since previous MRI. A second cyst in the tail of the pancreas measures 3.5 cm, showing thin smooth rim enhancement and no evidence for internal septation although there is some debris within the collection. Cyst is relatively homogeneous and low signal intensity on T1 with high signal intensity on T2 imaging. No main duct dilatation in the pancreas. Spleen:  No splenomegaly. No focal mass lesion. Small collection of fluid posterior to the spleen may be loculated. There is a 4.5 x 2.0 cm rim enhancing collection lateral to the spleen that extends up into the lateral aspect of the subdiaphragmatic space. Adrenals/Urinary Tract: No adrenal nodule or mass. Simple cyst noted upper pole right kidney. No followup imaging is recommended. No suspicious abnormality in the left kidney. No hydronephrosis. Stomach/Bowel: Stomach is unremarkable. No gastric wall thickening. No evidence of outlet obstruction. Duodenum is normally positioned as is the ligament of Treitz. Thickening in jejunal small bowel loops of the left upper quadrant is likely secondary. Vascular/Lymphatic: No abdominal aortic aneurysm. Small hepatoduodenal ligament and retroperitoneal lymph nodes evident. Other: Small volume ascites noted with mesenteric edema. 5.4 x 1.8 cm loculated rim enhancing collections identified lateral to the splenic flexure of the colon. Musculoskeletal: No  focal suspicious marrow enhancement within the visualized bony anatomy. IMPRESSION: 1. Stable intra and extrahepatic biliary duct dilatation with abrupt tapering of the pancreatic duct in the head of the pancreas. No evidence for choledocholithiasis. No discernible obstructing mass lesion. 2. The marked pancreatic parenchymal calcification seen on CT is not evident on MRI. No main duct dilatation in the pancreas. 3. 3.5 cm cyst in the tail of the pancreas showing thin smooth rim enhancement and some debris within the collection. A second 13 mm cyst identified in the head of the pancreas. These are likely pseudocysts although superinfection cannot be excluded. 4. 4.5 x 2.0 cm rim enhancing fluid collection identified lateral to the spleen with probable loculated fluid posterior to the spleen as well. An additional 5.4 x 1.8 cm loculated rim enhancing collection identified lateral to the splenic flexure of the colon. These may represent  pseudocysts although abscess is a possibility. 5. Distended gallbladder with layering sludge. No gallbladder wall thickening or evidence of gallstones. 6. Small volume ascites with mesenteric edema. 7. Left base atelectasis with left pleural effusion. 8. Thickening in jejunal small bowel loops of the left upper quadrant is likely secondary. Electronically Signed   By: Misty Stanley M.D.   On: 10/20/2022 08:40   MR 3D Recon At Scanner  Result Date: 10/20/2022 CLINICAL DATA:  Sequelae of chronic pancreatitis on recent CT. EXAM: MRI ABDOMEN WITHOUT AND WITH CONTRAST (INCLUDING MRCP) TECHNIQUE: Multiplanar multisequence MR imaging of the abdomen was performed both before and after the administration of intravenous contrast. Heavily T2-weighted images of the biliary and pancreatic ducts were obtained, and three-dimensional MRCP images were rendered by post processing. CONTRAST:  58m GADAVIST GADOBUTROL 1 MMOL/ML IV SOLN COMPARISON:  CTA 10/19/2022.  MRI/MRCP 02/10/2021 FINDINGS: Lower chest: Left base atelectasis with left pleural effusion. Hepatobiliary: 6 mm cyst noted in the dome of the liver with 9 mm simple cyst in the anterior right liver. No followup imaging is recommended. Gallbladder is distended with some dependent sludge but no stones evident. Intra and extrahepatic biliary duct dilatation is similar to previous MRI. Common duct measured previously at 12 mm is 11 mm today. Common bile duct just proximal to the ampulla is 10 mm today compared to 10 mm previously. As noted previously, there is abrupt tapering of the pancreatic duct in the head of the pancreas. No evidence for choledocholithiasis. Pancreas: Pancreatic head is prominent although the marked calcification seen on CT is not evident on MRI. 13 mm simple appearing cyst noted in the anterior pancreatic head, new in the interval since previous MRI. A second cyst in the tail of the pancreas measures 3.5 cm, showing thin smooth rim enhancement and no  evidence for internal septation although there is some debris within the collection. Cyst is relatively homogeneous and low signal intensity on T1 with high signal intensity on T2 imaging. No main duct dilatation in the pancreas. Spleen: No splenomegaly. No focal mass lesion. Small collection of fluid posterior to the spleen may be loculated. There is a 4.5 x 2.0 cm rim enhancing collection lateral to the spleen that extends up into the lateral aspect of the subdiaphragmatic space. Adrenals/Urinary Tract: No adrenal nodule or mass. Simple cyst noted upper pole right kidney. No followup imaging is recommended. No suspicious abnormality in the left kidney. No hydronephrosis. Stomach/Bowel: Stomach is unremarkable. No gastric wall thickening. No evidence of outlet obstruction. Duodenum is normally positioned as is the ligament of Treitz. Thickening in jejunal small bowel loops of the left  upper quadrant is likely secondary. Vascular/Lymphatic: No abdominal aortic aneurysm. Small hepatoduodenal ligament and retroperitoneal lymph nodes evident. Other: Small volume ascites noted with mesenteric edema. 5.4 x 1.8 cm loculated rim enhancing collections identified lateral to the splenic flexure of the colon. Musculoskeletal: No focal suspicious marrow enhancement within the visualized bony anatomy. IMPRESSION: 1. Stable intra and extrahepatic biliary duct dilatation with abrupt tapering of the pancreatic duct in the head of the pancreas. No evidence for choledocholithiasis. No discernible obstructing mass lesion. 2. The marked pancreatic parenchymal calcification seen on CT is not evident on MRI. No main duct dilatation in the pancreas. 3. 3.5 cm cyst in the tail of the pancreas showing thin smooth rim enhancement and some debris within the collection. A second 13 mm cyst identified in the head of the pancreas. These are likely pseudocysts although superinfection cannot be excluded. 4. 4.5 x 2.0 cm rim enhancing fluid  collection identified lateral to the spleen with probable loculated fluid posterior to the spleen as well. An additional 5.4 x 1.8 cm loculated rim enhancing collection identified lateral to the splenic flexure of the colon. These may represent pseudocysts although abscess is a possibility. 5. Distended gallbladder with layering sludge. No gallbladder wall thickening or evidence of gallstones. 6. Small volume ascites with mesenteric edema. 7. Left base atelectasis with left pleural effusion. 8. Thickening in jejunal small bowel loops of the left upper quadrant is likely secondary. Electronically Signed   By: Misty Stanley M.D.   On: 10/20/2022 08:40   US Abdomen Limited RUQ (LIVER/GB)  Result Date: 10/19/2022 CLINICAL DATA:  Epigastric pain EXAM: ULTRASOUND ABDOMEN LIMITED RIGHT UPPER QUADRANT COMPARISON:  CT angiogram chest abdomen and pelvis 10/19/2022 FINDINGS: Gallbladder: No gallstones or wall thickening visualized. Gallbladder is dilated. Gallbladder sludge is present. No sonographic Murphy sign noted by sonographer. Common bile duct: Diameter: 10 mm. There is also mild-to-moderate intrahepatic biliary ductal dilatation. Liver: Two hepatic cysts are identified. Right lobe cyst measures 11 x 10 x 11 mm. Left lobe cyst measures 11 x 7 x 8 mm. Within normal limits in parenchymal echogenicity. Portal vein is patent on color Doppler imaging with normal direction of blood flow towards the liver. Other: Incidental right renal simple cyst measuring 2.2 x 1.7 x 3.4 cm. IMPRESSION: 1. Gallbladder hydrops with gallbladder sludge. No gallstones or wall thickening. 2. Dilated common bile duct with mild-to-moderate intrahepatic biliary ductal dilatation. Recommend further evaluation with MRCP. Electronically Signed   By: Ronney Asters M.D.   On: 10/19/2022 21:06   CT Angio Chest/Abd/Pel for Dissection W and/or W/WO  Result Date: 10/19/2022 CLINICAL DATA:  Concern for aerated aneurysm. Left-sided chest pain. EXAM: CT  ANGIOGRAPHY CHEST, ABDOMEN AND PELVIS TECHNIQUE: Non-contrast CT of the chest was initially obtained. Multidetector CT imaging through the chest, abdomen and pelvis was performed using the standard protocol during bolus administration of intravenous contrast. Multiplanar reconstructed images and MIPs were obtained and reviewed to evaluate the vascular anatomy. RADIATION DOSE REDUCTION: This exam was performed according to the departmental dose-optimization program which includes automated exposure control, adjustment of the mA and/or kV according to patient size and/or use of iterative reconstruction technique. CONTRAST:  112m OMNIPAQUE IOHEXOL 350 MG/ML SOLN COMPARISON:  CT abdomen pelvis dated 01/04/2022. FINDINGS: Evaluation is limited due to streak artifact caused by patient's arms. Evaluation is also limited due to anasarca and paucity of intra-abdominal fat. CTA CHEST FINDINGS Cardiovascular: There is no cardiomegaly or pericardial effusion. There is mild atherosclerotic calcification of the thoracic aorta.  No aneurysmal dilatation or dissection. The aorta is tortuous. The origins of the great vessels of the aortic arch appear patent. No pulmonary artery embolus identified. Mediastinum/Nodes: No hilar or mediastinal adenopathy. The esophagus is grossly unremarkable. No mediastinal fluid collection. Lungs/Pleura: Small left pleural effusion with partial compressive atelectasis of the left versus pneumonia. There is background of centrilobular emphysema. There is no pneumothorax. The central airways are patent. Musculoskeletal: Degenerative changes of the spine. No acute osseous pathology. Review of the MIP images confirms the above findings. CTA ABDOMEN AND PELVIS FINDINGS VASCULAR Aorta: Mild atherosclerotic calcification of the abdominal aorta. No aneurysmal dilatation or dissection. Celiac: Patent without evidence of aneurysm, dissection, vasculitis or significant stenosis. SMA: Patent without evidence of  aneurysm, dissection, vasculitis or significant stenosis. Renals: Both renal arteries are patent without evidence of aneurysm, dissection, vasculitis, fibromuscular dysplasia or significant stenosis. IMA: Patent without evidence of aneurysm, dissection, vasculitis or significant stenosis. Inflow: Mild atherosclerotic calcification of the iliac arteries. The iliac arteries are patent. No aneurysmal dilatation or dissection. Veins: No obvious venous abnormality within the limitations of this arterial phase study. Review of the MIP images confirms the above findings. NON-VASCULAR No intra-abdominal free air.  Small free fluid within the pelvis. Hepatobiliary: Several small liver cysts, too small to characterize. There is mild dilatation of the biliary tree. No calcified stone within the gallbladder. Pancreas: There is coarse calcification of the head and uncinate process of the pancreas sequela of chronic pancreatitis. Correlation with pancreatic enzymes recommended to evaluate for for possibility of acute on chronic pancreatitis. There is a 3.3 x 2.6 cm cyst in the tail of the pancreas most consistent with a pseudocyst. Attention on follow-up imaging recommended. Spleen: The spleen is unremarkable. There is perisplenic free fluid which appears somewhat loculated, likely sequela of pancreatitis. This fluid may represent a pseudocyst. Adrenals/Urinary Tract: The adrenal glands are poorly visualized. There is a small right renal upper pole cyst. There is no hydronephrosis on either side. The urinary bladder is grossly unremarkable. Stomach/Bowel: There is moderate stool throughout the colon. There is no bowel obstruction. Lymphatic: No adenopathy. Reproductive: The prostate gland is mildly enlarged measuring 4.5 cm in transverse axial diameter. The seminal vesicles are symmetric. Other: Diffuse mesenteric and subcutaneous edema and anasarca. Musculoskeletal: Degenerative changes of the spine. No acute osseous pathology.  Review of the MIP images confirms the above findings. IMPRESSION: 1. No aortic aneurysm or dissection. 2. Small left pleural effusion with partial compressive atelectasis of the left versus pneumonia. 3. Sequela of chronic pancreatitis including pancreatic head calcification pancreatic tail pseudocyst. Correlation with pancreatic enzymes recommended to evaluate for possibility of acute on chronic pancreatitis. 4. Perisplenic fluid collection most consistent with post inflammatory collection or developing pseudocysts. An abscess sign report cyst is not excluded. 5. No bowel obstruction. 6. Aortic Atherosclerosis (ICD10-I70.0) and Emphysema (ICD10-J43.9). Electronically Signed   By: Anner Crete M.D.   On: 10/19/2022 17:41   DG Chest 2 View  Result Date: 10/19/2022 CLINICAL DATA:  Chest pain EXAM: CHEST - 2 VIEW COMPARISON:  01/04/2022 FINDINGS: Asymmetric elevation left hemidiaphragm. Trace atelectasis noted left base with possible tiny left pleural effusion. Right lung clear. The cardiopericardial silhouette is within normal limits for size. The visualized bony structures of the thorax are unremarkable. IMPRESSION: Asymmetric elevation left hemidiaphragm with trace left base atelectasis and possible tiny left pleural effusion. Electronically Signed   By: Misty Stanley M.D.   On: 10/19/2022 13:41    Medications / Allergies: per chart  Antibiotics: Anti-infectives (From admission, onward)    None         Note: Portions of this report may have been transcribed using voice recognition software. Every effort was made to ensure accuracy; however, inadvertent computerized transcription errors may be present.   Any transcriptional errors that result from this process are unintentional.    Adin Hector, MD, FACS, MASCRS Esophageal, Gastrointestinal & Colorectal Surgery Robotic and Minimally Invasive Surgery  [facility]. [address],  [place], [place] [address] [phone] Fax [phone] Main  CONTACT INFORMATION:  Weekday (9AM-5PM): Call [facility] main office at [phone]  Weeknight (5PM-9AM) or Weekend/Holiday: Check [email or web address] (password " [email or web address]") for General Surgery [facility] coverage  (Please, do not use SecureChat as it is not reliable communication to reach operating surgeons for immediate patient care given surgeries/outpatient duties/clinic/cross-coverage/off post-call which would lead to a delay in care.  Epic staff messaging available for outptient concerns, but may not be answered for 48 hours or more).      10/30/2022  10:14 PM

## 2022-10-30 NOTE — ED Triage Notes (Signed)
Patient arrived to the ER by EMS due to abdominal pain and 2 episodes of syncope while at home today. Pt reports history of pancreatitis and cirrhosis. Abdominal distention noted by EMS. Pt has also been having nausea and vomiting. Recently hospitalized for pancreatitis. EMS gave 195mg fentanyl and 5047mNS in route. Pt has no other complaints.

## 2022-10-30 NOTE — ED Provider Notes (Signed)
[place] [facility] AT [facility] Provider Note   CSN: [id number] Arrival date & time: 10/30/22  1734     History  Chief Complaint  Patient presents with   Abdominal Pain   Loss of Consciousness    Jared Duran is a 72 y.o. male.  HPI 72 year old male presents with abdominal pain and syncope.  He has been dealing with recurrent abdominal and chest pain for the last 2 or 3 days.  Similar to when he was admitted a couple weeks ago for pancreatitis.  The chest pain and abdominal pain is left-sided.  He has been vomiting.  No diarrhea or shortness of breath.  Today while he was urinating he felt lightheaded and nauseated and passed out.  When he tried to stand up he passed out again.  EMS gave him fentanyl and 500 mL of IV fluids.  Right now he is still having pain.  Pain is a similar type of pain to when he had pancreatitis though he feels like it stronger today.  Home Medications Prior to Admission medications   Medication Sig Start Date End Date Taking? Authorizing Provider  hydrOXYzine (ATARAX) 25 MG tablet Take 25 mg by mouth 3 (three) times daily as needed for anxiety or itching. 10/10/22   [provider]  losartan (COZAAR) 100 MG tablet Take 100 mg by mouth daily. 07/25/22   [provider]  oxyCODONE (ROXICODONE) 5 MG immediate release tablet Take 1 tablet (5 mg total) by mouth every 6 (six) hours as needed. 10/23/22 10/23/23  Dwyane Dee, MD  pantoprazole (PROTONIX) 20 MG tablet Take 20 mg by mouth daily. 08/11/22   [provider]  sertraline (ZOLOFT) 25 MG tablet Take 25 mg by mouth at bedtime. 10/10/22   [provider]      Allergies    Ethanol    Review of Systems   Review of Systems  Constitutional:  Negative for fever.  Respiratory:  Negative for shortness of breath.   Cardiovascular:  Positive for chest pain.  Gastrointestinal:  Positive for abdominal pain, nausea and vomiting. Negative for diarrhea.     Physical Exam Updated Vital Signs BP 124/79   Pulse 79   Temp 98.7 F (37.1 C) (Oral)   Resp 18   Ht 5' 8"$  (1.727 m)   Wt 61.2 kg   SpO2 100%   BMI 20.53 kg/m  Physical Exam Vitals and nursing note reviewed. Exam conducted with a chaperone present.  Constitutional:      General: He is not in acute distress.    Appearance: He is well-developed. He is not ill-appearing or diaphoretic.  HENT:     Head: Normocephalic and atraumatic.  Eyes:     General: Scleral icterus present.  Cardiovascular:     Rate and Rhythm: Normal rate and regular rhythm.     Heart sounds: Normal heart sounds.  Pulmonary:     Effort: Pulmonary effort is normal.     Breath sounds: Normal breath sounds.  Abdominal:     Palpations: Abdomen is soft.     Tenderness: There is generalized abdominal tenderness.  Genitourinary:    Rectum: Guaiac result negative. No mass, tenderness or external hemorrhoid.  Skin:    General: Skin is warm and dry.  Neurological:     Mental Status: He is alert.     ED Results / Procedures / Treatments   Labs (all labs ordered are listed, but only abnormal results are displayed) Labs Reviewed  COMPREHENSIVE METABOLIC PANEL - Abnormal; Notable for the following components:      Result Value   Sodium 130 (*)    CO2 18 (*)    Glucose, Bld 142 (*)    BUN 29 (*)    Creatinine, Ser 2.27 (*)    Calcium 7.5 (*)    Albumin 2.4 (*)    AST 55 (*)    ALT 59 (*)    Alkaline Phosphatase 164 (*)    Total Bilirubin 4.1 (*)    GFR, Estimated 30 (*)    All other components within normal limits  LIPASE, BLOOD - Abnormal; Notable for the following components:   Lipase 276 (*)    All other components within normal limits  CBC WITH DIFFERENTIAL/PLATELET - Abnormal; Notable for the following components:   WBC 19.4 (*)    RBC 1.81 (*)    Hemoglobin 6.1 (*)    HCT 18.2 (*)    MCV 100.6 (*)    RDW 17.0 (*)    Neutro Abs 15.8 (*)    Monocytes Absolute 1.8 (*)    Abs Immature  Granulocytes 0.13 (*)    All other components within normal limits  IRON AND TIBC - Abnormal; Notable for the following components:   Iron 44 (*)    Saturation Ratios 15 (*)    All other components within normal limits  FERRITIN - Abnormal; Notable for the following components:   Ferritin 618 (*)    All other components within normal limits  RETICULOCYTES - Abnormal; Notable for the following components:   Retic Ct Pct 6.1 (*)    RBC. 1.70 (*)    Immature Retic Fract 29.2 (*)    All other components within normal limits  VITAMIN B12  FOLATE  PROTIME-INR  CBC  COMPREHENSIVE METABOLIC PANEL  PREALBUMIN  POC OCCULT BLOOD, ED  TYPE AND SCREEN  PREPARE RBC (CROSSMATCH)  ABO/RH  TROPONIN I (HIGH SENSITIVITY)  TROPONIN I (HIGH SENSITIVITY)    EKG EKG Interpretation  Date/Time:  Thursday October 30 2022 18:21:07 EST Ventricular Rate:  78 PR Interval:  147 QRS Duration: 104 QT Interval:  340 QTC Calculation: 388 R Axis:   -46 Text Interpretation: Sinus rhythm LAD, consider left anterior fascicular block Anteroseptal infarct, old nonspecific T waves similar to Oct 19 2022 Confirmed by Sherwood Gambler [phone]) on 10/30/2022 6:31:37 PM  Radiology CT Angio Abd/Pel w/ and/or w/o  Result Date: 10/30/2022 CLINICAL DATA:  Splenomegaly, history of pancreatitis, abdominal pain, evidence of hemorrhage on previous CT EXAM: CTA ABDOMEN AND PELVIS WITHOUT AND WITH CONTRAST TECHNIQUE: Multidetector CT imaging of the abdomen and pelvis was performed using the standard protocol during bolus administration of intravenous contrast. Multiplanar reconstructed images and MIPs were obtained and reviewed to evaluate the vascular anatomy. RADIATION DOSE REDUCTION: This exam was performed according to the departmental dose-optimization program which includes automated exposure control, adjustment of the mA and/or kV according to patient size and/or use of iterative reconstruction technique. CONTRAST:  44m  OMNIPAQUE IOHEXOL 350 MG/ML SOLN COMPARISON:  10/30/2022, 10/20/2022, 10/19/2022 FINDINGS: VASCULAR Aorta: Normal caliber aorta without aneurysm, dissection, vasculitis or significant stenosis. Atherosclerosis. Celiac: Patent without evidence of aneurysm, dissection, vasculitis or significant stenosis. SMA: Patent without evidence of aneurysm, dissection, vasculitis or significant stenosis. Renals: Both renal arteries are patent without evidence of aneurysm, dissection, vasculitis, fibromuscular dysplasia or significant stenosis. IMA: Patent without evidence of aneurysm, dissection, vasculitis or significant stenosis. Inflow: Patent without evidence of aneurysm, dissection, vasculitis or significant stenosis.  Proximal Outflow: Bilateral common femoral and visualized portions of the superficial and profunda femoral arteries are patent without evidence of aneurysm, dissection, vasculitis or significant stenosis. Veins: There is mild extrinsic compression upon the main portal vein due to the inflammatory changes of the pancreas. 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 Remaining venous structures are unremarkable. Review of the MIP images confirms the above findings. NON-VASCULAR Lower chest: Trace left pleural effusion. No acute airspace disease. Hepatobiliary: Stable intrahepatic and extrahepatic biliary duct dilation, with common bile duct measuring up to 11 mm. There is narrowing of the downstream common bile duct in the region of the pancreatic head. Minimal gallbladder sludge without evidence of cholecystitis. Stable benign hepatic cysts. Pancreas: Stable parenchymal calcifications within the head and body of the pancreas. There is decreased parenchymal enhancement within the region of the pancreatic head, consistent with edema and pancreatitis. Marked peripancreatic inflammatory change. There is a  complex cystic structure adjacent to the tail the pancreas, measuring 4.1 x 2.6 cm, compatible with the hemorrhagic pseudocyst seen on previous imaging. No evidence of contrast extravasation to suggest active hemorrhage. Spleen: The spleen remains enlarged and heterogeneous, measuring approximately 10.0 x 7.7 by 10.5 cm in size. The diffuse heterogeneity of the splenic parenchyma seen previously is again noted, consistent with intracapsular hemorrhage. There is no evidence of contrast extravasation to suggest active bleeding. While no distinct capsular defect is identified, significant perisplenic fat stranding in the left upper quadrant as well as the high attenuation fluid throughout the abdomen and pelvis is concerning for splenic rupture. Adrenals/Urinary Tract: Stable right renal atrophy. No urinary tract calculi or obstruction. Bladder is unremarkable. The adrenals are normal. Stomach/Bowel: No bowel obstruction or ileus. No bowel wall thickening or inflammatory change. Lymphatic: No pathologic adenopathy within the abdomen or pelvis. Reproductive: Stable appearance of the prostate. Other: High attenuation ascites throughout the abdomen and pelvis is unchanged, consistent with hemoperitoneum. No free intraperitoneal gas. No abdominal wall hernia. Musculoskeletal: No acute or destructive bony lesions. Reconstructed images demonstrate no additional findings. IMPRESSION: VASCULAR 1. No evidence of contrast extravasation to suggest active intra-abdominal or intrapelvic hemorrhage. 2. 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 3.  Aortic Atherosclerosis (ICD10-I70.0). NON-VASCULAR 1. Stable heterogeneous enlargement of the spleen, with perisplenic fat stranding and high attenuation fluid throughout the abdomen and pelvis consistent with intra splenic hemorrhage  and likely capsular rupture. 2. Stable changes of pancreatitis, with heterogeneous fluid collection at the pancreatic tail consistent with hemorrhagic pseudocyst. 3. Stable high attenuation free fluid throughout the abdomen and pelvis compatible with hemoperitoneum. 4. Stable intrahepatic and extrahepatic biliary duct dilation, with narrowing of the downstream common bile duct at the level of the pancreatic head. Please see recent MRI evaluation. 5. Gallbladder sludge without cholelithiasis or cholecystitis. 6. Trace left pleural effusion. Electronically Signed   By: Randa Ngo M.D.   On: 10/30/2022 22:37   CT ABDOMEN PELVIS WO CONTRAST  Result Date: 10/30/2022 CLINICAL DATA:  Abdominal pain and syncope, history of pancreatitis, initial encounter EXAM: CT ABDOMEN AND PELVIS WITHOUT CONTRAST TECHNIQUE: Multidetector CT imaging of the abdomen and pelvis was performed following the standard protocol without IV contrast. RADIATION DOSE REDUCTION: This exam was performed according to the departmental dose-optimization program which includes automated exposure control, adjustment of the  mA and/or kV according to patient size and/or use of iterative reconstruction technique. COMPARISON:  10/19/2022 FINDINGS: Lower chest: Lung bases are free of acute infiltrate or sizable effusion. Cardiac blood pool is decreased in attenuation consistent with underlying anemia. Hepatobiliary: Gallbladder is within normal limits. Liver shows no focal abnormality. Very mild Peri hepatic fluid is noted. Pancreas: Diffuse calcifications are noted in the head and uncinate process and to a lesser degree within the body of the pancreas consistent with chronic pancreatitis. Superimposed inflammatory change in the pancreas is noted consistent with acute on chronic pancreatitis. Persistent cystic lesion is noted within the tail of the pancreas again measuring approximately 2.4 cm. This however demonstrates increased density when compared  with the recent CT likely related to some underlying hemorrhage within. Spleen: The spleen is significantly enlarged when compare with the prior exam. The previously seen perisplenic fluid collection is not well visualized. These changes are likely related to interval splenic hemorrhage. These changes would correspond with the patient's given clinical history of increased pain. Adrenals/Urinary Tract: Adrenal glands are within normal limits. Kidneys demonstrate no renal calculi or obstructive changes. Simple cyst is noted in the upper pole of the right kidney. No further follow-up is recommended. The bladder is decompressed. Stomach/Bowel: The appendix is not well visualized. No inflammatory changes to suggest appendicitis are noted. Small bowel and stomach appear within normal limits. Vascular/Lymphatic: Aortic atherosclerosis. No enlarged abdominal or pelvic lymph nodes. Reproductive: Pancreas appears within normal limits. Other: Considerable free fluid is noted which is of greater density than that expected for free fluid and with what appears to be a hematocrit level deep in the pelvis. These changes are consistent with intra-abdominal hemorrhage. Musculoskeletal: Degenerative changes of lumbar spine are noted. No acute bony abnormality is seen. IMPRESSION: Changes consistent with persistent acute pancreatitis. There has been interval hemorrhage within the cystic lesion within the tail the pancreas when compared with the prior CT examination. The spleen is now significantly enlarged and the splenic parenchyma is not well delineated. Given the perisplenic fluid seen previously and increase in free abdominal fluid with hematocrit, these changes are felt to be related to splenic hemorrhage and likely rupture into the peritoneal cavity. Changes of underlying anemia with decreased density of the cardiac blood pool. Critical Value/emergent results were called by telephone at the time of interpretation on 10/30/2022 at  8:28 pm to Dr. Sherwood Gambler , who verbally acknowledged these results. Electronically Signed   By: Inez Catalina M.D.   On: 10/30/2022 20:37   DG Chest Portable 1 View  Result Date: 10/30/2022 CLINICAL DATA:  Syncope.  Nausea.  Vomiting. EXAM: PORTABLE CHEST 1 VIEW COMPARISON:  CXR 10/19/22 FINDINGS: No pleural effusion. No pneumothorax. No focal airspace opacity. Normal cardiac and mediastinal contours. No radiographically apparent displaced rib fractures. Visualized upper abdomen is unremarkable. IMPRESSION: No acute cardiopulmonary disease. Electronically Signed   By: Marin Roberts M.D.   On: 10/30/2022 18:22    Procedures .Critical Care  Performed by: Sherwood Gambler, MD Authorized by: Sherwood Gambler, MD   Critical care provider statement:    Critical care time (minutes):  60   Critical care time was exclusive of:  Separately billable procedures and treating other patients   Critical care was necessary to treat or prevent imminent or life-threatening deterioration of the following conditions:  Circulatory failure   Critical care was time spent personally by me on the following activities:  Development of treatment plan with patient or surrogate, discussions with consultants, evaluation  of patient's response to treatment, examination of patient, ordering and review of laboratory studies, ordering and review of radiographic studies, ordering and performing treatments and interventions, pulse oximetry, re-evaluation of patient's condition and review of old charts     Medications Ordered in ED Medications  0.9 %  sodium chloride infusion (Manually program via Guardrails IV Fluids) (has no administration in time range)  lactated ringers bolus 1,000 mL (has no administration in time range)  HYDROmorphone (DILAUDID) injection 0.5-2 mg (has no administration in time range)  methocarbamol (ROBAXIN) 1,000 mg in dextrose 5 % 100 mL IVPB (has no administration in time range)  acetaminophen  (TYLENOL) suppository 650 mg (has no administration in time range)  acetaminophen (TYLENOL) tablet 325-650 mg (has no administration in time range)  ondansetron (ZOFRAN) injection 4 mg (has no administration in time range)    Or  ondansetron (ZOFRAN) 8 mg in sodium chloride 0.9 % 50 mL IVPB (has no administration in time range)  prochlorperazine (COMPAZINE) injection 5-10 mg (has no administration in time range)  lip balm (CARMEX) ointment (has no administration in time range)  phenol (CHLORASEPTIC) mouth spray 2 spray (has no administration in time range)  menthol-cetylpyridinium (CEPACOL) lozenge 3 mg (has no administration in time range)  magic mouthwash (has no administration in time range)  polycarbophil (FIBERCON) tablet 625 mg (has no administration in time range)  bisacodyl (DULCOLAX) suppository 10 mg (has no administration in time range)  lactated ringers bolus 1,000 mL (0 mLs Intravenous Stopped 10/30/22 1920)  HYDROmorphone (DILAUDID) injection 1 mg (1 mg Intravenous Given 10/30/22 1820)  ondansetron (ZOFRAN) injection 4 mg (4 mg Intravenous Given 10/30/22 1820)  pantoprazole (PROTONIX) 80 mg /NS 100 mL IVPB (0 mg Intravenous Stopped 10/30/22 2109)  lactated ringers bolus 1,000 mL (0 mLs Intravenous Stopped 10/30/22 2230)  iohexol (OMNIPAQUE) 350 MG/ML injection 75 mL (75 mLs Intravenous Contrast Given 10/30/22 2210)    ED Course/ Medical Decision Making/ A&P Clinical Course as of 10/30/22 2332  Thu Oct 30, 2022  1942 Hemoglobin is 6.1 acute on chronic anemia today.  Patient denies any obvious bleeding such as rectal bleeding or melena.  However his daughter who is now at the bedside states that the roommates indicated that he had a fair amount of blood in the trash can that he threw up after passing out.  I do not see any documented history of cirrhosis, will give a dose of Protonix.  His Hemoccult was negative.  Will give blood. [SG]  2153 Discussed case with Dr. Johney Maine.  He recommends  no splenectomy at this time.  He has reviewed CT.  He recommends bed rest, bowel rest, blood, CT with contrast.  He will see in consult.  Recommend seeing gastroenterology as well. [SG]    Clinical Course User Index [SG] Sherwood Gambler, MD                             Medical Decision Making Amount and/or Complexity of Data Reviewed Labs: ordered.    Details: Acute on chronic anemia with 6.1 hemoglobin. Acute kidney injury. Radiology: ordered and independent interpretation performed.    Details: CT with splenomegaly ECG/medicine tests: independent interpretation performed.    Details: No arrhythmia/ischemia  Risk Prescription drug management.   Patient has diffuse tenderness though it seems like this is similar to the exam when he was in the ED last week.  CT is concerning for splenic hemorrhage and he does  have a new anemia although mild compared to last week.  He was given a unit of blood.  Daughter also indicates that he vomited a little blood.  Seems like this is mostly from pancreatitis as when I have gone back to talk to patient he has had no significant trauma.  He does not feel like he injured anything when he passed out today.  As above I talked with Dr. Johney Maine, talked with GI Dr. Watt Climes, as well as hospitalist Dr. Hal Hope.  He has remained stable with normal vitals throughout his ED stay.  He was given IV fluids and blood.  Prior to the CTA which was obtained to help rule out active extravasation, I did have a discussion with the patient and his daughter about potential contrast risks with AKI.  However in this I do have concern that without the CTA we could be missing a potentially life-threatening injury.  Ultimately also discussed with Dr. Kathlene Cote of IR who will see in consult tomorrow but does not feel like an emergent procedure is needed tonight given his stability.        Final Clinical Impression(s) / ED Diagnoses Final diagnoses:  Symptomatic anemia  Acute on  chronic pancreatitis ([place])  Acute kidney injury [facility])    Rx / DC Orders ED Discharge Orders     None         Sherwood Gambler, MD 10/30/22 2334

## 2022-10-31 ENCOUNTER — Inpatient Hospital Stay (HOSPITAL_COMMUNITY): Payer: Medicare PPO

## 2022-10-31 ENCOUNTER — Encounter (HOSPITAL_COMMUNITY): Payer: Self-pay | Admitting: Internal Medicine

## 2022-10-31 DIAGNOSIS — D735 Infarction of spleen: Secondary | ICD-10-CM

## 2022-10-31 DIAGNOSIS — N179 Acute kidney failure, unspecified: Secondary | ICD-10-CM

## 2022-10-31 DIAGNOSIS — R55 Syncope and collapse: Secondary | ICD-10-CM | POA: Diagnosis not present

## 2022-10-31 HISTORY — PX: IR EMBO ART  VEN HEMORR LYMPH EXTRAV  INC GUIDE ROADMAPPING: IMG5450

## 2022-10-31 HISTORY — PX: IR ANGIOGRAM SELECTIVE EACH ADDITIONAL VESSEL: IMG667

## 2022-10-31 HISTORY — PX: IR US GUIDE VASC ACCESS RIGHT: IMG2390

## 2022-10-31 HISTORY — PX: IR ANGIOGRAM VISCERAL SELECTIVE: IMG657

## 2022-10-31 LAB — CBC
HCT: 18.9 % — ABNORMAL LOW (ref 39.0–52.0)
HCT: 26.2 % — ABNORMAL LOW (ref 39.0–52.0)
Hemoglobin: 6.5 g/dL — CL (ref 13.0–17.0)
Hemoglobin: 8.9 g/dL — ABNORMAL LOW (ref 13.0–17.0)
MCH: 32.8 pg (ref 26.0–34.0)
MCH: 31.2 pg (ref 26.0–34.0)
MCHC: 34.4 g/dL (ref 30.0–36.0)
MCHC: 34 g/dL (ref 30.0–36.0)
MCV: 95.5 fL (ref 80.0–100.0)
MCV: 91.9 fL (ref 80.0–100.0)
Platelets: 256 10*3/uL (ref 150–400)
Platelets: 248 10*3/uL (ref 150–400)
RBC: 1.98 MIL/uL — ABNORMAL LOW (ref 4.22–5.81)
RBC: 2.85 MIL/uL — ABNORMAL LOW (ref 4.22–5.81)
RDW: 19.4 % — ABNORMAL HIGH (ref 11.5–15.5)
RDW: 18.6 % — ABNORMAL HIGH (ref 11.5–15.5)
WBC: 18.5 10*3/uL — ABNORMAL HIGH (ref 4.0–10.5)
WBC: 16.8 10*3/uL — ABNORMAL HIGH (ref 4.0–10.5)
nRBC: 0 % (ref 0.0–0.2)
nRBC: 0 % (ref 0.0–0.2)

## 2022-10-31 LAB — COMPREHENSIVE METABOLIC PANEL
ALT: 38 U/L (ref 0–44)
AST: 24 U/L (ref 15–41)
Albumin: 2.1 g/dL — ABNORMAL LOW (ref 3.5–5.0)
Alkaline Phosphatase: 139 U/L — ABNORMAL HIGH (ref 38–126)
Anion gap: 5 (ref 5–15)
BUN: 29 mg/dL — ABNORMAL HIGH (ref 8–23)
CO2: 22 mmol/L (ref 22–32)
Calcium: 7.6 mg/dL — ABNORMAL LOW (ref 8.9–10.3)
Chloride: 108 mmol/L (ref 98–111)
Creatinine, Ser: 1.49 mg/dL — ABNORMAL HIGH (ref 0.61–1.24)
GFR, Estimated: 50 mL/min — ABNORMAL LOW (ref >60)
Glucose, Bld: 113 mg/dL — ABNORMAL HIGH (ref 70–99)
Potassium: 3.9 mmol/L (ref 3.5–5.1)
Sodium: 135 mmol/L (ref 135–145)
Total Bilirubin: 3.1 mg/dL — ABNORMAL HIGH (ref 0.3–1.2)
Total Protein: 6.2 g/dL — ABNORMAL LOW (ref 6.5–8.1)

## 2022-10-31 LAB — URINALYSIS, ROUTINE W REFLEX MICROSCOPIC
Bilirubin Urine: NEGATIVE
Glucose, UA: NEGATIVE mg/dL
Hgb urine dipstick: NEGATIVE
Ketones, ur: NEGATIVE mg/dL
Leukocytes,Ua: NEGATIVE
Nitrite: NEGATIVE
Protein, ur: 30 mg/dL — AB
Specific Gravity, Urine: 1.036 — ABNORMAL HIGH (ref 1.005–1.030)
pH: 5 (ref 5.0–8.0)

## 2022-10-31 LAB — PREPARE RBC (CROSSMATCH)

## 2022-10-31 LAB — CBG MONITORING, ED: Glucose-Capillary: 137 mg/dL — ABNORMAL HIGH (ref 70–99)

## 2022-10-31 LAB — GLUCOSE, CAPILLARY
Glucose-Capillary: 141 mg/dL — ABNORMAL HIGH (ref 70–99)
Glucose-Capillary: 116 mg/dL — ABNORMAL HIGH (ref 70–99)
Glucose-Capillary: 116 mg/dL — ABNORMAL HIGH (ref 70–99)

## 2022-10-31 LAB — MRSA NEXT GEN BY PCR, NASAL: MRSA by PCR Next Gen: NOT DETECTED

## 2022-10-31 LAB — PREALBUMIN: Prealbumin: 11 mg/dL — ABNORMAL LOW (ref 18–38)

## 2022-10-31 MED ORDER — LACTATED RINGERS IV SOLN: INTRAVENOUS | Status: DC

## 2022-10-31 MED ORDER — CEFAZOLIN SODIUM-DEXTROSE 2-4 GM/100ML-% IV SOLN: 2.0000 g | Freq: Once | INTRAVENOUS | Status: AC

## 2022-10-31 MED ORDER — MIDAZOLAM HCL 2 MG/2ML IJ SOLN
INTRAMUSCULAR | Status: AC | PRN
  Administered 2022-10-31 (×2): 1 mg via INTRAVENOUS

## 2022-10-31 MED ORDER — MIDAZOLAM HCL 2 MG/2ML IJ SOLN
INTRAMUSCULAR | Status: AC
  Filled 2022-10-31: qty 4

## 2022-10-31 MED ORDER — SODIUM CHLORIDE 0.9% IV SOLUTION: Freq: Once | INTRAVENOUS | Status: AC

## 2022-10-31 MED ORDER — IODIXANOL 320 MG/ML IV SOLN
50.0000 mL | Freq: Once | INTRAVENOUS | Status: AC | PRN
  Administered 2022-10-31: 25 mL via INTRA_ARTERIAL

## 2022-10-31 MED ORDER — PANTOPRAZOLE SODIUM 40 MG IV SOLR
40.0000 mg | Freq: Two times a day (BID) | INTRAVENOUS | Status: DC
  Administered 2022-10-31 – 2022-11-06 (×13): 40 mg via INTRAVENOUS
  Filled 2022-10-31 (×13): qty 10

## 2022-10-31 MED ORDER — HYDRALAZINE HCL 20 MG/ML IJ SOLN: 5.0000 mg | INTRAMUSCULAR | Status: DC | PRN

## 2022-10-31 MED ORDER — FENTANYL CITRATE (PF) 100 MCG/2ML IJ SOLN
INTRAMUSCULAR | Status: AC | PRN
  Administered 2022-10-31 (×2): 50 ug via INTRAVENOUS

## 2022-10-31 MED ORDER — SODIUM CHLORIDE 0.9 % IV SOLN: Freq: Once | INTRAVENOUS | Status: AC

## 2022-10-31 MED ORDER — CEFAZOLIN SODIUM-DEXTROSE 2-4 GM/100ML-% IV SOLN
INTRAVENOUS | Status: AC
  Administered 2022-10-31: 2 g via INTRAVENOUS
  Filled 2022-10-31: qty 100

## 2022-10-31 MED ORDER — CHLORHEXIDINE GLUCONATE CLOTH 2 % EX PADS
6.0000 | MEDICATED_PAD | Freq: Every day | CUTANEOUS | Status: DC
  Administered 2022-10-31: 6 via TOPICAL

## 2022-10-31 MED ORDER — LIDOCAINE HCL 1 % IJ SOLN
INTRAMUSCULAR | Status: AC
  Administered 2022-10-31: 4 mL
  Filled 2022-10-31: qty 20

## 2022-10-31 MED ORDER — FENTANYL CITRATE (PF) 100 MCG/2ML IJ SOLN
INTRAMUSCULAR | Status: AC
  Filled 2022-10-31: qty 2

## 2022-10-31 NOTE — ED Notes (Signed)
Per IR PA start NS @ 50m/hr x1 dose and start second unit of PRBC when returns from IR

## 2022-10-31 NOTE — Progress Notes (Signed)
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  He was complaining of persistent left upper quadrant pain for couple of days, went to the bathroom, felt lightheaded and passed out.On presentation his blood pressure was soft, hemoglobin was 6.1 which dropped from 7.5 last week.  Lab work showed creatinine of 2.2.  CT imaging showed features concerning for splenic hemorrhage with no active exacerbation.  General surgery, GI, IR consulted.  Being transfused with PRBC.  Patient seen and examined at bedside today in the emergency department.  During my evaluation he was hemodynamically stable.  He was being transferred.  He was complaining of abdominal pain.  But he did not look in distress.   Assessment and plan  Splenic hemorrhage: CT imaging showed splenic hemorrhage/hematoma with parenchymal destruction, some free fluid in the cavity.  IR planning for splenic artery embolization.  General surgery is closely following for consideration of splenectomy. Abdomen is diffusely tender with guarding and rigidity  Chronic pancreatitis: History of alcohol abuse.  Just discharged from here about a week ago.  Elevated lipase with mild elevated liver enzymes.  Imaging so moderate to severe chronic pancreatitis.  Bilirubin elevated to 4  Acute blood loss anemia: Currently being transfused hemoglobin monitor.  AKI/hyponatremia: Most likely secondary to volume loss from bleeding, hypoperfusion/hypotension.  Continue IV fluids, monitor kidney function.  Leukocytosis: Could be reactive.  Low suspicion for infectious etiology.  Continue to monitor

## 2022-10-31 NOTE — Consult Note (Signed)
Referring Provider: [facility] Primary Care Physician:  [name], MD Primary Gastroenterologist:  Dr. Paulita Fujita  Reason for Consultation: Anemia  HPI: Jared Duran is a 72 y.o. 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  CT angiogram abdomen pelvis shows concern for splenic hemorrhage.  EUS 04/2021 done for suspicious mass on imaging: No sign of significant pathology in ampulla, common bile bile duct, and liver.  Imaging of the pancreas showed changes suggestive of moderate to severe chronic pancreatitis.  No pancreatic mass seen.  Patient's daughter provides history. She states over the last week patient has been more confused than usual. Patient states he was walking to the bathroom and woke up on the floor. CT scan shows splenic hemorrhage. Patient denies NSAID/alcohol use. Daughter states last week when she saw patient she noticed he was more confused but unsure how long it has been going on. 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 ([place]) [id number]   History of Helicobacter pylori infection 2021 10/30/2022   Hypertension    IDA (iron deficiency anemia) 10/30/2022   Pancreatitis     Past Surgical History:  Procedure Laterality Date   ESOPHAGOGASTRODUODENOSCOPY (EGD) WITH PROPOFOL N/A 04/10/2021   Procedure: ESOPHAGOGASTRODUODENOSCOPY (EGD) WITH PROPOFOL;  Surgeon: [name], MD;  Location: [facility];  Service: Endoscopy;  Laterality: N/A;   EUS N/A 04/10/2021   Procedure: UPPER ENDOSCOPIC ULTRASOUND (EUS) LINEAR;  Surgeon: [name], MD;  Location: [facility];  Service: Endoscopy;   Laterality: N/A;    Prior to Admission medications   Medication Sig Start Date End Date Taking? Authorizing Provider  NIFEdipine (ADALAT CC) 90 MG 24 hr tablet Take 90 mg by mouth daily. 10/27/22  Yes [provider]  hydrOXYzine (ATARAX) 25 MG tablet Take 25 mg by mouth 3 (three) times daily as needed for anxiety or itching. 10/10/22   [provider]  losartan (COZAAR) 100 MG tablet Take 100 mg by mouth daily. 07/25/22   [provider]  oxyCODONE (ROXICODONE) 5 MG immediate release tablet Take 1 tablet (5 mg total) by mouth every 6 (six) hours as needed. 10/23/22 10/23/23  Dwyane Dee, MD  pantoprazole (PROTONIX) 20 MG tablet Take 20 mg by mouth daily. 08/11/22   [provider]  sertraline (ZOLOFT) 25 MG tablet Take 25 mg by mouth at bedtime. 10/10/22   [provider]    Scheduled Meds:  lip balm   Topical BID   pantoprazole (PROTONIX) IV  40 mg Intravenous Q12H   polycarbophil  625 mg Oral BID   Continuous Infusions:  lactated ringers     methocarbamol (ROBAXIN) IV     ondansetron (ZOFRAN) IV     PRN Meds:.acetaminophen, acetaminophen, bisacodyl, hydrALAZINE, HYDROmorphone (DILAUDID) injection, lactated ringers, magic mouthwash, menthol-cetylpyridinium, methocarbamol (ROBAXIN) IV, ondansetron (ZOFRAN) IV **OR** ondansetron (ZOFRAN) IV, phenol, prochlorperazine  Allergies as of 10/30/2022 - Review Complete 10/30/2022  Allergen Reaction Noted   Ethanol Other (See Comments) 10/30/2022    History reviewed. No pertinent family history.  Social History   Socioeconomic History   Marital status: Legally Separated    Spouse name: Not on file   Number of children: Not on file  Years of education: Not on file   Highest education level: Not on file  Occupational History   Not on file  Tobacco Use   Smoking status: Every Day    Packs/day: 1.00    Types: Cigarettes   Smokeless tobacco: Never  Substance and Sexual Activity   Alcohol use:  Not Currently    Comment: occ   Drug use: No   Sexual activity: Not on file  Other Topics Concern   Not on file  Social History Narrative   Not on file   Social Determinants of Health   Financial Resource Strain: Not on file  Food Insecurity: No Food Insecurity (10/19/2022)   Hunger Vital Sign    Worried About Running Out of Food in the Last Year: Never true    Ran Out of Food in the Last Year: Never true  Transportation Needs: No Transportation Needs (10/19/2022)   PRAPARE - Hydrologist (Medical): No    Lack of Transportation (Non-Medical): No  Physical Activity: Not on file  Stress: Not on file  Social Connections: Not on file  Intimate Partner Violence: Not At Risk (10/19/2022)   Humiliation, Afraid, Rape, and Kick questionnaire    Fear of Current or Ex-Partner: No    Emotionally Abused: No    Physically Abused: No    Sexually Abused: No    Review of Systems: All negative except as stated above in HPI.  Physical Exam:Physical Exam Constitutional:      Appearance: He is ill-appearing and toxic-appearing.  HENT:     Head: Normocephalic and atraumatic.     Nose: Nose normal. No congestion.     Mouth/Throat:     Mouth: Mucous membranes are dry.  Eyes:     General: Scleral icterus present.     Extraocular Movements: Extraocular movements intact.  Cardiovascular:     Rate and Rhythm: Normal rate and regular rhythm.  Pulmonary:     Effort: Pulmonary effort is normal. No respiratory distress.  Abdominal:     General: Abdomen is flat. Bowel sounds are normal. There is distension.     Palpations: Abdomen is soft. There is no mass.     Tenderness: There is abdominal tenderness. There is no guarding or rebound.     Hernia: No hernia is present.  Musculoskeletal:        General: No swelling. Normal range of motion.     Cervical back: Normal range of motion and neck supple.  Skin:    General: Skin is warm and dry.  Neurological:     Mental  Status: He is alert. He is disoriented.  Psychiatric:        Mood and Affect: Mood normal.        Behavior: Behavior normal.        Judgment: Judgment normal.     Vital signs: Vitals:   10/31/22 0800 10/31/22 0810  BP:  115/82  Pulse: 84 88  Resp: 19 19  Temp:    SpO2: 100% 100%        GI:  Lab Results: Recent Labs    10/30/22 1819 10/31/22 0516  WBC 19.4* 18.5*  HGB 6.1* 6.5*  HCT 18.2* 18.9*  PLT 354 256   BMET Recent Labs    10/30/22 1819  NA 130*  K 4.6  CL 103  CO2 18*  GLUCOSE 142*  BUN 29*  CREATININE 2.27*  CALCIUM 7.5*   LFT Recent Labs    10/30/22 1819  PROT 6.5  ALBUMIN 2.4*  AST 55*  ALT 59*  ALKPHOS 164*  BILITOT 4.1*   PT/INR Recent Labs    10/30/22 2311  LABPROT 15.2  INR 1.2     Studies/Results: CT Angio Abd/Pel w/ and/or w/o  Result Date: 10/30/2022 CLINICAL DATA:  Splenomegaly, history of pancreatitis, abdominal pain, evidence of hemorrhage on previous CT EXAM: CTA ABDOMEN AND PELVIS WITHOUT AND WITH CONTRAST TECHNIQUE: Multidetector CT imaging of the abdomen and pelvis was performed using the standard protocol during bolus administration of intravenous contrast. Multiplanar reconstructed images and MIPs were obtained and reviewed to evaluate the vascular anatomy. RADIATION DOSE REDUCTION: This exam was performed according to the departmental dose-optimization program which includes automated exposure control, adjustment of the mA and/or kV according to patient size and/or use of iterative reconstruction technique. CONTRAST:  59m OMNIPAQUE IOHEXOL 350 MG/ML SOLN COMPARISON:  10/30/2022, 10/20/2022, 10/19/2022 FINDINGS: VASCULAR Aorta: Normal caliber aorta without aneurysm, dissection, vasculitis or significant stenosis. Atherosclerosis. Celiac: Patent without evidence of aneurysm, dissection, vasculitis or significant stenosis. SMA: Patent without evidence of aneurysm, dissection, vasculitis or significant stenosis. Renals: Both  renal arteries are patent without evidence of aneurysm, dissection, vasculitis, fibromuscular dysplasia or significant stenosis. IMA: Patent without evidence of aneurysm, dissection, vasculitis or significant stenosis. Inflow: Patent without evidence of aneurysm, dissection, vasculitis or significant stenosis. Proximal Outflow: Bilateral common femoral and visualized portions of the superficial and profunda femoral arteries are patent without evidence of aneurysm, dissection, vasculitis or significant stenosis. Veins: There is mild extrinsic compression upon the main portal vein due to the inflammatory changes of the pancreas. 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 Remaining venous structures are unremarkable. Review of the MIP images confirms the above findings. NON-VASCULAR Lower chest: Trace left pleural effusion. No acute airspace disease. Hepatobiliary: Stable intrahepatic and extrahepatic biliary duct dilation, with common bile duct measuring up to 11 mm. There is narrowing of the downstream common bile duct in the region of the pancreatic head. Minimal gallbladder sludge without evidence of cholecystitis. Stable benign hepatic cysts. Pancreas: Stable parenchymal calcifications within the head and body of the pancreas. There is decreased parenchymal enhancement within the region of the pancreatic head, consistent with edema and pancreatitis. Marked peripancreatic inflammatory change. There is a complex cystic structure adjacent to the tail the pancreas, measuring 4.1 x 2.6 cm, compatible with the hemorrhagic pseudocyst seen on previous imaging. No evidence of contrast extravasation to suggest active hemorrhage. Spleen: The spleen remains enlarged and heterogeneous, measuring approximately 10.0 x 7.7 by 10.5 cm in size. The diffuse heterogeneity of the splenic parenchyma seen previously is again  noted, consistent with intracapsular hemorrhage. There is no evidence of contrast extravasation to suggest active bleeding. While no distinct capsular defect is identified, significant perisplenic fat stranding in the left upper quadrant as well as the high attenuation fluid throughout the abdomen and pelvis is concerning for splenic rupture. Adrenals/Urinary Tract: Stable right renal atrophy. No urinary tract calculi or obstruction. Bladder is unremarkable. The adrenals are normal. Stomach/Bowel: No bowel obstruction or ileus. No bowel wall thickening or inflammatory change. Lymphatic: No pathologic adenopathy within the abdomen or pelvis. Reproductive: Stable appearance of the prostate. Other: High attenuation ascites throughout the abdomen and pelvis is unchanged, consistent with hemoperitoneum. No free intraperitoneal gas. No abdominal wall hernia. Musculoskeletal: No acute or destructive bony lesions. Reconstructed images demonstrate no additional findings. IMPRESSION: VASCULAR 1. No evidence of  contrast extravasation to suggest active intra-abdominal or intrapelvic hemorrhage. 2. 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 3.  Aortic Atherosclerosis (ICD10-I70.0). NON-VASCULAR 1. Stable heterogeneous enlargement of the spleen, with perisplenic fat stranding and high attenuation fluid throughout the abdomen and pelvis consistent with intra splenic hemorrhage and likely capsular rupture. 2. Stable changes of pancreatitis, with heterogeneous fluid collection at the pancreatic tail consistent with hemorrhagic pseudocyst. 3. Stable high attenuation free fluid throughout the abdomen and pelvis compatible with hemoperitoneum. 4. Stable intrahepatic and extrahepatic biliary duct dilation, with narrowing of the downstream common bile duct at the level of the pancreatic  head. Please see recent MRI evaluation. 5. Gallbladder sludge without cholelithiasis or cholecystitis. 6. Trace left pleural effusion. Electronically Signed   By: Randa Ngo M.D.   On: 10/30/2022 22:37   CT ABDOMEN PELVIS WO CONTRAST  Result Date: 10/30/2022 CLINICAL DATA:  Abdominal pain and syncope, history of pancreatitis, initial encounter EXAM: CT ABDOMEN AND PELVIS WITHOUT CONTRAST TECHNIQUE: Multidetector CT imaging of the abdomen and pelvis was performed following the standard protocol without IV contrast. RADIATION DOSE REDUCTION: This exam was performed according to the departmental dose-optimization program which includes automated exposure control, adjustment of the mA and/or kV according to patient size and/or use of iterative reconstruction technique. COMPARISON:  10/19/2022 FINDINGS: Lower chest: Lung bases are free of acute infiltrate or sizable effusion. Cardiac blood pool is decreased in attenuation consistent with underlying anemia. Hepatobiliary: Gallbladder is within normal limits. Liver shows no focal abnormality. Very mild Peri hepatic fluid is noted. Pancreas: Diffuse calcifications are noted in the head and uncinate process and to a lesser degree within the body of the pancreas consistent with chronic pancreatitis. Superimposed inflammatory change in the pancreas is noted consistent with acute on chronic pancreatitis. Persistent cystic lesion is noted within the tail of the pancreas again measuring approximately 2.4 cm. This however demonstrates increased density when compared with the recent CT likely related to some underlying hemorrhage within. Spleen: The spleen is significantly enlarged when compare with the prior exam. The previously seen perisplenic fluid collection is not well visualized. These changes are likely related to interval splenic hemorrhage. These changes would correspond with the patient's given clinical history of increased pain. Adrenals/Urinary Tract: Adrenal  glands are within normal limits. Kidneys demonstrate no renal calculi or obstructive changes. Simple cyst is noted in the upper pole of the right kidney. No further follow-up is recommended. The bladder is decompressed. Stomach/Bowel: The appendix is not well visualized. No inflammatory changes to suggest appendicitis are noted. Small bowel and stomach appear within normal limits. Vascular/Lymphatic: Aortic atherosclerosis. No enlarged abdominal or pelvic lymph nodes. Reproductive: Pancreas appears within normal limits. Other: Considerable free fluid is noted which is of greater density than that expected for free fluid and with what appears to be a hematocrit level deep in the pelvis. These changes are consistent with intra-abdominal hemorrhage. Musculoskeletal: Degenerative changes of lumbar spine are noted. No acute bony abnormality is seen. IMPRESSION: Changes consistent with persistent acute pancreatitis. There has been interval hemorrhage within the cystic lesion within the tail the pancreas when compared with the prior CT examination. The spleen is now significantly enlarged and the splenic parenchyma is not well delineated. Given the perisplenic fluid seen previously and increase in free abdominal fluid with hematocrit, these changes are felt to be related to splenic hemorrhage  and likely rupture into the peritoneal cavity. Changes of underlying anemia with decreased density of the cardiac blood pool. Critical Value/emergent results were called by telephone at the time of interpretation on 10/30/2022 at 8:28 pm to Dr. Sherwood Gambler , who verbally acknowledged these results. Electronically Signed   By: Inez Catalina M.D.   On: 10/30/2022 20:37   DG Chest Portable 1 View  Result Date: 10/30/2022 CLINICAL DATA:  Syncope.  Nausea.  Vomiting. EXAM: PORTABLE CHEST 1 VIEW COMPARISON:  CXR 10/19/22 FINDINGS: No pleural effusion. No pneumothorax. No focal airspace opacity. Normal cardiac and mediastinal contours.  No radiographically apparent displaced rib fractures. Visualized upper abdomen is unremarkable. IMPRESSION: No acute cardiopulmonary disease. Electronically Signed   By: Marin Roberts M.D.   On: 10/30/2022 18:22    Impression: Anemia -Hgb 6.5 -Iron 44, ferritin 618 - fecal occult negative - CTA: no evidence of active intra-abdominal hemorrhage, stable enlargement of spleen, intra splenic hemorrhage and likely capsular rupture. Hemorrhagic pseudocyst at pancreatic tail. Hemoperitoneum. Stable biliary duct dilation. Gallbladder sludge.  Splenic hemorrhage   Acute on chronic pancreatitis -WBC 18.5  Plan: Gen surg and IR also involved. IR possibly considering if embolization may be helpful prior to consideration of splenectomy.  Anemia most likely from splenic hemorrhage. No plans for EGD at this time. If hematemesis occurs, will reconsider. Continue supportive care [facility] will follow     LOS: 1 day   Garnette Scheuermann  PA-C 10/31/2022, 8:17 AM  Contact #  [phone]

## 2022-10-31 NOTE — Plan of Care (Signed)

## 2022-10-31 NOTE — Consult Note (Signed)
Chief Complaint: Patient was seen in consultation today for splenic rupture  Referring Physician(s): Dr. Michael Boston  Supervising Physician: [name]  Patient Status: [facility] - ED  History of Present Illness: Jared Duran is a 72 y.o. male with past medical history of alcohol abuse, chronic pancreatitis, H pylori, HTN, amenia who presented to [facility] ED after syncopal episode at home.  He was recently evaluated in the ED 1 week ago with severe LUQ pain and felt to have exacerbation of his chronic pancreatitis.  After discharge, he continued to have LUQ pain with syncopal episode yesterday evening.    CTA Abdomen Pelvis 10/30/22: IMPRESSION: VASCULAR   1. No evidence of contrast extravasation to suggest active intra-abdominal or intrapelvic hemorrhage. 2. 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 3.  Aortic Atherosclerosis (ICD10-I70.0).   NON-VASCULAR   1. Stable heterogeneous enlargement of the spleen, with perisplenic fat stranding and high attenuation fluid throughout the abdomen and pelvis consistent with intra splenic hemorrhage and likely capsular rupture. 2. Stable changes of pancreatitis, with heterogeneous fluid collection at the pancreatic tail consistent with hemorrhagic pseudocyst. 3. Stable high attenuation free fluid throughout the abdomen and pelvis compatible with hemoperitoneum. 4. Stable intrahepatic and extrahepatic biliary duct dilation, with narrowing of the downstream common bile duct at the level of the pancreatic head. Please see recent MRI evaluation. 5. Gallbladder sludge without cholelithiasis or cholecystitis. 6. Trace left pleural effusion.  Patient without improvement in Hgb after 1u PRBC transfused overnight.  Remains 6.5 this AM.  Getting 2 additional units now.  IR consulted for possible  splenic embolization.  Case reviewed by Dr. Kathlene Cote who approves patient for the procedure today.   Patient assessed at bedside.  He is able to demonstrate understanding of his current situation, however has limited insight.  Daughter at bedside notes there has been some confusion. Discussed plans for embolization and patient and daughter are agreeable to proceed.   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 ([place]) [id number]   History of Helicobacter pylori infection 2021 10/30/2022   Hypertension    IDA (iron deficiency anemia) 10/30/2022   Pancreatitis     Past Surgical History:  Procedure Laterality Date   ESOPHAGOGASTRODUODENOSCOPY (EGD) WITH PROPOFOL N/A 04/10/2021   Procedure: ESOPHAGOGASTRODUODENOSCOPY (EGD) WITH PROPOFOL;  Surgeon: [name], MD;  Location: [facility] ENDOSCOPY;  Service: Endoscopy;  Laterality: N/A;   EUS N/A 04/10/2021   Procedure: UPPER ENDOSCOPIC ULTRASOUND (EUS) LINEAR;  Surgeon: [name], MD;  Location: [facility];  Service: Endoscopy;  Laterality: N/A;    Allergies: Ethanol  Medications: Prior to Admission medications   Medication Sig Start Date End Date Taking? Authorizing Provider  NIFEdipine (ADALAT CC) 90 MG 24 hr tablet Take 90 mg by mouth daily. 10/27/22  Yes [provider]  hydrOXYzine (ATARAX) 25 MG tablet Take 25 mg by mouth 3 (three) times daily as needed for anxiety or itching. 10/10/22   [provider]  losartan (COZAAR) 100 MG tablet Take 100 mg by mouth daily. 07/25/22   [provider]  oxyCODONE (ROXICODONE) 5 MG immediate release tablet Take 1 tablet (5 mg total) by mouth every 6 (six) hours as needed. 10/23/22 10/23/23  Dwyane Dee, MD  pantoprazole (PROTONIX) 20 MG tablet Take 20 mg by mouth daily. 08/11/22  [provider]  sertraline (ZOLOFT) 25 MG tablet Take 25 mg by mouth at bedtime. 10/10/22   [provider]     History reviewed. No pertinent family history.  Social History   Socioeconomic History   Marital status: Legally Separated    Spouse name: Not on file   Number of children: Not on file   Years of education: Not on file   Highest education level: Not on file  Occupational History   Not on file  Tobacco Use   Smoking status: Every Day    Packs/day: 1.00    Types: Cigarettes   Smokeless tobacco: Never  Substance and Sexual Activity   Alcohol use: Not Currently    Comment: occ   Drug use: No   Sexual activity: Not on file  Other Topics Concern   Not on file  Social History Narrative   Not on file   Social Determinants of Health   Financial Resource Strain: Not on file  Food Insecurity: No Food Insecurity (10/19/2022)   Hunger Vital Sign    Worried About Running Out of Food in the Last Year: Never true    Ran Out of Food in the Last Year: Never true  Transportation Needs: No Transportation Needs (10/19/2022)   PRAPARE - Hydrologist (Medical): No    Lack of Transportation (Non-Medical): No  Physical Activity: Not on file  Stress: Not on file  Social Connections: Not on file     Review of Systems: A 12 point ROS discussed and pertinent positives are indicated in the HPI above.  All other systems are negative.  Review of Systems  Constitutional:  Negative for fatigue and fever.  Respiratory:  Negative for cough and shortness of breath.   Cardiovascular:  Negative for chest pain.  Gastrointestinal:  Positive for abdominal distention and abdominal pain.  Musculoskeletal:  Negative for back pain.  Psychiatric/Behavioral:  Positive for confusion. Negative for behavioral problems.     Vital Signs: BP 130/83   Pulse 84   Temp 98.8 F (37.1 C)   Resp 20   Ht '5\' 8"'$  (1.727 m)   Wt 135 lb (61.2 kg)   SpO2 100%   BMI 20.53 kg/m   Physical Exam Vitals and nursing note reviewed.  Constitutional:      General: He is not  in acute distress.    Appearance: He is well-developed. He is ill-appearing.  Cardiovascular:     Rate and Rhythm: Normal rate and regular rhythm.  Abdominal:     Tenderness: There is abdominal tenderness in the left upper quadrant.  Skin:    General: Skin is warm and dry.  Neurological:     General: No focal deficit present.     Mental Status: He is alert and oriented to person, place, and time.  Psychiatric:        Mood and Affect: Mood normal.        Behavior: Behavior normal.      MD Evaluation Airway: WNL Heart: WNL Abdomen: WNL Chest/ Lungs: WNL ASA  Classification: 3 Mallampati/Airway Score: Two   Imaging: CT Angio Abd/Pel w/ and/or w/o  Result Date: 10/30/2022 CLINICAL DATA:  Splenomegaly, history of pancreatitis, abdominal pain, evidence of hemorrhage on previous CT EXAM: CTA ABDOMEN AND PELVIS WITHOUT AND WITH CONTRAST TECHNIQUE: Multidetector CT imaging of the abdomen and pelvis was performed using the standard protocol during bolus administration of intravenous contrast. Multiplanar reconstructed images and MIPs were obtained and  reviewed to evaluate the vascular anatomy. RADIATION DOSE REDUCTION: This exam was performed according to the departmental dose-optimization program which includes automated exposure control, adjustment of the mA and/or kV according to patient size and/or use of iterative reconstruction technique. CONTRAST:  73m OMNIPAQUE IOHEXOL 350 MG/ML SOLN COMPARISON:  10/30/2022, 10/20/2022, 10/19/2022 FINDINGS: VASCULAR Aorta: Normal caliber aorta without aneurysm, dissection, vasculitis or significant stenosis. Atherosclerosis. Celiac: Patent without evidence of aneurysm, dissection, vasculitis or significant stenosis. SMA: Patent without evidence of aneurysm, dissection, vasculitis or significant stenosis. Renals: Both renal arteries are patent without evidence of aneurysm, dissection, vasculitis, fibromuscular dysplasia or significant stenosis. IMA:  Patent without evidence of aneurysm, dissection, vasculitis or significant stenosis. Inflow: Patent without evidence of aneurysm, dissection, vasculitis or significant stenosis. Proximal Outflow: Bilateral common femoral and visualized portions of the superficial and profunda femoral arteries are patent without evidence of aneurysm, dissection, vasculitis or significant stenosis. Veins: There is mild extrinsic compression upon the main portal vein due to the inflammatory changes of the pancreas. 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 Remaining venous structures are unremarkable. Review of the MIP images confirms the above findings. NON-VASCULAR Lower chest: Trace left pleural effusion. No acute airspace disease. Hepatobiliary: Stable intrahepatic and extrahepatic biliary duct dilation, with common bile duct measuring up to 11 mm. There is narrowing of the downstream common bile duct in the region of the pancreatic head. Minimal gallbladder sludge without evidence of cholecystitis. Stable benign hepatic cysts. Pancreas: Stable parenchymal calcifications within the head and body of the pancreas. There is decreased parenchymal enhancement within the region of the pancreatic head, consistent with edema and pancreatitis. Marked peripancreatic inflammatory change. There is a complex cystic structure adjacent to the tail the pancreas, measuring 4.1 x 2.6 cm, compatible with the hemorrhagic pseudocyst seen on previous imaging. No evidence of contrast extravasation to suggest active hemorrhage. Spleen: The spleen remains enlarged and heterogeneous, measuring approximately 10.0 x 7.7 by 10.5 cm in size. The diffuse heterogeneity of the splenic parenchyma seen previously is again noted, consistent with intracapsular hemorrhage. There is no evidence of contrast extravasation to suggest active bleeding. While no  distinct capsular defect is identified, significant perisplenic fat stranding in the left upper quadrant as well as the high attenuation fluid throughout the abdomen and pelvis is concerning for splenic rupture. Adrenals/Urinary Tract: Stable right renal atrophy. No urinary tract calculi or obstruction. Bladder is unremarkable. The adrenals are normal. Stomach/Bowel: No bowel obstruction or ileus. No bowel wall thickening or inflammatory change. Lymphatic: No pathologic adenopathy within the abdomen or pelvis. Reproductive: Stable appearance of the prostate. Other: High attenuation ascites throughout the abdomen and pelvis is unchanged, consistent with hemoperitoneum. No free intraperitoneal gas. No abdominal wall hernia. Musculoskeletal: No acute or destructive bony lesions. Reconstructed images demonstrate no additional findings. IMPRESSION: VASCULAR 1. No evidence of contrast extravasation to suggest active intra-abdominal or intrapelvic hemorrhage. 2. 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 3.  Aortic Atherosclerosis (ICD10-I70.0). NON-VASCULAR 1. Stable heterogeneous enlargement of the spleen, with perisplenic fat stranding and high attenuation fluid throughout the abdomen and pelvis consistent with intra splenic hemorrhage and likely capsular rupture. 2. Stable changes of pancreatitis, with heterogeneous fluid collection at the pancreatic tail consistent with hemorrhagic pseudocyst. 3. Stable high attenuation free fluid throughout the abdomen  and pelvis compatible with hemoperitoneum. 4. Stable intrahepatic and extrahepatic biliary duct dilation, with narrowing of the downstream common bile duct at the level of the pancreatic head. Please see recent MRI evaluation. 5. Gallbladder sludge without cholelithiasis or cholecystitis. 6. Trace left pleural  effusion. Electronically Signed   By: Randa Ngo M.D.   On: 10/30/2022 22:37   CT ABDOMEN PELVIS WO CONTRAST  Result Date: 10/30/2022 CLINICAL DATA:  Abdominal pain and syncope, history of pancreatitis, initial encounter EXAM: CT ABDOMEN AND PELVIS WITHOUT CONTRAST TECHNIQUE: Multidetector CT imaging of the abdomen and pelvis was performed following the standard protocol without IV contrast. RADIATION DOSE REDUCTION: This exam was performed according to the departmental dose-optimization program which includes automated exposure control, adjustment of the mA and/or kV according to patient size and/or use of iterative reconstruction technique. COMPARISON:  10/19/2022 FINDINGS: Lower chest: Lung bases are free of acute infiltrate or sizable effusion. Cardiac blood pool is decreased in attenuation consistent with underlying anemia. Hepatobiliary: Gallbladder is within normal limits. Liver shows no focal abnormality. Very mild Peri hepatic fluid is noted. Pancreas: Diffuse calcifications are noted in the head and uncinate process and to a lesser degree within the body of the pancreas consistent with chronic pancreatitis. Superimposed inflammatory change in the pancreas is noted consistent with acute on chronic pancreatitis. Persistent cystic lesion is noted within the tail of the pancreas again measuring approximately 2.4 cm. This however demonstrates increased density when compared with the recent CT likely related to some underlying hemorrhage within. Spleen: The spleen is significantly enlarged when compare with the prior exam. The previously seen perisplenic fluid collection is not well visualized. These changes are likely related to interval splenic hemorrhage. These changes would correspond with the patient's given clinical history of increased pain. Adrenals/Urinary Tract: Adrenal glands are within normal limits. Kidneys demonstrate no renal calculi or obstructive changes. Simple cyst is noted in the  upper pole of the right kidney. No further follow-up is recommended. The bladder is decompressed. Stomach/Bowel: The appendix is not well visualized. No inflammatory changes to suggest appendicitis are noted. Small bowel and stomach appear within normal limits. Vascular/Lymphatic: Aortic atherosclerosis. No enlarged abdominal or pelvic lymph nodes. Reproductive: Pancreas appears within normal limits. Other: Considerable free fluid is noted which is of greater density than that expected for free fluid and with what appears to be a hematocrit level deep in the pelvis. These changes are consistent with intra-abdominal hemorrhage. Musculoskeletal: Degenerative changes of lumbar spine are noted. No acute bony abnormality is seen. IMPRESSION: Changes consistent with persistent acute pancreatitis. There has been interval hemorrhage within the cystic lesion within the tail the pancreas when compared with the prior CT examination. The spleen is now significantly enlarged and the splenic parenchyma is not well delineated. Given the perisplenic fluid seen previously and increase in free abdominal fluid with hematocrit, these changes are felt to be related to splenic hemorrhage and likely rupture into the peritoneal cavity. Changes of underlying anemia with decreased density of the cardiac blood pool. Critical Value/emergent results were called by telephone at the time of interpretation on 10/30/2022 at 8:28 pm to Dr. Sherwood Gambler , who verbally acknowledged these results. Electronically Signed   By: Inez Catalina M.D.   On: 10/30/2022 20:37   DG Chest Portable 1 View  Result Date: 10/30/2022 CLINICAL DATA:  Syncope.  Nausea.  Vomiting. EXAM: PORTABLE CHEST 1 VIEW COMPARISON:  CXR 10/19/22 FINDINGS: No pleural effusion. No pneumothorax. No focal airspace opacity. Normal cardiac and  mediastinal contours. No radiographically apparent displaced rib fractures. Visualized upper abdomen is unremarkable. IMPRESSION: No acute  cardiopulmonary disease. Electronically Signed   By: Marin Roberts M.D.   On: 10/30/2022 18:22   MR ABDOMEN MRCP W WO CONTAST  Result Date: 10/20/2022 CLINICAL DATA:  Sequelae of chronic pancreatitis on recent CT. EXAM: MRI ABDOMEN WITHOUT AND WITH CONTRAST (INCLUDING MRCP) TECHNIQUE: Multiplanar multisequence MR imaging of the abdomen was performed both before and after the administration of intravenous contrast. Heavily T2-weighted images of the biliary and pancreatic ducts were obtained, and three-dimensional MRCP images were rendered by post processing. CONTRAST:  60m GADAVIST GADOBUTROL 1 MMOL/ML IV SOLN COMPARISON:  CTA 10/19/2022.  MRI/MRCP 02/10/2021 FINDINGS: Lower chest: Left base atelectasis with left pleural effusion. Hepatobiliary: 6 mm cyst noted in the dome of the liver with 9 mm simple cyst in the anterior right liver. No followup imaging is recommended. Gallbladder is distended with some dependent sludge but no stones evident. Intra and extrahepatic biliary duct dilatation is similar to previous MRI. Common duct measured previously at 12 mm is 11 mm today. Common bile duct just proximal to the ampulla is 10 mm today compared to 10 mm previously. As noted previously, there is abrupt tapering of the pancreatic duct in the head of the pancreas. No evidence for choledocholithiasis. Pancreas: Pancreatic head is prominent although the marked calcification seen on CT is not evident on MRI. 13 mm simple appearing cyst noted in the anterior pancreatic head, new in the interval since previous MRI. A second cyst in the tail of the pancreas measures 3.5 cm, showing thin smooth rim enhancement and no evidence for internal septation although there is some debris within the collection. Cyst is relatively homogeneous and low signal intensity on T1 with high signal intensity on T2 imaging. No main duct dilatation in the pancreas. Spleen: No splenomegaly. No focal mass lesion. Small collection of fluid posterior  to the spleen may be loculated. There is a 4.5 x 2.0 cm rim enhancing collection lateral to the spleen that extends up into the lateral aspect of the subdiaphragmatic space. Adrenals/Urinary Tract: No adrenal nodule or mass. Simple cyst noted upper pole right kidney. No followup imaging is recommended. No suspicious abnormality in the left kidney. No hydronephrosis. Stomach/Bowel: Stomach is unremarkable. No gastric wall thickening. No evidence of outlet obstruction. Duodenum is normally positioned as is the ligament of Treitz. Thickening in jejunal small bowel loops of the left upper quadrant is likely secondary. Vascular/Lymphatic: No abdominal aortic aneurysm. Small hepatoduodenal ligament and retroperitoneal lymph nodes evident. Other: Small volume ascites noted with mesenteric edema. 5.4 x 1.8 cm loculated rim enhancing collections identified lateral to the splenic flexure of the colon. Musculoskeletal: No focal suspicious marrow enhancement within the visualized bony anatomy. IMPRESSION: 1. Stable intra and extrahepatic biliary duct dilatation with abrupt tapering of the pancreatic duct in the head of the pancreas. No evidence for choledocholithiasis. No discernible obstructing mass lesion. 2. The marked pancreatic parenchymal calcification seen on CT is not evident on MRI. No main duct dilatation in the pancreas. 3. 3.5 cm cyst in the tail of the pancreas showing thin smooth rim enhancement and some debris within the collection. A second 13 mm cyst identified in the head of the pancreas. These are likely pseudocysts although superinfection cannot be excluded. 4. 4.5 x 2.0 cm rim enhancing fluid collection identified lateral to the spleen with probable loculated fluid posterior to the spleen as well. An additional 5.4 x 1.8 cm loculated rim  enhancing collection identified lateral to the splenic flexure of the colon. These may represent pseudocysts although abscess is a possibility. 5. Distended gallbladder  with layering sludge. No gallbladder wall thickening or evidence of gallstones. 6. Small volume ascites with mesenteric edema. 7. Left base atelectasis with left pleural effusion. 8. Thickening in jejunal small bowel loops of the left upper quadrant is likely secondary. Electronically Signed   By: Misty Stanley M.D.   On: 10/20/2022 08:40   MR 3D Recon At Scanner  Result Date: 10/20/2022 CLINICAL DATA:  Sequelae of chronic pancreatitis on recent CT. EXAM: MRI ABDOMEN WITHOUT AND WITH CONTRAST (INCLUDING MRCP) TECHNIQUE: Multiplanar multisequence MR imaging of the abdomen was performed both before and after the administration of intravenous contrast. Heavily T2-weighted images of the biliary and pancreatic ducts were obtained, and three-dimensional MRCP images were rendered by post processing. CONTRAST:  12m GADAVIST GADOBUTROL 1 MMOL/ML IV SOLN COMPARISON:  CTA 10/19/2022.  MRI/MRCP 02/10/2021 FINDINGS: Lower chest: Left base atelectasis with left pleural effusion. Hepatobiliary: 6 mm cyst noted in the dome of the liver with 9 mm simple cyst in the anterior right liver. No followup imaging is recommended. Gallbladder is distended with some dependent sludge but no stones evident. Intra and extrahepatic biliary duct dilatation is similar to previous MRI. Common duct measured previously at 12 mm is 11 mm today. Common bile duct just proximal to the ampulla is 10 mm today compared to 10 mm previously. As noted previously, there is abrupt tapering of the pancreatic duct in the head of the pancreas. No evidence for choledocholithiasis. Pancreas: Pancreatic head is prominent although the marked calcification seen on CT is not evident on MRI. 13 mm simple appearing cyst noted in the anterior pancreatic head, new in the interval since previous MRI. A second cyst in the tail of the pancreas measures 3.5 cm, showing thin smooth rim enhancement and no evidence for internal septation although there is some debris within the  collection. Cyst is relatively homogeneous and low signal intensity on T1 with high signal intensity on T2 imaging. No main duct dilatation in the pancreas. Spleen: No splenomegaly. No focal mass lesion. Small collection of fluid posterior to the spleen may be loculated. There is a 4.5 x 2.0 cm rim enhancing collection lateral to the spleen that extends up into the lateral aspect of the subdiaphragmatic space. Adrenals/Urinary Tract: No adrenal nodule or mass. Simple cyst noted upper pole right kidney. No followup imaging is recommended. No suspicious abnormality in the left kidney. No hydronephrosis. Stomach/Bowel: Stomach is unremarkable. No gastric wall thickening. No evidence of outlet obstruction. Duodenum is normally positioned as is the ligament of Treitz. Thickening in jejunal small bowel loops of the left upper quadrant is likely secondary. Vascular/Lymphatic: No abdominal aortic aneurysm. Small hepatoduodenal ligament and retroperitoneal lymph nodes evident. Other: Small volume ascites noted with mesenteric edema. 5.4 x 1.8 cm loculated rim enhancing collections identified lateral to the splenic flexure of the colon. Musculoskeletal: No focal suspicious marrow enhancement within the visualized bony anatomy. IMPRESSION: 1. Stable intra and extrahepatic biliary duct dilatation with abrupt tapering of the pancreatic duct in the head of the pancreas. No evidence for choledocholithiasis. No discernible obstructing mass lesion. 2. The marked pancreatic parenchymal calcification seen on CT is not evident on MRI. No main duct dilatation in the pancreas. 3. 3.5 cm cyst in the tail of the pancreas showing thin smooth rim enhancement and some debris within the collection. A second 13 mm cyst identified in the  head of the pancreas. These are likely pseudocysts although superinfection cannot be excluded. 4. 4.5 x 2.0 cm rim enhancing fluid collection identified lateral to the spleen with probable loculated fluid  posterior to the spleen as well. An additional 5.4 x 1.8 cm loculated rim enhancing collection identified lateral to the splenic flexure of the colon. These may represent pseudocysts although abscess is a possibility. 5. Distended gallbladder with layering sludge. No gallbladder wall thickening or evidence of gallstones. 6. Small volume ascites with mesenteric edema. 7. Left base atelectasis with left pleural effusion. 8. Thickening in jejunal small bowel loops of the left upper quadrant is likely secondary. Electronically Signed   By: Misty Stanley M.D.   On: 10/20/2022 08:40   US Abdomen Limited RUQ (LIVER/GB)  Result Date: 10/19/2022 CLINICAL DATA:  Epigastric pain EXAM: ULTRASOUND ABDOMEN LIMITED RIGHT UPPER QUADRANT COMPARISON:  CT angiogram chest abdomen and pelvis 10/19/2022 FINDINGS: Gallbladder: No gallstones or wall thickening visualized. Gallbladder is dilated. Gallbladder sludge is present. No sonographic Murphy sign noted by sonographer. Common bile duct: Diameter: 10 mm. There is also mild-to-moderate intrahepatic biliary ductal dilatation. Liver: Two hepatic cysts are identified. Right lobe cyst measures 11 x 10 x 11 mm. Left lobe cyst measures 11 x 7 x 8 mm. Within normal limits in parenchymal echogenicity. Portal vein is patent on color Doppler imaging with normal direction of blood flow towards the liver. Other: Incidental right renal simple cyst measuring 2.2 x 1.7 x 3.4 cm. IMPRESSION: 1. Gallbladder hydrops with gallbladder sludge. No gallstones or wall thickening. 2. Dilated common bile duct with mild-to-moderate intrahepatic biliary ductal dilatation. Recommend further evaluation with MRCP. Electronically Signed   By: Ronney Asters M.D.   On: 10/19/2022 21:06   CT Angio Chest/Abd/Pel for Dissection W and/or W/WO  Result Date: 10/19/2022 CLINICAL DATA:  Concern for aerated aneurysm. Left-sided chest pain. EXAM: CT ANGIOGRAPHY CHEST, ABDOMEN AND PELVIS TECHNIQUE: Non-contrast CT of the  chest was initially obtained. Multidetector CT imaging through the chest, abdomen and pelvis was performed using the standard protocol during bolus administration of intravenous contrast. Multiplanar reconstructed images and MIPs were obtained and reviewed to evaluate the vascular anatomy. RADIATION DOSE REDUCTION: This exam was performed according to the departmental dose-optimization program which includes automated exposure control, adjustment of the mA and/or kV according to patient size and/or use of iterative reconstruction technique. CONTRAST:  162m OMNIPAQUE IOHEXOL 350 MG/ML SOLN COMPARISON:  CT abdomen pelvis dated 01/04/2022. FINDINGS: Evaluation is limited due to streak artifact caused by patient's arms. Evaluation is also limited due to anasarca and paucity of intra-abdominal fat. CTA CHEST FINDINGS Cardiovascular: There is no cardiomegaly or pericardial effusion. There is mild atherosclerotic calcification of the thoracic aorta. No aneurysmal dilatation or dissection. The aorta is tortuous. The origins of the great vessels of the aortic arch appear patent. No pulmonary artery embolus identified. Mediastinum/Nodes: No hilar or mediastinal adenopathy. The esophagus is grossly unremarkable. No mediastinal fluid collection. Lungs/Pleura: Small left pleural effusion with partial compressive atelectasis of the left versus pneumonia. There is background of centrilobular emphysema. There is no pneumothorax. The central airways are patent. Musculoskeletal: Degenerative changes of the spine. No acute osseous pathology. Review of the MIP images confirms the above findings. CTA ABDOMEN AND PELVIS FINDINGS VASCULAR Aorta: Mild atherosclerotic calcification of the abdominal aorta. No aneurysmal dilatation or dissection. Celiac: Patent without evidence of aneurysm, dissection, vasculitis or significant stenosis. SMA: Patent without evidence of aneurysm, dissection, vasculitis or significant stenosis. Renals: Both  renal arteries  are patent without evidence of aneurysm, dissection, vasculitis, fibromuscular dysplasia or significant stenosis. IMA: Patent without evidence of aneurysm, dissection, vasculitis or significant stenosis. Inflow: Mild atherosclerotic calcification of the iliac arteries. The iliac arteries are patent. No aneurysmal dilatation or dissection. Veins: No obvious venous abnormality within the limitations of this arterial phase study. Review of the MIP images confirms the above findings. NON-VASCULAR No intra-abdominal free air.  Small free fluid within the pelvis. Hepatobiliary: Several small liver cysts, too small to characterize. There is mild dilatation of the biliary tree. No calcified stone within the gallbladder. Pancreas: There is coarse calcification of the head and uncinate process of the pancreas sequela of chronic pancreatitis. Correlation with pancreatic enzymes recommended to evaluate for for possibility of acute on chronic pancreatitis. There is a 3.3 x 2.6 cm cyst in the tail of the pancreas most consistent with a pseudocyst. Attention on follow-up imaging recommended. Spleen: The spleen is unremarkable. There is perisplenic free fluid which appears somewhat loculated, likely sequela of pancreatitis. This fluid may represent a pseudocyst. Adrenals/Urinary Tract: The adrenal glands are poorly visualized. There is a small right renal upper pole cyst. There is no hydronephrosis on either side. The urinary bladder is grossly unremarkable. Stomach/Bowel: There is moderate stool throughout the colon. There is no bowel obstruction. Lymphatic: No adenopathy. Reproductive: The prostate gland is mildly enlarged measuring 4.5 cm in transverse axial diameter. The seminal vesicles are symmetric. Other: Diffuse mesenteric and subcutaneous edema and anasarca. Musculoskeletal: Degenerative changes of the spine. No acute osseous pathology. Review of the MIP images confirms the above findings. IMPRESSION: 1. No  aortic aneurysm or dissection. 2. Small left pleural effusion with partial compressive atelectasis of the left versus pneumonia. 3. Sequela of chronic pancreatitis including pancreatic head calcification pancreatic tail pseudocyst. Correlation with pancreatic enzymes recommended to evaluate for possibility of acute on chronic pancreatitis. 4. Perisplenic fluid collection most consistent with post inflammatory collection or developing pseudocysts. An abscess sign report cyst is not excluded. 5. No bowel obstruction. 6. Aortic Atherosclerosis (ICD10-I70.0) and Emphysema (ICD10-J43.9). Electronically Signed   By: Anner Crete M.D.   On: 10/19/2022 17:41   DG Chest 2 View  Result Date: 10/19/2022 CLINICAL DATA:  Chest pain EXAM: CHEST - 2 VIEW COMPARISON:  01/04/2022 FINDINGS: Asymmetric elevation left hemidiaphragm. Trace atelectasis noted left base with possible tiny left pleural effusion. Right lung clear. The cardiopericardial silhouette is within normal limits for size. The visualized bony structures of the thorax are unremarkable. IMPRESSION: Asymmetric elevation left hemidiaphragm with trace left base atelectasis and possible tiny left pleural effusion. Electronically Signed   By: Misty Stanley M.D.   On: 10/19/2022 13:41    Labs:  CBC: Recent Labs    10/22/22 0209 10/23/22 0032 10/30/22 1819 10/31/22 0516  WBC 9.3 9.6 19.4* 18.5*  HGB 7.8* 7.5* 6.1* 6.5*  HCT 23.1* 22.4* 18.2* 18.9*  PLT 298 290 354 256    COAGS: Recent Labs    10/30/22 2311  INR 1.2    BMP: Recent Labs    10/21/22 0126 10/22/22 0209 10/23/22 0032 10/30/22 1819  NA 136 134* 131* 130*  K 3.8 3.5 3.2* 4.6  CL 107 101 102 103  CO2 21* 23 23 18*  GLUCOSE 94 104* 110* 142*  BUN '19 13 8 '$ 29*  CALCIUM 8.1* 8.2* 7.8* 7.5*  CREATININE 0.91 0.88 0.85 2.27*  GFRNONAA >60 >60 >60 30*    LIVER FUNCTION TESTS: Recent Labs    10/21/22 0126 10/22/22 0209 10/23/22  0032 10/30/22 1819  BILITOT 5.0* 4.5* 3.7*  4.1*  AST 39 53* 46* 55*  ALT 34 38 36 59*  ALKPHOS 135* 146* 137* 164*  PROT 5.4* 5.5* 5.4* 6.5  ALBUMIN 1.8* 1.8* 1.8* 2.4*    TUMOR MARKERS: No results for input(s): "AFPTM", "CEA", "CA199", "CHROMGRNA" in the last 8760 hours.  Assessment and Plan: Splenic rupture Patient with history of chronic pancreatitis, now with worsening LUQ pain and CT imaging concerning for splenic hemorrhage.  He is getting blood transfusions and fluid resuscitation.  Likely with acute renal failure in the setting of blood loss.  Cr 2.27, GFR 30.   Plan to proceed with embolization with minimal contrast use as able. RN to place a second IV and run NS @ 75 mL/hr alongside blood transfusion. Will likely get 2nd unit upon return to ED.  He is NPO.  Not on blood thinners.   The Risks and benefits of embolization were discussed with the patient including, but not limited to bleeding, infection, vascular injury, post operative pain, or contrast induced renal failure.  This procedure involves the use of X-rays and because of the nature of the planned procedure, it is possible that we will have prolonged use of X-ray fluoroscopy.  Potential radiation risks to you include (but are not limited to) the following: - A slightly elevated risk for cancer several years later in life. This risk is typically less than 0.5% percent. This risk is low in comparison to the normal incidence of human cancer, which is 33% for women and 50% for men according to the [place]. - Radiation induced injury can include skin redness, resembling a rash, tissue breakdown / ulcers and hair loss (which can be temporary or permanent).   The likelihood of either of these occurring depends on the difficulty of the procedure and whether you are sensitive to radiation due to previous procedures, disease, or genetic conditions.   IF your procedure requires a prolonged use of radiation, you will be notified and given written  instructions for further action.  It is your responsibility to monitor the irradiated area for the 2 weeks following the procedure and to notify your physician if you are concerned that you have suffered a radiation induced injury.    All of the patient's questions were answered, patient is agreeable to proceed. Consent signed and in chart.  Thank you for this interesting consult.  I greatly enjoyed meeting Jared Duran and look forward to participating in their care.  A copy of this report was sent to the requesting provider on this date.  Electronically Signed: Docia Barrier, PA 10/31/2022, 9:23 AM   I spent a total of 40 Minutes    in face to face in clinical consultation, greater than 50% of which was counseling/coordinating care for splenic hemorrhage.

## 2022-10-31 NOTE — Progress Notes (Signed)
Jared Duran [id number] 11/22/1950  CARE TEAM:  PCP: [name], MD  Outpatient Care Team: Patient Care Team: [name], MD as PCP - General (Family Medicine) [name], MD as Consulting Physician (Gastroenterology) [name], MD as Referring Physician (Surgery)  Inpatient Treatment Team: Treatment Team: Attending Provider: Shelly Coss, MD; Consulting Physician: [name], Md, MD; Consulting Physician: [name], MD; Rounding Team: Redmond Baseman, MD; Technician: Archie Balboa, NT; Registered Nurse: Lillia Mountain, RN; Utilization Review: Hetty Ely, RN; Rounding Team: D[facility], MD   Problem List:   Principal Problem:   Splenic hemorrhage Active Problems:   Chronic pancreatitis due to chronic alcoholism ([place])   Acute pancreatitis   Fatty liver, alcoholic   ARF (acute renal failure) (HCC)   LFT elevation   Alcohol abuse, in remission   IDA (iron deficiency anemia)   Spleen hematoma   Syncope, vasovagal   Hiatal hernia   Pancreatic calcification   CKD (chronic kidney disease) stage 2, GFR 60-89 ml/min   Hyperglycemia   Nausea & vomiting      * No surgery found *      Assessment  Probable splenic hematoma/laceration with history of fall raises suspicion of traumatic etiology versus spontaneous rupture in the setting of severe acute on chronic pancreatitis and probable cirrhosis.  [facility] Stay = 1 days)  Plan:  Continue bedrest per trauma protocol.  Perhaps can reconsider mobilization if hemoglobin stable for 24/48 hours.  Hemoglobin still less than 7 with 1 unit.  Transfer to more units.  Hold any chemical VTE prophylaxis/anticoagulation.  Will follow clinically.  CT scan reassuring that there is no active extravasation at this point.  However may get interventional radiology involved to see if some coiling/embolization may be helpful before proceeding to splenectomy.  I worry his operative  risks are increased since I suspect he has some portal hypertension is definitely deconditioned from his severe pancreatitis.  Surgery will help follow.    Disposition: Determined by primary service.  Suspect he will need an observation for several days at the very least.       I reviewed nursing notes, ED provider notes, last 24 h vitals and pain scores, last 48 h intake and output, last 24 h labs and trends, and last 24 h imaging results. I have reviewed this patient's available data, including medical history, events of note, test results, etc as part of my evaluation.  A significant portion of that time was spent in counseling.  Care during the described time interval was provided by me.  This care required moderate level of medical decision making.  10/31/2022    Subjective: (Chief complaint)  Patient really denies abdominal pain.  You are nursing at bedside.  I have reordered blood.  They are waiting from blood bank to get second unit going.  Not on pressors.  Objective:  Vital signs:  Vitals:   10/31/22 0740 10/31/22 0750 10/31/22 0800 10/31/22 0810  BP: 116/76 119/80  115/82  Pulse: 88 83 84 88  Resp: '19 18 19 19  '$ Temp: 98.8 F (37.1 C)     TempSrc: Oral     SpO2: 100% 100% 100% 100%  Weight:      Height:           Intake/Output   Yesterday:  02/22 0701 - 02/23 0700 In: 2427.1 [I.V.:28.7; Blood:300; IV Piggyback:2098.4] Out: -  This shift:  No intake/output data recorded.  Bowel function:  Flatus: No  BM:  No  Drain: (No drain)   Physical Exam:  General: Pt awake/alert in no acute distress.  Chatty but not delirious/belligerent. Eyes: PERRL, normal EOM.  Sclera clear.  No icterus Neuro: CN II-XII intact w/o focal sensory/motor deficits. Lymph: No head/neck/groin lymphadenopathy Psych:  No delerium/psychosis/paranoia.  Oriented x 4 HENT: Normocephalic, Mucus membranes moist.  No thrush Neck: Supple, No tracheal deviation.  No obvious  thyromegaly Chest: No pain to chest wall compression.  Good respiratory excursion.  No audible wheezing no tenderness palpation or step-off concerning for rib fracture or hematoma. CV:  Pulses intact.  Regular rhythm.  No major extremity edema MS: Normal AROM mjr joints.  No obvious deformity  Abdomen: Mostly firm.  Very distended.  Nontender.  No evidence of peritonitis.  No incarcerated hernias.  Ext:   No deformity.  No mjr edema.  No cyanosis Skin: No petechiae / purpurea.  No major sores.  Warm and dry    Results:   Cultures: No results found for this or any previous visit (from the past 720 hour(s)).  Labs: Results for orders placed or performed during the hospital encounter of 10/30/22 (from the past 48 hour(s))  Comprehensive metabolic panel     Status: Abnormal   Collection Time: 10/30/22  6:19 PM  Result Value Ref Range   Sodium 130 (L) 135 - 145 mmol/L   Potassium 4.6 3.5 - 5.1 mmol/L    Comment: HEMOLYSIS AT THIS LEVEL MAY AFFECT RESULT   Chloride 103 98 - 111 mmol/L   CO2 18 (L) 22 - 32 mmol/L   Glucose, Bld 142 (H) 70 - 99 mg/dL    Comment: Glucose reference range applies only to samples taken after fasting for at least 8 hours.   BUN 29 (H) 8 - 23 mg/dL   Creatinine, Ser 2.27 (H) 0.61 - 1.24 mg/dL   Calcium 7.5 (L) 8.9 - 10.3 mg/dL   Total Protein 6.5 6.5 - 8.1 g/dL   Albumin 2.4 (L) 3.5 - 5.0 g/dL   AST 55 (H) 15 - 41 U/L    Comment: HEMOLYSIS AT THIS LEVEL MAY AFFECT RESULT   ALT 59 (H) 0 - 44 U/L    Comment: HEMOLYSIS AT THIS LEVEL MAY AFFECT RESULT   Alkaline Phosphatase 164 (H) 38 - 126 U/L   Total Bilirubin 4.1 (H) 0.3 - 1.2 mg/dL    Comment: HEMOLYSIS AT THIS LEVEL MAY AFFECT RESULT   GFR, Estimated 30 (L) >60 mL/min    Comment: (NOTE) Calculated using the CKD-EPI Creatinine Equation (2021)    Anion gap 9 5 - 15    Comment: Performed at [facility], [place] [address] [place], [place] [address]  Lipase, blood     Status: Abnormal    Collection Time: 10/30/22  6:19 PM  Result Value Ref Range   Lipase 276 (H) 11 - 51 U/L    Comment: Performed at [facility], [place] [address] [place], [place] [address]  Troponin I (High Sensitivity)     Status: None   Collection Time: 10/30/22  6:19 PM  Result Value Ref Range   Troponin I (High Sensitivity) 8 <18 ng/L    Comment: (NOTE) Elevated high sensitivity troponin I (hsTnI) values and significant  changes across serial measurements may suggest ACS but many other  chronic and acute conditions are known to elevate hsTnI results.  Refer to the "Links" section for chest pain algorithms and additional  guidance. Performed at [facility], [place]  [address] [place], [place] [address]   CBC with Differential     Status: Abnormal   Collection Time: 10/30/22  6:19 PM  Result Value Ref Range   WBC 19.4 (H) 4.0 - 10.5 K/uL   RBC 1.81 (L) 4.22 - 5.81 MIL/uL   Hemoglobin 6.1 (LL) 13.0 - 17.0 g/dL    Comment: REPEATED TO VERIFY THIS CRITICAL RESULT HAS VERIFIED AND BEEN CALLED TO BERRIER,CANDICE RN BY MARINDA BLACK ON 02 22 2024 AT 1918, AND HAS BEEN READ BACK. CRITICAL HGB CALLED TO BERRIER,C RN    HCT 18.2 (L) 39.0 - 52.0 %   MCV 100.6 (H) 80.0 - 100.0 fL   MCH 33.7 26.0 - 34.0 pg   MCHC 33.5 30.0 - 36.0 g/dL   RDW 17.0 (H) 11.5 - 15.5 %   Platelets 354 150 - 400 K/uL   nRBC 0.0 0.0 - 0.2 %   Neutrophils Relative % 82 %   Neutro Abs 15.8 (H) 1.7 - 7.7 K/uL   Lymphocytes Relative 8 %   Lymphs Abs 1.6 0.7 - 4.0 K/uL   Monocytes Relative 9 %   Monocytes Absolute 1.8 (H) 0.1 - 1.0 K/uL   Eosinophils Relative 0 %   Eosinophils Absolute 0.0 0.0 - 0.5 K/uL   Basophils Relative 0 %   Basophils Absolute 0.0 0.0 - 0.1 K/uL   Immature Granulocytes 1 %   Abs Immature Granulocytes 0.13 (H) 0.00 - 0.07 K/uL    Comment: Performed at [facility], [place] [address], [place], [place] [address]  POC occult blood, ED     Status: None    Collection Time: 10/30/22  7:35 PM  Result Value Ref Range   Fecal Occult Bld NEGATIVE NEGATIVE  Type and screen [place]     Status: None (Preliminary result)   Collection Time: 10/30/22  8:31 PM  Result Value Ref Range   ABO/RH(D) O POS    Antibody Screen NEG    Sample Expiration      11/02/2022,2359 Performed at [facility], [place] [address] [place], [place] [address]    Unit Number [id number]    Blood Component Type RED CELLS,LR    Unit division 00    Status of Unit ISSUED    Transfusion Status OK TO TRANSFUSE    Crossmatch Result Compatible    Unit Number [id number]    Blood Component Type RED CELLS,LR    Unit division 00    Status of Unit ISSUED    Transfusion Status OK TO TRANSFUSE    Crossmatch Result Compatible    Unit Number [id number]    Blood Component Type RBC LR PHER1    Unit division 00    Status of Unit ALLOCATED    Transfusion Status OK TO TRANSFUSE    Crossmatch Result Compatible    Unit Number [id number]    Blood Component Type RED CELLS,LR    Unit division 00    Status of Unit ALLOCATED    Transfusion Status OK TO TRANSFUSE    Crossmatch Result Compatible   Prepare RBC (crossmatch)     Status: None   Collection Time: 10/30/22  8:31 PM  Result Value Ref Range   Order Confirmation      ORDER PROCESSED BY BLOOD BANK Performed at [facility], [place] [address], [place], [place] [address]   Troponin I (High Sensitivity)     Status: None   Collection Time: 10/30/22  8:31 PM  Result Value  Ref Range   Troponin I (High Sensitivity) 11 <18 ng/L    Comment: (NOTE) Elevated high sensitivity troponin I (hsTnI) values and significant  changes across serial measurements may suggest ACS but many other  chronic and acute conditions are known to elevate hsTnI results.  Refer to the "Links" section for chest pain algorithms and additional  guidance. Performed at [facility],  [place] [address] [place], [place] [address]   Vitamin B12     Status: None   Collection Time: 10/30/22  8:31 PM  Result Value Ref Range   Vitamin B-12 451 180 - 914 pg/mL    Comment: (NOTE) This assay is not validated for testing neonatal or myeloproliferative syndrome specimens for Vitamin B12 levels. Performed at [facility], [place] [address], [place], [place] [address]   Folate     Status: None   Collection Time: 10/30/22  8:31 PM  Result Value Ref Range   Folate 8.6 >5.9 ng/mL    Comment: Performed at [facility], [place] [address], [place], [place] [address]  Iron and TIBC     Status: Abnormal   Collection Time: 10/30/22  8:31 PM  Result Value Ref Range   Iron 44 (L) 45 - 182 ug/dL   TIBC 290 250 - 450 ug/dL   Saturation Ratios 15 (L) 17.9 - 39.5 %   UIBC 246 ug/dL    Comment: Performed at [facility], [place] [address], [place], [place] [address]  Ferritin     Status: Abnormal   Collection Time: 10/30/22  8:31 PM  Result Value Ref Range   Ferritin 618 (H) 24 - 336 ng/mL    Comment: Performed at [facility], [place] [address], [place], [place] [address]  Reticulocytes     Status: Abnormal   Collection Time: 10/30/22  8:31 PM  Result Value Ref Range   Retic Ct Pct 6.1 (H) 0.4 - 3.1 %   RBC. 1.70 (L) 4.22 - 5.81 MIL/uL   Retic Count, Absolute 103.5 19.0 - 186.0 K/uL   Immature Retic Fract 29.2 (H) 2.3 - 15.9 %    Comment: Performed at [facility], [place] [address] [place], [place] [address]  ABO/Rh     Status: None   Collection Time: 10/30/22  8:32 PM  Result Value Ref Range   ABO/RH(D)      O POS Performed at [facility], [place] [address] [place], [place] [address]   Protime-INR     Status: None   Collection Time: 10/30/22 11:11 PM  Result Value Ref Range   Prothrombin Time 15.2 11.4 - 15.2 seconds   INR 1.2 0.8 - 1.2    Comment: (NOTE) INR goal varies  based on device and disease states. Performed at [facility], [place] [address] [place], [place] [address]   CBC     Status: Abnormal   Collection Time: 10/31/22  5:16 AM  Result Value Ref Range   WBC 18.5 (H) 4.0 - 10.5 K/uL   RBC 1.98 (L) 4.22 - 5.81 MIL/uL   Hemoglobin 6.5 (LL) 13.0 - 17.0 g/dL    Comment: CRITICAL VALUE NOTED.  VALUE IS CONSISTENT WITH PREVIOUSLY REPORTED AND CALLED VALUE. REPEATED TO VERIFY    HCT 18.9 (L) 39.0 - 52.0 %   MCV 95.5 80.0 - 100.0 fL   MCH 32.8 26.0 - 34.0 pg   MCHC 34.4 30.0 - 36.0 g/dL   RDW 19.4 (  H) 11.5 - 15.5 %   Platelets 256 150 - 400 K/uL   nRBC 0.0 0.0 - 0.2 %    Comment: Performed at [facility], [place] [address] [place], [place] [address]  Prepare RBC (crossmatch)     Status: None   Collection Time: 10/31/22  6:00 AM  Result Value Ref Range   Order Confirmation      ORDER PROCESSED BY BLOOD BANK Performed at [facility], [place] [address], [place], [place] [address]   CBG monitoring, ED     Status: Abnormal   Collection Time: 10/31/22  6:41 AM  Result Value Ref Range   Glucose-Capillary 137 (H) 70 - 99 mg/dL    Comment: Glucose reference range applies only to samples taken after fasting for at least 8 hours.   Comment 1 Notify RN   Urinalysis, Routine w reflex microscopic -Urine, Clean Catch     Status: Abnormal   Collection Time: 10/31/22  6:43 AM  Result Value Ref Range   Color, Urine AMBER (A) YELLOW    Comment: BIOCHEMICALS MAY BE AFFECTED BY COLOR   APPearance CLEAR CLEAR   Specific Gravity, Urine 1.036 (H) 1.005 - 1.030   pH 5.0 5.0 - 8.0   Glucose, UA NEGATIVE NEGATIVE mg/dL   Hgb urine dipstick NEGATIVE NEGATIVE   Bilirubin Urine NEGATIVE NEGATIVE   Ketones, ur NEGATIVE NEGATIVE mg/dL   Protein, ur 30 (A) NEGATIVE mg/dL   Nitrite NEGATIVE NEGATIVE   Leukocytes,Ua NEGATIVE NEGATIVE   RBC / HPF 0-5 0 - 5 RBC/hpf   WBC, UA 0-5 0 - 5 WBC/hpf   Bacteria, UA RARE  (A) NONE SEEN   Squamous Epithelial / HPF 0-5 0 - 5 /HPF   Mucus PRESENT    Hyaline Casts, UA PRESENT     Comment: Performed at [facility], [place] [address] [place], [place] [address]    Imaging / Studies: CT Angio Abd/Pel w/ and/or w/o  Result Date: 10/30/2022 CLINICAL DATA:  Splenomegaly, history of pancreatitis, abdominal pain, evidence of hemorrhage on previous CT EXAM: CTA ABDOMEN AND PELVIS WITHOUT AND WITH CONTRAST TECHNIQUE: Multidetector CT imaging of the abdomen and pelvis was performed using the standard protocol during bolus administration of intravenous contrast. Multiplanar reconstructed images and MIPs were obtained and reviewed to evaluate the vascular anatomy. RADIATION DOSE REDUCTION: This exam was performed according to the departmental dose-optimization program which includes automated exposure control, adjustment of the mA and/or kV according to patient size and/or use of iterative reconstruction technique. CONTRAST:  59m OMNIPAQUE IOHEXOL 350 MG/ML SOLN COMPARISON:  10/30/2022, 10/20/2022, 10/19/2022 FINDINGS: VASCULAR Aorta: Normal caliber aorta without aneurysm, dissection, vasculitis or significant stenosis. Atherosclerosis. Celiac: Patent without evidence of aneurysm, dissection, vasculitis or significant stenosis. SMA: Patent without evidence of aneurysm, dissection, vasculitis or significant stenosis. Renals: Both renal arteries are patent without evidence of aneurysm, dissection, vasculitis, fibromuscular dysplasia or significant stenosis. IMA: Patent without evidence of aneurysm, dissection, vasculitis or significant stenosis. Inflow: Patent without evidence of aneurysm, dissection, vasculitis or significant stenosis. Proximal Outflow: Bilateral common femoral and visualized portions of the superficial and profunda femoral arteries are patent without evidence of aneurysm, dissection, vasculitis or significant stenosis. Veins: There is mild extrinsic  compression upon the main portal vein due to the inflammatory changes of the pancreas. 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 Remaining venous structures are unremarkable. Review of the  MIP images confirms the above findings. NON-VASCULAR Lower chest: Trace left pleural effusion. No acute airspace disease. Hepatobiliary: Stable intrahepatic and extrahepatic biliary duct dilation, with common bile duct measuring up to 11 mm. There is narrowing of the downstream common bile duct in the region of the pancreatic head. Minimal gallbladder sludge without evidence of cholecystitis. Stable benign hepatic cysts. Pancreas: Stable parenchymal calcifications within the head and body of the pancreas. There is decreased parenchymal enhancement within the region of the pancreatic head, consistent with edema and pancreatitis. Marked peripancreatic inflammatory change. There is a complex cystic structure adjacent to the tail the pancreas, measuring 4.1 x 2.6 cm, compatible with the hemorrhagic pseudocyst seen on previous imaging. No evidence of contrast extravasation to suggest active hemorrhage. Spleen: The spleen remains enlarged and heterogeneous, measuring approximately 10.0 x 7.7 by 10.5 cm in size. The diffuse heterogeneity of the splenic parenchyma seen previously is again noted, consistent with intracapsular hemorrhage. There is no evidence of contrast extravasation to suggest active bleeding. While no distinct capsular defect is identified, significant perisplenic fat stranding in the left upper quadrant as well as the high attenuation fluid throughout the abdomen and pelvis is concerning for splenic rupture. Adrenals/Urinary Tract: Stable right renal atrophy. No urinary tract calculi or obstruction. Bladder is unremarkable. The adrenals are normal. Stomach/Bowel: No bowel obstruction or ileus. No bowel wall  thickening or inflammatory change. Lymphatic: No pathologic adenopathy within the abdomen or pelvis. Reproductive: Stable appearance of the prostate. Other: High attenuation ascites throughout the abdomen and pelvis is unchanged, consistent with hemoperitoneum. No free intraperitoneal gas. No abdominal wall hernia. Musculoskeletal: No acute or destructive bony lesions. Reconstructed images demonstrate no additional findings. IMPRESSION: VASCULAR 1. No evidence of contrast extravasation to suggest active intra-abdominal or intrapelvic hemorrhage. 2. 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 3.  Aortic Atherosclerosis (ICD10-I70.0). NON-VASCULAR 1. Stable heterogeneous enlargement of the spleen, with perisplenic fat stranding and high attenuation fluid throughout the abdomen and pelvis consistent with intra splenic hemorrhage and likely capsular rupture. 2. Stable changes of pancreatitis, with heterogeneous fluid collection at the pancreatic tail consistent with hemorrhagic pseudocyst. 3. Stable high attenuation free fluid throughout the abdomen and pelvis compatible with hemoperitoneum. 4. Stable intrahepatic and extrahepatic biliary duct dilation, with narrowing of the downstream common bile duct at the level of the pancreatic head. Please see recent MRI evaluation. 5. Gallbladder sludge without cholelithiasis or cholecystitis. 6. Trace left pleural effusion. Electronically Signed   By: Randa Ngo M.D.   On: 10/30/2022 22:37   CT ABDOMEN PELVIS WO CONTRAST  Result Date: 10/30/2022 CLINICAL DATA:  Abdominal pain and syncope, history of pancreatitis, initial encounter EXAM: CT ABDOMEN AND PELVIS WITHOUT CONTRAST TECHNIQUE: Multidetector CT imaging of the abdomen and pelvis was performed following the standard protocol without IV contrast. RADIATION DOSE  REDUCTION: This exam was performed according to the departmental dose-optimization program which includes automated exposure control, adjustment of the mA and/or kV according to patient size and/or use of iterative reconstruction technique. COMPARISON:  10/19/2022 FINDINGS: Lower chest: Lung bases are free of acute infiltrate or sizable effusion. Cardiac blood pool is decreased in attenuation consistent with underlying anemia. Hepatobiliary: Gallbladder is within normal limits. Liver shows no focal abnormality. Very mild Peri hepatic fluid is noted. Pancreas: Diffuse calcifications are noted in the head and uncinate process and to a lesser degree within  the body of the pancreas consistent with chronic pancreatitis. Superimposed inflammatory change in the pancreas is noted consistent with acute on chronic pancreatitis. Persistent cystic lesion is noted within the tail of the pancreas again measuring approximately 2.4 cm. This however demonstrates increased density when compared with the recent CT likely related to some underlying hemorrhage within. Spleen: The spleen is significantly enlarged when compare with the prior exam. The previously seen perisplenic fluid collection is not well visualized. These changes are likely related to interval splenic hemorrhage. These changes would correspond with the patient's given clinical history of increased pain. Adrenals/Urinary Tract: Adrenal glands are within normal limits. Kidneys demonstrate no renal calculi or obstructive changes. Simple cyst is noted in the upper pole of the right kidney. No further follow-up is recommended. The bladder is decompressed. Stomach/Bowel: The appendix is not well visualized. No inflammatory changes to suggest appendicitis are noted. Small bowel and stomach appear within normal limits. Vascular/Lymphatic: Aortic atherosclerosis. No enlarged abdominal or pelvic lymph nodes. Reproductive: Pancreas appears within normal limits. Other:  Considerable free fluid is noted which is of greater density than that expected for free fluid and with what appears to be a hematocrit level deep in the pelvis. These changes are consistent with intra-abdominal hemorrhage. Musculoskeletal: Degenerative changes of lumbar spine are noted. No acute bony abnormality is seen. IMPRESSION: Changes consistent with persistent acute pancreatitis. There has been interval hemorrhage within the cystic lesion within the tail the pancreas when compared with the prior CT examination. The spleen is now significantly enlarged and the splenic parenchyma is not well delineated. Given the perisplenic fluid seen previously and increase in free abdominal fluid with hematocrit, these changes are felt to be related to splenic hemorrhage and likely rupture into the peritoneal cavity. Changes of underlying anemia with decreased density of the cardiac blood pool. Critical Value/emergent results were called by telephone at the time of interpretation on 10/30/2022 at 8:28 pm to Dr. Sherwood Gambler , who verbally acknowledged these results. Electronically Signed   By: Inez Catalina M.D.   On: 10/30/2022 20:37   DG Chest Portable 1 View  Result Date: 10/30/2022 CLINICAL DATA:  Syncope.  Nausea.  Vomiting. EXAM: PORTABLE CHEST 1 VIEW COMPARISON:  CXR 10/19/22 FINDINGS: No pleural effusion. No pneumothorax. No focal airspace opacity. Normal cardiac and mediastinal contours. No radiographically apparent displaced rib fractures. Visualized upper abdomen is unremarkable. IMPRESSION: No acute cardiopulmonary disease. Electronically Signed   By: Marin Roberts M.D.   On: 10/30/2022 18:22    Medications / Allergies: per chart  Antibiotics: Anti-infectives (From admission, onward)    None         Note: Portions of this report may have been transcribed using voice recognition software. Every effort was made to ensure accuracy; however, inadvertent computerized transcription errors may be  present.   Any transcriptional errors that result from this process are unintentional.    Adin Hector, MD, FACS, MASCRS Esophageal, Gastrointestinal & Colorectal Surgery Robotic and Minimally Invasive Surgery  [facility] [place]. [address], [place], [place] [address] [phone] Fax [phone] Main  CONTACT INFORMATION:  Weekday (9AM-5PM): Call [facility] main office at [phone]  Weeknight (5PM-9AM) or Weekend/Holiday: Check [email or web address] (password " [email or web address]") for General Surgery [facility] coverage  (Please, do not use SecureChat as it is not reliable communication to reach operating surgeons for immediate patient care given surgeries/outpatient duties/clinic/cross-coverage/off post-call which would lead to a delay in care.  [facility] staff  messaging available for outptient concerns, but may not be answered for 48 hours or more).     10/31/2022  8:39 AM

## 2022-10-31 NOTE — H&P (Signed)
History and Physical    Jared Duran [id number] DOB: [birth year] DOA: 10/30/2022  Patient coming from: Home.  Chief Complaint: Left upper quadrant pain and syncope.  HPI: Jared Duran is a 72 y.o. 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  Patient states he has been having persistent left upper quadrant pain last couple of days and today when he went to the bathroom after urinating he felt lightheaded and passed out.  He got up from the lying down position again he lost consciousness.  He has been having worsening left upper quadrant pain and had 2 episodes of hematemesis.  ED Course: In the ED patient's hemoglobin is around 6.1 which has dropped from 7.5 about a week ago.  Creatinine has worsened from normal last week and it is around 2.2.  CT abdomen pelvis done was followed by CT angiogram of the abdomen pelvis which shows features concerning for splenic hemorrhage with no active extravasation.  General surgery Dr. Johney Maine, gastroenterologist Dr. Watt Climes, interventional radiology Dr. Kathlene Cote has been consulted.  Patient transfusing 1 unit of PRBC for the anemia but admitted for further workup.  Review of Systems: As per HPI, rest all negative.   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 ([place]) [id number]   History of Helicobacter pylori infection 2021 10/30/2022   Hypertension    IDA (iron deficiency anemia) 10/30/2022   Pancreatitis     Past Surgical History:  Procedure Laterality Date   ESOPHAGOGASTRODUODENOSCOPY (EGD) WITH PROPOFOL N/A 04/10/2021   Procedure: ESOPHAGOGASTRODUODENOSCOPY (EGD) WITH PROPOFOL;  Surgeon: [name], MD;  Location: [facility];  Service: Endoscopy;  Laterality: N/A;   EUS N/A  04/10/2021   Procedure: UPPER ENDOSCOPIC ULTRASOUND (EUS) LINEAR;  Surgeon: [name], MD;  Location: [facility];  Service: Endoscopy;  Laterality: N/A;     reports that he has been smoking cigarettes. He has been smoking an average of 1 pack per day. He has never used smokeless tobacco. He reports that he does not currently use alcohol. He reports that he does not use drugs.  Allergies  Allergen Reactions   Ethanol Other (See Comments)    Severe pancreatitis & liver disease = NO ALCOHOL    History reviewed. No pertinent family history.  Prior to Admission medications   Medication Sig Start Date End Date Taking? Authorizing Provider  NIFEdipine (ADALAT CC) 90 MG 24 hr tablet Take 90 mg by mouth daily. 10/27/22  Yes [provider]  hydrOXYzine (ATARAX) 25 MG tablet Take 25 mg by mouth 3 (three) times daily as needed for anxiety or itching. 10/10/22   [provider]  losartan (COZAAR) 100 MG tablet Take 100 mg by mouth daily. 07/25/22   [provider]  oxyCODONE (ROXICODONE) 5 MG immediate release tablet Take 1 tablet (5 mg total) by mouth every 6 (six) hours as needed. 10/23/22 10/23/23  Dwyane Dee, MD  pantoprazole (PROTONIX) 20 MG tablet Take 20 mg by mouth daily. 08/11/22   [provider]  sertraline (ZOLOFT) 25 MG tablet Take 25 mg by mouth at bedtime. 10/10/22   [provider]    Physical Exam: Constitutional: Moderately built and nourished. Vitals:   10/31/22 0100 10/31/22 0115 10/31/22 0130 10/31/22 0228  BP: 120/78 122/80 126/83 118/80  Pulse:    83  Resp: '19 19 18 17  '$ Temp:  98.8 F (37.1 C)  98.7 F (37.1 C)  TempSrc:  Oral  Oral  SpO2:  100%  100%  Weight:      Height:       Eyes: Anicteric no pallor. ENMT: No discharge from the ears eyes nose and mouth. Neck: No mass felt.  No neck rigidity. Respiratory: No rhonchi or crepitations. Cardiovascular: S1-S2 heard. Abdomen: Mild tenderness diffusely.  No guarding or  rigidity. Musculoskeletal: No edema. Skin: No rash. Neurologic: Alert awake oriented to time place and person.  Moves all extremities. Psychiatric: Appears normal.  Normal affect.   Labs on Admission: I have personally reviewed following labs and imaging studies  CBC: Recent Labs  Lab 10/30/22 1819  WBC 19.4*  NEUTROABS 15.8*  HGB 6.1*  HCT 18.2*  MCV 100.6*  PLT [id number]   Basic Metabolic Panel: Recent Labs  Lab 10/30/22 1819  NA 130*  K 4.6  CL 103  CO2 18*  GLUCOSE 142*  BUN 29*  CREATININE 2.27*  CALCIUM 7.5*   GFR: Estimated Creatinine Clearance: 26.6 mL/min (A) (by C-G formula based on SCr of 2.27 mg/dL (H)). Liver Function Tests: Recent Labs  Lab 10/30/22 1819  AST 55*  ALT 59*  ALKPHOS 164*  BILITOT 4.1*  PROT 6.5  ALBUMIN 2.4*   Recent Labs  Lab 10/30/22 1819  LIPASE 276*   No results for input(s): "AMMONIA" in the last 168 hours. Coagulation Profile: Recent Labs  Lab 10/30/22 2311  INR 1.2   Cardiac Enzymes: No results for input(s): "CKTOTAL", "CKMB", "CKMBINDEX", "TROPONINI" in the last 168 hours. BNP (last 3 results) No results for input(s): "PROBNP" in the last 8760 hours. HbA1C: No results for input(s): "HGBA1C" in the last 72 hours. CBG: No results for input(s): "GLUCAP" in the last 168 hours. Lipid Profile: No results for input(s): "CHOL", "HDL", "LDLCALC", "TRIG", "CHOLHDL", "LDLDIRECT" in the last 72 hours. Thyroid Function Tests: No results for input(s): "TSH", "T4TOTAL", "FREET4", "T3FREE", "THYROIDAB" in the last 72 hours. Anemia Panel: Recent Labs    10/30/22 2031  VITAMINB12 451  FOLATE 8.6  FERRITIN 618*  TIBC 290  IRON 44*  RETICCTPCT 6.1*   Urine analysis:    Component Value Date/Time   COLORURINE AMBER (A) 10/19/2022 2147   APPEARANCEUR CLEAR 10/19/2022 2147   LABSPEC <=1.005 10/19/2022 2147   PHURINE 6.0 10/19/2022 2147   GLUCOSEU 100 (A) 10/19/2022 2147   HGBUR NEGATIVE 10/19/2022 2147   BILIRUBINUR  LARGE (A) 10/19/2022 2147   [place] NEGATIVE 10/19/2022 2147   PROTEINUR 100 (A) 10/19/2022 2147   NITRITE NEGATIVE 10/19/2022 2147   LEUKOCYTESUR NEGATIVE 10/19/2022 2147   Sepsis Labs: '@LABRCNTIP'$ (procalcitonin:4,lacticidven:4) )No results found for this or any previous visit (from the past 240 hour(s)).   Radiological Exams on Admission: CT Angio Abd/Pel w/ and/or w/o  Result Date: 10/30/2022 CLINICAL DATA:  Splenomegaly, history of pancreatitis, abdominal pain, evidence of hemorrhage on previous CT EXAM: CTA ABDOMEN AND PELVIS WITHOUT AND WITH CONTRAST TECHNIQUE: Multidetector CT imaging of the abdomen and pelvis was performed using the standard protocol during bolus administration of intravenous contrast. Multiplanar reconstructed images and MIPs were obtained and reviewed to evaluate the vascular anatomy. RADIATION DOSE REDUCTION: This exam was performed according to the departmental dose-optimization program which includes automated exposure control, adjustment of the mA and/or kV according to patient size and/or use of iterative reconstruction technique. CONTRAST:  83m OMNIPAQUE IOHEXOL 350 MG/ML SOLN COMPARISON:  10/30/2022, 10/20/2022, 10/19/2022 FINDINGS:  VASCULAR Aorta: Normal caliber aorta without aneurysm, dissection, vasculitis or significant stenosis. Atherosclerosis. Celiac: Patent without evidence of aneurysm, dissection, vasculitis or significant stenosis. SMA: Patent without evidence of aneurysm, dissection, vasculitis or significant stenosis. Renals: Both renal arteries are patent without evidence of aneurysm, dissection, vasculitis, fibromuscular dysplasia or significant stenosis. IMA: Patent without evidence of aneurysm, dissection, vasculitis or significant stenosis. Inflow: Patent without evidence of aneurysm, dissection, vasculitis or significant stenosis. Proximal Outflow: Bilateral common femoral and visualized portions of the superficial and profunda femoral arteries are  patent without evidence of aneurysm, dissection, vasculitis or significant stenosis. Veins: There is mild extrinsic compression upon the main portal vein due to the inflammatory changes of the pancreas. 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 Remaining venous structures are unremarkable. Review of the MIP images confirms the above findings. NON-VASCULAR Lower chest: Trace left pleural effusion. No acute airspace disease. Hepatobiliary: Stable intrahepatic and extrahepatic biliary duct dilation, with common bile duct measuring up to 11 mm. There is narrowing of the downstream common bile duct in the region of the pancreatic head. Minimal gallbladder sludge without evidence of cholecystitis. Stable benign hepatic cysts. Pancreas: Stable parenchymal calcifications within the head and body of the pancreas. There is decreased parenchymal enhancement within the region of the pancreatic head, consistent with edema and pancreatitis. Marked peripancreatic inflammatory change. There is a complex cystic structure adjacent to the tail the pancreas, measuring 4.1 x 2.6 cm, compatible with the hemorrhagic pseudocyst seen on previous imaging. No evidence of contrast extravasation to suggest active hemorrhage. Spleen: The spleen remains enlarged and heterogeneous, measuring approximately 10.0 x 7.7 by 10.5 cm in size. The diffuse heterogeneity of the splenic parenchyma seen previously is again noted, consistent with intracapsular hemorrhage. There is no evidence of contrast extravasation to suggest active bleeding. While no distinct capsular defect is identified, significant perisplenic fat stranding in the left upper quadrant as well as the high attenuation fluid throughout the abdomen and pelvis is concerning for splenic rupture. Adrenals/Urinary Tract: Stable right renal atrophy. No urinary tract calculi or  obstruction. Bladder is unremarkable. The adrenals are normal. Stomach/Bowel: No bowel obstruction or ileus. No bowel wall thickening or inflammatory change. Lymphatic: No pathologic adenopathy within the abdomen or pelvis. Reproductive: Stable appearance of the prostate. Other: High attenuation ascites throughout the abdomen and pelvis is unchanged, consistent with hemoperitoneum. No free intraperitoneal gas. No abdominal wall hernia. Musculoskeletal: No acute or destructive bony lesions. Reconstructed images demonstrate no additional findings. IMPRESSION: VASCULAR 1. No evidence of contrast extravasation to suggest active intra-abdominal or intrapelvic hemorrhage. 2. 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 3.  Aortic Atherosclerosis (ICD10-I70.0). NON-VASCULAR 1. Stable heterogeneous enlargement of the spleen, with perisplenic fat stranding and high attenuation fluid throughout the abdomen and pelvis consistent with intra splenic hemorrhage and likely capsular rupture. 2. Stable changes of pancreatitis, with heterogeneous fluid collection at the pancreatic tail consistent with hemorrhagic pseudocyst. 3. Stable high attenuation free fluid throughout the abdomen and pelvis compatible with hemoperitoneum. 4. Stable intrahepatic and extrahepatic biliary duct dilation, with narrowing of the downstream common bile duct at the level of the pancreatic head. Please see recent MRI evaluation. 5. Gallbladder sludge without cholelithiasis or cholecystitis. 6. Trace left pleural effusion. Electronically Signed   By: Diana Eves.D.  On: 10/30/2022 22:37   CT ABDOMEN PELVIS WO CONTRAST  Result Date: 10/30/2022 CLINICAL DATA:  Abdominal pain and syncope, history of pancreatitis, initial encounter EXAM: CT ABDOMEN AND PELVIS WITHOUT CONTRAST TECHNIQUE: Multidetector  CT imaging of the abdomen and pelvis was performed following the standard protocol without IV contrast. RADIATION DOSE REDUCTION: This exam was performed according to the departmental dose-optimization program which includes automated exposure control, adjustment of the mA and/or kV according to patient size and/or use of iterative reconstruction technique. COMPARISON:  10/19/2022 FINDINGS: Lower chest: Lung bases are free of acute infiltrate or sizable effusion. Cardiac blood pool is decreased in attenuation consistent with underlying anemia. Hepatobiliary: Gallbladder is within normal limits. Liver shows no focal abnormality. Very mild Peri hepatic fluid is noted. Pancreas: Diffuse calcifications are noted in the head and uncinate process and to a lesser degree within the body of the pancreas consistent with chronic pancreatitis. Superimposed inflammatory change in the pancreas is noted consistent with acute on chronic pancreatitis. Persistent cystic lesion is noted within the tail of the pancreas again measuring approximately 2.4 cm. This however demonstrates increased density when compared with the recent CT likely related to some underlying hemorrhage within. Spleen: The spleen is significantly enlarged when compare with the prior exam. The previously seen perisplenic fluid collection is not well visualized. These changes are likely related to interval splenic hemorrhage. These changes would correspond with the patient's given clinical history of increased pain. Adrenals/Urinary Tract: Adrenal glands are within normal limits. Kidneys demonstrate no renal calculi or obstructive changes. Simple cyst is noted in the upper pole of the right kidney. No further follow-up is recommended. The bladder is decompressed. Stomach/Bowel: The appendix is not well visualized. No inflammatory changes to suggest appendicitis are noted. Small bowel and stomach appear within normal limits. Vascular/Lymphatic: Aortic  atherosclerosis. No enlarged abdominal or pelvic lymph nodes. Reproductive: Pancreas appears within normal limits. Other: Considerable free fluid is noted which is of greater density than that expected for free fluid and with what appears to be a hematocrit level deep in the pelvis. These changes are consistent with intra-abdominal hemorrhage. Musculoskeletal: Degenerative changes of lumbar spine are noted. No acute bony abnormality is seen. IMPRESSION: Changes consistent with persistent acute pancreatitis. There has been interval hemorrhage within the cystic lesion within the tail the pancreas when compared with the prior CT examination. The spleen is now significantly enlarged and the splenic parenchyma is not well delineated. Given the perisplenic fluid seen previously and increase in free abdominal fluid with hematocrit, these changes are felt to be related to splenic hemorrhage and likely rupture into the peritoneal cavity. Changes of underlying anemia with decreased density of the cardiac blood pool. Critical Value/emergent results were called by telephone at the time of interpretation on 10/30/2022 at 8:28 pm to Dr. Sherwood Gambler , who verbally acknowledged these results. Electronically Signed   By: Inez Catalina M.D.   On: 10/30/2022 20:37   DG Chest Portable 1 View  Result Date: 10/30/2022 CLINICAL DATA:  Syncope.  Nausea.  Vomiting. EXAM: PORTABLE CHEST 1 VIEW COMPARISON:  CXR 10/19/22 FINDINGS: No pleural effusion. No pneumothorax. No focal airspace opacity. Normal cardiac and mediastinal contours. No radiographically apparent displaced rib fractures. Visualized upper abdomen is unremarkable. IMPRESSION: No acute cardiopulmonary disease. Electronically Signed   By: Marin Roberts M.D.   On: 10/30/2022 18:22    EKG: Independently reviewed.  Normal sinus rhythm.  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  Appreciate general surgery consult.  Transfusing 1 unit of PRBC follow CBC.  NPO.  IR and gastroenterology has also been consulted. Acute blood loss anemia follow CBC after transfusion.  Monitor in stepdown.  Since patient had hematemesis.  Keep patient on Protonix.  Eventually will need EGD. Acute on chronic pancreatitis will keep patient on pain relief medications. Syncope likely from hypovolemia.  Closely monitor in telemetry. Acute renal failure could be from hypovolemia and dehydration.  Will transfuse and follow metabolic panel.  CT scan does not show any obstruction of the kidney or ureters.  Check urinalysis. Prior history of alcohol abuse in remission. Hypertension will keep patient n.p.o. and IV hydralazine while NPO. Leukocytosis likely reactionary.  Since patient has possible splenic hemorrhage with acute blood loss anemia will need close monitoring more than 2 midnight stay and inpatient status.   DVT prophylaxis: SCDs. Code Status: Full code. Family Communication: Discussed with patient. Disposition Plan: Admit to stepdown unit. Consults called: General surgery, interventional radiology.  Gastroenterologist. Admission status: Inpatient.

## 2022-10-31 NOTE — Procedures (Signed)
Interventional Radiology Procedure Note  Procedure: Splenic arteriography and embolization  Complications: None  Estimated Blood Loss: < 10 mL  Findings: Splenic arteriography without overt extravasation or psuedoaneurysm but irregular splenic artery branches.  Embolization performed of main splenic artery with Medtronic MVP 5 and MVP 7 vascular plugs. Good diminishment in flow after embolization.  Venetia Night. Kathlene Cote, M.D Pager:  [phone]

## 2022-10-31 NOTE — ED Notes (Signed)
Pt to IR

## 2022-10-31 NOTE — ED Notes (Signed)
IR to get patient in about 30 mins. Reported will start second unit of PRBC when patient returns.

## 2022-11-01 ENCOUNTER — Inpatient Hospital Stay (HOSPITAL_COMMUNITY): Payer: Medicare PPO

## 2022-11-01 DIAGNOSIS — D735 Infarction of spleen: Secondary | ICD-10-CM | POA: Diagnosis not present

## 2022-11-01 LAB — CBC
HCT: 25.1 % — ABNORMAL LOW (ref 39.0–52.0)
HCT: 24.2 % — ABNORMAL LOW (ref 39.0–52.0)
Hemoglobin: 8.4 g/dL — ABNORMAL LOW (ref 13.0–17.0)
Hemoglobin: 8 g/dL — ABNORMAL LOW (ref 13.0–17.0)
MCH: 31.3 pg (ref 26.0–34.0)
MCH: 31.5 pg (ref 26.0–34.0)
MCHC: 33.5 g/dL (ref 30.0–36.0)
MCHC: 33.1 g/dL (ref 30.0–36.0)
MCV: 93.7 fL (ref 80.0–100.0)
MCV: 95.3 fL (ref 80.0–100.0)
Platelets: 217 10*3/uL (ref 150–400)
Platelets: 213 10*3/uL (ref 150–400)
RBC: 2.68 MIL/uL — ABNORMAL LOW (ref 4.22–5.81)
RBC: 2.54 MIL/uL — ABNORMAL LOW (ref 4.22–5.81)
RDW: 19.4 % — ABNORMAL HIGH (ref 11.5–15.5)
RDW: 18.6 % — ABNORMAL HIGH (ref 11.5–15.5)
WBC: 15.9 10*3/uL — ABNORMAL HIGH (ref 4.0–10.5)
WBC: 15.2 10*3/uL — ABNORMAL HIGH (ref 4.0–10.5)
nRBC: 0.1 % (ref 0.0–0.2)
nRBC: 0 % (ref 0.0–0.2)

## 2022-11-01 LAB — COMPREHENSIVE METABOLIC PANEL
ALT: 27 U/L (ref 0–44)
AST: 18 U/L (ref 15–41)
Albumin: 2 g/dL — ABNORMAL LOW (ref 3.5–5.0)
Alkaline Phosphatase: 113 U/L (ref 38–126)
Anion gap: 10 (ref 5–15)
BUN: 23 mg/dL (ref 8–23)
CO2: 24 mmol/L (ref 22–32)
Calcium: 7.8 mg/dL — ABNORMAL LOW (ref 8.9–10.3)
Chloride: 101 mmol/L (ref 98–111)
Creatinine, Ser: 1.22 mg/dL (ref 0.61–1.24)
GFR, Estimated: >60 mL/min (ref >60)
Glucose, Bld: 103 mg/dL — ABNORMAL HIGH (ref 70–99)
Potassium: 3.8 mmol/L (ref 3.5–5.1)
Sodium: 135 mmol/L (ref 135–145)
Total Bilirubin: 3.1 mg/dL — ABNORMAL HIGH (ref 0.3–1.2)
Total Protein: 5.5 g/dL — ABNORMAL LOW (ref 6.5–8.1)

## 2022-11-01 LAB — GLUCOSE, CAPILLARY
Glucose-Capillary: 109 mg/dL — ABNORMAL HIGH (ref 70–99)
Glucose-Capillary: 112 mg/dL — ABNORMAL HIGH (ref 70–99)
Glucose-Capillary: 112 mg/dL — ABNORMAL HIGH (ref 70–99)
Glucose-Capillary: 85 mg/dL (ref 70–99)

## 2022-11-01 LAB — LIPASE, BLOOD: Lipase: 120 U/L — ABNORMAL HIGH (ref 11–51)

## 2022-11-01 MED ORDER — IOHEXOL 9 MG/ML PO SOLN
500.0000 mL | ORAL | Status: AC
  Administered 2022-11-01 (×2): 500 mL via ORAL

## 2022-11-01 MED ORDER — IOHEXOL 300 MG/ML  SOLN
100.0000 mL | Freq: Once | INTRAMUSCULAR | Status: AC | PRN
  Administered 2022-11-01: 100 mL via INTRAVENOUS

## 2022-11-01 MED ORDER — SODIUM CHLORIDE 0.9 % IV SOLN
2.0000 g | INTRAVENOUS | Status: DC
  Administered 2022-11-01 – 2022-11-05 (×5): 2 g via INTRAVENOUS
  Filled 2022-11-01 (×5): qty 20

## 2022-11-01 MED ORDER — CHLORHEXIDINE GLUCONATE CLOTH 2 % EX PADS
6.0000 | MEDICATED_PAD | Freq: Every day | CUTANEOUS | Status: DC
  Administered 2022-11-01 – 2022-11-02 (×2): 6 via TOPICAL

## 2022-11-01 NOTE — Plan of Care (Signed)

## 2022-11-01 NOTE — Progress Notes (Signed)
Stat pseudoaneurysm check attempted. Patient in CT. Will attempt again as schedule and patient availability permits.   Darlin Coco, RDMS, RVT

## 2022-11-01 NOTE — Progress Notes (Addendum)
CT reviewed with Dr. Laurence Ferrari.   Patient appears to have a small right groin hematoma, no pseudoaneurysm.   Duplex study to be canceled, spoke with vascular US team and nursing. Please call IR for questions and concerns.   Armando Gang London Tarnowski PA-C 11/01/2022 2:42 PM

## 2022-11-01 NOTE — Progress Notes (Signed)
Subjective/Chief Complaint: Denies abdominal pain this morning Hemodynamically stable overnight   Objective: Vital signs in last 24 hours: Temp:  [97.7 F (36.5 C)-98.8 F (37.1 C)] 98.2 F (36.8 C) (02/24 0300) Pulse Rate:  [72-100] 82 (02/24 0600) Resp:  [13-27] 18 (02/24 0600) BP: (112-166)/(75-98) 148/83 (02/24 0600) SpO2:  [90 %-100 %] 91 % (02/24 0600) Weight:  [55.5 kg] 55.5 kg (02/23 1300) Last BM Date : 10/30/22  Intake/Output from previous day: 02/23 0701 - 02/24 0700 In: 2885.9 [I.V.:2087.2; Blood:698.7; IV Piggyback:100] Out: 1050 [Urine:1050] Intake/Output this shift: No intake/output data recorded.  Exam: Awake and alert Follows commands Abdomen soft, non-tender this morning  Lab Results:  Recent Labs    10/31/22 1535 11/01/22 0616  WBC 16.8* 15.9*  HGB 8.9* 8.4*  HCT 26.2* 25.1*  PLT 248 217   BMET Recent Labs    10/31/22 1535 11/01/22 0616  NA 135 135  K 3.9 3.8  CL 108 101  CO2 22 24  GLUCOSE 113* 103*  BUN 29* 23  CREATININE 1.49* 1.22  CALCIUM 7.6* 7.8*   PT/INR Recent Labs    10/30/22 2311  LABPROT 15.2  INR 1.2   ABG No results for input(s): "PHART", "HCO3" in the last 72 hours.  Invalid input(s): "PCO2", "PO2"  Studies/Results: IR US Guide Vasc Access Right  Result Date: 10/31/2022 INDICATION: Pancreatitis and spontaneous splenic hemorrhage with parenchymal subcapsular hematoma. Hemoglobin has not responded appropriately to blood transfusion and the patient now presents for splenic artery embolization. EXAM: 1. ULTRASOUND GUIDANCE FOR VASCULAR ACCESS OF THE RIGHT COMMON FEMORAL ARTERY 2. VISCERAL SELECTIVE ARTERIOGRAPHY OF THE SPLENIC ARTERY 3. ADDITIONAL SELECTIVE ARTERIOGRAPHY OF THE DISTAL SPLENIC ARTERY 4. TRANSCATHETER EMBOLIZATION OF THE SPLENIC ARTERY TO TREAT ARTERIAL HEMORRHAGE MEDICATIONS: 2 g IV Ancef. The antibiotic was administered within 1 hour of the procedure ANESTHESIA/SEDATION: Moderate (conscious)  sedation was employed during this procedure. A total of Versed 2.0 mg and Fentanyl 100 mcg was administered intravenously. Moderate Sedation Time: 55 minutes. The patient's level of consciousness and vital signs were monitored continuously by radiology nursing throughout the procedure under my direct supervision. CONTRAST:  25 mL Visipaque 320 FLUOROSCOPY TIME:  Radiation Exposure Index (as provided by the fluoroscopic device): [id number] mGy Kerma COMPLICATIONS: None immediate. PROCEDURE: Informed consent was obtained from the patient following explanation of the procedure, risks, benefits and alternatives. The patient understands, agrees and consents for the procedure. All questions were addressed. A time out was performed prior to the initiation of the procedure. Maximal barrier sterile technique utilized including caps, mask, sterile gowns, sterile gloves, large sterile drape, hand hygiene, and chlorhexidine prep. During the procedure local anesthesia was provided with 1% lidocaine. Ultrasound was used to confirm patency of the right common femoral artery. A permanent ultrasound image was saved and recorded. The right common femoral artery was accessed utilizing a micropuncture set. A 5 French sheath was placed. A 5 French Cobra catheter was advanced into the abdominal aorta. The catheter was used to selectively catheterize the celiac axis. The catheter was then used to selectively catheterize the proximal splenic artery. Selective splenic arteriography was performed. A microcatheter was advanced through the 5 French catheter and further advanced into the distal aspect of the splenic artery. Additional selective splenic arteriography was performed. Embolization was performed through the microcatheter with initial deployment of a Medtronic MVP 5 vascular plug. Additional arteriography was performed through the microcatheter as well as periodic injection of contrast material under fluoroscopy to assess flow in the  splenic artery. The microcatheter was removed. The 5 French catheter was then further advanced into the splenic artery. Additional arteriography was performed. An MVP 7 vascular plug was then deployed through the 5 [place] catheter. Additional arteriography was then performed through the 5 French catheter. The catheter was then removed. Hemostasis was obtained at the sheath access site with the Angio-Seal device. FINDINGS: Initial splenic arteriography demonstrates a tortuous splenic artery supplying an enlarged spleen. Intra-splenic branches are beaded and tortuous especially in the lower pole of the spleen. There is some displacement of vessels in the central aspect of the splenic parenchyma likely related to intraparenchymal hemorrhage. No evidence of overt contrast extravasation or arterial pseudoaneurysm. Additional selective arteriography was performed near the splenic hilum in order to define bifurcation point of the main splenic artery. After placement of a single vascular plug, there was still flow noted around the plug and eventually the plug migrated in the artery slightly towards the splenic hilum. A second, larger vascular plug was therefore deployed in the distal splenic artery with significant reduction in flow and adequate reduce flow noted. IMPRESSION: Successful embolization of the splenic artery with placement of two separate vascular plugs. Electronically Signed   By: [name] M.D.   On: 10/31/2022 16:43   IR Angiogram Visceral Selective  Result Date: 10/31/2022 INDICATION: Pancreatitis and spontaneous splenic hemorrhage with parenchymal subcapsular hematoma. Hemoglobin has not responded appropriately to blood transfusion and the patient now presents for splenic artery embolization. EXAM: 1. ULTRASOUND GUIDANCE FOR VASCULAR ACCESS OF THE RIGHT COMMON FEMORAL ARTERY 2. VISCERAL SELECTIVE ARTERIOGRAPHY OF THE SPLENIC ARTERY 3. ADDITIONAL SELECTIVE ARTERIOGRAPHY OF THE DISTAL SPLENIC  ARTERY 4. TRANSCATHETER EMBOLIZATION OF THE SPLENIC ARTERY TO TREAT ARTERIAL HEMORRHAGE MEDICATIONS: 2 g IV Ancef. The antibiotic was administered within 1 hour of the procedure ANESTHESIA/SEDATION: Moderate (conscious) sedation was employed during this procedure. A total of Versed 2.0 mg and Fentanyl 100 mcg was administered intravenously. Moderate Sedation Time: 55 minutes. The patient's level of consciousness and vital signs were monitored continuously by radiology nursing throughout the procedure under my direct supervision. CONTRAST:  25 mL Visipaque 320 FLUOROSCOPY TIME:  Radiation Exposure Index (as provided by the fluoroscopic device): [id number] mGy Kerma COMPLICATIONS: None immediate. PROCEDURE: Informed consent was obtained from the patient following explanation of the procedure, risks, benefits and alternatives. The patient understands, agrees and consents for the procedure. All questions were addressed. A time out was performed prior to the initiation of the procedure. Maximal barrier sterile technique utilized including caps, mask, sterile gowns, sterile gloves, large sterile drape, hand hygiene, and chlorhexidine prep. During the procedure local anesthesia was provided with 1% lidocaine. Ultrasound was used to confirm patency of the right common femoral artery. A permanent ultrasound image was saved and recorded. The right common femoral artery was accessed utilizing a micropuncture set. A 5 French sheath was placed. A 5 French Cobra catheter was advanced into the abdominal aorta. The catheter was used to selectively catheterize the celiac axis. The catheter was then used to selectively catheterize the proximal splenic artery. Selective splenic arteriography was performed. A microcatheter was advanced through the 5 French catheter and further advanced into the distal aspect of the splenic artery. Additional selective splenic arteriography was performed. Embolization was performed through the microcatheter  with initial deployment of a Medtronic MVP 5 vascular plug. Additional arteriography was performed through the microcatheter as well as periodic injection of contrast material under fluoroscopy to assess flow in the splenic artery. The microcatheter was removed.  The 5 French catheter was then further advanced into the splenic artery. Additional arteriography was performed. An MVP 7 vascular plug was then deployed through the 5 [place] catheter. Additional arteriography was then performed through the 5 French catheter. The catheter was then removed. Hemostasis was obtained at the sheath access site with the Angio-Seal device. FINDINGS: Initial splenic arteriography demonstrates a tortuous splenic artery supplying an enlarged spleen. Intra-splenic branches are beaded and tortuous especially in the lower pole of the spleen. There is some displacement of vessels in the central aspect of the splenic parenchyma likely related to intraparenchymal hemorrhage. No evidence of overt contrast extravasation or arterial pseudoaneurysm. Additional selective arteriography was performed near the splenic hilum in order to define bifurcation point of the main splenic artery. After placement of a single vascular plug, there was still flow noted around the plug and eventually the plug migrated in the artery slightly towards the splenic hilum. A second, larger vascular plug was therefore deployed in the distal splenic artery with significant reduction in flow and adequate reduce flow noted. IMPRESSION: Successful embolization of the splenic artery with placement of two separate vascular plugs. Electronically Signed   By: [name] M.D.   On: 10/31/2022 16:43   IR Angiogram Selective Each Additional Vessel  Result Date: 10/31/2022 INDICATION: Pancreatitis and spontaneous splenic hemorrhage with parenchymal subcapsular hematoma. Hemoglobin has not responded appropriately to blood transfusion and the patient now presents for  splenic artery embolization. EXAM: 1. ULTRASOUND GUIDANCE FOR VASCULAR ACCESS OF THE RIGHT COMMON FEMORAL ARTERY 2. VISCERAL SELECTIVE ARTERIOGRAPHY OF THE SPLENIC ARTERY 3. ADDITIONAL SELECTIVE ARTERIOGRAPHY OF THE DISTAL SPLENIC ARTERY 4. TRANSCATHETER EMBOLIZATION OF THE SPLENIC ARTERY TO TREAT ARTERIAL HEMORRHAGE MEDICATIONS: 2 g IV Ancef. The antibiotic was administered within 1 hour of the procedure ANESTHESIA/SEDATION: Moderate (conscious) sedation was employed during this procedure. A total of Versed 2.0 mg and Fentanyl 100 mcg was administered intravenously. Moderate Sedation Time: 55 minutes. The patient's level of consciousness and vital signs were monitored continuously by radiology nursing throughout the procedure under my direct supervision. CONTRAST:  25 mL Visipaque 320 FLUOROSCOPY TIME:  Radiation Exposure Index (as provided by the fluoroscopic device): [id number] mGy Kerma COMPLICATIONS: None immediate. PROCEDURE: Informed consent was obtained from the patient following explanation of the procedure, risks, benefits and alternatives. The patient understands, agrees and consents for the procedure. All questions were addressed. A time out was performed prior to the initiation of the procedure. Maximal barrier sterile technique utilized including caps, mask, sterile gowns, sterile gloves, large sterile drape, hand hygiene, and chlorhexidine prep. During the procedure local anesthesia was provided with 1% lidocaine. Ultrasound was used to confirm patency of the right common femoral artery. A permanent ultrasound image was saved and recorded. The right common femoral artery was accessed utilizing a micropuncture set. A 5 French sheath was placed. A 5 French Cobra catheter was advanced into the abdominal aorta. The catheter was used to selectively catheterize the celiac axis. The catheter was then used to selectively catheterize the proximal splenic artery. Selective splenic arteriography was performed. A  microcatheter was advanced through the 5 French catheter and further advanced into the distal aspect of the splenic artery. Additional selective splenic arteriography was performed. Embolization was performed through the microcatheter with initial deployment of a Medtronic MVP 5 vascular plug. Additional arteriography was performed through the microcatheter as well as periodic injection of contrast material under fluoroscopy to assess flow in the splenic artery. The microcatheter was removed. The 5 [place] catheter  was then further advanced into the splenic artery. Additional arteriography was performed. An MVP 7 vascular plug was then deployed through the 5 [place] catheter. Additional arteriography was then performed through the 5 French catheter. The catheter was then removed. Hemostasis was obtained at the sheath access site with the Angio-Seal device. FINDINGS: Initial splenic arteriography demonstrates a tortuous splenic artery supplying an enlarged spleen. Intra-splenic branches are beaded and tortuous especially in the lower pole of the spleen. There is some displacement of vessels in the central aspect of the splenic parenchyma likely related to intraparenchymal hemorrhage. No evidence of overt contrast extravasation or arterial pseudoaneurysm. Additional selective arteriography was performed near the splenic hilum in order to define bifurcation point of the main splenic artery. After placement of a single vascular plug, there was still flow noted around the plug and eventually the plug migrated in the artery slightly towards the splenic hilum. A second, larger vascular plug was therefore deployed in the distal splenic artery with significant reduction in flow and adequate reduce flow noted. IMPRESSION: Successful embolization of the splenic artery with placement of two separate vascular plugs. Electronically Signed   By: [name] M.D.   On: 10/31/2022 16:43   IR EMBO ART  VEN HEMORR LYMPH EXTRAV   INC GUIDE ROADMAPPING  Result Date: 10/31/2022 INDICATION: Pancreatitis and spontaneous splenic hemorrhage with parenchymal subcapsular hematoma. Hemoglobin has not responded appropriately to blood transfusion and the patient now presents for splenic artery embolization. EXAM: 1. ULTRASOUND GUIDANCE FOR VASCULAR ACCESS OF THE RIGHT COMMON FEMORAL ARTERY 2. VISCERAL SELECTIVE ARTERIOGRAPHY OF THE SPLENIC ARTERY 3. ADDITIONAL SELECTIVE ARTERIOGRAPHY OF THE DISTAL SPLENIC ARTERY 4. TRANSCATHETER EMBOLIZATION OF THE SPLENIC ARTERY TO TREAT ARTERIAL HEMORRHAGE MEDICATIONS: 2 g IV Ancef. The antibiotic was administered within 1 hour of the procedure ANESTHESIA/SEDATION: Moderate (conscious) sedation was employed during this procedure. A total of Versed 2.0 mg and Fentanyl 100 mcg was administered intravenously. Moderate Sedation Time: 55 minutes. The patient's level of consciousness and vital signs were monitored continuously by radiology nursing throughout the procedure under my direct supervision. CONTRAST:  25 mL Visipaque 320 FLUOROSCOPY TIME:  Radiation Exposure Index (as provided by the fluoroscopic device): [id number] mGy Kerma COMPLICATIONS: None immediate. PROCEDURE: Informed consent was obtained from the patient following explanation of the procedure, risks, benefits and alternatives. The patient understands, agrees and consents for the procedure. All questions were addressed. A time out was performed prior to the initiation of the procedure. Maximal barrier sterile technique utilized including caps, mask, sterile gowns, sterile gloves, large sterile drape, hand hygiene, and chlorhexidine prep. During the procedure local anesthesia was provided with 1% lidocaine. Ultrasound was used to confirm patency of the right common femoral artery. A permanent ultrasound image was saved and recorded. The right common femoral artery was accessed utilizing a micropuncture set. A 5 French sheath was placed. A 5 French Cobra  catheter was advanced into the abdominal aorta. The catheter was used to selectively catheterize the celiac axis. The catheter was then used to selectively catheterize the proximal splenic artery. Selective splenic arteriography was performed. A microcatheter was advanced through the 5 French catheter and further advanced into the distal aspect of the splenic artery. Additional selective splenic arteriography was performed. Embolization was performed through the microcatheter with initial deployment of a Medtronic MVP 5 vascular plug. Additional arteriography was performed through the microcatheter as well as periodic injection of contrast material under fluoroscopy to assess flow in the splenic artery. The microcatheter was removed. The 5  French catheter was then further advanced into the splenic artery. Additional arteriography was performed. An MVP 7 vascular plug was then deployed through the 5 [place] catheter. Additional arteriography was then performed through the 5 French catheter. The catheter was then removed. Hemostasis was obtained at the sheath access site with the Angio-Seal device. FINDINGS: Initial splenic arteriography demonstrates a tortuous splenic artery supplying an enlarged spleen. Intra-splenic branches are beaded and tortuous especially in the lower pole of the spleen. There is some displacement of vessels in the central aspect of the splenic parenchyma likely related to intraparenchymal hemorrhage. No evidence of overt contrast extravasation or arterial pseudoaneurysm. Additional selective arteriography was performed near the splenic hilum in order to define bifurcation point of the main splenic artery. After placement of a single vascular plug, there was still flow noted around the plug and eventually the plug migrated in the artery slightly towards the splenic hilum. A second, larger vascular plug was therefore deployed in the distal splenic artery with significant reduction in flow and  adequate reduce flow noted. IMPRESSION: Successful embolization of the splenic artery with placement of two separate vascular plugs. Electronically Signed   By: [name] M.D.   On: 10/31/2022 16:43   CT Angio Abd/Pel w/ and/or w/o  Result Date: 10/30/2022 CLINICAL DATA:  Splenomegaly, history of pancreatitis, abdominal pain, evidence of hemorrhage on previous CT EXAM: CTA ABDOMEN AND PELVIS WITHOUT AND WITH CONTRAST TECHNIQUE: Multidetector CT imaging of the abdomen and pelvis was performed using the standard protocol during bolus administration of intravenous contrast. Multiplanar reconstructed images and MIPs were obtained and reviewed to evaluate the vascular anatomy. RADIATION DOSE REDUCTION: This exam was performed according to the departmental dose-optimization program which includes automated exposure control, adjustment of the mA and/or kV according to patient size and/or use of iterative reconstruction technique. CONTRAST:  56m OMNIPAQUE IOHEXOL 350 MG/ML SOLN COMPARISON:  10/30/2022, 10/20/2022, 10/19/2022 FINDINGS: VASCULAR Aorta: Normal caliber aorta without aneurysm, dissection, vasculitis or significant stenosis. Atherosclerosis. Celiac: Patent without evidence of aneurysm, dissection, vasculitis or significant stenosis. SMA: Patent without evidence of aneurysm, dissection, vasculitis or significant stenosis. Renals: Both renal arteries are patent without evidence of aneurysm, dissection, vasculitis, fibromuscular dysplasia or significant stenosis. IMA: Patent without evidence of aneurysm, dissection, vasculitis or significant stenosis. Inflow: Patent without evidence of aneurysm, dissection, vasculitis or significant stenosis. Proximal Outflow: Bilateral common femoral and visualized portions of the superficial and profunda femoral arteries are patent without evidence of aneurysm, dissection, vasculitis or significant stenosis. Veins: There is mild extrinsic compression upon the main  portal vein due to the inflammatory changes of the pancreas. 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 Remaining venous structures are unremarkable. Review of the MIP images confirms the above findings. NON-VASCULAR Lower chest: Trace left pleural effusion. No acute airspace disease. Hepatobiliary: Stable intrahepatic and extrahepatic biliary duct dilation, with common bile duct measuring up to 11 mm. There is narrowing of the downstream common bile duct in the region of the pancreatic head. Minimal gallbladder sludge without evidence of cholecystitis. Stable benign hepatic cysts. Pancreas: Stable parenchymal calcifications within the head and body of the pancreas. There is decreased parenchymal enhancement within the region of the pancreatic head, consistent with edema and pancreatitis. Marked peripancreatic inflammatory change. There is a complex cystic structure adjacent to the tail the pancreas, measuring 4.1 x 2.6 cm, compatible with the hemorrhagic pseudocyst seen on previous imaging. No evidence  of contrast extravasation to suggest active hemorrhage. Spleen: The spleen remains enlarged and heterogeneous, measuring approximately 10.0 x 7.7 by 10.5 cm in size. The diffuse heterogeneity of the splenic parenchyma seen previously is again noted, consistent with intracapsular hemorrhage. There is no evidence of contrast extravasation to suggest active bleeding. While no distinct capsular defect is identified, significant perisplenic fat stranding in the left upper quadrant as well as the high attenuation fluid throughout the abdomen and pelvis is concerning for splenic rupture. Adrenals/Urinary Tract: Stable right renal atrophy. No urinary tract calculi or obstruction. Bladder is unremarkable. The adrenals are normal. Stomach/Bowel: No bowel obstruction or ileus. No bowel wall thickening or inflammatory  change. Lymphatic: No pathologic adenopathy within the abdomen or pelvis. Reproductive: Stable appearance of the prostate. Other: High attenuation ascites throughout the abdomen and pelvis is unchanged, consistent with hemoperitoneum. No free intraperitoneal gas. No abdominal wall hernia. Musculoskeletal: No acute or destructive bony lesions. Reconstructed images demonstrate no additional findings. IMPRESSION: VASCULAR 1. No evidence of contrast extravasation to suggest active intra-abdominal or intrapelvic hemorrhage. 2. 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 3.  Aortic Atherosclerosis (ICD10-I70.0). NON-VASCULAR 1. Stable heterogeneous enlargement of the spleen, with perisplenic fat stranding and high attenuation fluid throughout the abdomen and pelvis consistent with intra splenic hemorrhage and likely capsular rupture. 2. Stable changes of pancreatitis, with heterogeneous fluid collection at the pancreatic tail consistent with hemorrhagic pseudocyst. 3. Stable high attenuation free fluid throughout the abdomen and pelvis compatible with hemoperitoneum. 4. Stable intrahepatic and extrahepatic biliary duct dilation, with narrowing of the downstream common bile duct at the level of the pancreatic head. Please see recent MRI evaluation. 5. Gallbladder sludge without cholelithiasis or cholecystitis. 6. Trace left pleural effusion. Electronically Signed   By: Randa Ngo M.D.   On: 10/30/2022 22:37   CT ABDOMEN PELVIS WO CONTRAST  Result Date: 10/30/2022 CLINICAL DATA:  Abdominal pain and syncope, history of pancreatitis, initial encounter EXAM: CT ABDOMEN AND PELVIS WITHOUT CONTRAST TECHNIQUE: Multidetector CT imaging of the abdomen and pelvis was performed following the standard protocol without IV contrast. RADIATION DOSE REDUCTION: This exam was  performed according to the departmental dose-optimization program which includes automated exposure control, adjustment of the mA and/or kV according to patient size and/or use of iterative reconstruction technique. COMPARISON:  10/19/2022 FINDINGS: Lower chest: Lung bases are free of acute infiltrate or sizable effusion. Cardiac blood pool is decreased in attenuation consistent with underlying anemia. Hepatobiliary: Gallbladder is within normal limits. Liver shows no focal abnormality. Very mild Peri hepatic fluid is noted. Pancreas: Diffuse calcifications are noted in the head and uncinate process and to a lesser degree within the body of the pancreas consistent with chronic pancreatitis. Superimposed inflammatory change in the pancreas is noted consistent with acute on chronic pancreatitis. Persistent cystic lesion is noted within the tail of the pancreas again measuring approximately 2.4 cm. This however demonstrates increased density when compared with the recent CT likely related to some underlying hemorrhage within. Spleen: The spleen is significantly enlarged when compare with the prior exam. The previously seen perisplenic fluid collection is not well visualized. These changes are likely related to interval splenic hemorrhage. These changes would correspond with the patient's given clinical history of increased pain. Adrenals/Urinary Tract: Adrenal glands are within normal limits. Kidneys demonstrate no renal calculi or obstructive changes. Simple cyst is noted in  the upper pole of the right kidney. No further follow-up is recommended. The bladder is decompressed. Stomach/Bowel: The appendix is not well visualized. No inflammatory changes to suggest appendicitis are noted. Small bowel and stomach appear within normal limits. Vascular/Lymphatic: Aortic atherosclerosis. No enlarged abdominal or pelvic lymph nodes. Reproductive: Pancreas appears within normal limits. Other: Considerable free fluid is noted  which is of greater density than that expected for free fluid and with what appears to be a hematocrit level deep in the pelvis. These changes are consistent with intra-abdominal hemorrhage. Musculoskeletal: Degenerative changes of lumbar spine are noted. No acute bony abnormality is seen. IMPRESSION: Changes consistent with persistent acute pancreatitis. There has been interval hemorrhage within the cystic lesion within the tail the pancreas when compared with the prior CT examination. The spleen is now significantly enlarged and the splenic parenchyma is not well delineated. Given the perisplenic fluid seen previously and increase in free abdominal fluid with hematocrit, these changes are felt to be related to splenic hemorrhage and likely rupture into the peritoneal cavity. Changes of underlying anemia with decreased density of the cardiac blood pool. Critical Value/emergent results were called by telephone at the time of interpretation on 10/30/2022 at 8:28 pm to Dr. Sherwood Gambler , who verbally acknowledged these results. Electronically Signed   By: Inez Catalina M.D.   On: 10/30/2022 20:37   DG Chest Portable 1 View  Result Date: 10/30/2022 CLINICAL DATA:  Syncope.  Nausea.  Vomiting. EXAM: PORTABLE CHEST 1 VIEW COMPARISON:  CXR 10/19/22 FINDINGS: No pleural effusion. No pneumothorax. No focal airspace opacity. Normal cardiac and mediastinal contours. No radiographically apparent displaced rib fractures. Visualized upper abdomen is unremarkable. IMPRESSION: No acute cardiopulmonary disease. Electronically Signed   By: Marin Roberts M.D.   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 Would continue bed rest for today. Ok to start clear liquids from a surgical standpoint Currently no need for urgent surgery  Moderately complex medical decision making  Coralie Keens MD 11/01/2022

## 2022-11-01 NOTE — Progress Notes (Addendum)
PROGRESS NOTE  Jared Duran  [id number] DOB: May 23, 1951 DOA: 10/30/2022 PCP: [name], MD   Brief Narrative: Patient is a 72 year old male with history of chronic pancreatitis related to alcohol, recently admitted for acute pancreatitis discharge last week presented back to the emergency department after syncopal episode. He was complaining of persistent left upper quadrant pain for couple of days, went to the bathroom, felt lightheaded and passed out.On presentation his blood pressure was soft, hemoglobin was 6.1 which dropped from 7.5 last week. Lab work showed creatinine of 2.2. CT imaging showed features concerning for splenic hemorrhage with no active exacerbation. General surgery, GI, IR consulted.  Transfused with 2 units of PRBC.  Underwent splenic arteriography and embolization.  Hospital course remarkable for persistent abdominal pain.  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  Underwent splenic arteriography and embolization.  Hospital course remarkable for persistent abdominal pain. General surgery is closely following for consideration of splenectomy. Abdomen is diffusely tender with guarding and rigidity, complains of abdominal pain.  Will do CT abdomen/pelvis with contrast.  Discussed with general surgery, Dr. Ninfa Linden this morning  Chronic pancreatitis: History of alcohol abuse.  Just discharged from here about a  week ago.  Elevated lipase with mild elevated liver enzymes.  Imaging showed  moderate to severe chronic pancreatitis.  Bilirubin elevated to 4.  Currently NPO.  Acute blood loss anemia:-Transfused with 2 units of PRBC, currently hemoglobin stable in the range of 8  AKI/hyponatremia: Most likely secondary to volume loss from bleeding, hypoperfusion/hypotension.  Resolved  Leukocytosis: Could be reactive.  Started on ceftriaxone 2 g empirically given history of splenic hemorrhage/immunocompromised status       DVT prophylaxis:SCDs Start: 10/31/22 0244     Code Status: Full Code  Family Communication: Called and discussed with the daughter on phone on 2/24  Patient status:Inpatient  Patient is from :Home  Anticipated discharge RC:[id number]  Estimated DC date:not sure   Consultants: General surgery, IR  Procedures: Splenic artery embolization  Antimicrobials:  Anti-infectives (From admission, onward)    Start     Dose/Rate Route Frequency Ordered Stop   11/01/22 0900  cefTRIAXone (ROCEPHIN) 2 g in sodium chloride 0.9 % 100 mL IVPB        2 g 200 mL/hr over 30 Minutes Intravenous Every 24 hours 11/01/22 0750     10/31/22 1100  ceFAZolin (ANCEF) IVPB 2g/100 mL premix        2 g 200 mL/hr over 30 Minutes Intravenous  Once 10/31/22 1059 10/31/22 1205       Subjective: Patient seen and examined at bedside today.  Hemodynamically stable.  Complains of abdominal discomfort, no nausea or vomiting.  Currently NPO.  Objective: Vitals:   11/01/22 0400 11/01/22 0500 11/01/22 0600 11/01/22 1058  BP: (!) 143/88  (!) 148/83   Pulse: 98 84 82   Resp: '19 18 18   '$ Temp:    98.7 F (37.1  C)  TempSrc:    Oral  SpO2: 92% 90% 91%   Weight:      Height:        Intake/Output Summary (Last 24 hours) at 11/01/2022 1113 Last data filed at 11/01/2022 1059 Gross per 24 hour  Intake 3219.43 ml  Output 1050 ml  Net 2169.43 ml   Filed Weights   10/30/22 1751 10/31/22 1300  Weight: 61.2 kg  55.5 kg    Examination:  General exam: Uncomfortable due to abdominal pain HEENT: PERRL Respiratory system:  no wheezes or crackles  Cardiovascular system: S1 & S2 heard, RRR.  Gastrointestinal system: Abdomen is distended, diffusely tender, guarding  Central nervous system: Alert and oriented Extremities: No edema, no clubbing ,no cyanosis Skin: No rashes, no ulcers,no icterus     Data Reviewed: I have personally reviewed following labs and imaging studies  CBC: Recent Labs  Lab 10/30/22 1819 10/31/22 0516 10/31/22 1535 11/01/22 0616  WBC 19.4* 18.5* 16.8* 15.9*  NEUTROABS 15.8*  --   --   --   HGB 6.1* 6.5* 8.9* 8.4*  HCT 18.2* 18.9* 26.2* 25.1*  MCV 100.6* 95.5 91.9 93.7  PLT 354 256 248 [id number]   Basic Metabolic Panel: Recent Labs  Lab 10/30/22 1819 10/31/22 1535 11/01/22 0616  NA 130* 135 135  K 4.6 3.9 3.8  CL 103 108 101  CO2 18* 22 24  GLUCOSE 142* 113* 103*  BUN 29* 29* 23  CREATININE 2.27* 1.49* 1.22  CALCIUM 7.5* 7.6* 7.8*     Recent Results (from the past 240 hour(s))  MRSA Next Gen by PCR, Nasal     Status: None   Collection Time: 10/31/22  1:00 PM   Specimen: Nasal Mucosa; Nasal Swab  Result Value Ref Range Status   MRSA by PCR Next Gen NOT DETECTED NOT DETECTED Final    Comment: (NOTE) The GeneXpert MRSA Assay (FDA approved for NASAL specimens only), is one component of a comprehensive MRSA colonization surveillance program. It is not intended to diagnose MRSA infection nor to guide or monitor treatment for MRSA infections. Test performance is not FDA approved in patients less than 63 years old. Performed at [facility], [place] [address], [place], [place] [address]      Radiology Studies: IR US Guide Vasc Access Right  Result Date: 10/31/2022 INDICATION: Pancreatitis and spontaneous splenic hemorrhage with parenchymal subcapsular hematoma. Hemoglobin has not responded appropriately to blood transfusion and the patient  now presents for splenic artery embolization. EXAM: 1. ULTRASOUND GUIDANCE FOR VASCULAR ACCESS OF THE RIGHT COMMON FEMORAL ARTERY 2. VISCERAL SELECTIVE ARTERIOGRAPHY OF THE SPLENIC ARTERY 3. ADDITIONAL SELECTIVE ARTERIOGRAPHY OF THE DISTAL SPLENIC ARTERY 4. TRANSCATHETER EMBOLIZATION OF THE SPLENIC ARTERY TO TREAT ARTERIAL HEMORRHAGE MEDICATIONS: 2 g IV Ancef. The antibiotic was administered within 1 hour of the procedure ANESTHESIA/SEDATION: Moderate (conscious) sedation was employed during this procedure. A total of Versed 2.0 mg and Fentanyl 100 mcg was administered intravenously. Moderate Sedation Time: 55 minutes. The patient's level of consciousness and vital signs were monitored continuously by radiology nursing throughout the procedure under my direct supervision. CONTRAST:  25 mL Visipaque 320 FLUOROSCOPY TIME:  Radiation Exposure Index (as provided by the fluoroscopic device): [id number] mGy Kerma COMPLICATIONS: None immediate. PROCEDURE: Informed consent was obtained from the patient following explanation of the procedure, risks, benefits and alternatives. The patient understands, agrees and consents for the procedure. All questions were addressed. A time out was performed prior to the initiation of the procedure. Maximal  barrier sterile technique utilized including caps, mask, sterile gowns, sterile gloves, large sterile drape, hand hygiene, and chlorhexidine prep. During the procedure local anesthesia was provided with 1% lidocaine. Ultrasound was used to confirm patency of the right common femoral artery. A permanent ultrasound image was saved and recorded. The right common femoral artery was accessed utilizing a micropuncture set. A 5 French sheath was placed. A 5 French Cobra catheter was advanced into the abdominal aorta. The catheter was used to selectively catheterize the celiac axis. The catheter was then used to selectively catheterize the proximal splenic artery. Selective splenic arteriography was  performed. A microcatheter was advanced through the 5 French catheter and further advanced into the distal aspect of the splenic artery. Additional selective splenic arteriography was performed. Embolization was performed through the microcatheter with initial deployment of a Medtronic MVP 5 vascular plug. Additional arteriography was performed through the microcatheter as well as periodic injection of contrast material under fluoroscopy to assess flow in the splenic artery. The microcatheter was removed. The 5 French catheter was then further advanced into the splenic artery. Additional arteriography was performed. An MVP 7 vascular plug was then deployed through the 5 [place] catheter. Additional arteriography was then performed through the 5 French catheter. The catheter was then removed. Hemostasis was obtained at the sheath access site with the Angio-Seal device. FINDINGS: Initial splenic arteriography demonstrates a tortuous splenic artery supplying an enlarged spleen. Intra-splenic branches are beaded and tortuous especially in the lower pole of the spleen. There is some displacement of vessels in the central aspect of the splenic parenchyma likely related to intraparenchymal hemorrhage. No evidence of overt contrast extravasation or arterial pseudoaneurysm. Additional selective arteriography was performed near the splenic hilum in order to define bifurcation point of the main splenic artery. After placement of a single vascular plug, there was still flow noted around the plug and eventually the plug migrated in the artery slightly towards the splenic hilum. A second, larger vascular plug was therefore deployed in the distal splenic artery with significant reduction in flow and adequate reduce flow noted. IMPRESSION: Successful embolization of the splenic artery with placement of two separate vascular plugs. Electronically Signed   By: [name] M.D.   On: 10/31/2022 16:43   IR Angiogram Visceral  Selective  Result Date: 10/31/2022 INDICATION: Pancreatitis and spontaneous splenic hemorrhage with parenchymal subcapsular hematoma. Hemoglobin has not responded appropriately to blood transfusion and the patient now presents for splenic artery embolization. EXAM: 1. ULTRASOUND GUIDANCE FOR VASCULAR ACCESS OF THE RIGHT COMMON FEMORAL ARTERY 2. VISCERAL SELECTIVE ARTERIOGRAPHY OF THE SPLENIC ARTERY 3. ADDITIONAL SELECTIVE ARTERIOGRAPHY OF THE DISTAL SPLENIC ARTERY 4. TRANSCATHETER EMBOLIZATION OF THE SPLENIC ARTERY TO TREAT ARTERIAL HEMORRHAGE MEDICATIONS: 2 g IV Ancef. The antibiotic was administered within 1 hour of the procedure ANESTHESIA/SEDATION: Moderate (conscious) sedation was employed during this procedure. A total of Versed 2.0 mg and Fentanyl 100 mcg was administered intravenously. Moderate Sedation Time: 55 minutes. The patient's level of consciousness and vital signs were monitored continuously by radiology nursing throughout the procedure under my direct supervision. CONTRAST:  25 mL Visipaque 320 FLUOROSCOPY TIME:  Radiation Exposure Index (as provided by the fluoroscopic device): [id number] mGy Kerma COMPLICATIONS: None immediate. PROCEDURE: Informed consent was obtained from the patient following explanation of the procedure, risks, benefits and alternatives. The patient understands, agrees and consents for the procedure. All questions were addressed. A time out was performed prior to the initiation of the procedure. Maximal barrier sterile technique utilized including caps,  mask, sterile gowns, sterile gloves, large sterile drape, hand hygiene, and chlorhexidine prep. During the procedure local anesthesia was provided with 1% lidocaine. Ultrasound was used to confirm patency of the right common femoral artery. A permanent ultrasound image was saved and recorded. The right common femoral artery was accessed utilizing a micropuncture set. A 5 French sheath was placed. A 5 French Cobra catheter was  advanced into the abdominal aorta. The catheter was used to selectively catheterize the celiac axis. The catheter was then used to selectively catheterize the proximal splenic artery. Selective splenic arteriography was performed. A microcatheter was advanced through the 5 French catheter and further advanced into the distal aspect of the splenic artery. Additional selective splenic arteriography was performed. Embolization was performed through the microcatheter with initial deployment of a Medtronic MVP 5 vascular plug. Additional arteriography was performed through the microcatheter as well as periodic injection of contrast material under fluoroscopy to assess flow in the splenic artery. The microcatheter was removed. The 5 French catheter was then further advanced into the splenic artery. Additional arteriography was performed. An MVP 7 vascular plug was then deployed through the 5 [place] catheter. Additional arteriography was then performed through the 5 French catheter. The catheter was then removed. Hemostasis was obtained at the sheath access site with the Angio-Seal device. FINDINGS: Initial splenic arteriography demonstrates a tortuous splenic artery supplying an enlarged spleen. Intra-splenic branches are beaded and tortuous especially in the lower pole of the spleen. There is some displacement of vessels in the central aspect of the splenic parenchyma likely related to intraparenchymal hemorrhage. No evidence of overt contrast extravasation or arterial pseudoaneurysm. Additional selective arteriography was performed near the splenic hilum in order to define bifurcation point of the main splenic artery. After placement of a single vascular plug, there was still flow noted around the plug and eventually the plug migrated in the artery slightly towards the splenic hilum. A second, larger vascular plug was therefore deployed in the distal splenic artery with significant reduction in flow and adequate reduce  flow noted. IMPRESSION: Successful embolization of the splenic artery with placement of two separate vascular plugs. Electronically Signed   By: [name] M.D.   On: 10/31/2022 16:43   IR Angiogram Selective Each Additional Vessel  Result Date: 10/31/2022 INDICATION: Pancreatitis and spontaneous splenic hemorrhage with parenchymal subcapsular hematoma. Hemoglobin has not responded appropriately to blood transfusion and the patient now presents for splenic artery embolization. EXAM: 1. ULTRASOUND GUIDANCE FOR VASCULAR ACCESS OF THE RIGHT COMMON FEMORAL ARTERY 2. VISCERAL SELECTIVE ARTERIOGRAPHY OF THE SPLENIC ARTERY 3. ADDITIONAL SELECTIVE ARTERIOGRAPHY OF THE DISTAL SPLENIC ARTERY 4. TRANSCATHETER EMBOLIZATION OF THE SPLENIC ARTERY TO TREAT ARTERIAL HEMORRHAGE MEDICATIONS: 2 g IV Ancef. The antibiotic was administered within 1 hour of the procedure ANESTHESIA/SEDATION: Moderate (conscious) sedation was employed during this procedure. A total of Versed 2.0 mg and Fentanyl 100 mcg was administered intravenously. Moderate Sedation Time: 55 minutes. The patient's level of consciousness and vital signs were monitored continuously by radiology nursing throughout the procedure under my direct supervision. CONTRAST:  25 mL Visipaque 320 FLUOROSCOPY TIME:  Radiation Exposure Index (as provided by the fluoroscopic device): [id number] mGy Kerma COMPLICATIONS: None immediate. PROCEDURE: Informed consent was obtained from the patient following explanation of the procedure, risks, benefits and alternatives. The patient understands, agrees and consents for the procedure. All questions were addressed. A time out was performed prior to the initiation of the procedure. Maximal barrier sterile technique utilized including caps, mask, sterile gowns, sterile  gloves, large sterile drape, hand hygiene, and chlorhexidine prep. During the procedure local anesthesia was provided with 1% lidocaine. Ultrasound was used to confirm patency of  the right common femoral artery. A permanent ultrasound image was saved and recorded. The right common femoral artery was accessed utilizing a micropuncture set. A 5 French sheath was placed. A 5 French Cobra catheter was advanced into the abdominal aorta. The catheter was used to selectively catheterize the celiac axis. The catheter was then used to selectively catheterize the proximal splenic artery. Selective splenic arteriography was performed. A microcatheter was advanced through the 5 French catheter and further advanced into the distal aspect of the splenic artery. Additional selective splenic arteriography was performed. Embolization was performed through the microcatheter with initial deployment of a Medtronic MVP 5 vascular plug. Additional arteriography was performed through the microcatheter as well as periodic injection of contrast material under fluoroscopy to assess flow in the splenic artery. The microcatheter was removed. The 5 French catheter was then further advanced into the splenic artery. Additional arteriography was performed. An MVP 7 vascular plug was then deployed through the 5 [place] catheter. Additional arteriography was then performed through the 5 French catheter. The catheter was then removed. Hemostasis was obtained at the sheath access site with the Angio-Seal device. FINDINGS: Initial splenic arteriography demonstrates a tortuous splenic artery supplying an enlarged spleen. Intra-splenic branches are beaded and tortuous especially in the lower pole of the spleen. There is some displacement of vessels in the central aspect of the splenic parenchyma likely related to intraparenchymal hemorrhage. No evidence of overt contrast extravasation or arterial pseudoaneurysm. Additional selective arteriography was performed near the splenic hilum in order to define bifurcation point of the main splenic artery. After placement of a single vascular plug, there was still flow noted around the plug  and eventually the plug migrated in the artery slightly towards the splenic hilum. A second, larger vascular plug was therefore deployed in the distal splenic artery with significant reduction in flow and adequate reduce flow noted. IMPRESSION: Successful embolization of the splenic artery with placement of two separate vascular plugs. Electronically Signed   By: [name] M.D.   On: 10/31/2022 16:43   IR EMBO ART  VEN HEMORR LYMPH EXTRAV  INC GUIDE ROADMAPPING  Result Date: 10/31/2022 INDICATION: Pancreatitis and spontaneous splenic hemorrhage with parenchymal subcapsular hematoma. Hemoglobin has not responded appropriately to blood transfusion and the patient now presents for splenic artery embolization. EXAM: 1. ULTRASOUND GUIDANCE FOR VASCULAR ACCESS OF THE RIGHT COMMON FEMORAL ARTERY 2. VISCERAL SELECTIVE ARTERIOGRAPHY OF THE SPLENIC ARTERY 3. ADDITIONAL SELECTIVE ARTERIOGRAPHY OF THE DISTAL SPLENIC ARTERY 4. TRANSCATHETER EMBOLIZATION OF THE SPLENIC ARTERY TO TREAT ARTERIAL HEMORRHAGE MEDICATIONS: 2 g IV Ancef. The antibiotic was administered within 1 hour of the procedure ANESTHESIA/SEDATION: Moderate (conscious) sedation was employed during this procedure. A total of Versed 2.0 mg and Fentanyl 100 mcg was administered intravenously. Moderate Sedation Time: 55 minutes. The patient's level of consciousness and vital signs were monitored continuously by radiology nursing throughout the procedure under my direct supervision. CONTRAST:  25 mL Visipaque 320 FLUOROSCOPY TIME:  Radiation Exposure Index (as provided by the fluoroscopic device): [id number] mGy Kerma COMPLICATIONS: None immediate. PROCEDURE: Informed consent was obtained from the patient following explanation of the procedure, risks, benefits and alternatives. The patient understands, agrees and consents for the procedure. All questions were addressed. A time out was performed prior to the initiation of the procedure. Maximal barrier sterile  technique utilized including caps, mask,  sterile gowns, sterile gloves, large sterile drape, hand hygiene, and chlorhexidine prep. During the procedure local anesthesia was provided with 1% lidocaine. Ultrasound was used to confirm patency of the right common femoral artery. A permanent ultrasound image was saved and recorded. The right common femoral artery was accessed utilizing a micropuncture set. A 5 French sheath was placed. A 5 French Cobra catheter was advanced into the abdominal aorta. The catheter was used to selectively catheterize the celiac axis. The catheter was then used to selectively catheterize the proximal splenic artery. Selective splenic arteriography was performed. A microcatheter was advanced through the 5 French catheter and further advanced into the distal aspect of the splenic artery. Additional selective splenic arteriography was performed. Embolization was performed through the microcatheter with initial deployment of a Medtronic MVP 5 vascular plug. Additional arteriography was performed through the microcatheter as well as periodic injection of contrast material under fluoroscopy to assess flow in the splenic artery. The microcatheter was removed. The 5 French catheter was then further advanced into the splenic artery. Additional arteriography was performed. An MVP 7 vascular plug was then deployed through the 5 [place] catheter. Additional arteriography was then performed through the 5 French catheter. The catheter was then removed. Hemostasis was obtained at the sheath access site with the Angio-Seal device. FINDINGS: Initial splenic arteriography demonstrates a tortuous splenic artery supplying an enlarged spleen. Intra-splenic branches are beaded and tortuous especially in the lower pole of the spleen. There is some displacement of vessels in the central aspect of the splenic parenchyma likely related to intraparenchymal hemorrhage. No evidence of overt contrast extravasation or  arterial pseudoaneurysm. Additional selective arteriography was performed near the splenic hilum in order to define bifurcation point of the main splenic artery. After placement of a single vascular plug, there was still flow noted around the plug and eventually the plug migrated in the artery slightly towards the splenic hilum. A second, larger vascular plug was therefore deployed in the distal splenic artery with significant reduction in flow and adequate reduce flow noted. IMPRESSION: Successful embolization of the splenic artery with placement of two separate vascular plugs. Electronically Signed   By: [name] M.D.   On: 10/31/2022 16:43   CT Angio Abd/Pel w/ and/or w/o  Result Date: 10/30/2022 CLINICAL DATA:  Splenomegaly, history of pancreatitis, abdominal pain, evidence of hemorrhage on previous CT EXAM: CTA ABDOMEN AND PELVIS WITHOUT AND WITH CONTRAST TECHNIQUE: Multidetector CT imaging of the abdomen and pelvis was performed using the standard protocol during bolus administration of intravenous contrast. Multiplanar reconstructed images and MIPs were obtained and reviewed to evaluate the vascular anatomy. RADIATION DOSE REDUCTION: This exam was performed according to the departmental dose-optimization program which includes automated exposure control, adjustment of the mA and/or kV according to patient size and/or use of iterative reconstruction technique. CONTRAST:  79m OMNIPAQUE IOHEXOL 350 MG/ML SOLN COMPARISON:  10/30/2022, 10/20/2022, 10/19/2022 FINDINGS: VASCULAR Aorta: Normal caliber aorta without aneurysm, dissection, vasculitis or significant stenosis. Atherosclerosis. Celiac: Patent without evidence of aneurysm, dissection, vasculitis or significant stenosis. SMA: Patent without evidence of aneurysm, dissection, vasculitis or significant stenosis. Renals: Both renal arteries are patent without evidence of aneurysm, dissection, vasculitis, fibromuscular dysplasia or significant  stenosis. IMA: Patent without evidence of aneurysm, dissection, vasculitis or significant stenosis. Inflow: Patent without evidence of aneurysm, dissection, vasculitis or significant stenosis. Proximal Outflow: Bilateral common femoral and visualized portions of the superficial and profunda femoral arteries are patent without evidence of aneurysm, dissection, vasculitis or significant stenosis. Veins: There  is mild extrinsic compression upon the main portal vein due to the inflammatory changes of the pancreas. 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 Remaining venous structures are unremarkable. Review of the MIP images confirms the above findings. NON-VASCULAR Lower chest: Trace left pleural effusion. No acute airspace disease. Hepatobiliary: Stable intrahepatic and extrahepatic biliary duct dilation, with common bile duct measuring up to 11 mm. There is narrowing of the downstream common bile duct in the region of the pancreatic head. Minimal gallbladder sludge without evidence of cholecystitis. Stable benign hepatic cysts. Pancreas: Stable parenchymal calcifications within the head and body of the pancreas. There is decreased parenchymal enhancement within the region of the pancreatic head, consistent with edema and pancreatitis. Marked peripancreatic inflammatory change. There is a complex cystic structure adjacent to the tail the pancreas, measuring 4.1 x 2.6 cm, compatible with the hemorrhagic pseudocyst seen on previous imaging. No evidence of contrast extravasation to suggest active hemorrhage. Spleen: The spleen remains enlarged and heterogeneous, measuring approximately 10.0 x 7.7 by 10.5 cm in size. The diffuse heterogeneity of the splenic parenchyma seen previously is again noted, consistent with intracapsular hemorrhage. There is no evidence of contrast extravasation to suggest active  bleeding. While no distinct capsular defect is identified, significant perisplenic fat stranding in the left upper quadrant as well as the high attenuation fluid throughout the abdomen and pelvis is concerning for splenic rupture. Adrenals/Urinary Tract: Stable right renal atrophy. No urinary tract calculi or obstruction. Bladder is unremarkable. The adrenals are normal. Stomach/Bowel: No bowel obstruction or ileus. No bowel wall thickening or inflammatory change. Lymphatic: No pathologic adenopathy within the abdomen or pelvis. Reproductive: Stable appearance of the prostate. Other: High attenuation ascites throughout the abdomen and pelvis is unchanged, consistent with hemoperitoneum. No free intraperitoneal gas. No abdominal wall hernia. Musculoskeletal: No acute or destructive bony lesions. Reconstructed images demonstrate no additional findings. IMPRESSION: VASCULAR 1. No evidence of contrast extravasation to suggest active intra-abdominal or intrapelvic hemorrhage. 2. 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 3.  Aortic Atherosclerosis (ICD10-I70.0). NON-VASCULAR 1. Stable heterogeneous enlargement of the spleen, with perisplenic fat stranding and high attenuation fluid throughout the abdomen and pelvis consistent with intra splenic hemorrhage and likely capsular rupture. 2. Stable changes of pancreatitis, with heterogeneous fluid collection at the pancreatic tail consistent with hemorrhagic pseudocyst. 3. Stable high attenuation free fluid throughout the abdomen and pelvis compatible with hemoperitoneum. 4. Stable intrahepatic and extrahepatic biliary duct dilation, with narrowing of the downstream common bile duct at the level of the pancreatic head. Please see recent MRI evaluation. 5. Gallbladder sludge without cholelithiasis or cholecystitis. 6. Trace  left pleural effusion. Electronically Signed   By: Randa Ngo M.D.   On: 10/30/2022 22:37   CT ABDOMEN PELVIS WO CONTRAST  Result Date: 10/30/2022 CLINICAL DATA:  Abdominal pain and syncope, history of pancreatitis, initial encounter EXAM: CT ABDOMEN AND PELVIS WITHOUT CONTRAST TECHNIQUE: Multidetector CT imaging of the abdomen and pelvis was performed following the standard protocol without IV contrast. RADIATION DOSE REDUCTION: This exam was performed according to the departmental dose-optimization program which includes automated exposure control, adjustment of the mA and/or kV according to patient size and/or use of iterative reconstruction technique. COMPARISON:  10/19/2022 FINDINGS: Lower chest: Lung bases are free of acute infiltrate or sizable  effusion. Cardiac blood pool is decreased in attenuation consistent with underlying anemia. Hepatobiliary: Gallbladder is within normal limits. Liver shows no focal abnormality. Very mild Peri hepatic fluid is noted. Pancreas: Diffuse calcifications are noted in the head and uncinate process and to a lesser degree within the body of the pancreas consistent with chronic pancreatitis. Superimposed inflammatory change in the pancreas is noted consistent with acute on chronic pancreatitis. Persistent cystic lesion is noted within the tail of the pancreas again measuring approximately 2.4 cm. This however demonstrates increased density when compared with the recent CT likely related to some underlying hemorrhage within. Spleen: The spleen is significantly enlarged when compare with the prior exam. The previously seen perisplenic fluid collection is not well visualized. These changes are likely related to interval splenic hemorrhage. These changes would correspond with the patient's given clinical history of increased pain. Adrenals/Urinary Tract: Adrenal glands are within normal limits. Kidneys demonstrate no renal calculi or obstructive changes. Simple cyst is  noted in the upper pole of the right kidney. No further follow-up is recommended. The bladder is decompressed. Stomach/Bowel: The appendix is not well visualized. No inflammatory changes to suggest appendicitis are noted. Small bowel and stomach appear within normal limits. Vascular/Lymphatic: Aortic atherosclerosis. No enlarged abdominal or pelvic lymph nodes. Reproductive: Pancreas appears within normal limits. Other: Considerable free fluid is noted which is of greater density than that expected for free fluid and with what appears to be a hematocrit level deep in the pelvis. These changes are consistent with intra-abdominal hemorrhage. Musculoskeletal: Degenerative changes of lumbar spine are noted. No acute bony abnormality is seen. IMPRESSION: Changes consistent with persistent acute pancreatitis. There has been interval hemorrhage within the cystic lesion within the tail the pancreas when compared with the prior CT examination. The spleen is now significantly enlarged and the splenic parenchyma is not well delineated. Given the perisplenic fluid seen previously and increase in free abdominal fluid with hematocrit, these changes are felt to be related to splenic hemorrhage and likely rupture into the peritoneal cavity. Changes of underlying anemia with decreased density of the cardiac blood pool. Critical Value/emergent results were called by telephone at the time of interpretation on 10/30/2022 at 8:28 pm to Dr. Sherwood Gambler , who verbally acknowledged these results. Electronically Signed   By: Inez Catalina M.D.   On: 10/30/2022 20:37   DG Chest Portable 1 View  Result Date: 10/30/2022 CLINICAL DATA:  Syncope.  Nausea.  Vomiting. EXAM: PORTABLE CHEST 1 VIEW COMPARISON:  CXR 10/19/22 FINDINGS: No pleural effusion. No pneumothorax. No focal airspace opacity. Normal cardiac and mediastinal contours. No radiographically apparent displaced rib fractures. Visualized upper abdomen is unremarkable. IMPRESSION: No  acute cardiopulmonary disease. Electronically Signed   By: Marin Roberts M.D.   On: 10/30/2022 18:22    Scheduled Meds:  Chlorhexidine Gluconate Cloth  6 each Topical Q2000   lip balm   Topical BID   pantoprazole (PROTONIX) IV  40 mg Intravenous Q12H   polycarbophil  625 mg Oral BID   Continuous Infusions:  cefTRIAXone (ROCEPHIN)  IV Stopped (11/01/22 1051)   lactated ringers     lactated ringers 125 mL/hr at 11/01/22 1059   methocarbamol (ROBAXIN) IV     ondansetron (ZOFRAN) IV       LOS: 2 days   Shelly Coss, MD [facility] P2/24/2024, 11:13 AM

## 2022-11-01 NOTE — Progress Notes (Signed)
Subjective: Patient states he continues to have abdominal pain. Currently drinking oral contrast for CAT scan being planned for today. No further episodes of coffee-ground emesis.  Objective: Vital signs in last 24 hours: Temp:  [97.7 F (36.5 C)-98.7 F (37.1 C)] 98.7 F (37.1 C) (02/24 1058) Pulse Rate:  [74-100] 82 (02/24 0600) Resp:  [15-27] 18 (02/24 0600) BP: (124-166)/(75-98) 148/83 (02/24 0600) SpO2:  [90 %-100 %] 91 % (02/24 0600) Weight:  [55.5 kg] 55.5 kg (02/23 1300) Weight change: -5.736 kg Last BM Date : 10/30/22  PE: Cachectic GENERAL:Mild icterus, prominent pallor  ABDOMEN: Distended abdomen, generalized tenderness, hypoactive bowel sounds EXTREMITIES: No deformity  Lab Results: Results for orders placed or performed during the hospital encounter of 10/30/22 (from the past 48 hour(s))  Comprehensive metabolic panel     Status: Abnormal   Collection Time: 10/30/22  6:19 PM  Result Value Ref Range   Sodium 130 (L) 135 - 145 mmol/L   Potassium 4.6 3.5 - 5.1 mmol/L    Comment: HEMOLYSIS AT THIS LEVEL MAY AFFECT RESULT   Chloride 103 98 - 111 mmol/L   CO2 18 (L) 22 - 32 mmol/L   Glucose, Bld 142 (H) 70 - 99 mg/dL    Comment: Glucose reference range applies only to samples taken after fasting for at least 8 hours.   BUN 29 (H) 8 - 23 mg/dL   Creatinine, Ser 2.27 (H) 0.61 - 1.24 mg/dL   Calcium 7.5 (L) 8.9 - 10.3 mg/dL   Total Protein 6.5 6.5 - 8.1 g/dL   Albumin 2.4 (L) 3.5 - 5.0 g/dL   AST 55 (H) 15 - 41 U/L    Comment: HEMOLYSIS AT THIS LEVEL MAY AFFECT RESULT   ALT 59 (H) 0 - 44 U/L    Comment: HEMOLYSIS AT THIS LEVEL MAY AFFECT RESULT   Alkaline Phosphatase 164 (H) 38 - 126 U/L   Total Bilirubin 4.1 (H) 0.3 - 1.2 mg/dL    Comment: HEMOLYSIS AT THIS LEVEL MAY AFFECT RESULT   GFR, Estimated 30 (L) >60 mL/min    Comment: (NOTE) Calculated using the CKD-EPI Creatinine Equation (2021)    Anion gap 9 5 - 15    Comment: Performed at [facility], [place] [address], [place], [place] [address]  Lipase, blood     Status: Abnormal   Collection Time: 10/30/22  6:19 PM  Result Value Ref Range   Lipase 276 (H) 11 - 51 U/L    Comment: Performed at [facility], [place] [address] [place], [place] [address]  Troponin I (High Sensitivity)     Status: None   Collection Time: 10/30/22  6:19 PM  Result Value Ref Range   Troponin I (High Sensitivity) 8 <18 ng/L    Comment: (NOTE) Elevated high sensitivity troponin I (hsTnI) values and significant  changes across serial measurements may suggest ACS but many other  chronic and acute conditions are known to elevate hsTnI results.  Refer to the "Links" section for chest pain algorithms and additional  guidance. Performed at [facility], [place] [address] [place], [place] [address]   CBC with Differential     Status: Abnormal   Collection Time: 10/30/22  6:19 PM  Result Value Ref Range   WBC 19.4 (H) 4.0 - 10.5 K/uL   RBC 1.81 (L) 4.22 - 5.81 MIL/uL   Hemoglobin 6.1 (LL) 13.0 - 17.0 g/dL    Comment: REPEATED TO VERIFY THIS CRITICAL RESULT HAS VERIFIED  AND BEEN CALLED TO BERRIER,CANDICE RN BY Kinsley BLACK ON 02 22 2024 AT 1918, AND HAS BEEN READ BACK. CRITICAL HGB CALLED TO BERRIER,C RN    HCT 18.2 (L) 39.0 - 52.0 %   MCV 100.6 (H) 80.0 - 100.0 fL   MCH 33.7 26.0 - 34.0 pg   MCHC 33.5 30.0 - 36.0 g/dL   RDW 17.0 (H) 11.5 - 15.5 %   Platelets 354 150 - 400 K/uL   nRBC 0.0 0.0 - 0.2 %   Neutrophils Relative % 82 %   Neutro Abs 15.8 (H) 1.7 - 7.7 K/uL   Lymphocytes Relative 8 %   Lymphs Abs 1.6 0.7 - 4.0 K/uL   Monocytes Relative 9 %   Monocytes Absolute 1.8 (H) 0.1 - 1.0 K/uL   Eosinophils Relative 0 %   Eosinophils Absolute 0.0 0.0 - 0.5 K/uL   Basophils Relative 0 %   Basophils Absolute 0.0 0.0 - 0.1 K/uL   Immature Granulocytes 1 %   Abs Immature Granulocytes 0.13 (H) 0.00 - 0.07 K/uL    Comment: Performed at [facility], [place] [address] [place], [place] [address]  POC occult blood, ED     Status: None   Collection Time: 10/30/22  7:35 PM  Result Value Ref Range   Fecal Occult Bld NEGATIVE NEGATIVE  Type and screen [place]     Status: None (Preliminary result)   Collection Time: 10/30/22  8:31 PM  Result Value Ref Range   ABO/RH(D) O POS    Antibody Screen NEG    Sample Expiration 11/02/2022,2359    Unit Number [id number]    Blood Component Type RED CELLS,LR    Unit division 00    Status of Unit ISSUED,FINAL    Transfusion Status OK TO TRANSFUSE    Crossmatch Result      Compatible Performed at [facility], [place] [address] [place], [place] [address]    Unit Number [id number]    Blood Component Type RED CELLS,LR    Unit division 00    Status of Unit ISSUED    Transfusion Status OK TO TRANSFUSE    Crossmatch Result Compatible    Unit Number [id number]    Blood Component Type RBC LR PHER1    Unit division 00    Status of Unit ISSUED    Transfusion Status OK TO TRANSFUSE    Crossmatch Result Compatible    Unit Number [id number]    Blood Component Type RED CELLS,LR    Unit division 00    Status of Unit ALLOCATED    Transfusion Status OK TO TRANSFUSE    Crossmatch Result Compatible   Prepare RBC (crossmatch)     Status: None   Collection Time: 10/30/22  8:31 PM  Result Value Ref Range   Order Confirmation      ORDER PROCESSED BY BLOOD BANK Performed at [facility], [place] [address], [place], [place] [address]   Troponin I (High Sensitivity)     Status: None   Collection Time: 10/30/22  8:31 PM  Result Value Ref Range   Troponin I (High Sensitivity) 11 <18 ng/L    Comment: (NOTE) Elevated high sensitivity troponin I (hsTnI) values and significant  changes across serial measurements may suggest ACS but many other  chronic and acute conditions are known to elevate hsTnI results.  Refer to the "Links" section  for chest pain algorithms and additional  guidance. Performed at [facility], [place] [address] [place], [place] [address]   Vitamin B12     Status: None   Collection Time: 10/30/22  8:31 PM  Result Value Ref Range   Vitamin B-12 451 180 - 914 pg/mL    Comment: (NOTE) This assay is not validated for testing neonatal or myeloproliferative syndrome specimens for Vitamin B12 levels. Performed at [facility], [place] [address], [place], [place] [address]   Folate     Status: None   Collection Time: 10/30/22  8:31 PM  Result Value Ref Range   Folate 8.6 >5.9 ng/mL    Comment: Performed at [facility], [place] [address] [place], [place] [address]  Iron and TIBC     Status: Abnormal   Collection Time: 10/30/22  8:31 PM  Result Value Ref Range   Iron 44 (L) 45 - 182 ug/dL   TIBC 290 250 - 450 ug/dL   Saturation Ratios 15 (L) 17.9 - 39.5 %   UIBC 246 ug/dL    Comment: Performed at [facility], [place] [address] [place], [place] [address]  Ferritin     Status: Abnormal   Collection Time: 10/30/22  8:31 PM  Result Value Ref Range   Ferritin 618 (H) 24 - 336 ng/mL    Comment: Performed at [facility], [place] [address], [place], [place] [address]  Reticulocytes     Status: Abnormal   Collection Time: 10/30/22  8:31 PM  Result Value Ref Range   Retic Ct Pct 6.1 (H) 0.4 - 3.1 %   RBC. 1.70 (L) 4.22 - 5.81 MIL/uL   Retic Count, Absolute 103.5 19.0 - 186.0 K/uL   Immature Retic Fract 29.2 (H) 2.3 - 15.9 %    Comment: Performed at [facility], [place] [address], [place], [place] [address]  Prealbumin     Status: Abnormal   Collection Time: 10/30/22  8:31 PM  Result Value Ref Range   Prealbumin 11 (L) 18 - 38 mg/dL    Comment: Performed at [facility], [place] [address] [place], [place] [address]  ABO/Rh     Status: None   Collection Time: 10/30/22  8:32 PM  Result Value Ref  Range   ABO/RH(D)      O POS Performed at [facility], [place] [address] [place], [place] [address]   Protime-INR     Status: None   Collection Time: 10/30/22 11:11 PM  Result Value Ref Range   Prothrombin Time 15.2 11.4 - 15.2 seconds   INR 1.2 0.8 - 1.2    Comment: (NOTE) INR goal varies based on device and disease states. Performed at [facility], [place] [address], [place], [place] [address]   CBC     Status: Abnormal   Collection Time: 10/31/22  5:16 AM  Result Value Ref Range   WBC 18.5 (H) 4.0 - 10.5 K/uL   RBC 1.98 (L) 4.22 - 5.81 MIL/uL   Hemoglobin 6.5 (LL) 13.0 - 17.0 g/dL    Comment: CRITICAL VALUE NOTED.  VALUE IS CONSISTENT WITH PREVIOUSLY REPORTED AND CALLED VALUE. REPEATED TO VERIFY    HCT 18.9 (L) 39.0 - 52.0 %   MCV 95.5 80.0 - 100.0 fL   MCH 32.8 26.0 - 34.0 pg   MCHC 34.4 30.0 - 36.0 g/dL   RDW 19.4 (H) 11.5 - 15.5 %   Platelets 256 150 - 400 K/uL   nRBC 0.0 0.0 - 0.2 %  Comment: Performed at [facility], [place] [address], [place], [place] [address]  Prepare RBC (crossmatch)     Status: None   Collection Time: 10/31/22  6:00 AM  Result Value Ref Range   Order Confirmation      ORDER PROCESSED BY BLOOD BANK Performed at [facility], [place] [address], [place], [place] [address]   CBG monitoring, ED     Status: Abnormal   Collection Time: 10/31/22  6:41 AM  Result Value Ref Range   Glucose-Capillary 137 (H) 70 - 99 mg/dL    Comment: Glucose reference range applies only to samples taken after fasting for at least 8 hours.   Comment 1 Notify RN   Urinalysis, Routine w reflex microscopic -Urine, Clean Catch     Status: Abnormal   Collection Time: 10/31/22  6:43 AM  Result Value Ref Range   Color, Urine AMBER (A) YELLOW    Comment: BIOCHEMICALS MAY BE AFFECTED BY COLOR   APPearance CLEAR CLEAR   Specific Gravity, Urine 1.036 (H) 1.005 - 1.030   pH 5.0 5.0 - 8.0   Glucose, UA  NEGATIVE NEGATIVE mg/dL   Hgb urine dipstick NEGATIVE NEGATIVE   Bilirubin Urine NEGATIVE NEGATIVE   Ketones, ur NEGATIVE NEGATIVE mg/dL   Protein, ur 30 (A) NEGATIVE mg/dL   Nitrite NEGATIVE NEGATIVE   Leukocytes,Ua NEGATIVE NEGATIVE   RBC / HPF 0-5 0 - 5 RBC/hpf   WBC, UA 0-5 0 - 5 WBC/hpf   Bacteria, UA RARE (A) NONE SEEN   Squamous Epithelial / HPF 0-5 0 - 5 /HPF   Mucus PRESENT    Hyaline Casts, UA PRESENT     Comment: Performed at [facility], [place] [address] [place], [place] [address]  MRSA Next Gen by PCR, Nasal     Status: None   Collection Time: 10/31/22  1:00 PM   Specimen: Nasal Mucosa; Nasal Swab  Result Value Ref Range   MRSA by PCR Next Gen NOT DETECTED NOT DETECTED    Comment: (NOTE) The GeneXpert MRSA Assay (FDA approved for NASAL specimens only), is one component of a comprehensive MRSA colonization surveillance program. It is not intended to diagnose MRSA infection nor to guide or monitor treatment for MRSA infections. Test performance is not FDA approved in patients less than 81 years old. Performed at [facility], [place] [address], [place], [place] [address]   Glucose, capillary     Status: Abnormal   Collection Time: 10/31/22  1:36 PM  Result Value Ref Range   Glucose-Capillary 141 (H) 70 - 99 mg/dL    Comment: Glucose reference range applies only to samples taken after fasting for at least 8 hours.  CBC     Status: Abnormal   Collection Time: 10/31/22  3:35 PM  Result Value Ref Range   WBC 16.8 (H) 4.0 - 10.5 K/uL   RBC 2.85 (L) 4.22 - 5.81 MIL/uL   Hemoglobin 8.9 (L) 13.0 - 17.0 g/dL    Comment: POST TRANSFUSION SPECIMEN   HCT 26.2 (L) 39.0 - 52.0 %   MCV 91.9 80.0 - 100.0 fL   MCH 31.2 26.0 - 34.0 pg   MCHC 34.0 30.0 - 36.0 g/dL   RDW 18.6 (H) 11.5 - 15.5 %   Platelets 248 150 - 400 K/uL   nRBC 0.0 0.0 - 0.2 %    Comment: Performed at [facility], [place] [address], [place], [place]  [address]  Comprehensive metabolic  panel     Status: Abnormal   Collection Time: 10/31/22  3:35 PM  Result Value Ref Range   Sodium 135 135 - 145 mmol/L   Potassium 3.9 3.5 - 5.1 mmol/L   Chloride 108 98 - 111 mmol/L   CO2 22 22 - 32 mmol/L   Glucose, Bld 113 (H) 70 - 99 mg/dL    Comment: Glucose reference range applies only to samples taken after fasting for at least 8 hours.   BUN 29 (H) 8 - 23 mg/dL   Creatinine, Ser 1.49 (H) 0.61 - 1.24 mg/dL   Calcium 7.6 (L) 8.9 - 10.3 mg/dL   Total Protein 6.2 (L) 6.5 - 8.1 g/dL   Albumin 2.1 (L) 3.5 - 5.0 g/dL   AST 24 15 - 41 U/L   ALT 38 0 - 44 U/L   Alkaline Phosphatase 139 (H) 38 - 126 U/L   Total Bilirubin 3.1 (H) 0.3 - 1.2 mg/dL   GFR, Estimated 50 (L) >60 mL/min    Comment: (NOTE) Calculated using the CKD-EPI Creatinine Equation (2021)    Anion gap 5 5 - 15    Comment: Performed at [facility], [place] [address], [place], [place] [address]  Glucose, capillary     Status: Abnormal   Collection Time: 10/31/22  5:49 PM  Result Value Ref Range   Glucose-Capillary 116 (H) 70 - 99 mg/dL    Comment: Glucose reference range applies only to samples taken after fasting for at least 8 hours.   Comment 1 Notify RN    Comment 2 Document in Chart   Glucose, capillary     Status: Abnormal   Collection Time: 10/31/22 11:25 PM  Result Value Ref Range   Glucose-Capillary 116 (H) 70 - 99 mg/dL    Comment: Glucose reference range applies only to samples taken after fasting for at least 8 hours.   Comment 1 Notify RN    Comment 2 Document in Chart   CBC     Status: Abnormal   Collection Time: 11/01/22  6:16 AM  Result Value Ref Range   WBC 15.9 (H) 4.0 - 10.5 K/uL   RBC 2.68 (L) 4.22 - 5.81 MIL/uL   Hemoglobin 8.4 (L) 13.0 - 17.0 g/dL   HCT 25.1 (L) 39.0 - 52.0 %   MCV 93.7 80.0 - 100.0 fL   MCH 31.3 26.0 - 34.0 pg   MCHC 33.5 30.0 - 36.0 g/dL   RDW 19.4 (H) 11.5 - 15.5 %   Platelets 217 150 - 400 K/uL   nRBC 0.1 0.0 - 0.2 %     Comment: Performed at [facility], [place] [address] [place], [place] [address]  Comprehensive metabolic panel     Status: Abnormal   Collection Time: 11/01/22  6:16 AM  Result Value Ref Range   Sodium 135 135 - 145 mmol/L   Potassium 3.8 3.5 - 5.1 mmol/L   Chloride 101 98 - 111 mmol/L   CO2 24 22 - 32 mmol/L   Glucose, Bld 103 (H) 70 - 99 mg/dL    Comment: Glucose reference range applies only to samples taken after fasting for at least 8 hours.   BUN 23 8 - 23 mg/dL   Creatinine, Ser 1.22 0.61 - 1.24 mg/dL   Calcium 7.8 (L) 8.9 - 10.3 mg/dL   Total Protein 5.5 (L) 6.5 - 8.1 g/dL   Albumin 2.0 (L) 3.5 - 5.0 g/dL   AST 18 15 - 41 U/L  ALT 27 0 - 44 U/L   Alkaline Phosphatase 113 38 - 126 U/L   Total Bilirubin 3.1 (H) 0.3 - 1.2 mg/dL   GFR, Estimated >60 >60 mL/min    Comment: (NOTE) Calculated using the CKD-EPI Creatinine Equation (2021)    Anion gap 10 5 - 15    Comment: Performed at [facility], [place] [address] [place], [place] [address]  Lipase, blood     Status: Abnormal   Collection Time: 11/01/22  6:16 AM  Result Value Ref Range   Lipase 120 (H) 11 - 51 U/L    Comment: Performed at [facility] [place], [place] [address], [place], [place] [address]  Glucose, capillary     Status: Abnormal   Collection Time: 11/01/22  7:25 AM  Result Value Ref Range   Glucose-Capillary 109 (H) 70 - 99 mg/dL    Comment: Glucose reference range applies only to samples taken after fasting for at least 8 hours.   Comment 1 Notify RN    Comment 2 Document in Chart   Glucose, capillary     Status: None   Collection Time: 11/01/22 12:00 PM  Result Value Ref Range   Glucose-Capillary 85 70 - 99 mg/dL    Comment: Glucose reference range applies only to samples taken after fasting for at least 8 hours.    Studies/Results: IR US Guide Vasc Access Right  Result Date: 10/31/2022 INDICATION: Pancreatitis and spontaneous splenic hemorrhage with  parenchymal subcapsular hematoma. Hemoglobin has not responded appropriately to blood transfusion and the patient now presents for splenic artery embolization. EXAM: 1. ULTRASOUND GUIDANCE FOR VASCULAR ACCESS OF THE RIGHT COMMON FEMORAL ARTERY 2. VISCERAL SELECTIVE ARTERIOGRAPHY OF THE SPLENIC ARTERY 3. ADDITIONAL SELECTIVE ARTERIOGRAPHY OF THE DISTAL SPLENIC ARTERY 4. TRANSCATHETER EMBOLIZATION OF THE SPLENIC ARTERY TO TREAT ARTERIAL HEMORRHAGE MEDICATIONS: 2 g IV Ancef. The antibiotic was administered within 1 hour of the procedure ANESTHESIA/SEDATION: Moderate (conscious) sedation was employed during this procedure. A total of Versed 2.0 mg and Fentanyl 100 mcg was administered intravenously. Moderate Sedation Time: 55 minutes. The patient's level of consciousness and vital signs were monitored continuously by radiology nursing throughout the procedure under my direct supervision. CONTRAST:  25 mL Visipaque 320 FLUOROSCOPY TIME:  Radiation Exposure Index (as provided by the fluoroscopic device): [id number] mGy Kerma COMPLICATIONS: None immediate. PROCEDURE: Informed consent was obtained from the patient following explanation of the procedure, risks, benefits and alternatives. The patient understands, agrees and consents for the procedure. All questions were addressed. A time out was performed prior to the initiation of the procedure. Maximal barrier sterile technique utilized including caps, mask, sterile gowns, sterile gloves, large sterile drape, hand hygiene, and chlorhexidine prep. During the procedure local anesthesia was provided with 1% lidocaine. Ultrasound was used to confirm patency of the right common femoral artery. A permanent ultrasound image was saved and recorded. The right common femoral artery was accessed utilizing a micropuncture set. A 5 French sheath was placed. A 5 French Cobra catheter was advanced into the abdominal aorta. The catheter was used to selectively catheterize the celiac axis. The  catheter was then used to selectively catheterize the proximal splenic artery. Selective splenic arteriography was performed. A microcatheter was advanced through the 5 French catheter and further advanced into the distal aspect of the splenic artery. Additional selective splenic arteriography was performed. Embolization was performed through the microcatheter with initial deployment of a Medtronic MVP 5 vascular plug. Additional arteriography was performed through the microcatheter as well  as periodic injection of contrast material under fluoroscopy to assess flow in the splenic artery. The microcatheter was removed. The 5 French catheter was then further advanced into the splenic artery. Additional arteriography was performed. An MVP 7 vascular plug was then deployed through the 5 [place] catheter. Additional arteriography was then performed through the 5 French catheter. The catheter was then removed. Hemostasis was obtained at the sheath access site with the Angio-Seal device. FINDINGS: Initial splenic arteriography demonstrates a tortuous splenic artery supplying an enlarged spleen. Intra-splenic branches are beaded and tortuous especially in the lower pole of the spleen. There is some displacement of vessels in the central aspect of the splenic parenchyma likely related to intraparenchymal hemorrhage. No evidence of overt contrast extravasation or arterial pseudoaneurysm. Additional selective arteriography was performed near the splenic hilum in order to define bifurcation point of the main splenic artery. After placement of a single vascular plug, there was still flow noted around the plug and eventually the plug migrated in the artery slightly towards the splenic hilum. A second, larger vascular plug was therefore deployed in the distal splenic artery with significant reduction in flow and adequate reduce flow noted. IMPRESSION: Successful embolization of the splenic artery with placement of two separate  vascular plugs. Electronically Signed   By: [name] M.D.   On: 10/31/2022 16:43   IR Angiogram Visceral Selective  Result Date: 10/31/2022 INDICATION: Pancreatitis and spontaneous splenic hemorrhage with parenchymal subcapsular hematoma. Hemoglobin has not responded appropriately to blood transfusion and the patient now presents for splenic artery embolization. EXAM: 1. ULTRASOUND GUIDANCE FOR VASCULAR ACCESS OF THE RIGHT COMMON FEMORAL ARTERY 2. VISCERAL SELECTIVE ARTERIOGRAPHY OF THE SPLENIC ARTERY 3. ADDITIONAL SELECTIVE ARTERIOGRAPHY OF THE DISTAL SPLENIC ARTERY 4. TRANSCATHETER EMBOLIZATION OF THE SPLENIC ARTERY TO TREAT ARTERIAL HEMORRHAGE MEDICATIONS: 2 g IV Ancef. The antibiotic was administered within 1 hour of the procedure ANESTHESIA/SEDATION: Moderate (conscious) sedation was employed during this procedure. A total of Versed 2.0 mg and Fentanyl 100 mcg was administered intravenously. Moderate Sedation Time: 55 minutes. The patient's level of consciousness and vital signs were monitored continuously by radiology nursing throughout the procedure under my direct supervision. CONTRAST:  25 mL Visipaque 320 FLUOROSCOPY TIME:  Radiation Exposure Index (as provided by the fluoroscopic device): [id number] mGy Kerma COMPLICATIONS: None immediate. PROCEDURE: Informed consent was obtained from the patient following explanation of the procedure, risks, benefits and alternatives. The patient understands, agrees and consents for the procedure. All questions were addressed. A time out was performed prior to the initiation of the procedure. Maximal barrier sterile technique utilized including caps, mask, sterile gowns, sterile gloves, large sterile drape, hand hygiene, and chlorhexidine prep. During the procedure local anesthesia was provided with 1% lidocaine. Ultrasound was used to confirm patency of the right common femoral artery. A permanent ultrasound image was saved and recorded. The right common femoral  artery was accessed utilizing a micropuncture set. A 5 French sheath was placed. A 5 French Cobra catheter was advanced into the abdominal aorta. The catheter was used to selectively catheterize the celiac axis. The catheter was then used to selectively catheterize the proximal splenic artery. Selective splenic arteriography was performed. A microcatheter was advanced through the 5 French catheter and further advanced into the distal aspect of the splenic artery. Additional selective splenic arteriography was performed. Embolization was performed through the microcatheter with initial deployment of a Medtronic MVP 5 vascular plug. Additional arteriography was performed through the microcatheter as well as periodic injection of contrast material  under fluoroscopy to assess flow in the splenic artery. The microcatheter was removed. The 5 French catheter was then further advanced into the splenic artery. Additional arteriography was performed. An MVP 7 vascular plug was then deployed through the 5 [place] catheter. Additional arteriography was then performed through the 5 French catheter. The catheter was then removed. Hemostasis was obtained at the sheath access site with the Angio-Seal device. FINDINGS: Initial splenic arteriography demonstrates a tortuous splenic artery supplying an enlarged spleen. Intra-splenic branches are beaded and tortuous especially in the lower pole of the spleen. There is some displacement of vessels in the central aspect of the splenic parenchyma likely related to intraparenchymal hemorrhage. No evidence of overt contrast extravasation or arterial pseudoaneurysm. Additional selective arteriography was performed near the splenic hilum in order to define bifurcation point of the main splenic artery. After placement of a single vascular plug, there was still flow noted around the plug and eventually the plug migrated in the artery slightly towards the splenic hilum. A second, larger vascular  plug was therefore deployed in the distal splenic artery with significant reduction in flow and adequate reduce flow noted. IMPRESSION: Successful embolization of the splenic artery with placement of two separate vascular plugs. Electronically Signed   By: [name] M.D.   On: 10/31/2022 16:43   IR Angiogram Selective Each Additional Vessel  Result Date: 10/31/2022 INDICATION: Pancreatitis and spontaneous splenic hemorrhage with parenchymal subcapsular hematoma. Hemoglobin has not responded appropriately to blood transfusion and the patient now presents for splenic artery embolization. EXAM: 1. ULTRASOUND GUIDANCE FOR VASCULAR ACCESS OF THE RIGHT COMMON FEMORAL ARTERY 2. VISCERAL SELECTIVE ARTERIOGRAPHY OF THE SPLENIC ARTERY 3. ADDITIONAL SELECTIVE ARTERIOGRAPHY OF THE DISTAL SPLENIC ARTERY 4. TRANSCATHETER EMBOLIZATION OF THE SPLENIC ARTERY TO TREAT ARTERIAL HEMORRHAGE MEDICATIONS: 2 g IV Ancef. The antibiotic was administered within 1 hour of the procedure ANESTHESIA/SEDATION: Moderate (conscious) sedation was employed during this procedure. A total of Versed 2.0 mg and Fentanyl 100 mcg was administered intravenously. Moderate Sedation Time: 55 minutes. The patient's level of consciousness and vital signs were monitored continuously by radiology nursing throughout the procedure under my direct supervision. CONTRAST:  25 mL Visipaque 320 FLUOROSCOPY TIME:  Radiation Exposure Index (as provided by the fluoroscopic device): [id number] mGy Kerma COMPLICATIONS: None immediate. PROCEDURE: Informed consent was obtained from the patient following explanation of the procedure, risks, benefits and alternatives. The patient understands, agrees and consents for the procedure. All questions were addressed. A time out was performed prior to the initiation of the procedure. Maximal barrier sterile technique utilized including caps, mask, sterile gowns, sterile gloves, large sterile drape, hand hygiene, and chlorhexidine prep.  During the procedure local anesthesia was provided with 1% lidocaine. Ultrasound was used to confirm patency of the right common femoral artery. A permanent ultrasound image was saved and recorded. The right common femoral artery was accessed utilizing a micropuncture set. A 5 French sheath was placed. A 5 French Cobra catheter was advanced into the abdominal aorta. The catheter was used to selectively catheterize the celiac axis. The catheter was then used to selectively catheterize the proximal splenic artery. Selective splenic arteriography was performed. A microcatheter was advanced through the 5 French catheter and further advanced into the distal aspect of the splenic artery. Additional selective splenic arteriography was performed. Embolization was performed through the microcatheter with initial deployment of a Medtronic MVP 5 vascular plug. Additional arteriography was performed through the microcatheter as well as periodic injection of contrast material under fluoroscopy to assess  flow in the splenic artery. The microcatheter was removed. The 5 French catheter was then further advanced into the splenic artery. Additional arteriography was performed. An MVP 7 vascular plug was then deployed through the 5 [place] catheter. Additional arteriography was then performed through the 5 French catheter. The catheter was then removed. Hemostasis was obtained at the sheath access site with the Angio-Seal device. FINDINGS: Initial splenic arteriography demonstrates a tortuous splenic artery supplying an enlarged spleen. Intra-splenic branches are beaded and tortuous especially in the lower pole of the spleen. There is some displacement of vessels in the central aspect of the splenic parenchyma likely related to intraparenchymal hemorrhage. No evidence of overt contrast extravasation or arterial pseudoaneurysm. Additional selective arteriography was performed near the splenic hilum in order to define bifurcation point  of the main splenic artery. After placement of a single vascular plug, there was still flow noted around the plug and eventually the plug migrated in the artery slightly towards the splenic hilum. A second, larger vascular plug was therefore deployed in the distal splenic artery with significant reduction in flow and adequate reduce flow noted. IMPRESSION: Successful embolization of the splenic artery with placement of two separate vascular plugs. Electronically Signed   By: [name] M.D.   On: 10/31/2022 16:43   IR EMBO ART  VEN HEMORR LYMPH EXTRAV  INC GUIDE ROADMAPPING  Result Date: 10/31/2022 INDICATION: Pancreatitis and spontaneous splenic hemorrhage with parenchymal subcapsular hematoma. Hemoglobin has not responded appropriately to blood transfusion and the patient now presents for splenic artery embolization. EXAM: 1. ULTRASOUND GUIDANCE FOR VASCULAR ACCESS OF THE RIGHT COMMON FEMORAL ARTERY 2. VISCERAL SELECTIVE ARTERIOGRAPHY OF THE SPLENIC ARTERY 3. ADDITIONAL SELECTIVE ARTERIOGRAPHY OF THE DISTAL SPLENIC ARTERY 4. TRANSCATHETER EMBOLIZATION OF THE SPLENIC ARTERY TO TREAT ARTERIAL HEMORRHAGE MEDICATIONS: 2 g IV Ancef. The antibiotic was administered within 1 hour of the procedure ANESTHESIA/SEDATION: Moderate (conscious) sedation was employed during this procedure. A total of Versed 2.0 mg and Fentanyl 100 mcg was administered intravenously. Moderate Sedation Time: 55 minutes. The patient's level of consciousness and vital signs were monitored continuously by radiology nursing throughout the procedure under my direct supervision. CONTRAST:  25 mL Visipaque 320 FLUOROSCOPY TIME:  Radiation Exposure Index (as provided by the fluoroscopic device): [id number] mGy Kerma COMPLICATIONS: None immediate. PROCEDURE: Informed consent was obtained from the patient following explanation of the procedure, risks, benefits and alternatives. The patient understands, agrees and consents for the procedure. All questions  were addressed. A time out was performed prior to the initiation of the procedure. Maximal barrier sterile technique utilized including caps, mask, sterile gowns, sterile gloves, large sterile drape, hand hygiene, and chlorhexidine prep. During the procedure local anesthesia was provided with 1% lidocaine. Ultrasound was used to confirm patency of the right common femoral artery. A permanent ultrasound image was saved and recorded. The right common femoral artery was accessed utilizing a micropuncture set. A 5 French sheath was placed. A 5 French Cobra catheter was advanced into the abdominal aorta. The catheter was used to selectively catheterize the celiac axis. The catheter was then used to selectively catheterize the proximal splenic artery. Selective splenic arteriography was performed. A microcatheter was advanced through the 5 French catheter and further advanced into the distal aspect of the splenic artery. Additional selective splenic arteriography was performed. Embolization was performed through the microcatheter with initial deployment of a Medtronic MVP 5 vascular plug. Additional arteriography was performed through the microcatheter as well as periodic injection of contrast material under fluoroscopy  to assess flow in the splenic artery. The microcatheter was removed. The 5 French catheter was then further advanced into the splenic artery. Additional arteriography was performed. An MVP 7 vascular plug was then deployed through the 5 [place] catheter. Additional arteriography was then performed through the 5 French catheter. The catheter was then removed. Hemostasis was obtained at the sheath access site with the Angio-Seal device. FINDINGS: Initial splenic arteriography demonstrates a tortuous splenic artery supplying an enlarged spleen. Intra-splenic branches are beaded and tortuous especially in the lower pole of the spleen. There is some displacement of vessels in the central aspect of the splenic  parenchyma likely related to intraparenchymal hemorrhage. No evidence of overt contrast extravasation or arterial pseudoaneurysm. Additional selective arteriography was performed near the splenic hilum in order to define bifurcation point of the main splenic artery. After placement of a single vascular plug, there was still flow noted around the plug and eventually the plug migrated in the artery slightly towards the splenic hilum. A second, larger vascular plug was therefore deployed in the distal splenic artery with significant reduction in flow and adequate reduce flow noted. IMPRESSION: Successful embolization of the splenic artery with placement of two separate vascular plugs. Electronically Signed   By: [name] M.D.   On: 10/31/2022 16:43   CT Angio Abd/Pel w/ and/or w/o  Result Date: 10/30/2022 CLINICAL DATA:  Splenomegaly, history of pancreatitis, abdominal pain, evidence of hemorrhage on previous CT EXAM: CTA ABDOMEN AND PELVIS WITHOUT AND WITH CONTRAST TECHNIQUE: Multidetector CT imaging of the abdomen and pelvis was performed using the standard protocol during bolus administration of intravenous contrast. Multiplanar reconstructed images and MIPs were obtained and reviewed to evaluate the vascular anatomy. RADIATION DOSE REDUCTION: This exam was performed according to the departmental dose-optimization program which includes automated exposure control, adjustment of the mA and/or kV according to patient size and/or use of iterative reconstruction technique. CONTRAST:  22m OMNIPAQUE IOHEXOL 350 MG/ML SOLN COMPARISON:  10/30/2022, 10/20/2022, 10/19/2022 FINDINGS: VASCULAR Aorta: Normal caliber aorta without aneurysm, dissection, vasculitis or significant stenosis. Atherosclerosis. Celiac: Patent without evidence of aneurysm, dissection, vasculitis or significant stenosis. SMA: Patent without evidence of aneurysm, dissection, vasculitis or significant stenosis. Renals: Both renal arteries are  patent without evidence of aneurysm, dissection, vasculitis, fibromuscular dysplasia or significant stenosis. IMA: Patent without evidence of aneurysm, dissection, vasculitis or significant stenosis. Inflow: Patent without evidence of aneurysm, dissection, vasculitis or significant stenosis. Proximal Outflow: Bilateral common femoral and visualized portions of the superficial and profunda femoral arteries are patent without evidence of aneurysm, dissection, vasculitis or significant stenosis. Veins: There is mild extrinsic compression upon the main portal vein due to the inflammatory changes of the pancreas. 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 Remaining venous structures are unremarkable. Review of the MIP images confirms the above findings. NON-VASCULAR Lower chest: Trace left pleural effusion. No acute airspace disease. Hepatobiliary: Stable intrahepatic and extrahepatic biliary duct dilation, with common bile duct measuring up to 11 mm. There is narrowing of the downstream common bile duct in the region of the pancreatic head. Minimal gallbladder sludge without evidence of cholecystitis. Stable benign hepatic cysts. Pancreas: Stable parenchymal calcifications within the head and body of the pancreas. There is decreased parenchymal enhancement within the region of the pancreatic head, consistent with edema and pancreatitis. Marked peripancreatic inflammatory change. There is a complex cystic structure adjacent to the tail the pancreas, measuring 4.1 x  2.6 cm, compatible with the hemorrhagic pseudocyst seen on previous imaging. No evidence of contrast extravasation to suggest active hemorrhage. Spleen: The spleen remains enlarged and heterogeneous, measuring approximately 10.0 x 7.7 by 10.5 cm in size. The diffuse heterogeneity of the splenic parenchyma seen previously is again noted, consistent  with intracapsular hemorrhage. There is no evidence of contrast extravasation to suggest active bleeding. While no distinct capsular defect is identified, significant perisplenic fat stranding in the left upper quadrant as well as the high attenuation fluid throughout the abdomen and pelvis is concerning for splenic rupture. Adrenals/Urinary Tract: Stable right renal atrophy. No urinary tract calculi or obstruction. Bladder is unremarkable. The adrenals are normal. Stomach/Bowel: No bowel obstruction or ileus. No bowel wall thickening or inflammatory change. Lymphatic: No pathologic adenopathy within the abdomen or pelvis. Reproductive: Stable appearance of the prostate. Other: High attenuation ascites throughout the abdomen and pelvis is unchanged, consistent with hemoperitoneum. No free intraperitoneal gas. No abdominal wall hernia. Musculoskeletal: No acute or destructive bony lesions. Reconstructed images demonstrate no additional findings. IMPRESSION: VASCULAR 1. No evidence of contrast extravasation to suggest active intra-abdominal or intrapelvic hemorrhage. 2. 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 3.  Aortic Atherosclerosis (ICD10-I70.0). NON-VASCULAR 1. Stable heterogeneous enlargement of the spleen, with perisplenic fat stranding and high attenuation fluid throughout the abdomen and pelvis consistent with intra splenic hemorrhage and likely capsular rupture. 2. Stable changes of pancreatitis, with heterogeneous fluid collection at the pancreatic tail consistent with hemorrhagic pseudocyst. 3. Stable high attenuation free fluid throughout the abdomen and pelvis compatible with hemoperitoneum. 4. Stable intrahepatic and extrahepatic biliary duct dilation, with narrowing of the downstream common bile duct at the level of the pancreatic head. Please see  recent MRI evaluation. 5. Gallbladder sludge without cholelithiasis or cholecystitis. 6. Trace left pleural effusion. Electronically Signed   By: Randa Ngo M.D.   On: 10/30/2022 22:37   CT ABDOMEN PELVIS WO CONTRAST  Result Date: 10/30/2022 CLINICAL DATA:  Abdominal pain and syncope, history of pancreatitis, initial encounter EXAM: CT ABDOMEN AND PELVIS WITHOUT CONTRAST TECHNIQUE: Multidetector CT imaging of the abdomen and pelvis was performed following the standard protocol without IV contrast. RADIATION DOSE REDUCTION: This exam was performed according to the departmental dose-optimization program which includes automated exposure control, adjustment of the mA and/or kV according to patient size and/or use of iterative reconstruction technique. COMPARISON:  10/19/2022 FINDINGS: Lower chest: Lung bases are free of acute infiltrate or sizable effusion. Cardiac blood pool is decreased in attenuation consistent with underlying anemia. Hepatobiliary: Gallbladder is within normal limits. Liver shows no focal abnormality. Very mild Peri hepatic fluid is noted. Pancreas: Diffuse calcifications are noted in the head and uncinate process and to a lesser degree within the body of the pancreas consistent with chronic pancreatitis. Superimposed inflammatory change in the pancreas is noted consistent with acute on chronic pancreatitis. Persistent cystic lesion is noted within the tail of the pancreas again measuring approximately 2.4 cm. This however demonstrates increased density when compared with the recent CT likely related to some underlying hemorrhage within. Spleen: The spleen is significantly enlarged when compare with the prior exam. The previously seen perisplenic fluid collection is not well visualized. These changes are likely related to interval splenic hemorrhage. These changes would correspond with the patient's given clinical history of increased pain. Adrenals/Urinary Tract: Adrenal glands are within  normal  limits. Kidneys demonstrate no renal calculi or obstructive changes. Simple cyst is noted in the upper pole of the right kidney. No further follow-up is recommended. The bladder is decompressed. Stomach/Bowel: The appendix is not well visualized. No inflammatory changes to suggest appendicitis are noted. Small bowel and stomach appear within normal limits. Vascular/Lymphatic: Aortic atherosclerosis. No enlarged abdominal or pelvic lymph nodes. Reproductive: Pancreas appears within normal limits. Other: Considerable free fluid is noted which is of greater density than that expected for free fluid and with what appears to be a hematocrit level deep in the pelvis. These changes are consistent with intra-abdominal hemorrhage. Musculoskeletal: Degenerative changes of lumbar spine are noted. No acute bony abnormality is seen. IMPRESSION: Changes consistent with persistent acute pancreatitis. There has been interval hemorrhage within the cystic lesion within the tail the pancreas when compared with the prior CT examination. The spleen is now significantly enlarged and the splenic parenchyma is not well delineated. Given the perisplenic fluid seen previously and increase in free abdominal fluid with hematocrit, these changes are felt to be related to splenic hemorrhage and likely rupture into the peritoneal cavity. Changes of underlying anemia with decreased density of the cardiac blood pool. Critical Value/emergent results were called by telephone at the time of interpretation on 10/30/2022 at 8:28 pm to Dr. Sherwood Gambler , who verbally acknowledged these results. Electronically Signed   By: Inez Catalina M.D.   On: 10/30/2022 20:37   DG Chest Portable 1 View  Result Date: 10/30/2022 CLINICAL DATA:  Syncope.  Nausea.  Vomiting. EXAM: PORTABLE CHEST 1 VIEW COMPARISON:  CXR 10/19/22 FINDINGS: No pleural effusion. No pneumothorax. No focal airspace opacity. Normal cardiac and mediastinal contours. No radiographically  apparent displaced rib fractures. Visualized upper abdomen is unremarkable. IMPRESSION: No acute cardiopulmonary disease. Electronically Signed   By: Marin Roberts M.D.   On: 10/30/2022 18:22    Medications: I have reviewed the patient's current medications.  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 bili 3.1/AST normal 18, ALT normal 27, ALP normal 113 Malnutrition, total protein 5.5, albumin 2  Plan: Although patient had 2 episodes of coffee-ground emesis, no plans for endoscopic intervention unless frank hematemesis/melena/destabilizing upper GI bleeding noted. Repeat CAT scan for today Currently n.p.o., from GI standpoint if no surgical intervention is needed, okay to start clear liquids. Continue pantoprazole 40 mg IV every 12 hours, H&H monitoring and transfuse as needed.  Ronnette Juniper, MD 11/01/2022, 12:47 PM

## 2022-11-01 NOTE — Progress Notes (Signed)
Patient ID: Jared Duran, male   DOB: October 23, 1950, 72 y.o.   MRN: [id number]   I have reviewed his CAT scan of the abdomen pelvis.  There is no evidence of free air or any acute changes that would necessitate an exploratory laparotomy.  We will plan on continuing nonoperative management

## 2022-11-01 NOTE — Progress Notes (Signed)
Referring Physician(s): Dr. Michael Boston   Supervising Physician: Jacqulynn Cadet  Patient Status:  [facility] - In-pt  Chief Complaint:  Splenic rupture, symptomatic anemia  S/p main splenic artery embolization by Dr. Kathlene Cote on 10/31/22  Subjective:  Patient sitting in bed, NAD.  Reports  shortness of breath, palpitation, and lethargy.  RN states STAT CT was ordered due to abdominal pain this morning.   Allergies: Ethanol  Medications: Prior to Admission medications   Medication Sig Start Date End Date Taking? Authorizing Provider  hydrOXYzine (ATARAX) 25 MG tablet Take 25 mg by mouth 3 (three) times daily. 10/10/22  Yes [provider]  losartan (COZAAR) 100 MG tablet Take 100 mg by mouth daily. 07/25/22  Yes [provider]  NIFEdipine (ADALAT CC) 90 MG 24 hr tablet Take 90 mg by mouth daily. 10/27/22  Yes [provider]  oxyCODONE (ROXICODONE) 5 MG immediate release tablet Take 1 tablet (5 mg total) by mouth every 6 (six) hours as needed. Patient taking differently: Take 5 mg by mouth as needed for moderate pain or severe pain. 10/23/22 10/23/23 Yes Dwyane Dee, MD  pantoprazole (PROTONIX) 20 MG tablet Take 20 mg by mouth daily. 08/11/22  Yes [provider]  sertraline (ZOLOFT) 25 MG tablet Take 25 mg by mouth at bedtime. Patient not taking: Reported on 10/31/2022 10/10/22   [provider]     Vital Signs: BP (!) 148/83   Pulse 82   Temp 98.2 F (36.8 C) (Axillary)   Resp 18   Ht '5\' 7"'$  (1.702 m)   Wt 122 lb 5.7 oz (55.5 kg)   SpO2 91%   BMI 19.16 kg/m   Physical Exam Vitals reviewed.  Constitutional:      General: He is not in acute distress. HENT:     Head: Normocephalic.  Cardiovascular:     Rate and Rhythm: Normal rate and regular rhythm.  Pulmonary:     Effort: Pulmonary effort is normal.  Abdominal:     General: Abdomen is flat.     Palpations: Abdomen is soft.  Skin:    General: Skin is warm and dry.      Coloration: Skin is not cyanotic, jaundiced or pale.     Comments: Positive dressing on R CFA puncture site. Site is clean and dry, but extremely tender to palpitation and there is firmness at 2 o' clock. No thrill felt, strong right femoral pulse.  R DP 2+    Neurological:     Mental Status: He is alert.  Psychiatric:        Mood and Affect: Mood normal.        Behavior: Behavior normal.     Imaging: IR US Guide Vasc Access Right  Result Date: 10/31/2022 INDICATION: Pancreatitis and spontaneous splenic hemorrhage with parenchymal subcapsular hematoma. Hemoglobin has not responded appropriately to blood transfusion and the patient now presents for splenic artery embolization. EXAM: 1. ULTRASOUND GUIDANCE FOR VASCULAR ACCESS OF THE RIGHT COMMON FEMORAL ARTERY 2. VISCERAL SELECTIVE ARTERIOGRAPHY OF THE SPLENIC ARTERY 3. ADDITIONAL SELECTIVE ARTERIOGRAPHY OF THE DISTAL SPLENIC ARTERY 4. TRANSCATHETER EMBOLIZATION OF THE SPLENIC ARTERY TO TREAT ARTERIAL HEMORRHAGE MEDICATIONS: 2 g IV Ancef. The antibiotic was administered within 1 hour of the procedure ANESTHESIA/SEDATION: Moderate (conscious) sedation was employed during this procedure. A total of Versed 2.0 mg and Fentanyl 100 mcg was administered intravenously. Moderate Sedation Time: 55 minutes. The patient's level of consciousness and vital signs were monitored continuously by radiology nursing throughout the  procedure under my direct supervision. CONTRAST:  25 mL Visipaque 320 FLUOROSCOPY TIME:  Radiation Exposure Index (as provided by the fluoroscopic device): [id number] mGy Kerma COMPLICATIONS: None immediate. PROCEDURE: Informed consent was obtained from the patient following explanation of the procedure, risks, benefits and alternatives. The patient understands, agrees and consents for the procedure. All questions were addressed. A time out was performed prior to the initiation of the procedure. Maximal barrier sterile technique utilized including  caps, mask, sterile gowns, sterile gloves, large sterile drape, hand hygiene, and chlorhexidine prep. During the procedure local anesthesia was provided with 1% lidocaine. Ultrasound was used to confirm patency of the right common femoral artery. A permanent ultrasound image was saved and recorded. The right common femoral artery was accessed utilizing a micropuncture set. A 5 French sheath was placed. A 5 French Cobra catheter was advanced into the abdominal aorta. The catheter was used to selectively catheterize the celiac axis. The catheter was then used to selectively catheterize the proximal splenic artery. Selective splenic arteriography was performed. A microcatheter was advanced through the 5 French catheter and further advanced into the distal aspect of the splenic artery. Additional selective splenic arteriography was performed. Embolization was performed through the microcatheter with initial deployment of a Medtronic MVP 5 vascular plug. Additional arteriography was performed through the microcatheter as well as periodic injection of contrast material under fluoroscopy to assess flow in the splenic artery. The microcatheter was removed. The 5 French catheter was then further advanced into the splenic artery. Additional arteriography was performed. An MVP 7 vascular plug was then deployed through the 5 [place] catheter. Additional arteriography was then performed through the 5 French catheter. The catheter was then removed. Hemostasis was obtained at the sheath access site with the Angio-Seal device. FINDINGS: Initial splenic arteriography demonstrates a tortuous splenic artery supplying an enlarged spleen. Intra-splenic branches are beaded and tortuous especially in the lower pole of the spleen. There is some displacement of vessels in the central aspect of the splenic parenchyma likely related to intraparenchymal hemorrhage. No evidence of overt contrast extravasation or arterial pseudoaneurysm.  Additional selective arteriography was performed near the splenic hilum in order to define bifurcation point of the main splenic artery. After placement of a single vascular plug, there was still flow noted around the plug and eventually the plug migrated in the artery slightly towards the splenic hilum. A second, larger vascular plug was therefore deployed in the distal splenic artery with significant reduction in flow and adequate reduce flow noted. IMPRESSION: Successful embolization of the splenic artery with placement of two separate vascular plugs. Electronically Signed   By: [name] M.D.   On: 10/31/2022 16:43   IR Angiogram Visceral Selective  Result Date: 10/31/2022 INDICATION: Pancreatitis and spontaneous splenic hemorrhage with parenchymal subcapsular hematoma. Hemoglobin has not responded appropriately to blood transfusion and the patient now presents for splenic artery embolization. EXAM: 1. ULTRASOUND GUIDANCE FOR VASCULAR ACCESS OF THE RIGHT COMMON FEMORAL ARTERY 2. VISCERAL SELECTIVE ARTERIOGRAPHY OF THE SPLENIC ARTERY 3. ADDITIONAL SELECTIVE ARTERIOGRAPHY OF THE DISTAL SPLENIC ARTERY 4. TRANSCATHETER EMBOLIZATION OF THE SPLENIC ARTERY TO TREAT ARTERIAL HEMORRHAGE MEDICATIONS: 2 g IV Ancef. The antibiotic was administered within 1 hour of the procedure ANESTHESIA/SEDATION: Moderate (conscious) sedation was employed during this procedure. A total of Versed 2.0 mg and Fentanyl 100 mcg was administered intravenously. Moderate Sedation Time: 55 minutes. The patient's level of consciousness and vital signs were monitored continuously by radiology nursing throughout the procedure under my direct supervision. CONTRAST:  25 mL Visipaque 320 FLUOROSCOPY TIME:  Radiation Exposure Index (as provided by the fluoroscopic device): [id number] mGy Kerma COMPLICATIONS: None immediate. PROCEDURE: Informed consent was obtained from the patient following explanation of the procedure, risks, benefits and  alternatives. The patient understands, agrees and consents for the procedure. All questions were addressed. A time out was performed prior to the initiation of the procedure. Maximal barrier sterile technique utilized including caps, mask, sterile gowns, sterile gloves, large sterile drape, hand hygiene, and chlorhexidine prep. During the procedure local anesthesia was provided with 1% lidocaine. Ultrasound was used to confirm patency of the right common femoral artery. A permanent ultrasound image was saved and recorded. The right common femoral artery was accessed utilizing a micropuncture set. A 5 French sheath was placed. A 5 French Cobra catheter was advanced into the abdominal aorta. The catheter was used to selectively catheterize the celiac axis. The catheter was then used to selectively catheterize the proximal splenic artery. Selective splenic arteriography was performed. A microcatheter was advanced through the 5 French catheter and further advanced into the distal aspect of the splenic artery. Additional selective splenic arteriography was performed. Embolization was performed through the microcatheter with initial deployment of a Medtronic MVP 5 vascular plug. Additional arteriography was performed through the microcatheter as well as periodic injection of contrast material under fluoroscopy to assess flow in the splenic artery. The microcatheter was removed. The 5 French catheter was then further advanced into the splenic artery. Additional arteriography was performed. An MVP 7 vascular plug was then deployed through the 5 [place] catheter. Additional arteriography was then performed through the 5 French catheter. The catheter was then removed. Hemostasis was obtained at the sheath access site with the Angio-Seal device. FINDINGS: Initial splenic arteriography demonstrates a tortuous splenic artery supplying an enlarged spleen. Intra-splenic branches are beaded and tortuous especially in the lower pole  of the spleen. There is some displacement of vessels in the central aspect of the splenic parenchyma likely related to intraparenchymal hemorrhage. No evidence of overt contrast extravasation or arterial pseudoaneurysm. Additional selective arteriography was performed near the splenic hilum in order to define bifurcation point of the main splenic artery. After placement of a single vascular plug, there was still flow noted around the plug and eventually the plug migrated in the artery slightly towards the splenic hilum. A second, larger vascular plug was therefore deployed in the distal splenic artery with significant reduction in flow and adequate reduce flow noted. IMPRESSION: Successful embolization of the splenic artery with placement of two separate vascular plugs. Electronically Signed   By: [name] M.D.   On: 10/31/2022 16:43   IR Angiogram Selective Each Additional Vessel  Result Date: 10/31/2022 INDICATION: Pancreatitis and spontaneous splenic hemorrhage with parenchymal subcapsular hematoma. Hemoglobin has not responded appropriately to blood transfusion and the patient now presents for splenic artery embolization. EXAM: 1. ULTRASOUND GUIDANCE FOR VASCULAR ACCESS OF THE RIGHT COMMON FEMORAL ARTERY 2. VISCERAL SELECTIVE ARTERIOGRAPHY OF THE SPLENIC ARTERY 3. ADDITIONAL SELECTIVE ARTERIOGRAPHY OF THE DISTAL SPLENIC ARTERY 4. TRANSCATHETER EMBOLIZATION OF THE SPLENIC ARTERY TO TREAT ARTERIAL HEMORRHAGE MEDICATIONS: 2 g IV Ancef. The antibiotic was administered within 1 hour of the procedure ANESTHESIA/SEDATION: Moderate (conscious) sedation was employed during this procedure. A total of Versed 2.0 mg and Fentanyl 100 mcg was administered intravenously. Moderate Sedation Time: 55 minutes. The patient's level of consciousness and vital signs were monitored continuously by radiology nursing throughout the procedure under my direct supervision. CONTRAST:  25 mL Visipaque 320  FLUOROSCOPY TIME:   Radiation Exposure Index (as provided by the fluoroscopic device): [id number] mGy Kerma COMPLICATIONS: None immediate. PROCEDURE: Informed consent was obtained from the patient following explanation of the procedure, risks, benefits and alternatives. The patient understands, agrees and consents for the procedure. All questions were addressed. A time out was performed prior to the initiation of the procedure. Maximal barrier sterile technique utilized including caps, mask, sterile gowns, sterile gloves, large sterile drape, hand hygiene, and chlorhexidine prep. During the procedure local anesthesia was provided with 1% lidocaine. Ultrasound was used to confirm patency of the right common femoral artery. A permanent ultrasound image was saved and recorded. The right common femoral artery was accessed utilizing a micropuncture set. A 5 French sheath was placed. A 5 French Cobra catheter was advanced into the abdominal aorta. The catheter was used to selectively catheterize the celiac axis. The catheter was then used to selectively catheterize the proximal splenic artery. Selective splenic arteriography was performed. A microcatheter was advanced through the 5 French catheter and further advanced into the distal aspect of the splenic artery. Additional selective splenic arteriography was performed. Embolization was performed through the microcatheter with initial deployment of a Medtronic MVP 5 vascular plug. Additional arteriography was performed through the microcatheter as well as periodic injection of contrast material under fluoroscopy to assess flow in the splenic artery. The microcatheter was removed. The 5 French catheter was then further advanced into the splenic artery. Additional arteriography was performed. An MVP 7 vascular plug was then deployed through the 5 [place] catheter. Additional arteriography was then performed through the 5 French catheter. The catheter was then removed. Hemostasis was obtained at the  sheath access site with the Angio-Seal device. FINDINGS: Initial splenic arteriography demonstrates a tortuous splenic artery supplying an enlarged spleen. Intra-splenic branches are beaded and tortuous especially in the lower pole of the spleen. There is some displacement of vessels in the central aspect of the splenic parenchyma likely related to intraparenchymal hemorrhage. No evidence of overt contrast extravasation or arterial pseudoaneurysm. Additional selective arteriography was performed near the splenic hilum in order to define bifurcation point of the main splenic artery. After placement of a single vascular plug, there was still flow noted around the plug and eventually the plug migrated in the artery slightly towards the splenic hilum. A second, larger vascular plug was therefore deployed in the distal splenic artery with significant reduction in flow and adequate reduce flow noted. IMPRESSION: Successful embolization of the splenic artery with placement of two separate vascular plugs. Electronically Signed   By: [name] M.D.   On: 10/31/2022 16:43   IR EMBO ART  VEN HEMORR LYMPH EXTRAV  INC GUIDE ROADMAPPING  Result Date: 10/31/2022 INDICATION: Pancreatitis and spontaneous splenic hemorrhage with parenchymal subcapsular hematoma. Hemoglobin has not responded appropriately to blood transfusion and the patient now presents for splenic artery embolization. EXAM: 1. ULTRASOUND GUIDANCE FOR VASCULAR ACCESS OF THE RIGHT COMMON FEMORAL ARTERY 2. VISCERAL SELECTIVE ARTERIOGRAPHY OF THE SPLENIC ARTERY 3. ADDITIONAL SELECTIVE ARTERIOGRAPHY OF THE DISTAL SPLENIC ARTERY 4. TRANSCATHETER EMBOLIZATION OF THE SPLENIC ARTERY TO TREAT ARTERIAL HEMORRHAGE MEDICATIONS: 2 g IV Ancef. The antibiotic was administered within 1 hour of the procedure ANESTHESIA/SEDATION: Moderate (conscious) sedation was employed during this procedure. A total of Versed 2.0 mg and Fentanyl 100 mcg was administered intravenously.  Moderate Sedation Time: 55 minutes. The patient's level of consciousness and vital signs were monitored continuously by radiology nursing throughout the procedure under my direct supervision. CONTRAST:  25  mL Visipaque 320 FLUOROSCOPY TIME:  Radiation Exposure Index (as provided by the fluoroscopic device): [id number] mGy Kerma COMPLICATIONS: None immediate. PROCEDURE: Informed consent was obtained from the patient following explanation of the procedure, risks, benefits and alternatives. The patient understands, agrees and consents for the procedure. All questions were addressed. A time out was performed prior to the initiation of the procedure. Maximal barrier sterile technique utilized including caps, mask, sterile gowns, sterile gloves, large sterile drape, hand hygiene, and chlorhexidine prep. During the procedure local anesthesia was provided with 1% lidocaine. Ultrasound was used to confirm patency of the right common femoral artery. A permanent ultrasound image was saved and recorded. The right common femoral artery was accessed utilizing a micropuncture set. A 5 French sheath was placed. A 5 French Cobra catheter was advanced into the abdominal aorta. The catheter was used to selectively catheterize the celiac axis. The catheter was then used to selectively catheterize the proximal splenic artery. Selective splenic arteriography was performed. A microcatheter was advanced through the 5 French catheter and further advanced into the distal aspect of the splenic artery. Additional selective splenic arteriography was performed. Embolization was performed through the microcatheter with initial deployment of a Medtronic MVP 5 vascular plug. Additional arteriography was performed through the microcatheter as well as periodic injection of contrast material under fluoroscopy to assess flow in the splenic artery. The microcatheter was removed. The 5 French catheter was then further advanced into the splenic artery.  Additional arteriography was performed. An MVP 7 vascular plug was then deployed through the 5 [place] catheter. Additional arteriography was then performed through the 5 French catheter. The catheter was then removed. Hemostasis was obtained at the sheath access site with the Angio-Seal device. FINDINGS: Initial splenic arteriography demonstrates a tortuous splenic artery supplying an enlarged spleen. Intra-splenic branches are beaded and tortuous especially in the lower pole of the spleen. There is some displacement of vessels in the central aspect of the splenic parenchyma likely related to intraparenchymal hemorrhage. No evidence of overt contrast extravasation or arterial pseudoaneurysm. Additional selective arteriography was performed near the splenic hilum in order to define bifurcation point of the main splenic artery. After placement of a single vascular plug, there was still flow noted around the plug and eventually the plug migrated in the artery slightly towards the splenic hilum. A second, larger vascular plug was therefore deployed in the distal splenic artery with significant reduction in flow and adequate reduce flow noted. IMPRESSION: Successful embolization of the splenic artery with placement of two separate vascular plugs. Electronically Signed   By: [name] M.D.   On: 10/31/2022 16:43   CT Angio Abd/Pel w/ and/or w/o  Result Date: 10/30/2022 CLINICAL DATA:  Splenomegaly, history of pancreatitis, abdominal pain, evidence of hemorrhage on previous CT EXAM: CTA ABDOMEN AND PELVIS WITHOUT AND WITH CONTRAST TECHNIQUE: Multidetector CT imaging of the abdomen and pelvis was performed using the standard protocol during bolus administration of intravenous contrast. Multiplanar reconstructed images and MIPs were obtained and reviewed to evaluate the vascular anatomy. RADIATION DOSE REDUCTION: This exam was performed according to the departmental dose-optimization program which includes  automated exposure control, adjustment of the mA and/or kV according to patient size and/or use of iterative reconstruction technique. CONTRAST:  76m OMNIPAQUE IOHEXOL 350 MG/ML SOLN COMPARISON:  10/30/2022, 10/20/2022, 10/19/2022 FINDINGS: VASCULAR Aorta: Normal caliber aorta without aneurysm, dissection, vasculitis or significant stenosis. Atherosclerosis. Celiac: Patent without evidence of aneurysm, dissection, vasculitis or significant stenosis. SMA: Patent without evidence of aneurysm, dissection,  vasculitis or significant stenosis. Renals: Both renal arteries are patent without evidence of aneurysm, dissection, vasculitis, fibromuscular dysplasia or significant stenosis. IMA: Patent without evidence of aneurysm, dissection, vasculitis or significant stenosis. Inflow: Patent without evidence of aneurysm, dissection, vasculitis or significant stenosis. Proximal Outflow: Bilateral common femoral and visualized portions of the superficial and profunda femoral arteries are patent without evidence of aneurysm, dissection, vasculitis or significant stenosis. Veins: There is mild extrinsic compression upon the main portal vein due to the inflammatory changes of the pancreas. 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 Remaining venous structures are unremarkable. Review of the MIP images confirms the above findings. NON-VASCULAR Lower chest: Trace left pleural effusion. No acute airspace disease. Hepatobiliary: Stable intrahepatic and extrahepatic biliary duct dilation, with common bile duct measuring up to 11 mm. There is narrowing of the downstream common bile duct in the region of the pancreatic head. Minimal gallbladder sludge without evidence of cholecystitis. Stable benign hepatic cysts. Pancreas: Stable parenchymal calcifications within the head and body of the pancreas. There is decreased parenchymal  enhancement within the region of the pancreatic head, consistent with edema and pancreatitis. Marked peripancreatic inflammatory change. There is a complex cystic structure adjacent to the tail the pancreas, measuring 4.1 x 2.6 cm, compatible with the hemorrhagic pseudocyst seen on previous imaging. No evidence of contrast extravasation to suggest active hemorrhage. Spleen: The spleen remains enlarged and heterogeneous, measuring approximately 10.0 x 7.7 by 10.5 cm in size. The diffuse heterogeneity of the splenic parenchyma seen previously is again noted, consistent with intracapsular hemorrhage. There is no evidence of contrast extravasation to suggest active bleeding. While no distinct capsular defect is identified, significant perisplenic fat stranding in the left upper quadrant as well as the high attenuation fluid throughout the abdomen and pelvis is concerning for splenic rupture. Adrenals/Urinary Tract: Stable right renal atrophy. No urinary tract calculi or obstruction. Bladder is unremarkable. The adrenals are normal. Stomach/Bowel: No bowel obstruction or ileus. No bowel wall thickening or inflammatory change. Lymphatic: No pathologic adenopathy within the abdomen or pelvis. Reproductive: Stable appearance of the prostate. Other: High attenuation ascites throughout the abdomen and pelvis is unchanged, consistent with hemoperitoneum. No free intraperitoneal gas. No abdominal wall hernia. Musculoskeletal: No acute or destructive bony lesions. Reconstructed images demonstrate no additional findings. IMPRESSION: VASCULAR 1. No evidence of contrast extravasation to suggest active intra-abdominal or intrapelvic hemorrhage. 2. 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 3.  Aortic Atherosclerosis (ICD10-I70.0). NON-VASCULAR 1. Stable heterogeneous enlargement of  the spleen, with perisplenic fat stranding and high attenuation fluid throughout the abdomen and pelvis consistent with intra splenic hemorrhage and likely capsular rupture. 2. Stable changes of pancreatitis, with heterogeneous fluid collection at the pancreatic tail consistent with hemorrhagic pseudocyst. 3. Stable high attenuation free fluid throughout the abdomen and pelvis compatible with hemoperitoneum. 4. Stable intrahepatic and extrahepatic biliary duct dilation, with narrowing of the downstream common bile duct at the level of the pancreatic head. Please see recent MRI evaluation. 5. Gallbladder sludge without cholelithiasis or cholecystitis. 6. Trace left pleural effusion. Electronically Signed   By: Randa Ngo M.D.   On: 10/30/2022 22:37   CT ABDOMEN PELVIS WO CONTRAST  Result Date: 10/30/2022 CLINICAL DATA:  Abdominal pain and syncope, history of pancreatitis, initial encounter EXAM: CT ABDOMEN  AND PELVIS WITHOUT CONTRAST TECHNIQUE: Multidetector CT imaging of the abdomen and pelvis was performed following the standard protocol without IV contrast. RADIATION DOSE REDUCTION: This exam was performed according to the departmental dose-optimization program which includes automated exposure control, adjustment of the mA and/or kV according to patient size and/or use of iterative reconstruction technique. COMPARISON:  10/19/2022 FINDINGS: Lower chest: Lung bases are free of acute infiltrate or sizable effusion. Cardiac blood pool is decreased in attenuation consistent with underlying anemia. Hepatobiliary: Gallbladder is within normal limits. Liver shows no focal abnormality. Very mild Peri hepatic fluid is noted. Pancreas: Diffuse calcifications are noted in the head and uncinate process and to a lesser degree within the body of the pancreas consistent with chronic pancreatitis. Superimposed inflammatory change in the pancreas is noted consistent with acute on chronic pancreatitis. Persistent cystic  lesion is noted within the tail of the pancreas again measuring approximately 2.4 cm. This however demonstrates increased density when compared with the recent CT likely related to some underlying hemorrhage within. Spleen: The spleen is significantly enlarged when compare with the prior exam. The previously seen perisplenic fluid collection is not well visualized. These changes are likely related to interval splenic hemorrhage. These changes would correspond with the patient's given clinical history of increased pain. Adrenals/Urinary Tract: Adrenal glands are within normal limits. Kidneys demonstrate no renal calculi or obstructive changes. Simple cyst is noted in the upper pole of the right kidney. No further follow-up is recommended. The bladder is decompressed. Stomach/Bowel: The appendix is not well visualized. No inflammatory changes to suggest appendicitis are noted. Small bowel and stomach appear within normal limits. Vascular/Lymphatic: Aortic atherosclerosis. No enlarged abdominal or pelvic lymph nodes. Reproductive: Pancreas appears within normal limits. Other: Considerable free fluid is noted which is of greater density than that expected for free fluid and with what appears to be a hematocrit level deep in the pelvis. These changes are consistent with intra-abdominal hemorrhage. Musculoskeletal: Degenerative changes of lumbar spine are noted. No acute bony abnormality is seen. IMPRESSION: Changes consistent with persistent acute pancreatitis. There has been interval hemorrhage within the cystic lesion within the tail the pancreas when compared with the prior CT examination. The spleen is now significantly enlarged and the splenic parenchyma is not well delineated. Given the perisplenic fluid seen previously and increase in free abdominal fluid with hematocrit, these changes are felt to be related to splenic hemorrhage and likely rupture into the peritoneal cavity. Changes of underlying anemia with  decreased density of the cardiac blood pool. Critical Value/emergent results were called by telephone at the time of interpretation on 10/30/2022 at 8:28 pm to Dr. Sherwood Gambler , who verbally acknowledged these results. Electronically Signed   By: Inez Catalina M.D.   On: 10/30/2022 20:37   DG Chest Portable 1 View  Result Date: 10/30/2022 CLINICAL DATA:  Syncope.  Nausea.  Vomiting. EXAM: PORTABLE CHEST 1 VIEW COMPARISON:  CXR 10/19/22 FINDINGS: No pleural effusion. No pneumothorax. No focal airspace opacity. Normal cardiac and mediastinal contours. No radiographically apparent displaced rib fractures. Visualized upper abdomen is unremarkable. IMPRESSION: No acute cardiopulmonary disease. Electronically Signed   By: Marin Roberts M.D.   On: 10/30/2022 18:22    Labs:  CBC: Recent Labs    10/30/22 1819 10/31/22 0516 10/31/22 1535 11/01/22 0616  WBC 19.4* 18.5* 16.8* 15.9*  HGB 6.1* 6.5* 8.9* 8.4*  HCT 18.2* 18.9* 26.2* 25.1*  PLT 354 256 248 217    COAGS: Recent Labs    10/30/22  2311  INR 1.2    BMP: Recent Labs    10/23/22 0032 10/30/22 1819 10/31/22 1535 11/01/22 0616  NA 131* 130* 135 135  K 3.2* 4.6 3.9 3.8  CL 102 103 108 101  CO2 23 18* 22 24  GLUCOSE 110* 142* 113* 103*  BUN 8 29* 29* 23  CALCIUM 7.8* 7.5* 7.6* 7.8*  CREATININE 0.85 2.27* 1.49* 1.22  GFRNONAA >60 30* 50* >60    LIVER FUNCTION TESTS: Recent Labs    10/23/22 0032 10/30/22 1819 10/31/22 1535 11/01/22 0616  BILITOT 3.7* 4.1* 3.1* 3.1*  AST 46* 55* 24 18  ALT 36 59* 38 27  ALKPHOS 137* 164* 139* 113  PROT 5.4* 6.5 6.2* 5.5*  ALBUMIN 1.8* 2.4* 2.1* 2.0*    Assessment and Plan:  72 y.o. male with chronic pancreatitis, splenic rupture, Luq pain and symptomatic anemia s/p main splenic artery embolization by Dr. Kathlene Cote on 10/31/22.   VSS Hgb stable, 8.4 RF wnl R CFA puncture site with TTP and firmness w/o thrill, R DP 2+  Duplex study ordered to eval possible right femoral artery  pseudoaneurysm, spoke with vascular US tech and both of them are at [facility] currently, but the study will be done today.   CT pending for 2 pm, asked CT tech to include right femoral artery.  Further treatment plan per PCCM/surgery Appreciate and agree with the plan.  IR will continue to follow.    Electronically Signed: Tera Mater, PA-C 11/01/2022, 10:10 AM   I spent a total of 15 Minutes at the the patient's bedside AND on the patient's hospital floor or unit, greater than 50% of which was counseling/coordinating care for splenic rupture, embolization follow up.   This chart was dictated using voice recognition software.  Despite best efforts to proofread,  errors can occur which can change the documentation meaning.

## 2022-11-02 ENCOUNTER — Inpatient Hospital Stay (HOSPITAL_COMMUNITY): Payer: Medicare PPO

## 2022-11-02 DIAGNOSIS — D735 Infarction of spleen: Secondary | ICD-10-CM | POA: Diagnosis not present

## 2022-11-02 LAB — COMPREHENSIVE METABOLIC PANEL
ALT: 21 U/L (ref 0–44)
AST: 17 U/L (ref 15–41)
Albumin: 1.9 g/dL — ABNORMAL LOW (ref 3.5–5.0)
Alkaline Phosphatase: 101 U/L (ref 38–126)
Anion gap: 9 (ref 5–15)
BUN: 21 mg/dL (ref 8–23)
CO2: 23 mmol/L (ref 22–32)
Calcium: 8 mg/dL — ABNORMAL LOW (ref 8.9–10.3)
Chloride: 99 mmol/L (ref 98–111)
Creatinine, Ser: 0.97 mg/dL (ref 0.61–1.24)
GFR, Estimated: >60 mL/min (ref >60)
Glucose, Bld: 100 mg/dL — ABNORMAL HIGH (ref 70–99)
Potassium: 3.7 mmol/L (ref 3.5–5.1)
Sodium: 131 mmol/L — ABNORMAL LOW (ref 135–145)
Total Bilirubin: 2.9 mg/dL — ABNORMAL HIGH (ref 0.3–1.2)
Total Protein: 5.6 g/dL — ABNORMAL LOW (ref 6.5–8.1)

## 2022-11-02 LAB — LIPASE, BLOOD: Lipase: 167 U/L — ABNORMAL HIGH (ref 11–51)

## 2022-11-02 LAB — CBC
HCT: 24.7 % — ABNORMAL LOW (ref 39.0–52.0)
Hemoglobin: 8.1 g/dL — ABNORMAL LOW (ref 13.0–17.0)
MCH: 31.5 pg (ref 26.0–34.0)
MCHC: 32.8 g/dL (ref 30.0–36.0)
MCV: 96.1 fL (ref 80.0–100.0)
Platelets: 245 10*3/uL (ref 150–400)
RBC: 2.57 MIL/uL — ABNORMAL LOW (ref 4.22–5.81)
RDW: 18.6 % — ABNORMAL HIGH (ref 11.5–15.5)
WBC: 14 10*3/uL — ABNORMAL HIGH (ref 4.0–10.5)
nRBC: 0.1 % (ref 0.0–0.2)

## 2022-11-02 LAB — GLUCOSE, CAPILLARY
Glucose-Capillary: 92 mg/dL (ref 70–99)
Glucose-Capillary: 135 mg/dL — ABNORMAL HIGH (ref 70–99)
Glucose-Capillary: 140 mg/dL — ABNORMAL HIGH (ref 70–99)

## 2022-11-02 MED ORDER — SODIUM CHLORIDE 0.9 % IV SOLN: INTRAVENOUS | Status: DC

## 2022-11-02 NOTE — Progress Notes (Signed)
PROGRESS NOTE  Jared Duran  [id number] DOB: 11-07-1950 DOA: 10/30/2022 PCP: [name], MD   Brief Narrative: Patient is a 72 year old male with history of chronic pancreatitis related to alcohol, recently admitted for acute pancreatitis discharge last week presented back to the emergency department after syncopal episode. He was complaining of persistent left upper quadrant pain for couple of days, went to the bathroom, felt lightheaded and passed out.On presentation his blood pressure was soft, hemoglobin was 6.1 which dropped from 7.5 last week. Lab work showed creatinine of 2.2. CT imaging showed features concerning for splenic hemorrhage with no active exacerbation. General surgery, GI, IR consulted.  Transfused with 2 units of PRBC.  Underwent splenic arteriography and embolization.  Hospital course remarkable for persistent abdominal pain now getting better.  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  Underwent splenic arteriography and embolization.  Hospital course remarkable for persistent abdominal pain now feeling better. General surgery is closely following ,no plan for operative intervention for now. CT abdomen/pelvis done on 2/24 for follow-up showed stable complex splenic laceration and perisplenic hematoma. Stable mild-to-moderate hemoperitoneum.   Chronic pancreatitis: History of alcohol abuse.  Just discharged  from here about a week ago.  Elevated lipase with mild elevated liver enzymes.  Imaging showed  moderate to severe chronic pancreatitis.  Bilirubin elevated to up to 4.  Abdomen remains slightly distended, bowel sounds present.  Abdominal pain is better today.  Continue clear liquid diet.  Mild elevated lipase  Left-sided pleural effusion/acute hypoxic respiratory failure: Was requiring 4 L of oxygen on 2/24, now on room air.  CT abdomen/pelvis showed moderate pleural effusion on the left side, will attempt thoracentesis.  Acute blood loss anemia:-Transfused with 2 units of PRBC, currently hemoglobin stable in the range of 8  AKI/hyponatremia: Most likely secondary to volume loss from bleeding, hypoperfusion/hypotension.  Resolved.  IV fluids discontinued  Leukocytosis: Could be reactive.  Started on ceftriaxone 2 g empirically given history of splenic hemorrhage/immunocompromised status.  Improving       DVT prophylaxis:SCDs Start: 10/31/22 0244     Code Status: Full Code  Family Communication: Discussed with the daughter  at bedside on 2/25  Patient status:Inpatient  Patient is from :Home  Anticipated discharge NE:[id number]  Estimated DC date:not sure   Consultants: General surgery, IR  Procedures: Splenic artery embolization  Antimicrobials:  Anti-infectives (From admission, onward)    Start     Dose/Rate Route Frequency Ordered Stop   11/01/22 0900  cefTRIAXone (ROCEPHIN) 2 g in sodium chloride 0.9 % 100 mL IVPB        2 g 200 mL/hr over 30 Minutes Intravenous Every 24 hours 11/01/22 0750     10/31/22 1100  ceFAZolin (ANCEF) IVPB 2g/100 mL premix        2 g 200 mL/hr over 30 Minutes Intravenous  Once 10/31/22 1059 10/31/22 1230       Subjective: Patient seen and  examined at bedside today.  Hemodynamically stable.  He looks better today.  Abdomen pain is better.  Tolerating clear liquid diet.  On room air.  Objective: Vitals:   11/02/22 0500 11/02/22 0600 11/02/22  0700 11/02/22 0841  BP: 105/75 (!) 115/50 115/63   Pulse: 75 76 87   Resp: 17 19 (!) 21   Temp:    97.8 F (36.6 C)  TempSrc:    Oral  SpO2: 99% 99% 90%   Weight:  58.4 kg    Height:        Intake/Output Summary (Last 24 hours) at 11/02/2022 1005 Last data filed at 11/02/2022 0600 Gross per 24 hour  Intake 1661.23 ml  Output 1700 ml  Net -38.77 ml   Filed Weights   10/30/22 1751 10/31/22 1300 11/02/22 0600  Weight: 61.2 kg 55.5 kg 58.4 kg    Examination:   General exam: Overall comfortable, not in distress HEENT: PERRL Respiratory system:  no wheezes or crackles , mild diminished sounds on the left side Cardiovascular system: S1 & S2 heard, RRR.  Gastrointestinal system: Abdomen is slightly distended, diffuse mild tenderness  Central nervous system: Alert and oriented Extremities: No edema, no clubbing ,no cyanosis Skin: No rashes, no ulcers,no icterus     Data Reviewed: I have personally reviewed following labs and imaging studies  CBC: Recent Labs  Lab 10/30/22 1819 10/31/22 0516 10/31/22 1535 11/01/22 0616 11/01/22 1630 11/02/22 0643  WBC 19.4* 18.5* 16.8* 15.9* 15.2* 14.0*  NEUTROABS 15.8*  --   --   --   --   --   HGB 6.1* 6.5* 8.9* 8.4* 8.0* 8.1*  HCT 18.2* 18.9* 26.2* 25.1* 24.2* 24.7*  MCV 100.6* 95.5 91.9 93.7 95.3 96.1  PLT 354 256 248 217 213 [id number]   Basic Metabolic Panel: Recent Labs  Lab 10/30/22 1819 10/31/22 1535 11/01/22 0616 11/02/22 0643  NA 130* 135 135 131*  K 4.6 3.9 3.8 3.7  CL 103 108 101 99  CO2 18* '22 24 23  '$ GLUCOSE 142* 113* 103* 100*  BUN 29* 29* 23 21  CREATININE 2.27* 1.49* 1.22 0.97  CALCIUM 7.5* 7.6* 7.8* 8.0*     Recent Results (from the past 240 hour(s))  MRSA Next Gen by PCR, Nasal     Status: None   Collection Time: 10/31/22  1:00 PM   Specimen: Nasal Mucosa; Nasal Swab  Result Value Ref Range Status   MRSA by PCR Next Gen NOT DETECTED NOT DETECTED Final    Comment: (NOTE) The GeneXpert MRSA Assay (FDA  approved for NASAL specimens only), is one component of a comprehensive MRSA colonization surveillance program. It is not intended to diagnose MRSA infection nor to guide or monitor treatment for MRSA infections. Test performance is not FDA approved in patients less than 34 years old. Performed at [facility], [place] [address], [place],  [address]      Radiology Studies: CT ABDOMEN PELVIS W CONTRAST  Result Date: 11/01/2022 CLINICAL DATA:  Worsening abdominal pain. Splenic hemorrhage. Status post splenic artery embolization. Pancreatitis. EXAM: CT ABDOMEN AND PELVIS WITH CONTRAST TECHNIQUE: Multidetector CT imaging of the abdomen and pelvis was performed using the standard protocol following bolus administration of intravenous contrast. RADIATION DOSE REDUCTION: This exam was performed according to the departmental dose-optimization program which includes automated exposure control, adjustment of the mA and/or kV according to patient size and/or use of iterative reconstruction technique. CONTRAST:  143m OMNIPAQUE IOHEXOL 300 MG/ML  SOLN COMPARISON:  10/30/2022  FINDINGS: Lower Chest: Increased moderate left pleural effusion and left lower lobe atelectasis. New tiny right pleural effusion. Hepatobiliary: Few small hepatic cysts remains stable. No hepatic masses identified. 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 High attenuation gallbladder sludge again noted, however there is no evidence of acute cholecystitis. Pancreas: No pancreatic head mass identified. Diffuse pancreatic calcification is again seen, consistent with chronic pancreatitis. A 3.6 cm complex cystic lesion is seen adjacent to the pancreatic tail, which may represent pancreatic pseudocyst or hematoma. 1.0 cm fluid collection adjacent to the pancreatic neck is consistent with a small pseudocyst. Spleen: Complex splenic laceration and perisplenic  hematoma are stable since prior study. Stable mild-to-moderate hemoperitoneum. Adrenals/Urinary Tract: No suspicious masses identified. No evidence of ureteral calculi or hydronephrosis. Stomach/Bowel: No evidence of obstruction, inflammatory process or abnormal fluid collections. Normal appendix visualized. Diverticulosis is seen mainly involving the descending and sigmoid colon, however there is no evidence of diverticulitis. Vascular/Lymphatic: No pathologically enlarged lymph nodes. No acute vascular findings. Aortic atherosclerotic calcification incidentally noted. Reproductive:  No mass or other significant abnormality. Other:  Increased diffuse mesenteric and body wall edema. Musculoskeletal:  No suspicious bone lesions identified. IMPRESSION: Stable complex splenic laceration and perisplenic hematoma. Stable mild-to-moderate hemoperitoneum. Stable 3.6 cm complex cystic lesion adjacent to the pancreatic tail, which may represent pancreatic pseudocyst or hematoma. Chronic calcific pancreatitis. 1 cm cystic lesion adjacent to the pancreatic neck, consistent with small pseudocyst. 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 Increased diffuse mesenteric and body wall edema. Increased moderate left pleural effusion and left lower lobe atelectasis. New tiny right pleural effusion. Colonic diverticulosis, without radiographic evidence of diverticulitis. Aortic Atherosclerosis (ICD10-I70.0). Electronically Signed   By: Marlaine Hind M.D.   On: 11/01/2022 15:04   IR US Guide Vasc Access Right  Result Date: 10/31/2022 INDICATION: Pancreatitis and spontaneous splenic hemorrhage with parenchymal subcapsular hematoma. Hemoglobin has not responded appropriately to blood transfusion and the patient now presents for splenic artery embolization. EXAM: 1. ULTRASOUND GUIDANCE FOR VASCULAR ACCESS OF THE RIGHT COMMON FEMORAL ARTERY 2. VISCERAL SELECTIVE  ARTERIOGRAPHY OF THE SPLENIC ARTERY 3. ADDITIONAL SELECTIVE ARTERIOGRAPHY OF THE DISTAL SPLENIC ARTERY 4. TRANSCATHETER EMBOLIZATION OF THE SPLENIC ARTERY TO TREAT ARTERIAL HEMORRHAGE MEDICATIONS: 2 g IV Ancef. The antibiotic was administered within 1 hour of the procedure ANESTHESIA/SEDATION: Moderate (conscious) sedation was employed during this procedure. A total of Versed 2.0 mg and Fentanyl 100 mcg was administered intravenously. Moderate Sedation Time: 55 minutes. The patient's level of consciousness and vital signs were monitored continuously by radiology nursing throughout the procedure under my direct supervision. CONTRAST:  25 mL Visipaque 320 FLUOROSCOPY TIME:  Radiation Exposure Index (as provided by the fluoroscopic device): [id number] mGy Kerma COMPLICATIONS: None immediate. PROCEDURE: Informed consent was obtained from the patient following explanation of the procedure, risks, benefits and alternatives. The patient understands, agrees and consents for the procedure. All questions were addressed. A time out was performed prior to the initiation of the procedure. Maximal barrier sterile technique utilized including caps, mask, sterile gowns, sterile gloves, large sterile drape, hand hygiene, and chlorhexidine prep. During the procedure local anesthesia was provided with 1% lidocaine. Ultrasound was used to confirm patency of the right common femoral artery. A permanent ultrasound image was saved and recorded. The right common femoral artery was accessed utilizing a micropuncture set. A 5 French sheath was placed. A 5 French Cobra catheter was advanced into the  abdominal aorta. The catheter was used to selectively catheterize the celiac axis. The catheter was then used to selectively catheterize the proximal splenic artery. Selective splenic arteriography was performed. A microcatheter was advanced through the 5 French catheter and further advanced into the distal aspect of the splenic artery. Additional  selective splenic arteriography was performed. Embolization was performed through the microcatheter with initial deployment of a Medtronic MVP 5 vascular plug. Additional arteriography was performed through the microcatheter as well as periodic injection of contrast material under fluoroscopy to assess flow in the splenic artery. The microcatheter was removed. The 5 French catheter was then further advanced into the splenic artery. Additional arteriography was performed. An MVP 7 vascular plug was then deployed through the 5 [place] catheter. Additional arteriography was then performed through the 5 French catheter. The catheter was then removed. Hemostasis was obtained at the sheath access site with the Angio-Seal device. FINDINGS: Initial splenic arteriography demonstrates a tortuous splenic artery supplying an enlarged spleen. Intra-splenic branches are beaded and tortuous especially in the lower pole of the spleen. There is some displacement of vessels in the central aspect of the splenic parenchyma likely related to intraparenchymal hemorrhage. No evidence of overt contrast extravasation or arterial pseudoaneurysm. Additional selective arteriography was performed near the splenic hilum in order to define bifurcation point of the main splenic artery. After placement of a single vascular plug, there was still flow noted around the plug and eventually the plug migrated in the artery slightly towards the splenic hilum. A second, larger vascular plug was therefore deployed in the distal splenic artery with significant reduction in flow and adequate reduce flow noted. IMPRESSION: Successful embolization of the splenic artery with placement of two separate vascular plugs. Electronically Signed   By: [name] M.D.   On: 10/31/2022 16:43   IR Angiogram Visceral Selective  Result Date: 10/31/2022 INDICATION: Pancreatitis and spontaneous splenic hemorrhage with parenchymal subcapsular hematoma. Hemoglobin has  not responded appropriately to blood transfusion and the patient now presents for splenic artery embolization. EXAM: 1. ULTRASOUND GUIDANCE FOR VASCULAR ACCESS OF THE RIGHT COMMON FEMORAL ARTERY 2. VISCERAL SELECTIVE ARTERIOGRAPHY OF THE SPLENIC ARTERY 3. ADDITIONAL SELECTIVE ARTERIOGRAPHY OF THE DISTAL SPLENIC ARTERY 4. TRANSCATHETER EMBOLIZATION OF THE SPLENIC ARTERY TO TREAT ARTERIAL HEMORRHAGE MEDICATIONS: 2 g IV Ancef. The antibiotic was administered within 1 hour of the procedure ANESTHESIA/SEDATION: Moderate (conscious) sedation was employed during this procedure. A total of Versed 2.0 mg and Fentanyl 100 mcg was administered intravenously. Moderate Sedation Time: 55 minutes. The patient's level of consciousness and vital signs were monitored continuously by radiology nursing throughout the procedure under my direct supervision. CONTRAST:  25 mL Visipaque 320 FLUOROSCOPY TIME:  Radiation Exposure Index (as provided by the fluoroscopic device): [id number] mGy Kerma COMPLICATIONS: None immediate. PROCEDURE: Informed consent was obtained from the patient following explanation of the procedure, risks, benefits and alternatives. The patient understands, agrees and consents for the procedure. All questions were addressed. A time out was performed prior to the initiation of the procedure. Maximal barrier sterile technique utilized including caps, mask, sterile gowns, sterile gloves, large sterile drape, hand hygiene, and chlorhexidine prep. During the procedure local anesthesia was provided with 1% lidocaine. Ultrasound was used to confirm patency of the right common femoral artery. A permanent ultrasound image was saved and recorded. The right common femoral artery was accessed utilizing a micropuncture set. A 5 French sheath was placed. A 5 French Cobra catheter was advanced into the abdominal aorta. The catheter was used  to selectively catheterize the celiac axis. The catheter was then used to selectively catheterize  the proximal splenic artery. Selective splenic arteriography was performed. A microcatheter was advanced through the 5 French catheter and further advanced into the distal aspect of the splenic artery. Additional selective splenic arteriography was performed. Embolization was performed through the microcatheter with initial deployment of a Medtronic MVP 5 vascular plug. Additional arteriography was performed through the microcatheter as well as periodic injection of contrast material under fluoroscopy to assess flow in the splenic artery. The microcatheter was removed. The 5 French catheter was then further advanced into the splenic artery. Additional arteriography was performed. An MVP 7 vascular plug was then deployed through the 5 [place] catheter. Additional arteriography was then performed through the 5 French catheter. The catheter was then removed. Hemostasis was obtained at the sheath access site with the Angio-Seal device. FINDINGS: Initial splenic arteriography demonstrates a tortuous splenic artery supplying an enlarged spleen. Intra-splenic branches are beaded and tortuous especially in the lower pole of the spleen. There is some displacement of vessels in the central aspect of the splenic parenchyma likely related to intraparenchymal hemorrhage. No evidence of overt contrast extravasation or arterial pseudoaneurysm. Additional selective arteriography was performed near the splenic hilum in order to define bifurcation point of the main splenic artery. After placement of a single vascular plug, there was still flow noted around the plug and eventually the plug migrated in the artery slightly towards the splenic hilum. A second, larger vascular plug was therefore deployed in the distal splenic artery with significant reduction in flow and adequate reduce flow noted. IMPRESSION: Successful embolization of the splenic artery with placement of two separate vascular plugs. Electronically Signed   By: [name] M.D.   On: 10/31/2022 16:43   IR Angiogram Selective Each Additional Vessel  Result Date: 10/31/2022 INDICATION: Pancreatitis and spontaneous splenic hemorrhage with parenchymal subcapsular hematoma. Hemoglobin has not responded appropriately to blood transfusion and the patient now presents for splenic artery embolization. EXAM: 1. ULTRASOUND GUIDANCE FOR VASCULAR ACCESS OF THE RIGHT COMMON FEMORAL ARTERY 2. VISCERAL SELECTIVE ARTERIOGRAPHY OF THE SPLENIC ARTERY 3. ADDITIONAL SELECTIVE ARTERIOGRAPHY OF THE DISTAL SPLENIC ARTERY 4. TRANSCATHETER EMBOLIZATION OF THE SPLENIC ARTERY TO TREAT ARTERIAL HEMORRHAGE MEDICATIONS: 2 g IV Ancef. The antibiotic was administered within 1 hour of the procedure ANESTHESIA/SEDATION: Moderate (conscious) sedation was employed during this procedure. A total of Versed 2.0 mg and Fentanyl 100 mcg was administered intravenously. Moderate Sedation Time: 55 minutes. The patient's level of consciousness and vital signs were monitored continuously by radiology nursing throughout the procedure under my direct supervision. CONTRAST:  25 mL Visipaque 320 FLUOROSCOPY TIME:  Radiation Exposure Index (as provided by the fluoroscopic device): [id number] mGy Kerma COMPLICATIONS: None immediate. PROCEDURE: Informed consent was obtained from the patient following explanation of the procedure, risks, benefits and alternatives. The patient understands, agrees and consents for the procedure. All questions were addressed. A time out was performed prior to the initiation of the procedure. Maximal barrier sterile technique utilized including caps, mask, sterile gowns, sterile gloves, large sterile drape, hand hygiene, and chlorhexidine prep. During the procedure local anesthesia was provided with 1% lidocaine. Ultrasound was used to confirm patency of the right common femoral artery. A permanent ultrasound image was saved and recorded. The right common femoral artery was accessed utilizing a  micropuncture set. A 5 French sheath was placed. A 5 French Cobra catheter was advanced into the abdominal aorta. The catheter was used to selectively catheterize  the celiac axis. The catheter was then used to selectively catheterize the proximal splenic artery. Selective splenic arteriography was performed. A microcatheter was advanced through the 5 French catheter and further advanced into the distal aspect of the splenic artery. Additional selective splenic arteriography was performed. Embolization was performed through the microcatheter with initial deployment of a Medtronic MVP 5 vascular plug. Additional arteriography was performed through the microcatheter as well as periodic injection of contrast material under fluoroscopy to assess flow in the splenic artery. The microcatheter was removed. The 5 French catheter was then further advanced into the splenic artery. Additional arteriography was performed. An MVP 7 vascular plug was then deployed through the 5 [place] catheter. Additional arteriography was then performed through the 5 French catheter. The catheter was then removed. Hemostasis was obtained at the sheath access site with the Angio-Seal device. FINDINGS: Initial splenic arteriography demonstrates a tortuous splenic artery supplying an enlarged spleen. Intra-splenic branches are beaded and tortuous especially in the lower pole of the spleen. There is some displacement of vessels in the central aspect of the splenic parenchyma likely related to intraparenchymal hemorrhage. No evidence of overt contrast extravasation or arterial pseudoaneurysm. Additional selective arteriography was performed near the splenic hilum in order to define bifurcation point of the main splenic artery. After placement of a single vascular plug, there was still flow noted around the plug and eventually the plug migrated in the artery slightly towards the splenic hilum. A second, larger vascular plug was therefore deployed in  the distal splenic artery with significant reduction in flow and adequate reduce flow noted. IMPRESSION: Successful embolization of the splenic artery with placement of two separate vascular plugs. Electronically Signed   By: [name] M.D.   On: 10/31/2022 16:43   IR EMBO ART  VEN HEMORR LYMPH EXTRAV  INC GUIDE ROADMAPPING  Result Date: 10/31/2022 INDICATION: Pancreatitis and spontaneous splenic hemorrhage with parenchymal subcapsular hematoma. Hemoglobin has not responded appropriately to blood transfusion and the patient now presents for splenic artery embolization. EXAM: 1. ULTRASOUND GUIDANCE FOR VASCULAR ACCESS OF THE RIGHT COMMON FEMORAL ARTERY 2. VISCERAL SELECTIVE ARTERIOGRAPHY OF THE SPLENIC ARTERY 3. ADDITIONAL SELECTIVE ARTERIOGRAPHY OF THE DISTAL SPLENIC ARTERY 4. TRANSCATHETER EMBOLIZATION OF THE SPLENIC ARTERY TO TREAT ARTERIAL HEMORRHAGE MEDICATIONS: 2 g IV Ancef. The antibiotic was administered within 1 hour of the procedure ANESTHESIA/SEDATION: Moderate (conscious) sedation was employed during this procedure. A total of Versed 2.0 mg and Fentanyl 100 mcg was administered intravenously. Moderate Sedation Time: 55 minutes. The patient's level of consciousness and vital signs were monitored continuously by radiology nursing throughout the procedure under my direct supervision. CONTRAST:  25 mL Visipaque 320 FLUOROSCOPY TIME:  Radiation Exposure Index (as provided by the fluoroscopic device): [id number] mGy Kerma COMPLICATIONS: None immediate. PROCEDURE: Informed consent was obtained from the patient following explanation of the procedure, risks, benefits and alternatives. The patient understands, agrees and consents for the procedure. All questions were addressed. A time out was performed prior to the initiation of the procedure. Maximal barrier sterile technique utilized including caps, mask, sterile gowns, sterile gloves, large sterile drape, hand hygiene, and chlorhexidine prep. During the  procedure local anesthesia was provided with 1% lidocaine. Ultrasound was used to confirm patency of the right common femoral artery. A permanent ultrasound image was saved and recorded. The right common femoral artery was accessed utilizing a micropuncture set. A 5 French sheath was placed. A 5 French Cobra catheter was advanced into the abdominal aorta. The catheter was used to  selectively catheterize the celiac axis. The catheter was then used to selectively catheterize the proximal splenic artery. Selective splenic arteriography was performed. A microcatheter was advanced through the 5 French catheter and further advanced into the distal aspect of the splenic artery. Additional selective splenic arteriography was performed. Embolization was performed through the microcatheter with initial deployment of a Medtronic MVP 5 vascular plug. Additional arteriography was performed through the microcatheter as well as periodic injection of contrast material under fluoroscopy to assess flow in the splenic artery. The microcatheter was removed. The 5 French catheter was then further advanced into the splenic artery. Additional arteriography was performed. An MVP 7 vascular plug was then deployed through the 5 [place] catheter. Additional arteriography was then performed through the 5 French catheter. The catheter was then removed. Hemostasis was obtained at the sheath access site with the Angio-Seal device. FINDINGS: Initial splenic arteriography demonstrates a tortuous splenic artery supplying an enlarged spleen. Intra-splenic branches are beaded and tortuous especially in the lower pole of the spleen. There is some displacement of vessels in the central aspect of the splenic parenchyma likely related to intraparenchymal hemorrhage. No evidence of overt contrast extravasation or arterial pseudoaneurysm. Additional selective arteriography was performed near the splenic hilum in order to define bifurcation point of the main  splenic artery. After placement of a single vascular plug, there was still flow noted around the plug and eventually the plug migrated in the artery slightly towards the splenic hilum. A second, larger vascular plug was therefore deployed in the distal splenic artery with significant reduction in flow and adequate reduce flow noted. IMPRESSION: Successful embolization of the splenic artery with placement of two separate vascular plugs. Electronically Signed   By: [name] M.D.   On: 10/31/2022 16:43    Scheduled Meds:  Chlorhexidine Gluconate Cloth  6 each Topical Q2000   lip balm   Topical BID   pantoprazole (PROTONIX) IV  40 mg Intravenous Q12H   polycarbophil  625 mg Oral BID   Continuous Infusions:  cefTRIAXone (ROCEPHIN)  IV Stopped (11/01/22 1051)   methocarbamol (ROBAXIN) IV     ondansetron (ZOFRAN) IV       LOS: 3 days   Shelly Coss, MD [facility] P2/25/2024, 10:05 AM

## 2022-11-02 NOTE — Progress Notes (Signed)
Subjective: Reports improvement in abdominal pain, currently 4 out of 10, denies nausea, wants his diet to be advanced to full liquids.  Objective: Vital signs in last 24 hours: Temp:  [97.4 F (36.3 C)-98.6 F (37 C)] 97.9 F (36.6 C) (02/25 1314) Pulse Rate:  [64-97] 80 (02/25 1200) Resp:  [8-22] 13 (02/25 1200) BP: (103-133)/(50-85) 103/55 (02/25 1200) SpO2:  [90 %-100 %] 99 % (02/25 1200) Weight:  [58.4 kg] 58.4 kg (02/25 0600) Weight change: 2.9 kg Last BM Date : 10/30/22  PE: Ill-appearing GENERAL: Mild pallor and mild icterus  ABDOMEN: Distended, softer than yesterday, less tender than yesterday, normoactive bowel sounds EXTREMITIES: No deformity  Lab Results: Results for orders placed or performed during the hospital encounter of 10/30/22 (from the past 48 hour(s))  CBC     Status: Abnormal   Collection Time: 10/31/22  3:35 PM  Result Value Ref Range   WBC 16.8 (H) 4.0 - 10.5 K/uL   RBC 2.85 (L) 4.22 - 5.81 MIL/uL   Hemoglobin 8.9 (L) 13.0 - 17.0 g/dL    Comment: POST TRANSFUSION SPECIMEN   HCT 26.2 (L) 39.0 - 52.0 %   MCV 91.9 80.0 - 100.0 fL   MCH 31.2 26.0 - 34.0 pg   MCHC 34.0 30.0 - 36.0 g/dL   RDW 18.6 (H) 11.5 - 15.5 %   Platelets 248 150 - 400 K/uL   nRBC 0.0 0.0 - 0.2 %    Comment: Performed at [facility], [place] [address], [place] [employer], [place] [address]  Comprehensive metabolic panel     Status: Abnormal   Collection Time: 10/31/22  3:35 PM  Result Value Ref Range   Sodium 135 135 - 145 mmol/L   Potassium 3.9 3.5 - 5.1 mmol/L   Chloride 108 98 - 111 mmol/L   CO2 22 22 - 32 mmol/L   Glucose, Bld 113 (H) 70 - 99 mg/dL    Comment: Glucose reference range applies only to samples taken after fasting for at least 8 hours.   BUN 29 (H) 8 - 23 mg/dL   Creatinine, Ser 1.49 (H) 0.61 - 1.24 mg/dL   Calcium 7.6 (L) 8.9 - 10.3 mg/dL   Total Protein 6.2 (L) 6.5 - 8.1 g/dL   Albumin 2.1 (L) 3.5 - 5.0 g/dL   AST 24 15 - 41 U/L   ALT 38 0 - 44 U/L    Alkaline Phosphatase 139 (H) 38 - 126 U/L   Total Bilirubin 3.1 (H) 0.3 - 1.2 mg/dL   GFR, Estimated 50 (L) >60 mL/min    Comment: (NOTE) Calculated using the CKD-EPI Creatinine Equation (2021)    Anion gap 5 5 - 15    Comment: Performed at [facility], [place] [address] [place], [place] [address]  Glucose, capillary     Status: Abnormal   Collection Time: 10/31/22  5:49 PM  Result Value Ref Range   Glucose-Capillary 116 (H) 70 - 99 mg/dL    Comment: Glucose reference range applies only to samples taken after fasting for at least 8 hours.   Comment 1 Notify RN    Comment 2 Document in Chart   Glucose, capillary     Status: Abnormal   Collection Time: 10/31/22 11:25 PM  Result Value Ref Range   Glucose-Capillary 116 (H) 70 - 99 mg/dL    Comment: Glucose reference range applies only to samples taken after fasting for at least 8 hours.   Comment 1 Notify RN  Comment 2 Document in Chart   CBC     Status: Abnormal   Collection Time: 11/01/22  6:16 AM  Result Value Ref Range   WBC 15.9 (H) 4.0 - 10.5 K/uL   RBC 2.68 (L) 4.22 - 5.81 MIL/uL   Hemoglobin 8.4 (L) 13.0 - 17.0 g/dL   HCT 25.1 (L) 39.0 - 52.0 %   MCV 93.7 80.0 - 100.0 fL   MCH 31.3 26.0 - 34.0 pg   MCHC 33.5 30.0 - 36.0 g/dL   RDW 19.4 (H) 11.5 - 15.5 %   Platelets 217 150 - 400 K/uL   nRBC 0.1 0.0 - 0.2 %    Comment: Performed at [facility], [place] [address] [place], [place] [address]  Comprehensive metabolic panel     Status: Abnormal   Collection Time: 11/01/22  6:16 AM  Result Value Ref Range   Sodium 135 135 - 145 mmol/L   Potassium 3.8 3.5 - 5.1 mmol/L   Chloride 101 98 - 111 mmol/L   CO2 24 22 - 32 mmol/L   Glucose, Bld 103 (H) 70 - 99 mg/dL    Comment: Glucose reference range applies only to samples taken after fasting for at least 8 hours.   BUN 23 8 - 23 mg/dL   Creatinine, Ser 1.22 0.61 - 1.24 mg/dL   Calcium 7.8 (L) 8.9 - 10.3 mg/dL   Total Protein 5.5 (L) 6.5  - 8.1 g/dL   Albumin 2.0 (L) 3.5 - 5.0 g/dL   AST 18 15 - 41 U/L   ALT 27 0 - 44 U/L   Alkaline Phosphatase 113 38 - 126 U/L   Total Bilirubin 3.1 (H) 0.3 - 1.2 mg/dL   GFR, Estimated >60 >60 mL/min    Comment: (NOTE) Calculated using the CKD-EPI Creatinine Equation (2021)    Anion gap 10 5 - 15    Comment: Performed at [facility], [place] [address], [place], [place] [address]  Lipase, blood     Status: Abnormal   Collection Time: 11/01/22  6:16 AM  Result Value Ref Range   Lipase 120 (H) 11 - 51 U/L    Comment: Performed at [facility], [place] [address], [place], [place] [address]  Glucose, capillary     Status: Abnormal   Collection Time: 11/01/22  7:25 AM  Result Value Ref Range   Glucose-Capillary 109 (H) 70 - 99 mg/dL    Comment: Glucose reference range applies only to samples taken after fasting for at least 8 hours.   Comment 1 Notify RN    Comment 2 Document in Chart   Glucose, capillary     Status: None   Collection Time: 11/01/22 12:00 PM  Result Value Ref Range   Glucose-Capillary 85 70 - 99 mg/dL    Comment: Glucose reference range applies only to samples taken after fasting for at least 8 hours.  CBC     Status: Abnormal   Collection Time: 11/01/22  4:30 PM  Result Value Ref Range   WBC 15.2 (H) 4.0 - 10.5 K/uL   RBC 2.54 (L) 4.22 - 5.81 MIL/uL   Hemoglobin 8.0 (L) 13.0 - 17.0 g/dL   HCT 24.2 (L) 39.0 - 52.0 %   MCV 95.3 80.0 - 100.0 fL   MCH 31.5 26.0 - 34.0 pg   MCHC 33.1 30.0 - 36.0 g/dL   RDW 18.6 (H) 11.5 - 15.5 %   Platelets 213 150 - 400 K/uL  nRBC 0.0 0.0 - 0.2 %    Comment: Performed at [facility], [place] [address], [place], [place] [address]  Glucose, capillary     Status: Abnormal   Collection Time: 11/01/22  7:30 PM  Result Value Ref Range   Glucose-Capillary 112 (H) 70 - 99 mg/dL    Comment: Glucose reference range applies only to samples taken after fasting for at least 8 hours.   Glucose, capillary     Status: Abnormal   Collection Time: 11/01/22 11:35 PM  Result Value Ref Range   Glucose-Capillary 112 (H) 70 - 99 mg/dL    Comment: Glucose reference range applies only to samples taken after fasting for at least 8 hours.  Glucose, capillary     Status: None   Collection Time: 11/02/22  6:11 AM  Result Value Ref Range   Glucose-Capillary 92 70 - 99 mg/dL    Comment: Glucose reference range applies only to samples taken after fasting for at least 8 hours.  CBC     Status: Abnormal   Collection Time: 11/02/22  6:43 AM  Result Value Ref Range   WBC 14.0 (H) 4.0 - 10.5 K/uL   RBC 2.57 (L) 4.22 - 5.81 MIL/uL   Hemoglobin 8.1 (L) 13.0 - 17.0 g/dL   HCT 24.7 (L) 39.0 - 52.0 %   MCV 96.1 80.0 - 100.0 fL   MCH 31.5 26.0 - 34.0 pg   MCHC 32.8 30.0 - 36.0 g/dL   RDW 18.6 (H) 11.5 - 15.5 %   Platelets 245 150 - 400 K/uL   nRBC 0.1 0.0 - 0.2 %    Comment: Performed at [facility], [place] [address], [place], [place] [address]  Comprehensive metabolic panel     Status: Abnormal   Collection Time: 11/02/22  6:43 AM  Result Value Ref Range   Sodium 131 (L) 135 - 145 mmol/L   Potassium 3.7 3.5 - 5.1 mmol/L   Chloride 99 98 - 111 mmol/L   CO2 23 22 - 32 mmol/L   Glucose, Bld 100 (H) 70 - 99 mg/dL    Comment: Glucose reference range applies only to samples taken after fasting for at least 8 hours.   BUN 21 8 - 23 mg/dL   Creatinine, Ser 0.97 0.61 - 1.24 mg/dL   Calcium 8.0 (L) 8.9 - 10.3 mg/dL   Total Protein 5.6 (L) 6.5 - 8.1 g/dL   Albumin 1.9 (L) 3.5 - 5.0 g/dL   AST 17 15 - 41 U/L   ALT 21 0 - 44 U/L   Alkaline Phosphatase 101 38 - 126 U/L   Total Bilirubin 2.9 (H) 0.3 - 1.2 mg/dL   GFR, Estimated >60 >60 mL/min    Comment: (NOTE) Calculated using the CKD-EPI Creatinine Equation (2021)    Anion gap 9 5 - 15    Comment: Performed at [facility], [place] [address], [place], [place] [address]  Lipase, blood     Status: Abnormal    Collection Time: 11/02/22  6:43 AM  Result Value Ref Range   Lipase 167 (H) 11 - 51 U/L    Comment: Performed at [facility], [place] [address], [place], [place] [address]  Glucose, capillary     Status: Abnormal   Collection Time: 11/02/22 11:38 AM  Result Value Ref Range   Glucose-Capillary 135 (H) 70 - 99 mg/dL    Comment: Glucose reference range applies only to samples taken after fasting for at  least 8 hours.   Comment 1 Notify RN    Comment 2 Document in Chart     Studies/Results: CT ABDOMEN PELVIS W CONTRAST  Result Date: 11/01/2022 CLINICAL DATA:  Worsening abdominal pain. Splenic hemorrhage. Status post splenic artery embolization. Pancreatitis. EXAM: CT ABDOMEN AND PELVIS WITH CONTRAST TECHNIQUE: Multidetector CT imaging of the abdomen and pelvis was performed using the standard protocol following bolus administration of intravenous contrast. RADIATION DOSE REDUCTION: This exam was performed according to the departmental dose-optimization program which includes automated exposure control, adjustment of the mA and/or kV according to patient size and/or use of iterative reconstruction technique. CONTRAST:  139m OMNIPAQUE IOHEXOL 300 MG/ML  SOLN COMPARISON:  10/30/2022 FINDINGS: Lower Chest: Increased moderate left pleural effusion and left lower lobe atelectasis. New tiny right pleural effusion. Hepatobiliary: Few small hepatic cysts remains stable. No hepatic masses identified. 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 High attenuation gallbladder sludge again noted, however there is no evidence of acute cholecystitis. Pancreas: No pancreatic head mass identified. Diffuse pancreatic calcification is again seen, consistent with chronic pancreatitis. A 3.6 cm complex cystic lesion is seen adjacent to the pancreatic tail, which may represent pancreatic pseudocyst or hematoma. 1.0 cm fluid collection adjacent  to the pancreatic neck is consistent with a small pseudocyst. Spleen: Complex splenic laceration and perisplenic hematoma are stable since prior study. Stable mild-to-moderate hemoperitoneum. Adrenals/Urinary Tract: No suspicious masses identified. No evidence of ureteral calculi or hydronephrosis. Stomach/Bowel: No evidence of obstruction, inflammatory process or abnormal fluid collections. Normal appendix visualized. Diverticulosis is seen mainly involving the descending and sigmoid colon, however there is no evidence of diverticulitis. Vascular/Lymphatic: No pathologically enlarged lymph nodes. No acute vascular findings. Aortic atherosclerotic calcification incidentally noted. Reproductive:  No mass or other significant abnormality. Other:  Increased diffuse mesenteric and body wall edema. Musculoskeletal:  No suspicious bone lesions identified. IMPRESSION: Stable complex splenic laceration and perisplenic hematoma. Stable mild-to-moderate hemoperitoneum. Stable 3.6 cm complex cystic lesion adjacent to the pancreatic tail, which may represent pancreatic pseudocyst or hematoma. Chronic calcific pancreatitis. 1 cm cystic lesion adjacent to the pancreatic neck, consistent with small pseudocyst. 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 Increased diffuse mesenteric and body wall edema. Increased moderate left pleural effusion and left lower lobe atelectasis. New tiny right pleural effusion. Colonic diverticulosis, without radiographic evidence of diverticulitis. Aortic Atherosclerosis (ICD10-I70.0). Electronically Signed   By: JMarlaine HindM.D.   On: 11/01/2022 15:04    Medications: I have reviewed the patient's current medications.  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 bili 2.9/AST and ALT normal with normal ALP, no signs of obstructive jaundice  Diffuse mesenteric and body wall edema  Moderate left pleural effusion with left atelectasis and tiny right pleural effusion  Colonic diverticulosis  Plan: Advance to full liquid diet. Continue pantoprazole 40 mg every 12 hours-coffee-ground emesis is resolved and hemoglobin stable. Supportive management, currently on normal saline at 75 mL/h and IV Rocephin and pain management with Dilaudid 1.5 to 2 mg IV every 2 hours as needed, antiemetics as needed. If full  liquid diet tolerated, okay to advance to low-fat diet within 24 to 48 hours. GI will sign off, please recall if needed.  Ronnette Juniper, MD 11/02/2022, 1:41 PM

## 2022-11-02 NOTE — Progress Notes (Signed)
Subjective/Chief Complaint: Complains of shoulder pain No abdominal pain this morning   Objective: Vital signs in last 24 hours: Temp:  [97.4 F (36.3 C)-98.7 F (37.1 C)] 97.8 F (36.6 C) (02/25 0841) Pulse Rate:  [64-97] 87 (02/25 0700) Resp:  [8-22] 21 (02/25 0700) BP: (103-140)/(50-92) 115/63 (02/25 0700) SpO2:  [90 %-100 %] 90 % (02/25 0700) Weight:  [58.4 kg] 58.4 kg (02/25 0600) Last BM Date : 10/30/22  Intake/Output from previous day: 02/24 0701 - 02/25 0700 In: 1661.2 [P.O.:120; I.V.:1431.3; IV Piggyback:109.9] Out: 1700 [Urine:1700] Intake/Output this shift: No intake/output data recorded.  Exam: Sitting up in bed having breakfast Abdomen full, mildly tender but no frank peritonitis  Lab Results:  Recent Labs    11/01/22 1630 11/02/22 0643  WBC 15.2* 14.0*  HGB 8.0* 8.1*  HCT 24.2* 24.7*  PLT 213 245   BMET Recent Labs    11/01/22 0616 11/02/22 0643  NA 135 131*  K 3.8 3.7  CL 101 99  CO2 24 23  GLUCOSE 103* 100*  BUN 23 21  CREATININE 1.22 0.97  CALCIUM 7.8* 8.0*   PT/INR Recent Labs    10/30/22 2311  LABPROT 15.2  INR 1.2   ABG No results for input(s): "PHART", "HCO3" in the last 72 hours.  Invalid input(s): "PCO2", "PO2"  Studies/Results: CT ABDOMEN PELVIS W CONTRAST  Result Date: 11/01/2022 CLINICAL DATA:  Worsening abdominal pain. Splenic hemorrhage. Status post splenic artery embolization. Pancreatitis. EXAM: CT ABDOMEN AND PELVIS WITH CONTRAST TECHNIQUE: Multidetector CT imaging of the abdomen and pelvis was performed using the standard protocol following bolus administration of intravenous contrast. RADIATION DOSE REDUCTION: This exam was performed according to the departmental dose-optimization program which includes automated exposure control, adjustment of the mA and/or kV according to patient size and/or use of iterative reconstruction technique. CONTRAST:  133m OMNIPAQUE IOHEXOL 300 MG/ML  SOLN COMPARISON:  10/30/2022  FINDINGS: Lower Chest: Increased moderate left pleural effusion and left lower lobe atelectasis. New tiny right pleural effusion. Hepatobiliary: Few small hepatic cysts remains stable. No hepatic masses identified. 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 High attenuation gallbladder sludge again noted, however there is no evidence of acute cholecystitis. Pancreas: No pancreatic head mass identified. Diffuse pancreatic calcification is again seen, consistent with chronic pancreatitis. A 3.6 cm complex cystic lesion is seen adjacent to the pancreatic tail, which may represent pancreatic pseudocyst or hematoma. 1.0 cm fluid collection adjacent to the pancreatic neck is consistent with a small pseudocyst. Spleen: Complex splenic laceration and perisplenic hematoma are stable since prior study. Stable mild-to-moderate hemoperitoneum. Adrenals/Urinary Tract: No suspicious masses identified. No evidence of ureteral calculi or hydronephrosis. Stomach/Bowel: No evidence of obstruction, inflammatory process or abnormal fluid collections. Normal appendix visualized. Diverticulosis is seen mainly involving the descending and sigmoid colon, however there is no evidence of diverticulitis. Vascular/Lymphatic: No pathologically enlarged lymph nodes. No acute vascular findings. Aortic atherosclerotic calcification incidentally noted. Reproductive:  No mass or other significant abnormality. Other:  Increased diffuse mesenteric and body wall edema. Musculoskeletal:  No suspicious bone lesions identified. IMPRESSION: Stable complex splenic laceration and perisplenic hematoma. Stable mild-to-moderate hemoperitoneum. Stable 3.6 cm complex cystic lesion adjacent to the pancreatic tail, which may represent pancreatic pseudocyst or hematoma. Chronic calcific pancreatitis. 1 cm cystic lesion adjacent to the pancreatic neck, consistent with small pseudocyst. Stable diffuse  biliary ductal dilatation, with distal common bile duct stricture within the pancreatic head. Increased diffuse mesenteric and body wall  edema. Increased moderate left pleural effusion and left lower lobe atelectasis. New tiny right pleural effusion. Colonic diverticulosis, without radiographic evidence of diverticulitis. Aortic Atherosclerosis (ICD10-I70.0). Electronically Signed   By: Marlaine Hind M.D.   On: 11/01/2022 15:04   IR US Guide Vasc Access Right  Result Date: 10/31/2022 INDICATION: Pancreatitis and spontaneous splenic hemorrhage with parenchymal subcapsular hematoma. Hemoglobin has not responded appropriately to blood transfusion and the patient now presents for splenic artery embolization. EXAM: 1. ULTRASOUND GUIDANCE FOR VASCULAR ACCESS OF THE RIGHT COMMON FEMORAL ARTERY 2. VISCERAL SELECTIVE ARTERIOGRAPHY OF THE SPLENIC ARTERY 3. ADDITIONAL SELECTIVE ARTERIOGRAPHY OF THE DISTAL SPLENIC ARTERY 4. TRANSCATHETER EMBOLIZATION OF THE SPLENIC ARTERY TO TREAT ARTERIAL HEMORRHAGE MEDICATIONS: 2 g IV Ancef. The antibiotic was administered within 1 hour of the procedure ANESTHESIA/SEDATION: Moderate (conscious) sedation was employed during this procedure. A total of Versed 2.0 mg and Fentanyl 100 mcg was administered intravenously. Moderate Sedation Time: 55 minutes. The patient's level of consciousness and vital signs were monitored continuously by radiology nursing throughout the procedure under my direct supervision. CONTRAST:  25 mL Visipaque 320 FLUOROSCOPY TIME:  Radiation Exposure Index (as provided by the fluoroscopic device): [id number] mGy Kerma COMPLICATIONS: None immediate. PROCEDURE: Informed consent was obtained from the patient following explanation of the procedure, risks, benefits and alternatives. The patient understands, agrees and consents for the procedure. All questions were addressed. A time out was performed prior to the initiation of the procedure. Maximal barrier sterile technique  utilized including caps, mask, sterile gowns, sterile gloves, large sterile drape, hand hygiene, and chlorhexidine prep. During the procedure local anesthesia was provided with 1% lidocaine. Ultrasound was used to confirm patency of the right common femoral artery. A permanent ultrasound image was saved and recorded. The right common femoral artery was accessed utilizing a micropuncture set. A 5 French sheath was placed. A 5 French Cobra catheter was advanced into the abdominal aorta. The catheter was used to selectively catheterize the celiac axis. The catheter was then used to selectively catheterize the proximal splenic artery. Selective splenic arteriography was performed. A microcatheter was advanced through the 5 French catheter and further advanced into the distal aspect of the splenic artery. Additional selective splenic arteriography was performed. Embolization was performed through the microcatheter with initial deployment of a Medtronic MVP 5 vascular plug. Additional arteriography was performed through the microcatheter as well as periodic injection of contrast material under fluoroscopy to assess flow in the splenic artery. The microcatheter was removed. The 5 French catheter was then further advanced into the splenic artery. Additional arteriography was performed. An MVP 7 vascular plug was then deployed through the 5 [place] catheter. Additional arteriography was then performed through the 5 French catheter. The catheter was then removed. Hemostasis was obtained at the sheath access site with the Angio-Seal device. FINDINGS: Initial splenic arteriography demonstrates a tortuous splenic artery supplying an enlarged spleen. Intra-splenic branches are beaded and tortuous especially in the lower pole of the spleen. There is some displacement of vessels in the central aspect of the splenic parenchyma likely related to intraparenchymal hemorrhage. No evidence of overt contrast extravasation or arterial  pseudoaneurysm. Additional selective arteriography was performed near the splenic hilum in order to define bifurcation point of the main splenic artery. After placement of a single vascular plug, there was still flow noted around the plug and eventually the plug migrated in the artery slightly towards the splenic hilum. A second, larger vascular plug was therefore deployed in the distal splenic artery with significant  reduction in flow and adequate reduce flow noted. IMPRESSION: Successful embolization of the splenic artery with placement of two separate vascular plugs. Electronically Signed   By: [name] M.D.   On: 10/31/2022 16:43   IR Angiogram Visceral Selective  Result Date: 10/31/2022 INDICATION: Pancreatitis and spontaneous splenic hemorrhage with parenchymal subcapsular hematoma. Hemoglobin has not responded appropriately to blood transfusion and the patient now presents for splenic artery embolization. EXAM: 1. ULTRASOUND GUIDANCE FOR VASCULAR ACCESS OF THE RIGHT COMMON FEMORAL ARTERY 2. VISCERAL SELECTIVE ARTERIOGRAPHY OF THE SPLENIC ARTERY 3. ADDITIONAL SELECTIVE ARTERIOGRAPHY OF THE DISTAL SPLENIC ARTERY 4. TRANSCATHETER EMBOLIZATION OF THE SPLENIC ARTERY TO TREAT ARTERIAL HEMORRHAGE MEDICATIONS: 2 g IV Ancef. The antibiotic was administered within 1 hour of the procedure ANESTHESIA/SEDATION: Moderate (conscious) sedation was employed during this procedure. A total of Versed 2.0 mg and Fentanyl 100 mcg was administered intravenously. Moderate Sedation Time: 55 minutes. The patient's level of consciousness and vital signs were monitored continuously by radiology nursing throughout the procedure under my direct supervision. CONTRAST:  25 mL Visipaque 320 FLUOROSCOPY TIME:  Radiation Exposure Index (as provided by the fluoroscopic device): [id number] mGy Kerma COMPLICATIONS: None immediate. PROCEDURE: Informed consent was obtained from the patient following explanation of the procedure, risks, benefits  and alternatives. The patient understands, agrees and consents for the procedure. All questions were addressed. A time out was performed prior to the initiation of the procedure. Maximal barrier sterile technique utilized including caps, mask, sterile gowns, sterile gloves, large sterile drape, hand hygiene, and chlorhexidine prep. During the procedure local anesthesia was provided with 1% lidocaine. Ultrasound was used to confirm patency of the right common femoral artery. A permanent ultrasound image was saved and recorded. The right common femoral artery was accessed utilizing a micropuncture set. A 5 French sheath was placed. A 5 French Cobra catheter was advanced into the abdominal aorta. The catheter was used to selectively catheterize the celiac axis. The catheter was then used to selectively catheterize the proximal splenic artery. Selective splenic arteriography was performed. A microcatheter was advanced through the 5 French catheter and further advanced into the distal aspect of the splenic artery. Additional selective splenic arteriography was performed. Embolization was performed through the microcatheter with initial deployment of a Medtronic MVP 5 vascular plug. Additional arteriography was performed through the microcatheter as well as periodic injection of contrast material under fluoroscopy to assess flow in the splenic artery. The microcatheter was removed. The 5 French catheter was then further advanced into the splenic artery. Additional arteriography was performed. An MVP 7 vascular plug was then deployed through the 5 [place] catheter. Additional arteriography was then performed through the 5 French catheter. The catheter was then removed. Hemostasis was obtained at the sheath access site with the Angio-Seal device. FINDINGS: Initial splenic arteriography demonstrates a tortuous splenic artery supplying an enlarged spleen. Intra-splenic branches are beaded and tortuous especially in the lower  pole of the spleen. There is some displacement of vessels in the central aspect of the splenic parenchyma likely related to intraparenchymal hemorrhage. No evidence of overt contrast extravasation or arterial pseudoaneurysm. Additional selective arteriography was performed near the splenic hilum in order to define bifurcation point of the main splenic artery. After placement of a single vascular plug, there was still flow noted around the plug and eventually the plug migrated in the artery slightly towards the splenic hilum. A second, larger vascular plug was therefore deployed in the distal splenic artery with significant reduction in flow and adequate reduce  flow noted. IMPRESSION: Successful embolization of the splenic artery with placement of two separate vascular plugs. Electronically Signed   By: [name] M.D.   On: 10/31/2022 16:43   IR Angiogram Selective Each Additional Vessel  Result Date: 10/31/2022 INDICATION: Pancreatitis and spontaneous splenic hemorrhage with parenchymal subcapsular hematoma. Hemoglobin has not responded appropriately to blood transfusion and the patient now presents for splenic artery embolization. EXAM: 1. ULTRASOUND GUIDANCE FOR VASCULAR ACCESS OF THE RIGHT COMMON FEMORAL ARTERY 2. VISCERAL SELECTIVE ARTERIOGRAPHY OF THE SPLENIC ARTERY 3. ADDITIONAL SELECTIVE ARTERIOGRAPHY OF THE DISTAL SPLENIC ARTERY 4. TRANSCATHETER EMBOLIZATION OF THE SPLENIC ARTERY TO TREAT ARTERIAL HEMORRHAGE MEDICATIONS: 2 g IV Ancef. The antibiotic was administered within 1 hour of the procedure ANESTHESIA/SEDATION: Moderate (conscious) sedation was employed during this procedure. A total of Versed 2.0 mg and Fentanyl 100 mcg was administered intravenously. Moderate Sedation Time: 55 minutes. The patient's level of consciousness and vital signs were monitored continuously by radiology nursing throughout the procedure under my direct supervision. CONTRAST:  25 mL Visipaque 320 FLUOROSCOPY TIME:   Radiation Exposure Index (as provided by the fluoroscopic device): [id number] mGy Kerma COMPLICATIONS: None immediate. PROCEDURE: Informed consent was obtained from the patient following explanation of the procedure, risks, benefits and alternatives. The patient understands, agrees and consents for the procedure. All questions were addressed. A time out was performed prior to the initiation of the procedure. Maximal barrier sterile technique utilized including caps, mask, sterile gowns, sterile gloves, large sterile drape, hand hygiene, and chlorhexidine prep. During the procedure local anesthesia was provided with 1% lidocaine. Ultrasound was used to confirm patency of the right common femoral artery. A permanent ultrasound image was saved and recorded. The right common femoral artery was accessed utilizing a micropuncture set. A 5 French sheath was placed. A 5 French Cobra catheter was advanced into the abdominal aorta. The catheter was used to selectively catheterize the celiac axis. The catheter was then used to selectively catheterize the proximal splenic artery. Selective splenic arteriography was performed. A microcatheter was advanced through the 5 French catheter and further advanced into the distal aspect of the splenic artery. Additional selective splenic arteriography was performed. Embolization was performed through the microcatheter with initial deployment of a Medtronic MVP 5 vascular plug. Additional arteriography was performed through the microcatheter as well as periodic injection of contrast material under fluoroscopy to assess flow in the splenic artery. The microcatheter was removed. The 5 French catheter was then further advanced into the splenic artery. Additional arteriography was performed. An MVP 7 vascular plug was then deployed through the 5 [place] catheter. Additional arteriography was then performed through the 5 French catheter. The catheter was then removed. Hemostasis was obtained at the  sheath access site with the Angio-Seal device. FINDINGS: Initial splenic arteriography demonstrates a tortuous splenic artery supplying an enlarged spleen. Intra-splenic branches are beaded and tortuous especially in the lower pole of the spleen. There is some displacement of vessels in the central aspect of the splenic parenchyma likely related to intraparenchymal hemorrhage. No evidence of overt contrast extravasation or arterial pseudoaneurysm. Additional selective arteriography was performed near the splenic hilum in order to define bifurcation point of the main splenic artery. After placement of a single vascular plug, there was still flow noted around the plug and eventually the plug migrated in the artery slightly towards the splenic hilum. A second, larger vascular plug was therefore deployed in the distal splenic artery with significant reduction in flow and adequate reduce flow noted. IMPRESSION: Successful  embolization of the splenic artery with placement of two separate vascular plugs. Electronically Signed   By: [name] M.D.   On: 10/31/2022 16:43   IR EMBO ART  VEN HEMORR LYMPH EXTRAV  INC GUIDE ROADMAPPING  Result Date: 10/31/2022 INDICATION: Pancreatitis and spontaneous splenic hemorrhage with parenchymal subcapsular hematoma. Hemoglobin has not responded appropriately to blood transfusion and the patient now presents for splenic artery embolization. EXAM: 1. ULTRASOUND GUIDANCE FOR VASCULAR ACCESS OF THE RIGHT COMMON FEMORAL ARTERY 2. VISCERAL SELECTIVE ARTERIOGRAPHY OF THE SPLENIC ARTERY 3. ADDITIONAL SELECTIVE ARTERIOGRAPHY OF THE DISTAL SPLENIC ARTERY 4. TRANSCATHETER EMBOLIZATION OF THE SPLENIC ARTERY TO TREAT ARTERIAL HEMORRHAGE MEDICATIONS: 2 g IV Ancef. The antibiotic was administered within 1 hour of the procedure ANESTHESIA/SEDATION: Moderate (conscious) sedation was employed during this procedure. A total of Versed 2.0 mg and Fentanyl 100 mcg was administered intravenously.  Moderate Sedation Time: 55 minutes. The patient's level of consciousness and vital signs were monitored continuously by radiology nursing throughout the procedure under my direct supervision. CONTRAST:  25 mL Visipaque 320 FLUOROSCOPY TIME:  Radiation Exposure Index (as provided by the fluoroscopic device): [id number] mGy Kerma COMPLICATIONS: None immediate. PROCEDURE: Informed consent was obtained from the patient following explanation of the procedure, risks, benefits and alternatives. The patient understands, agrees and consents for the procedure. All questions were addressed. A time out was performed prior to the initiation of the procedure. Maximal barrier sterile technique utilized including caps, mask, sterile gowns, sterile gloves, large sterile drape, hand hygiene, and chlorhexidine prep. During the procedure local anesthesia was provided with 1% lidocaine. Ultrasound was used to confirm patency of the right common femoral artery. A permanent ultrasound image was saved and recorded. The right common femoral artery was accessed utilizing a micropuncture set. A 5 French sheath was placed. A 5 French Cobra catheter was advanced into the abdominal aorta. The catheter was used to selectively catheterize the celiac axis. The catheter was then used to selectively catheterize the proximal splenic artery. Selective splenic arteriography was performed. A microcatheter was advanced through the 5 French catheter and further advanced into the distal aspect of the splenic artery. Additional selective splenic arteriography was performed. Embolization was performed through the microcatheter with initial deployment of a Medtronic MVP 5 vascular plug. Additional arteriography was performed through the microcatheter as well as periodic injection of contrast material under fluoroscopy to assess flow in the splenic artery. The microcatheter was removed. The 5 French catheter was then further advanced into the splenic artery.  Additional arteriography was performed. An MVP 7 vascular plug was then deployed through the 5 [place] catheter. Additional arteriography was then performed through the 5 French catheter. The catheter was then removed. Hemostasis was obtained at the sheath access site with the Angio-Seal device. FINDINGS: Initial splenic arteriography demonstrates a tortuous splenic artery supplying an enlarged spleen. Intra-splenic branches are beaded and tortuous especially in the lower pole of the spleen. There is some displacement of vessels in the central aspect of the splenic parenchyma likely related to intraparenchymal hemorrhage. No evidence of overt contrast extravasation or arterial pseudoaneurysm. Additional selective arteriography was performed near the splenic hilum in order to define bifurcation point of the main splenic artery. After placement of a single vascular plug, there was still flow noted around the plug and eventually the plug migrated in the artery slightly towards the splenic hilum. A second, larger vascular plug was therefore deployed in the distal splenic artery with significant reduction in flow and adequate reduce flow noted.  IMPRESSION: Successful embolization of the splenic artery with placement of two separate vascular plugs. Electronically Signed   By: [name] M.D.   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    Problem List:   Principal Problem:    Syncope and collapse Active Problems:   Chronic pancreatitis due to chronic alcoholism (HCC)   Acute pancreatitis   Fatty liver, alcoholic   LFT elevation   Alcohol abuse, in remission   IDA (iron deficiency anemia)   Spleen hematoma   Hiatal hernia   Pancreatic calcification   CKD (chronic kidney disease) stage 2, GFR 60-89 ml/min   Hyperglycemia   Nausea & vomiting  Hgb stable s/p IR splenic artery embolization Repeat CT yesterday stable Ok to get out of bed from a surgical standpoint Will continue to follow Keep on clears today  Moderately complex medical decision making  Coralie Keens MD 11/02/2022

## 2022-11-02 NOTE — Plan of Care (Signed)

## 2022-11-03 ENCOUNTER — Inpatient Hospital Stay (HOSPITAL_COMMUNITY): Payer: Medicare PPO

## 2022-11-03 DIAGNOSIS — D735 Infarction of spleen: Secondary | ICD-10-CM | POA: Diagnosis not present

## 2022-11-03 LAB — GLUCOSE, CAPILLARY
Glucose-Capillary: 113 mg/dL — ABNORMAL HIGH (ref 70–99)
Glucose-Capillary: 113 mg/dL — ABNORMAL HIGH (ref 70–99)
Glucose-Capillary: 124 mg/dL — ABNORMAL HIGH (ref 70–99)
Glucose-Capillary: 136 mg/dL — ABNORMAL HIGH (ref 70–99)
Glucose-Capillary: 174 mg/dL — ABNORMAL HIGH (ref 70–99)

## 2022-11-03 LAB — BPAM RBC
Blood Product Expiration Date: 202403212359
Blood Product Expiration Date: 202403212359
Blood Product Expiration Date: 202403222359
Blood Product Expiration Date: 202403222359
Blood Product Expiration Date: 202403222359
ISSUE DATE / TIME: 202402222355
ISSUE DATE / TIME: 202402230719
ISSUE DATE / TIME: 202402231040
Unit Type and Rh: 5100
Unit Type and Rh: 5100
Unit Type and Rh: 5100
Unit Type and Rh: 5100
Unit Type and Rh: 5100

## 2022-11-03 LAB — BASIC METABOLIC PANEL
Anion gap: 5 (ref 5–15)
BUN: 18 mg/dL (ref 8–23)
CO2: 23 mmol/L (ref 22–32)
Calcium: 7.5 mg/dL — ABNORMAL LOW (ref 8.9–10.3)
Chloride: 102 mmol/L (ref 98–111)
Creatinine, Ser: 0.96 mg/dL (ref 0.61–1.24)
GFR, Estimated: >60 mL/min (ref >60)
Glucose, Bld: 110 mg/dL — ABNORMAL HIGH (ref 70–99)
Potassium: 3.6 mmol/L (ref 3.5–5.1)
Sodium: 130 mmol/L — ABNORMAL LOW (ref 135–145)

## 2022-11-03 LAB — TYPE AND SCREEN
ABO/RH(D): O POS
Antibody Screen: NEGATIVE
Unit division: 0
Unit division: 0
Unit division: 0
Unit division: 0
Unit division: 0

## 2022-11-03 LAB — BODY FLUID CELL COUNT WITH DIFFERENTIAL
Eos, Fluid: 0 %
Lymphs, Fluid: 3 %
Monocyte-Macrophage-Serous Fluid: 49 % — ABNORMAL LOW (ref 50–90)
Neutrophil Count, Fluid: 48 % — ABNORMAL HIGH (ref 0–25)
Total Nucleated Cell Count, Fluid: 4024 cu mm — ABNORMAL HIGH (ref 0–1000)

## 2022-11-03 LAB — PROTEIN, PLEURAL OR PERITONEAL FLUID: Total protein, fluid: 3 g/dL

## 2022-11-03 LAB — CBC
HCT: 23 % — ABNORMAL LOW (ref 39.0–52.0)
Hemoglobin: 7.5 g/dL — ABNORMAL LOW (ref 13.0–17.0)
MCH: 31.5 pg (ref 26.0–34.0)
MCHC: 32.6 g/dL (ref 30.0–36.0)
MCV: 96.6 fL (ref 80.0–100.0)
Platelets: 247 10*3/uL (ref 150–400)
RBC: 2.38 MIL/uL — ABNORMAL LOW (ref 4.22–5.81)
RDW: 17.5 % — ABNORMAL HIGH (ref 11.5–15.5)
WBC: 14.2 10*3/uL — ABNORMAL HIGH (ref 4.0–10.5)
nRBC: 0 % (ref 0.0–0.2)

## 2022-11-03 LAB — GRAM STAIN

## 2022-11-03 MED ORDER — LIDOCAINE HCL 1 % IJ SOLN
INTRAMUSCULAR | Status: AC
  Administered 2022-11-03: 15 mL
  Filled 2022-11-03: qty 20

## 2022-11-03 MED ORDER — POLYETHYLENE GLYCOL 3350 17 G PO PACK
17.0000 g | PACK | Freq: Every day | ORAL | Status: DC
  Administered 2022-11-03 – 2022-11-05 (×3): 17 g via ORAL
  Filled 2022-11-03 (×4): qty 1

## 2022-11-03 NOTE — TOC CM/SW Note (Signed)
  Transition of Care [facility]) Screening Note   Patient Details  Name: Jared Duran Date of Birth: 06-06-1951   [facility]) CM/SW Contact:    Lennart Pall, LCSW Phone Number: 11/03/2022, 9:23 AM    Transition of Care Department [facility]) has reviewed patient and no TOC needs have been identified at this time. We will continue to monitor patient advancement through interdisciplinary progression rounds. If new patient transition needs arise, please place a TOC consult.

## 2022-11-03 NOTE — Procedures (Signed)
Ultrasound-guided diagnostic and therapeutic left thoracentesis performed yielding 420 cc of hazy, yellow  fluid. No immediate complications. Follow-up chest x-ray pending. The fluid was sent to the lab for preordered studies. EBL < 2 cc.

## 2022-11-03 NOTE — Progress Notes (Signed)
PROGRESS NOTE  Jared Duran  [id number] DOB: [birth year] DOA: 10/30/2022 PCP: [name], MD   Brief Narrative: Patient is a 72 year old male with history of chronic pancreatitis related to alcohol, recently admitted for acute pancreatitis discharge last week presented back to the emergency department after syncopal episode. He was complaining of persistent left upper quadrant pain for couple of days, went to the bathroom, felt lightheaded and passed out.On presentation his blood pressure was soft, hemoglobin was 6.1 which dropped from 7.5 last week. Lab work showed creatinine of 2.2. CT imaging showed features concerning for splenic hemorrhage . General surgery, GI, IR consulted.  Transfused with 2 units of PRBC.  Underwent splenic arteriography and embolization.  Hospital course remarkable for persistent abdominal pain now getting better.  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  Underwent splenic arteriography and embolization.  Hospital course remarkable for persistent abdominal pain now feeling better. General surgery is closely following ,no plan for operative intervention for now. CT abdomen/pelvis done on 2/24 for follow-up showed stable complex splenic laceration and perisplenic hematoma. Stable mild-to-moderate hemoperitoneum.  Chronic pancreatitis: History of alcohol abuse.  Just discharged from here about a week ago  from this admission date.  Elevated lipase with mild elevated liver enzymes.  Imaging showed  moderate to severe chronic pancreatitis.  Abdomen remains slightly distended, bowel sounds present.  Abdominal pain is better today.  Mildly elevated lipase.  Currently on full liquid diet  Left-sided pleural effusion/acute hypoxic respiratory failure: Was requiring 4 L of oxygen on 2/24, now on 2 L.  CT abdomen/pelvis showed moderate pleural effusion on the left side, will attempt thoracentesis.  Acute blood loss anemia:-Transfused with 2 units of PRBC, currently hemoglobin stable in the range of 7-8  AKI/hyponatremia: Most likely secondary to volume loss from bleeding, hypoperfusion/hypotension.  Resolved.  IV fluids discontinued  Leukocytosis: Could be reactive.  Started on ceftriaxone 2 g empirically given history of splenic hemorrhage/immunocompromised status.  Patient is afebrile.  Weakness: PT consulted      DVT prophylaxis:SCDs Start: 10/31/22 0244     Code Status: Full Code  Family Communication: Discussed with the daughter  at bedside on 2/25  Patient status:Inpatient  Patient is from :Home  Anticipated discharge [id number]  Estimated DC date: 1 to 2 days, needs general surgery clearance   Consultants: General surgery, IR  Procedures: Splenic artery embolization  Antimicrobials:  Anti-infectives (From admission, onward)    Start     Dose/Rate Route Frequency Ordered Stop   11/01/22 0900  cefTRIAXone (ROCEPHIN) 2 g in sodium chloride 0.9 % 100 mL IVPB        2 g 200 mL/hr over 30 Minutes Intravenous Every 24 hours 11/01/22 0750     10/31/22 1100  ceFAZolin (ANCEF) IVPB 2g/100 mL premix        2 g 200 mL/hr over 30 Minutes Intravenous  Once 10/31/22 1059 10/31/22 1230  Subjective: Patient seen and examined at bedside today.  Sitting in the chair.  Looks better today.  Denies any significant abdominal pain.  No nausea or vomiting.  Had a bowel movement yesterday.   Abdomen remains slightly distended.  Objective: Vitals:   11/03/22 0700 11/03/22 0800 11/03/22 0900 11/03/22 1000  BP: 116/71 119/70 124/71 124/73  Pulse:      Resp: 19 (!) 21 19 (!) 29  Temp:      TempSrc:      SpO2: 100% 100% 100% 100%  Weight:      Height:        Intake/Output Summary (Last 24 hours) at 11/03/2022 1125 Last data filed at 11/03/2022 1000 Gross per 24 hour  Intake 1696.29 ml  Output 1300 ml  Net 396.29 ml   Filed Weights   10/31/22 1300 11/02/22 0600 11/03/22 0530  Weight: 55.5 kg 58.4 kg 58.7 kg    Examination:   General exam: Overall comfortable, not in distress HEENT: PERRL Respiratory system:  no wheezes or crackles , diminished breath sounds on the left side Cardiovascular system: S1 & S2 heard, RRR.  Gastrointestinal system: Abdomen is mildly distended, soft .  Bowel sounds present Central nervous system: Alert and oriented Extremities: No edema, no clubbing ,no cyanosis Skin: No rashes, no ulcers,no icterus     Data Reviewed: I have personally reviewed following labs and imaging studies  CBC: Recent Labs  Lab 10/30/22 1819 10/31/22 0516 10/31/22 1535 11/01/22 0616 11/01/22 1630 11/02/22 0643 11/03/22 0259  WBC 19.4*   < > 16.8* 15.9* 15.2* 14.0* 14.2*  NEUTROABS 15.8*  --   --   --   --   --   --   HGB 6.1*   < > 8.9* 8.4* 8.0* 8.1* 7.5*  HCT 18.2*   < > 26.2* 25.1* 24.2* 24.7* 23.0*  MCV 100.6*   < > 91.9 93.7 95.3 96.1 96.6  PLT 354   < > 248 217 213 245 247   < > = values in this interval not displayed.   Basic Metabolic Panel: Recent Labs  Lab 10/30/22 1819 10/31/22 1535 11/01/22 0616 11/02/22 0643 11/03/22 0259  NA 130* 135 135 131* 130*  K 4.6 3.9 3.8 3.7 3.6  CL 103 108 101 99 102  CO2 18* '22 24 23 23  '$ GLUCOSE 142* 113* 103* 100* 110*  BUN 29* 29* '23 21 18  '$ CREATININE 2.27* 1.49* 1.22 0.97 0.96  CALCIUM 7.5* 7.6* 7.8* 8.0* 7.5*     Recent Results (from the past 240 hour(s))  MRSA Next Gen by PCR, Nasal      Status: None   Collection Time: 10/31/22  1:00 PM   Specimen: Nasal Mucosa; Nasal Swab  Result Value Ref Range Status   MRSA by PCR Next Gen NOT DETECTED NOT DETECTED Final    Comment: (NOTE) The GeneXpert MRSA Assay (FDA approved for NASAL specimens only), is one component of a comprehensive MRSA colonization surveillance program. It is not intended to diagnose MRSA infection nor to guide or monitor treatment for MRSA infections. Test performance is not FDA approved in patients less than 90 years old. Performed at [facility], [place] [address] [place], [place] [address]      Radiology Studies: DG CHEST PORT 1 VIEW  Result Date: 11/02/2022 CLINICAL DATA:  Shortness of breath. Congestive cough. EXAM: PORTABLE CHEST 1 VIEW COMPARISON:  Radiograph 10/30/2022, chest CT 10/19/2022. Lung bases from abdominal CT earlier today FINDINGS: Increasing left pleural  effusion with hazy opacity overlying the left lower lung zone. Grossly stable heart size and mediastinal contours. Slight increase in right infrahilar atelectasis. No pneumothorax. No pulmonary edema. IMPRESSION: Increasing left pleural effusion with hazy opacity overlying the left lower lung zone. Increasing right infrahilar atelectasis. Electronically Signed   By: Keith Rake M.D.   On: 11/02/2022 15:19   CT ABDOMEN PELVIS W CONTRAST  Result Date: 11/01/2022 CLINICAL DATA:  Worsening abdominal pain. Splenic hemorrhage. Status post splenic artery embolization. Pancreatitis. EXAM: CT ABDOMEN AND PELVIS WITH CONTRAST TECHNIQUE: Multidetector CT imaging of the abdomen and pelvis was performed using the standard protocol following bolus administration of intravenous contrast. RADIATION DOSE REDUCTION: This exam was performed according to the departmental dose-optimization program which includes automated exposure control, adjustment of the mA and/or kV according to patient size and/or use of iterative reconstruction  technique. CONTRAST:  147m OMNIPAQUE IOHEXOL 300 MG/ML  SOLN COMPARISON:  10/30/2022 FINDINGS: Lower Chest: Increased moderate left pleural effusion and left lower lobe atelectasis. New tiny right pleural effusion. Hepatobiliary: Few small hepatic cysts remains stable. No hepatic masses identified. 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 High attenuation gallbladder sludge again noted, however there is no evidence of acute cholecystitis. Pancreas: No pancreatic head mass identified. Diffuse pancreatic calcification is again seen, consistent with chronic pancreatitis. A 3.6 cm complex cystic lesion is seen adjacent to the pancreatic tail, which may represent pancreatic pseudocyst or hematoma. 1.0 cm fluid collection adjacent to the pancreatic neck is consistent with a small pseudocyst. Spleen: Complex splenic laceration and perisplenic hematoma are stable since prior study. Stable mild-to-moderate hemoperitoneum. Adrenals/Urinary Tract: No suspicious masses identified. No evidence of ureteral calculi or hydronephrosis. Stomach/Bowel: No evidence of obstruction, inflammatory process or abnormal fluid collections. Normal appendix visualized. Diverticulosis is seen mainly involving the descending and sigmoid colon, however there is no evidence of diverticulitis. Vascular/Lymphatic: No pathologically enlarged lymph nodes. No acute vascular findings. Aortic atherosclerotic calcification incidentally noted. Reproductive:  No mass or other significant abnormality. Other:  Increased diffuse mesenteric and body wall edema. Musculoskeletal:  No suspicious bone lesions identified. IMPRESSION: Stable complex splenic laceration and perisplenic hematoma. Stable mild-to-moderate hemoperitoneum. Stable 3.6 cm complex cystic lesion adjacent to the pancreatic tail, which may represent pancreatic pseudocyst or hematoma. Chronic calcific pancreatitis. 1 cm cystic  lesion adjacent to the pancreatic neck, consistent with small pseudocyst. 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 Increased diffuse mesenteric and body wall edema. Increased moderate left pleural effusion and left lower lobe atelectasis. New tiny right pleural effusion. Colonic diverticulosis, without radiographic evidence of diverticulitis. Aortic Atherosclerosis (ICD10-I70.0). Electronically Signed   By: JMarlaine HindM.D.   On: 11/01/2022 15:04    Scheduled Meds:  Chlorhexidine Gluconate Cloth  6 each Topical Q2000   lip balm   Topical BID   pantoprazole (PROTONIX) IV  40 mg Intravenous Q12H   polycarbophil  625 mg Oral BID   Continuous Infusions:  cefTRIAXone (ROCEPHIN)  IV Stopped (11/03/22 0908)   methocarbamol (ROBAXIN) IV     ondansetron (ZOFRAN) IV       LOS: 4 days   AShelly Coss MD [facility] P2/26/2024, 11:25 AM

## 2022-11-03 NOTE — Evaluation (Signed)
Physical Therapy Evaluation Patient Details Name: Jared Duran MRN: [id number] DOB: 04/29/1951 Today's Date: 11/03/2022  History of Present Illness  72 yo male admitted with R groin hematoma, acute on chronic pancreatitis, splenic hemorrhage s/p splenic arteriography + embolization 10/31/22. s/p thoracentesis 2/26  Clinical Impression  On eval, pt was Min guard A for mobility. He walked ~40 feet around the room. Mild lightheadedness and unsteadiness. O2 91% on RA at rest, 87% on RA with ambulation. Dyspena 2/4 + coughing with activity. Will plan to follow and progress activity as tolerated.        Recommendations for follow up therapy are one component of a multi-disciplinary discharge planning process, led by the attending physician.  Recommendations may be updated based on patient status, additional functional criteria and insurance authorization.  Follow Up Recommendations        Assistance Recommended at Discharge Intermittent Supervision/Assistance  Patient can return home with the following  A little help with walking and/or transfers;A little help with bathing/dressing/bathroom;Assistance with cooking/housework;Assist for transportation;Help with stairs or ramp for entrance    Equipment Recommendations None recommended by PT  Recommendations for Other Services       Functional Status Assessment Patient has had a recent decline in their functional status and demonstrates the ability to make significant improvements in function in a reasonable and predictable amount of time.     Precautions / Restrictions Precautions Precautions: Fall Restrictions Weight Bearing Restrictions: No      Mobility  Bed Mobility Overal bed mobility: Modified Independent                  Transfers Overall transfer level: Modified independent                      Ambulation/Gait Ambulation/Gait assistance: Min guard Gait Distance (Feet): 40 Feet Assistive device:  None Gait Pattern/deviations: Step-through pattern, Decreased stride length       General Gait Details: Mildly unsteady. O2 87% on RA, dyspnea 2/4 + coughing. Mild lightheadedness  Stairs            Wheelchair Mobility    Modified Rankin (Stroke Patients Only)       Balance Overall balance assessment: Needs assistance           Standing balance-Leahy Scale: Fair                               Pertinent Vitals/Pain Pain Assessment Pain Assessment: 0-10 Pain Score: 7  Pain Location: L shoulder Pain Descriptors / Indicators: Aching Pain Intervention(s): Monitored during session    Home Living Family/patient expects to be discharged to:: Private residence Living Arrangements: Spouse/significant other   Type of Home: House Home Access: Stairs to enter   Technical brewer of Steps: 3   Home Layout: One level Home Equipment: None      Prior Function Prior Level of Function : Independent/Modified Independent                     Hand Dominance        Extremity/Trunk Assessment   Upper Extremity Assessment Upper Extremity Assessment: Generalized weakness    Lower Extremity Assessment Lower Extremity Assessment: Generalized weakness    Cervical / Trunk Assessment Cervical / Trunk Assessment: Normal  Communication   Communication: No difficulties  Cognition Arousal/Alertness: Awake/alert Behavior During Therapy: WFL for tasks assessed/performed Overall Cognitive Status: Within Functional Limits for tasks  assessed                                          General Comments      Exercises     Assessment/Plan    PT Assessment Patient needs continued PT services  PT Problem List Decreased mobility;Decreased activity tolerance;Decreased balance;Decreased strength;Pain       PT Treatment Interventions DME instruction;Gait training;Functional mobility training;Therapeutic activities;Balance  training;Patient/family education;Therapeutic exercise    PT Goals (Current goals can be found in the Care Plan section)  Acute Rehab PT Goals Patient Stated Goal: none stated PT Goal Formulation: With patient Time For Goal Achievement: 11/17/22 Potential to Achieve Goals: Good    Frequency Min 3X/week     Co-evaluation               AM-PAC PT "6 Clicks" Mobility  Outcome Measure Help needed turning from your back to your side while in a flat bed without using bedrails?: None Help needed moving from lying on your back to sitting on the side of a flat bed without using bedrails?: None Help needed moving to and from a bed to a chair (including a wheelchair)?: A Little Help needed standing up from a chair using your arms (e.g., wheelchair or bedside chair)?: A Little Help needed to walk in hospital room?: A Little Help needed climbing 3-5 steps with a railing? : A Little 6 Click Score: 20    End of Session   Activity Tolerance: Patient tolerated treatment well Patient left: in bed;with call bell/phone within reach;with bed alarm set   PT Visit Diagnosis: Difficulty in walking, not elsewhere classified (R26.2)    Time: [id number] PT Time Calculation (min) (ACUTE ONLY): 19 min   Charges:   PT Evaluation $PT Eval Low Complexity: 1 Low             Doreatha Massed, PT Acute Rehabilitation  Office: [phone]

## 2022-11-03 NOTE — Progress Notes (Signed)
[facility] Progress Note     Subjective: CC:  Denies abdominal pain. Reports nausea that is chronic. Denies vomiting. States he is passing gas and had a BM last night (BM not documented in epic).   Objective: Vital signs in last 24 hours: Temp:  [97.7 F (36.5 C)-98.5 F (36.9 C)] 98.5 F (36.9 C) (02/26 0621) Pulse Rate:  [80-87] 86 (02/25 1900) Resp:  [12-25] 18 (02/26 0600) BP: (103-129)/(55-82) 120/75 (02/26 0600) SpO2:  [92 %-100 %] 100 % (02/26 0400) Weight:  [58.7 kg] 58.7 kg (02/26 0530) Last BM Date : 10/30/22  Intake/Output from previous day: 02/25 0701 - 02/26 0700 In: 1366.3 [I.V.:1276.2; IV Piggyback:90.1] Out: 1300 [Urine:1300] Intake/Output this shift: No intake/output data recorded.  PE: Gen:  Alert, NAD, chronically ill appearing, sclerae appear slightly yellow  Card:  Regular rate and rhythm Pulm:  Normal effort Abd: Soft, moderate distention, tympanic, nontender, no peritonitis  GU: external catheter  Skin: warm and dry, no rashes  Psych: A&Ox3   Lab Results:  Recent Labs    11/02/22 0643 11/03/22 0259  WBC 14.0* 14.2*  HGB 8.1* 7.5*  HCT 24.7* 23.0*  PLT 245 247   BMET Recent Labs    11/02/22 0643 11/03/22 0259  NA 131* 130*  K 3.7 3.6  CL 99 102  CO2 23 23  GLUCOSE 100* 110*  BUN 21 18  CREATININE 0.97 0.96  CALCIUM 8.0* 7.5*   PT/INR No results for input(s): "LABPROT", "INR" in the last 72 hours. CMP     Component Value Date/Time   NA 130 (L) 11/03/2022 0259   K 3.6 11/03/2022 0259   CL 102 11/03/2022 0259   CO2 23 11/03/2022 0259   GLUCOSE 110 (H) 11/03/2022 0259   BUN 18 11/03/2022 0259   CREATININE 0.96 11/03/2022 0259   CALCIUM 7.5 (L) 11/03/2022 0259   PROT 5.6 (L) 11/02/2022 0643   ALBUMIN 1.9 (L) 11/02/2022 0643   AST 17 11/02/2022 0643   ALT 21 11/02/2022 0643   ALKPHOS 101 11/02/2022 0643   BILITOT 2.9 (H) 11/02/2022 0643   GFRNONAA >60 11/03/2022 0259   GFRAA >60 04/11/2020 0523   Lipase      Component Value Date/Time   LIPASE 167 (H) 11/02/2022 0643       Studies/Results: DG CHEST PORT 1 VIEW  Result Date: 11/02/2022 CLINICAL DATA:  Shortness of breath. Congestive cough. EXAM: PORTABLE CHEST 1 VIEW COMPARISON:  Radiograph 10/30/2022, chest CT 10/19/2022. Lung bases from abdominal CT earlier today FINDINGS: Increasing left pleural effusion with hazy opacity overlying the left lower lung zone. Grossly stable heart size and mediastinal contours. Slight increase in right infrahilar atelectasis. No pneumothorax. No pulmonary edema. IMPRESSION: Increasing left pleural effusion with hazy opacity overlying the left lower lung zone. Increasing right infrahilar atelectasis. Electronically Signed   By: Keith Rake M.D.   On: 11/02/2022 15:19   CT ABDOMEN PELVIS W CONTRAST  Result Date: 11/01/2022 CLINICAL DATA:  Worsening abdominal pain. Splenic hemorrhage. Status post splenic artery embolization. Pancreatitis. EXAM: CT ABDOMEN AND PELVIS WITH CONTRAST TECHNIQUE: Multidetector CT imaging of the abdomen and pelvis was performed using the standard protocol following bolus administration of intravenous contrast. RADIATION DOSE REDUCTION: This exam was performed according to the departmental dose-optimization program which includes automated exposure control, adjustment of the mA and/or kV according to patient size and/or use of iterative reconstruction technique. CONTRAST:  140m OMNIPAQUE IOHEXOL 300 MG/ML  SOLN COMPARISON:  10/30/2022 FINDINGS: Lower Chest: Increased moderate  left pleural effusion and left lower lobe atelectasis. New tiny right pleural effusion. Hepatobiliary: Few small hepatic cysts remains stable. No hepatic masses identified. 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 High attenuation gallbladder sludge again noted, however there is no evidence of acute cholecystitis. Pancreas: No pancreatic head mass  identified. Diffuse pancreatic calcification is again seen, consistent with chronic pancreatitis. A 3.6 cm complex cystic lesion is seen adjacent to the pancreatic tail, which may represent pancreatic pseudocyst or hematoma. 1.0 cm fluid collection adjacent to the pancreatic neck is consistent with a small pseudocyst. Spleen: Complex splenic laceration and perisplenic hematoma are stable since prior study. Stable mild-to-moderate hemoperitoneum. Adrenals/Urinary Tract: No suspicious masses identified. No evidence of ureteral calculi or hydronephrosis. Stomach/Bowel: No evidence of obstruction, inflammatory process or abnormal fluid collections. Normal appendix visualized. Diverticulosis is seen mainly involving the descending and sigmoid colon, however there is no evidence of diverticulitis. Vascular/Lymphatic: No pathologically enlarged lymph nodes. No acute vascular findings. Aortic atherosclerotic calcification incidentally noted. Reproductive:  No mass or other significant abnormality. Other:  Increased diffuse mesenteric and body wall edema. Musculoskeletal:  No suspicious bone lesions identified. IMPRESSION: Stable complex splenic laceration and perisplenic hematoma. Stable mild-to-moderate hemoperitoneum. Stable 3.6 cm complex cystic lesion adjacent to the pancreatic tail, which may represent pancreatic pseudocyst or hematoma. Chronic calcific pancreatitis. 1 cm cystic lesion adjacent to the pancreatic neck, consistent with small pseudocyst. 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 Increased diffuse mesenteric and body wall edema. Increased moderate left pleural effusion and left lower lobe atelectasis. New tiny right pleural effusion. Colonic diverticulosis, without radiographic evidence of diverticulitis. Aortic Atherosclerosis (ICD10-I70.0). Electronically Signed   By: Marlaine Hind M.D.   On: 11/01/2022 15:04     Anti-infectives: Anti-infectives (From admission, onward)    Start     Dose/Rate Route Frequency Ordered Stop   11/01/22 0900  cefTRIAXone (ROCEPHIN) 2 g in sodium chloride 0.9 % 100 mL IVPB        2 g 200 mL/hr over 30 Minutes Intravenous Every 24 hours 11/01/22 0750     10/31/22 1100  ceFAZolin (ANCEF) IVPB 2g/100 mL premix        2 g 200 mL/hr over 30 Minutes Intravenous  Once 10/31/22 1059 10/31/22 1230        Assessment/Plan Splenic hemorrhage s/p IR embolization of main splenic artery 2/23 - afebrile, VSS, hgb 7.5 from 8.1 which is overall stable  - abdominal exam improving and nontender but still distended - advance to FLD, ensure and monitor  - mobilize, OOB   FEN: FLD ID: ceftriaxone 2/24 >> no abx needed from a surgical standpoint ; leukocytosis reactive to splenic embolization and  VTE: SCD's, chemical VTE held due ABL anemia requiring transfusion. Dispo: currently in progressive care, likely ok to go to the floor   ABL anemia - s/p 3 u pRBC 2/23 Acute on chronic pancreatitis related to EtOh abuse - lipase downtrending, 167 yesterday, 581 13 days ago Pleural effusion     LOS: 4 days   I reviewed nursing notes, hospitalist notes, last 24 h vitals and pain scores, last 48 h intake and output, last 24 h labs and trends, and last 24 h imaging results.  This care required moderate level of medical decision making.   Obie Dredge, PA-C [facility] Please see Amion for pager number during day hours 7:00am-4:30pm

## 2022-11-04 ENCOUNTER — Inpatient Hospital Stay (HOSPITAL_COMMUNITY): Payer: Medicare PPO

## 2022-11-04 DIAGNOSIS — D735 Infarction of spleen: Secondary | ICD-10-CM | POA: Diagnosis not present

## 2022-11-04 LAB — COMPREHENSIVE METABOLIC PANEL
ALT: 14 U/L (ref 0–44)
AST: 18 U/L (ref 15–41)
Albumin: 2 g/dL — ABNORMAL LOW (ref 3.5–5.0)
Alkaline Phosphatase: 86 U/L (ref 38–126)
Anion gap: 7 (ref 5–15)
BUN: 14 mg/dL (ref 8–23)
CO2: 22 mmol/L (ref 22–32)
Calcium: 7.9 mg/dL — ABNORMAL LOW (ref 8.9–10.3)
Chloride: 103 mmol/L (ref 98–111)
Creatinine, Ser: 0.96 mg/dL (ref 0.61–1.24)
GFR, Estimated: >60 mL/min (ref >60)
Glucose, Bld: 118 mg/dL — ABNORMAL HIGH (ref 70–99)
Potassium: 3.6 mmol/L (ref 3.5–5.1)
Sodium: 132 mmol/L — ABNORMAL LOW (ref 135–145)
Total Bilirubin: 2.4 mg/dL — ABNORMAL HIGH (ref 0.3–1.2)
Total Protein: 5.9 g/dL — ABNORMAL LOW (ref 6.5–8.1)

## 2022-11-04 LAB — TROPONIN I (HIGH SENSITIVITY)
Troponin I (High Sensitivity): 3 ng/L (ref <18)
Troponin I (High Sensitivity): 3 ng/L (ref <18)

## 2022-11-04 LAB — CBC
HCT: 28.1 % — ABNORMAL LOW (ref 39.0–52.0)
Hemoglobin: 9 g/dL — ABNORMAL LOW (ref 13.0–17.0)
MCH: 31.4 pg (ref 26.0–34.0)
MCHC: 32 g/dL (ref 30.0–36.0)
MCV: 97.9 fL (ref 80.0–100.0)
Platelets: 294 10*3/uL (ref 150–400)
RBC: 2.87 MIL/uL — ABNORMAL LOW (ref 4.22–5.81)
RDW: 16.8 % — ABNORMAL HIGH (ref 11.5–15.5)
WBC: 21 10*3/uL — ABNORMAL HIGH (ref 4.0–10.5)
nRBC: 0.1 % (ref 0.0–0.2)

## 2022-11-04 LAB — LIPASE, BLOOD: Lipase: 168 U/L — ABNORMAL HIGH (ref 11–51)

## 2022-11-04 LAB — GLUCOSE, CAPILLARY: Glucose-Capillary: 132 mg/dL — ABNORMAL HIGH (ref 70–99)

## 2022-11-04 LAB — PROCALCITONIN: Procalcitonin: 2.59 ng/mL

## 2022-11-04 MED ORDER — HYDROMORPHONE HCL 1 MG/ML IJ SOLN
0.5000 mg | INTRAMUSCULAR | Status: DC | PRN
  Administered 2022-11-04 – 2022-11-05 (×5): 0.5 mg via INTRAVENOUS
  Filled 2022-11-04 (×5): qty 0.5

## 2022-11-04 MED ORDER — MORPHINE SULFATE (PF) 2 MG/ML IV SOLN
2.0000 mg | Freq: Once | INTRAVENOUS | Status: AC
  Administered 2022-11-04: 2 mg via INTRAVENOUS
  Filled 2022-11-04: qty 1

## 2022-11-04 MED ORDER — NITROGLYCERIN 0.4 MG SL SUBL
0.4000 mg | SUBLINGUAL_TABLET | SUBLINGUAL | Status: DC | PRN
  Administered 2022-11-04 (×2): 0.4 mg via SUBLINGUAL
  Filled 2022-11-04 (×2): qty 1

## 2022-11-04 NOTE — Progress Notes (Signed)
Patient reports pain to left lower ribcage area 8/10. EKG performed, NSR. VSS. Dilaudid '1mg'$  administered IV. Dr Tawanna Solo notified of same.

## 2022-11-04 NOTE — Progress Notes (Signed)
PROGRESS NOTE  Jared Duran  [id number] DOB: 20-Nov-1950 DOA: 10/30/2022 PCP: [name], MD   Brief Narrative: Patient is a 72 year old male with history of chronic pancreatitis related to alcohol, recently admitted for acute pancreatitis discharge last week presented back to the emergency department after syncopal episode. He was complaining of persistent left upper quadrant pain for couple of days, went to the bathroom, felt lightheaded and passed out.On presentation his blood pressure was soft, hemoglobin was 6.1. CT imaging showed features concerning for splenic hemorrhage . General surgery, GI, IR consulted.  Transfused with 2 units of PRBC.  Underwent splenic arteriography and embolization.  Hospital course remarkable for persistent abdominal pain now getting better.Worsening leucocytosis ,unclear etiology.  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  Underwent splenic arteriography and embolization.  Hospital course remarkable for persistent abdominal pain now feeling better. General surgery is closely following ,no plan for operative intervention for now. CT abdomen/pelvis done on 2/24 for follow-up showed stable complex splenic laceration and perisplenic hematoma. Stable mild-to-moderate hemoperitoneum.  Chronic pancreatitis: History of alcohol abuse.  Just discharged from here about a week ago from this admission date.   Elevated lipase with mild elevated liver enzymes.  Imaging showed  moderate to severe chronic pancreatitis.  Abdomen remains slightly distended, bowel sounds present.  Abdominal pain is better today.  Mildly elevated lipase.  He had a bowel movement yesterday.  Diet advanced to regular   Left-sided pleural effusion/acute hypoxic respiratory failure: Was requiring 4 L of oxygen on 2/24, now on 2 L.  CT abdomen/pelvis showed moderate pleural effusion on the left side, underwent thoracentesis on 2/26 with removal of 420 cc of hazy,yellow fluid.  Will continue to wean the oxygen.We will follow-up pleural fluid cytology, culture. Complain of chest pain today.  EKG did not show any extremities, troponins pending  Leukocytosis:   Started on ceftriaxone 2 g empirically given history of splenic hemorrhage/immunocompromised status.  Patient is afebrile.  Unclear etiology.  No clear evidence of pneumonia, UTI, or abdominal abscess.  Procalcitonin elevated.  Continue antibiotics.  Culture sent.  If leukocytosis persist, we may need to do pan CT  Acute blood loss anemia:-Transfused with 2 units of PRBC, currently hemoglobin stable in the range of 9  AKI/hyponatremia: Most likely secondary to volume loss from bleeding, hypoperfusion/hypotension.  Resolved.  IV fluids discontinued  Weakness: PT consulted,no follow up recommended      DVT prophylaxis:SCDs Start: 10/31/22 0244     Code Status: Full Code  Family Communication: Discussed with the daughter  at bedside on 2/25  Patient status:Inpatient  Patient is from :Home  Anticipated discharge [id number]  Estimated DC date: 1 to 2 days, waiting for improvement of the leukocytosis, weaning of oxygen  Consultants: General surgery, IR  Procedures: Splenic artery embolization  Antimicrobials:  Anti-infectives (From admission, onward)    Start     Dose/Rate Route Frequency Ordered Stop   11/01/22  0900  cefTRIAXone (ROCEPHIN) 2 g in sodium chloride 0.9 %  100 mL IVPB        2 g 200 mL/hr over 30 Minutes Intravenous Every 24 hours 11/01/22 0750     10/31/22 1100  ceFAZolin (ANCEF) IVPB 2g/100 mL premix        2 g 200 mL/hr over 30 Minutes Intravenous  Once 10/31/22 1059 10/31/22 1230       Subjective: Patient seen and examined at bedside today.  During my evaluation, he was comfortable, on 2 L of oxygen.  Denies any worsening shortness of breath or cough.  Abdominal pain has resolved.  Had a bowel movement yesterday.  Objective: Vitals:   11/04/22 0613 11/04/22 0841 11/04/22 0911 11/04/22 1332  BP: 119/70   120/84  Pulse: 94   90  Resp: '16 20 15   '$ Temp: 97.8 F (36.6 C)     TempSrc: Oral     SpO2: 95%   100%  Weight:      Height:        Intake/Output Summary (Last 24 hours) at 11/04/2022 1403 Last data filed at 11/04/2022 1300 Gross per 24 hour  Intake 240 ml  Output 475 ml  Net -235 ml   Filed Weights   10/31/22 1300 11/02/22 0600 11/03/22 0530  Weight: 55.5 kg 58.4 kg 58.7 kg    Examination:   General exam: Overall comfortable, not in distress HEENT: PERRL Respiratory system:  no wheezes or crackles, mild diminished sounds on the left side Cardiovascular system: S1 & S2 heard, RRR.  Gastrointestinal system: Abdomen is mildly distended, soft and nontender. Central nervous system: Alert and oriented Extremities: No edema, no clubbing ,no cyanosis Skin: No rashes, no ulcers,no icterus     Data Reviewed: I have personally reviewed following labs and imaging studies  CBC: Recent Labs  Lab 10/30/22 1819 10/31/22 0516 11/01/22 0616 11/01/22 1630 11/02/22 0643 11/03/22 0259 11/04/22 0622  WBC 19.4*   < > 15.9* 15.2* 14.0* 14.2* 21.0*  NEUTROABS 15.8*  --   --   --   --   --   --   HGB 6.1*   < > 8.4* 8.0* 8.1* 7.5* 9.0*  HCT 18.2*   < > 25.1* 24.2* 24.7* 23.0* 28.1*  MCV 100.6*   < > 93.7 95.3 96.1 96.6 97.9  PLT 354   < > 217 213 245 247 294   < > = values in this interval not displayed.   Basic  Metabolic Panel: Recent Labs  Lab 10/31/22 1535 11/01/22 0616 11/02/22 0643 11/03/22 0259 11/04/22 0622  NA 135 135 131* 130* 132*  K 3.9 3.8 3.7 3.6 3.6  CL 108 101 99 102 103  CO2 '22 24 23 23 22  '$ GLUCOSE 113* 103* 100* 110* 118*  BUN 29* '23 21 18 14  '$ CREATININE 1.49* 1.22 0.97 0.96 0.96  CALCIUM 7.6* 7.8* 8.0* 7.5* 7.9*     Recent Results (from the past 240 hour(s))  MRSA Next Gen by PCR, Nasal     Status: None   Collection Time: 10/31/22  1:00 PM   Specimen: Nasal Mucosa; Nasal Swab  Result Value Ref Range Status   MRSA by PCR Next Gen NOT DETECTED NOT DETECTED Final    Comment: (NOTE) The GeneXpert MRSA Assay (FDA approved for NASAL specimens only), is one component of a comprehensive MRSA colonization surveillance program. It is not intended to diagnose MRSA infection nor to guide or monitor treatment for MRSA infections. Test  performance is not FDA approved in patients less than 63 years old. Performed at [facility], [place] [address], [place], [place] [address]   Gram stain     Status: None   Collection Time: 11/03/22  2:30 PM   Specimen: PATH Cytology Pleural fluid  Result Value Ref Range Status   Specimen Description   Final    PLEURAL Performed at [place] [address], [place], [place] [address]    Special Requests   Final    NONE Performed at [facility], [place] [address], [place], [place] [address]    Gram Stain   Final    CYTOSPIN SMEAR WBC PRESENT, PREDOMINANTLY PMN NO ORGANISMS SEEN Performed at [facility], [place] [address], [place], [place] [address]    Report Status 11/03/2022 FINAL  Final     Radiology Studies: DG Abd Portable 1V  Result Date: 11/04/2022 CLINICAL DATA:  Abdominal pain. EXAM: PORTABLE ABDOMEN - 1 VIEW COMPARISON:  Radiographs 10/14/2018.  CT 11/01/2022. FINDINGS: 0756 hours. Single supine view of the abdomen excludes the upper abdomen. The bowel gas pattern  is nonobstructive. There is residual contrast material within the colon near the splenic flexure. A small amount of contrast is present in the urinary bladder. No suspicious abdominal calcifications. The bones appear unchanged. IMPRESSION: No radiographic evidence of acute abdominal process. See recent CT report. Electronically Signed   By: Richardean Sale M.D.   On: 11/04/2022 08:08   US THORACENTESIS ASP PLEURAL SPACE W/IMG GUIDE  Result Date: 11/03/2022 INDICATION: Patient with history of chronic pancreatitis, recent splenic hemorrhage /embolization of splenic artery, left pleural effusion; request received for diagnostic and therapeutic left thoracentesis EXAM: ULTRASOUND GUIDED DIAGNOSTIC AND THERAPEUTIC LEFT THORACENTESIS MEDICATIONS: 8 ml 1% lidocaine COMPLICATIONS: None immediate. PROCEDURE: An ultrasound guided thoracentesis was thoroughly discussed with the patient and questions answered. The benefits, risks, alternatives and complications were also discussed. The patient understands and wishes to proceed with the procedure. Written consent was obtained. Ultrasound was performed to localize and mark an adequate pocket of fluid in the LEFT chest. The area was then prepped and draped in the normal sterile fashion. 1% Lidocaine was used for local anesthesia. Under ultrasound guidance a 6 Fr Safe-T-Centesis catheter was introduced. Thoracentesis was performed. The catheter was removed and a dressing applied. FINDINGS: A total of approximately 420 mL of hazy, yellow fluid was removed. Samples were sent to the laboratory as requested by the clinical team. IMPRESSION: Successful ultrasound guided diagnostic and therapeutic LEFT thoracentesis yielding 420 mL of pleural fluid. Read by: Rowe Robert, PA-C Electronically Signed   By: Michaelle Birks M.D.   On: 11/03/2022 15:26   DG CHEST PORT 1 VIEW  Result Date: 11/03/2022 CLINICAL DATA:  Thoracentesis EXAM: PORTABLE CHEST 1 VIEW COMPARISON:  Chest radiograph 1  day prior FINDINGS: The cardiomediastinal silhouette is stable. The left pleural effusion has decreased in size following thoracentesis with improved aeration of the left lung base. There is no appreciable pneumothorax The right lung is clear. There is no right pleural effusion or pneumothorax There is no acute osseous abnormality. IMPRESSION: Decreased left pleural effusion with improved aeration of the left lung following thoracentesis. No appreciable pneumothorax. Electronically Signed   By: Valetta Mole M.D.   On: 11/03/2022 14:56    Scheduled Meds:  lip balm   Topical BID   pantoprazole (PROTONIX) IV  40 mg Intravenous Q12H   polycarbophil  625 mg Oral BID  polyethylene glycol  17 g Oral Daily   Continuous Infusions:  cefTRIAXone (ROCEPHIN)  IV 2 g (11/04/22 0814)   methocarbamol (ROBAXIN) IV     ondansetron (ZOFRAN) IV       LOS: 5 days   Shelly Coss, MD [facility] P2/27/2024, 2:03 PM

## 2022-11-04 NOTE — Progress Notes (Signed)
Patient ID: Jared Duran, male   DOB: Jul 25, 1951, 72 y.o.   MRN: [id number] [facility] Progress Note     Subjective: CC-  Complaining of more abdominal pain today. Pain is diffuse but he points to epigastric region as to where majority of his pain is located. Some nausea, no emesis. States that he is passing flatus and had a BM yesterday. States that he is tolerating liquids but unsure how much he is taking in.  Objective: Vital signs in last 24 hours: Temp:  [97.8 F (36.6 C)-98.9 F (37.2 C)] 97.8 F (36.6 C) (02/27 0613) Pulse Rate:  [85-94] 94 (02/27 0613) Resp:  [16-29] 16 (02/27 0613) BP: (113-124)/(70-82) 119/70 (02/27 0613) SpO2:  [95 %-100 %] 95 % (02/27 0613) Last BM Date : 11/02/22  Intake/Output from previous day: 02/26 0701 - 02/27 0700 In: 365.2 [I.V.:265.2; IV Piggyback:100] Out: 475 [Urine:475] Intake/Output this shift: No intake/output data recorded.  PE: Gen:  Alert, NAD, chronically ill appearing Card:  RRR Pulm:  Normal effort on [place] Abd: moderate distention but soft, tympanic, diffuse tenderness with moderate RUQ/epigastric tenderness with guarding  Lab Results:  Recent Labs    11/03/22 0259 11/04/22 0622  WBC 14.2* 21.0*  HGB 7.5* 9.0*  HCT 23.0* 28.1*  PLT 247 294   BMET Recent Labs    11/03/22 0259 11/04/22 0622  NA 130* 132*  K 3.6 3.6  CL 102 103  CO2 23 22  GLUCOSE 110* 118*  BUN 18 14  CREATININE 0.96 0.96  CALCIUM 7.5* 7.9*   PT/INR No results for input(s): "LABPROT", "INR" in the last 72 hours. CMP     Component Value Date/Time   NA 132 (L) 11/04/2022 0622   K 3.6 11/04/2022 0622   CL 103 11/04/2022 0622   CO2 22 11/04/2022 0622   GLUCOSE 118 (H) 11/04/2022 0622   BUN 14 11/04/2022 0622   CREATININE 0.96 11/04/2022 0622   CALCIUM 7.9 (L) 11/04/2022 0622   PROT 5.9 (L) 11/04/2022 0622   ALBUMIN 2.0 (L) 11/04/2022 0622   AST 18 11/04/2022 0622   ALT 14 11/04/2022 0622   ALKPHOS 86 11/04/2022 0622    BILITOT 2.4 (H) 11/04/2022 0622   GFRNONAA >60 11/04/2022 0622   GFRAA >60 04/11/2020 0523   Lipase     Component Value Date/Time   LIPASE 167 (H) 11/02/2022 0643       Studies/Results: DG Abd Portable 1V  Result Date: 11/04/2022 CLINICAL DATA:  Abdominal pain. EXAM: PORTABLE ABDOMEN - 1 VIEW COMPARISON:  Radiographs 10/14/2018.  CT 11/01/2022. FINDINGS: 0756 hours. Single supine view of the abdomen excludes the upper abdomen. The bowel gas pattern is nonobstructive. There is residual contrast material within the colon near the splenic flexure. A small amount of contrast is present in the urinary bladder. No suspicious abdominal calcifications. The bones appear unchanged. IMPRESSION: No radiographic evidence of acute abdominal process. See recent CT report. Electronically Signed   By: Richardean Sale M.D.   On: 11/04/2022 08:08   US THORACENTESIS ASP PLEURAL SPACE W/IMG GUIDE  Result Date: 11/03/2022 INDICATION: Patient with history of chronic pancreatitis, recent splenic hemorrhage /embolization of splenic artery, left pleural effusion; request received for diagnostic and therapeutic left thoracentesis EXAM: ULTRASOUND GUIDED DIAGNOSTIC AND THERAPEUTIC LEFT THORACENTESIS MEDICATIONS: 8 ml 1% lidocaine COMPLICATIONS: None immediate. PROCEDURE: An ultrasound guided thoracentesis was thoroughly discussed with the patient and questions answered. The benefits, risks, alternatives and complications were also discussed. The patient understands and wishes to proceed  with the procedure. Written consent was obtained. Ultrasound was performed to localize and mark an adequate pocket of fluid in the LEFT chest. The area was then prepped and draped in the normal sterile fashion. 1% Lidocaine was used for local anesthesia. Under ultrasound guidance a 6 Fr Safe-T-Centesis catheter was introduced. Thoracentesis was performed. The catheter was removed and a dressing applied. FINDINGS: A total of approximately 420  mL of hazy, yellow fluid was removed. Samples were sent to the laboratory as requested by the clinical team. IMPRESSION: Successful ultrasound guided diagnostic and therapeutic LEFT thoracentesis yielding 420 mL of pleural fluid. Read by: Rowe Robert, PA-C Electronically Signed   By: Michaelle Birks M.D.   On: 11/03/2022 15:26   DG CHEST PORT 1 VIEW  Result Date: 11/03/2022 CLINICAL DATA:  Thoracentesis EXAM: PORTABLE CHEST 1 VIEW COMPARISON:  Chest radiograph 1 day prior FINDINGS: The cardiomediastinal silhouette is stable. The left pleural effusion has decreased in size following thoracentesis with improved aeration of the left lung base. There is no appreciable pneumothorax The right lung is clear. There is no right pleural effusion or pneumothorax There is no acute osseous abnormality. IMPRESSION: Decreased left pleural effusion with improved aeration of the left lung following thoracentesis. No appreciable pneumothorax. Electronically Signed   By: Valetta Mole M.D.   On: 11/03/2022 14:56   DG CHEST PORT 1 VIEW  Result Date: 11/02/2022 CLINICAL DATA:  Shortness of breath. Congestive cough. EXAM: PORTABLE CHEST 1 VIEW COMPARISON:  Radiograph 10/30/2022, chest CT 10/19/2022. Lung bases from abdominal CT earlier today FINDINGS: Increasing left pleural effusion with hazy opacity overlying the left lower lung zone. Grossly stable heart size and mediastinal contours. Slight increase in right infrahilar atelectasis. No pneumothorax. No pulmonary edema. IMPRESSION: Increasing left pleural effusion with hazy opacity overlying the left lower lung zone. Increasing right infrahilar atelectasis. Electronically Signed   By: Keith Rake M.D.   On: 11/02/2022 15:19    Anti-infectives: Anti-infectives (From admission, onward)    Start     Dose/Rate Route Frequency Ordered Stop   11/01/22 0900  cefTRIAXone (ROCEPHIN) 2 g in sodium chloride 0.9 % 100 mL IVPB        2 g 200 mL/hr over 30 Minutes Intravenous Every  24 hours 11/01/22 0750     10/31/22 1100  ceFAZolin (ANCEF) IVPB 2g/100 mL premix        2 g 200 mL/hr over 30 Minutes Intravenous  Once 10/31/22 1059 10/31/22 1230        Assessment/Plan Splenic hemorrhage s/p IR embolization of main splenic artery 2/23 - WBC up to 21, afebrile, VSS. Leukocytosis could be reactive from splenic embolization but he is having increased abdominal pain/tenderness today; pain is diffuse but worst in the RUQ/epigastric region. Hgb 9 from 7.5 and stable. Will check and film and lipase today. Continue FLD for now - mobilize, OOB    FEN: FLD ID: ceftriaxone 2/24 >> no abx needed from a surgical standpoint VTE: SCD's, chemical VTE held due ABL anemia requiring transfusion.   ABL anemia - s/p 3 u pRBC 2/23 Acute on chronic pancreatitis related to EtOh abuse - lipase downtrending a couple days ago, recheck today Pleural effusion s/p thoracentesis 2/26   I reviewed hospitalist notes, last 24 h vitals and pain scores, last 48 h intake and output, and last 24 h labs and trends.    LOS: 5 days    [facility] 11/04/2022, 8:27 AM Please see Amion for  pager number during day hours 7:00am-4:30pm

## 2022-11-04 NOTE — Progress Notes (Signed)
Patient continues to report left lower ribcage pain 8/10. Dr Tawanna Solo notified. New orders noted.

## 2022-11-05 DIAGNOSIS — D735 Infarction of spleen: Secondary | ICD-10-CM | POA: Diagnosis not present

## 2022-11-05 LAB — CBC
HCT: 25.5 % — ABNORMAL LOW (ref 39.0–52.0)
Hemoglobin: 8.3 g/dL — ABNORMAL LOW (ref 13.0–17.0)
MCH: 31.7 pg (ref 26.0–34.0)
MCHC: 32.5 g/dL (ref 30.0–36.0)
MCV: 97.3 fL (ref 80.0–100.0)
Platelets: 285 10*3/uL (ref 150–400)
RBC: 2.62 MIL/uL — ABNORMAL LOW (ref 4.22–5.81)
RDW: 16.2 % — ABNORMAL HIGH (ref 11.5–15.5)
WBC: 24.1 10*3/uL — ABNORMAL HIGH (ref 4.0–10.5)
nRBC: 0 % (ref 0.0–0.2)

## 2022-11-05 LAB — BASIC METABOLIC PANEL
Anion gap: 5 (ref 5–15)
BUN: 16 mg/dL (ref 8–23)
CO2: 24 mmol/L (ref 22–32)
Calcium: 7.9 mg/dL — ABNORMAL LOW (ref 8.9–10.3)
Chloride: 104 mmol/L (ref 98–111)
Creatinine, Ser: 0.97 mg/dL (ref 0.61–1.24)
GFR, Estimated: >60 mL/min (ref >60)
Glucose, Bld: 113 mg/dL — ABNORMAL HIGH (ref 70–99)
Potassium: 3.9 mmol/L (ref 3.5–5.1)
Sodium: 133 mmol/L — ABNORMAL LOW (ref 135–145)

## 2022-11-05 MED ORDER — BISACODYL 10 MG RE SUPP: 10.0000 mg | Freq: Once | RECTAL | Status: DC | PRN

## 2022-11-05 MED ORDER — OXYCODONE HCL 5 MG PO TABS
5.0000 mg | ORAL_TABLET | ORAL | Status: DC | PRN
  Administered 2022-11-05 – 2022-11-06 (×2): 5 mg via ORAL
  Filled 2022-11-05 (×3): qty 1

## 2022-11-05 MED ORDER — MAGNESIUM CITRATE PO SOLN: 1.0000 | Freq: Once | ORAL | Status: DC

## 2022-11-05 MED ORDER — ENSURE ENLIVE PO LIQD
237.0000 mL | Freq: Two times a day (BID) | ORAL | Status: DC
  Administered 2022-11-05 – 2022-11-06 (×3): 237 mL via ORAL

## 2022-11-05 MED ORDER — MAGNESIUM CITRATE PO SOLN
0.5000 | Freq: Once | ORAL | Status: AC
  Administered 2022-11-05: 0.5 via ORAL
  Filled 2022-11-05: qty 296

## 2022-11-05 NOTE — Care Management Important Message (Signed)
Important Message  Patient Details IM Letter given. Name: Jared Duran MRN: [id number] Date of Birth: May 09, 1951   Medicare Important Message Given:  Yes     Kerin Salen 11/05/2022, 9:24 AM

## 2022-11-05 NOTE — Progress Notes (Signed)
PT Cancellation Note  Patient Details Name: Jared Duran MRN: [id number] DOB: May 27, 1951   Cancelled Treatment:    Reason Eval/Treat Not Completed: Patient declined, no reason specified. Pt politely declines PT, reports he just laid down after sitting up in recliner for 6 hours. Will continue to follow.    Talbot Grumbling PT, DPT 11/05/22, 11:27 AM

## 2022-11-05 NOTE — Progress Notes (Signed)
Patient ID: Jared Duran, male   DOB: 13-Oct-1950, 72 y.o.   MRN: [id number] [facility] Surgery Progress Note     Subjective: CC-  Up in chair, daughter at bedside. Feeling better this morning. States that he had some increased abdominal pain yesterday after eating solid food, he backed down to more liquids over night and is feeling better. Denies any n/v. Passing flatus, no BM yesterday.  Objective: Vital signs in last 24 hours: Temp:  [97.8 F (36.6 C)-98.3 F (36.8 C)] 97.8 F (36.6 C) (02/28 0812) Pulse Rate:  [82-105] 96 (02/28 0812) Resp:  [15-24] 18 (02/28 0454) BP: (98-131)/(77-85) 112/84 (02/28 0812) SpO2:  [92 %-100 %] 97 % (02/28 0812) Last BM Date : 11/04/22 (Per patient)  Intake/Output from previous day: 02/27 0701 - 02/28 0700 In: 477 [P.O.:477] Out: 300 [Urine:300] Intake/Output this shift: No intake/output data recorded.  PE: Gen:  Alert, NAD, chronically ill appearing Card:  RRR Pulm:  Normal effort Abd: distended but soft, mild diffuse tenderness without rebound or guarding  Lab Results:  Recent Labs    11/04/22 0622 11/05/22 0557  WBC 21.0* 24.1*  HGB 9.0* 8.3*  HCT 28.1* 25.5*  PLT 294 285   BMET Recent Labs    11/04/22 0622 11/05/22 0557  NA 132* 133*  K 3.6 3.9  CL 103 104  CO2 22 24  GLUCOSE 118* 113*  BUN 14 16  CREATININE 0.96 0.97  CALCIUM 7.9* 7.9*   PT/INR No results for input(s): "LABPROT", "INR" in the last 72 hours. CMP     Component Value Date/Time   NA 133 (L) 11/05/2022 0557   K 3.9 11/05/2022 0557   CL 104 11/05/2022 0557   CO2 24 11/05/2022 0557   GLUCOSE 113 (H) 11/05/2022 0557   BUN 16 11/05/2022 0557   CREATININE 0.97 11/05/2022 0557   CALCIUM 7.9 (L) 11/05/2022 0557   PROT 5.9 (L) 11/04/2022 0622   ALBUMIN 2.0 (L) 11/04/2022 0622   AST 18 11/04/2022 0622   ALT 14 11/04/2022 0622   ALKPHOS 86 11/04/2022 0622   BILITOT 2.4 (H) 11/04/2022 0622   GFRNONAA >60 11/05/2022 0557   GFRAA >60  04/11/2020 0523   Lipase     Component Value Date/Time   LIPASE 168 (H) 11/04/2022 0622       Studies/Results: DG Abd Portable 1V  Result Date: 11/04/2022 CLINICAL DATA:  Abdominal pain. EXAM: PORTABLE ABDOMEN - 1 VIEW COMPARISON:  Radiographs 10/14/2018.  CT 11/01/2022. FINDINGS: 0756 hours. Single supine view of the abdomen excludes the upper abdomen. The bowel gas pattern is nonobstructive. There is residual contrast material within the colon near the splenic flexure. A small amount of contrast is present in the urinary bladder. No suspicious abdominal calcifications. The bones appear unchanged. IMPRESSION: No radiographic evidence of acute abdominal process. See recent CT report. Electronically Signed   By: Richardean Sale M.D.   On: 11/04/2022 08:08   US THORACENTESIS ASP PLEURAL SPACE W/IMG GUIDE  Result Date: 11/03/2022 INDICATION: Patient with history of chronic pancreatitis, recent splenic hemorrhage /embolization of splenic artery, left pleural effusion; request received for diagnostic and therapeutic left thoracentesis EXAM: ULTRASOUND GUIDED DIAGNOSTIC AND THERAPEUTIC LEFT THORACENTESIS MEDICATIONS: 8 ml 1% lidocaine COMPLICATIONS: None immediate. PROCEDURE: An ultrasound guided thoracentesis was thoroughly discussed with the patient and questions answered. The benefits, risks, alternatives and complications were also discussed. The patient understands and wishes to proceed with the procedure. Written consent was obtained. Ultrasound was performed to localize and mark an  adequate pocket of fluid in the LEFT chest. The area was then prepped and draped in the normal sterile fashion. 1% Lidocaine was used for local anesthesia. Under ultrasound guidance a 6 Fr Safe-T-Centesis catheter was introduced. Thoracentesis was performed. The catheter was removed and a dressing applied. FINDINGS: A total of approximately 420 mL of hazy, yellow fluid was removed. Samples were sent to the laboratory as  requested by the clinical team. IMPRESSION: Successful ultrasound guided diagnostic and therapeutic LEFT thoracentesis yielding 420 mL of pleural fluid. Read by: Rowe Robert, PA-C Electronically Signed   By: Michaelle Birks M.D.   On: 11/03/2022 15:26   DG CHEST PORT 1 VIEW  Result Date: 11/03/2022 CLINICAL DATA:  Thoracentesis EXAM: PORTABLE CHEST 1 VIEW COMPARISON:  Chest radiograph 1 day prior FINDINGS: The cardiomediastinal silhouette is stable. The left pleural effusion has decreased in size following thoracentesis with improved aeration of the left lung base. There is no appreciable pneumothorax The right lung is clear. There is no right pleural effusion or pneumothorax There is no acute osseous abnormality. IMPRESSION: Decreased left pleural effusion with improved aeration of the left lung following thoracentesis. No appreciable pneumothorax. Electronically Signed   By: Valetta Mole M.D.   On: 11/03/2022 14:56    Anti-infectives: Anti-infectives (From admission, onward)    Start     Dose/Rate Route Frequency Ordered Stop   11/01/22 0900  cefTRIAXone (ROCEPHIN) 2 g in sodium chloride 0.9 % 100 mL IVPB        2 g 200 mL/hr over 30 Minutes Intravenous Every 24 hours 11/01/22 0750     10/31/22 1100  ceFAZolin (ANCEF) IVPB 2g/100 mL premix        2 g 200 mL/hr over 30 Minutes Intravenous  Once 10/31/22 1059 10/31/22 1230        Assessment/Plan Splenic hemorrhage s/p IR embolization of main splenic artery 2/23 - WBC up to 24.1, afebrile, VSS. Leukocytosis likely reactive from splenic embolization. Hgb has stabilized. Abdominal pain improved today but he has not had a good bowel movement. Will order 1/2 bottle mag citrate. If patient having bowel function, tolerating diet, and pain improved he would be ok for discharge from surgical standpoint. 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    LOS: 6 days    [facility] 11/05/2022, 8:21 AM Please see Amion for pager number during day hours 7:00am-4:30pm

## 2022-11-05 NOTE — Progress Notes (Signed)
PT Cancellation Note  Patient Details Name: Jared Duran MRN: [id number] DOB: 12-10-1950   Cancelled Treatment:    Reason Eval/Treat Not Completed: Patient declined, no reason specified--2nd attempt    Doreatha Massed, PT Acute Rehabilitation  Office: [phone]

## 2022-11-05 NOTE — Progress Notes (Signed)
PROGRESS NOTE    Jared Duran  [id number] DOB: [birth year] DOA: 10/30/2022 PCP: [name], MD    Brief Narrative:  72 year old male with history of chronic pancreatitis related to alcohol, recently admitted for acute pancreatitis discharge last week presented back to the emergency department after syncopal episode. He was complaining of persistent left upper quadrant pain for couple of days, went to the bathroom, felt lightheaded and passed out.On presentation his blood pressure was soft, hemoglobin was 6.1. CT imaging showed features concerning for splenic hemorrhage . General surgery, GI, IR consulted.  Transfused with 2 units of PRBC.  Underwent splenic arteriography and embolization.  Hospital course remarkable for persistent abdominal pain. Worsening leucocytosis .   Assessment & Plan:   Spontaneous splenic hemorrhage: CT scan showed splenic hemorrhage and hematoma with parenchymal destruction.  Underwent asplenic arteriography and embolization.  Hemodynamically stabilized since then. Patient has persistent abdominal pain which is likely due to chronic pancreatitis and acute inflammatory reaction. CT scan done repeat on 2/24 showed a stable complex splenic laceration and perisplenic hematoma. Patient received total 2 units of PRBC and hemoglobin has remained stable.  Continue monitoring.  Recheck tomorrow morning. Abdominal pain remains major issue.  Titrate off to oral pain medication along with bowel regimen.  Leukocytosis: Likely reactive.  Monitor without antibiotics.  Left-sided pleural effusion/acute hypoxemic respiratory failure: Initially on 4 L oxygen.  Now on room air.  Moderate pleural effusion on the left side.  Thoracentesis 2/26 with 420 cc of hazy yellow fluid removal. Looks like patient had a cytology and Gram stain but no cultures.  Improving on room air today.  Acute blood loss anemia: As above.  Received total 2 units of PRBC.  Currently hemoglobin is  stable.    DVT prophylaxis: SCDs Start: 10/31/22 0244   Code Status: Full code Family Communication: Sister and daughter at the bedside. Disposition Plan: Status is: Inpatient Remains inpatient appropriate because: Significant abdominal pain, IV pain medications, leukocytosis     Consultants:  GI IR General surgery  Procedures:  Spleen embolization.  Antimicrobials:  None   Subjective: Patient seen in the morning rounds.  Daughter and sister were at the bedside.  Patient has poor appetite.  Wants to try some nutritional supplements. Does have diffuse abdominal pain more on the upper abdomen and using frequent Dilaudid.  Agreeable to use oral pain medications.  Objective: Vitals:   11/04/22 1956 11/05/22 0003 11/05/22 0454 11/05/22 0812  BP: 128/83 131/85 111/82 112/84  Pulse: 91 90 96 96  Resp: '18 18 18   '$ Temp: 98.3 F (36.8 C) 98 F (36.7 C) 98.1 F (36.7 C) 97.8 F (36.6 C)  TempSrc: Oral Oral Oral Oral  SpO2: 99% 99% 92% 97%  Weight:      Height:        Intake/Output Summary (Last 24 hours) at 11/05/2022 1306 Last data filed at 11/05/2022 1100 Gross per 24 hour  Intake 357 ml  Output --  Net 357 ml   Filed Weights   10/31/22 1300 11/02/22 0600 11/03/22 0530  Weight: 55.5 kg 58.4 kg 58.7 kg    Examination:  General exam: Appears calm and comfortable , sitting in chair.  On room air. Respiratory system: Clear to auscultation. Respiratory effort normal. Cardiovascular system: S1 & S2 heard, RRR. Marland Kitchen Gastrointestinal system: Soft.  Mild diffuse tenderness on deep palpation in the epigastrium and upper abdomen.  No rigidity or guarding.  Bowel sound present. Central nervous system: Alert and oriented. No focal neurological  deficits. Extremities: Symmetric 5 x 5 power. Skin: No rashes, lesions or ulcers Psychiatry: Judgement and insight appear normal. Mood & affect appropriate.     Data Reviewed: I have personally reviewed following labs and imaging  studies  CBC: Recent Labs  Lab 10/30/22 1819 10/31/22 0516 11/01/22 1630 11/02/22 0643 11/03/22 0259 11/04/22 0622 11/05/22 0557  WBC 19.4*   < > 15.2* 14.0* 14.2* 21.0* 24.1*  NEUTROABS 15.8*  --   --   --   --   --   --   HGB 6.1*   < > 8.0* 8.1* 7.5* 9.0* 8.3*  HCT 18.2*   < > 24.2* 24.7* 23.0* 28.1* 25.5*  MCV 100.6*   < > 95.3 96.1 96.6 97.9 97.3  PLT 354   < > 213 245 247 294 285   < > = values in this interval not displayed.   Basic Metabolic Panel: Recent Labs  Lab 11/01/22 0616 11/02/22 0643 11/03/22 0259 11/04/22 0622 11/05/22 0557  NA 135 131* 130* 132* 133*  K 3.8 3.7 3.6 3.6 3.9  CL 101 99 102 103 104  CO2 '24 23 23 22 24  '$ GLUCOSE 103* 100* 110* 118* 113*  BUN '23 21 18 14 16  '$ CREATININE 1.22 0.97 0.96 0.96 0.97  CALCIUM 7.8* 8.0* 7.5* 7.9* 7.9*   GFR: Estimated Creatinine Clearance: 59.7 mL/min (by C-G formula based on SCr of 0.97 mg/dL). Liver Function Tests: Recent Labs  Lab 10/30/22 1819 10/31/22 1535 11/01/22 0616 11/02/22 0643 11/04/22 0622  AST 55* '24 18 17 18  '$ ALT 59* 38 '27 21 14  '$ ALKPHOS 164* 139* 113 101 86  BILITOT 4.1* 3.1* 3.1* 2.9* 2.4*  PROT 6.5 6.2* 5.5* 5.6* 5.9*  ALBUMIN 2.4* 2.1* 2.0* 1.9* 2.0*   Recent Labs  Lab 10/30/22 1819 11/01/22 0616 11/02/22 0643 11/04/22 0622  LIPASE 276* 120* 167* 168*   No results for input(s): "AMMONIA" in the last 168 hours. Coagulation Profile: Recent Labs  Lab 10/30/22 2311  INR 1.2   Cardiac Enzymes: No results for input(s): "CKTOTAL", "CKMB", "CKMBINDEX", "TROPONINI" in the last 168 hours. BNP (last 3 results) No results for input(s): "PROBNP" in the last 8760 hours. HbA1C: No results for input(s): "HGBA1C" in the last 72 hours. CBG: Recent Labs  Lab 11/03/22 0619 11/03/22 1327 11/03/22 1807 11/03/22 2341 11/04/22 0620  GLUCAP 113* 124* 174* 136* 132*   Lipid Profile: No results for input(s): "CHOL", "HDL", "LDLCALC", "TRIG", "CHOLHDL", "LDLDIRECT" in the last 72  hours. Thyroid Function Tests: No results for input(s): "TSH", "T4TOTAL", "FREET4", "T3FREE", "THYROIDAB" in the last 72 hours. Anemia Panel: No results for input(s): "VITAMINB12", "FOLATE", "FERRITIN", "TIBC", "IRON", "RETICCTPCT" in the last 72 hours. Sepsis Labs: Recent Labs  Lab 11/04/22 0622  PROCALCITON 2.59    Recent Results (from the past 240 hour(s))  MRSA Next Gen by PCR, Nasal     Status: None   Collection Time: 10/31/22  1:00 PM   Specimen: Nasal Mucosa; Nasal Swab  Result Value Ref Range Status   MRSA by PCR Next Gen NOT DETECTED NOT DETECTED Final    Comment: (NOTE) The GeneXpert MRSA Assay (FDA approved for NASAL specimens only), is one component of a comprehensive MRSA colonization surveillance program. It is not intended to diagnose MRSA infection nor to guide or monitor treatment for MRSA infections. Test performance is not FDA approved in patients less than 81 years old. Performed at [facility], [place] [address], [place], [place] [address]  Gram stain     Status: None   Collection Time: 11/03/22  2:30 PM   Specimen: PATH Cytology Pleural fluid  Result Value Ref Range Status   Specimen Description   Final    PLEURAL Performed at [place] [address] [place], [place] [address]    Special Requests   Final    NONE Performed at [facility], [place] [address] [place], [place] [address]    Gram Stain   Final    CYTOSPIN SMEAR WBC PRESENT, PREDOMINANTLY PMN NO ORGANISMS SEEN Performed at [facility], [place] [address] [place], [place] [address]    Report Status 11/03/2022 FINAL  Final         Radiology Studies: DG Abd Portable 1V  Result Date: 11/04/2022 CLINICAL DATA:  Abdominal pain. EXAM: PORTABLE ABDOMEN - 1 VIEW COMPARISON:  Radiographs 10/14/2018.  CT 11/01/2022. FINDINGS: 0756 hours. Single supine view of the abdomen excludes the upper abdomen. The bowel gas pattern is  nonobstructive. There is residual contrast material within the colon near the splenic flexure. A small amount of contrast is present in the urinary bladder. No suspicious abdominal calcifications. The bones appear unchanged. IMPRESSION: No radiographic evidence of acute abdominal process. See recent CT report. Electronically Signed   By: Richardean Sale M.D.   On: 11/04/2022 08:08   US THORACENTESIS ASP PLEURAL SPACE W/IMG GUIDE  Result Date: 11/03/2022 INDICATION: Patient with history of chronic pancreatitis, recent splenic hemorrhage /embolization of splenic artery, left pleural effusion; request received for diagnostic and therapeutic left thoracentesis EXAM: ULTRASOUND GUIDED DIAGNOSTIC AND THERAPEUTIC LEFT THORACENTESIS MEDICATIONS: 8 ml 1% lidocaine COMPLICATIONS: None immediate. PROCEDURE: An ultrasound guided thoracentesis was thoroughly discussed with the patient and questions answered. The benefits, risks, alternatives and complications were also discussed. The patient understands and wishes to proceed with the procedure. Written consent was obtained. Ultrasound was performed to localize and mark an adequate pocket of fluid in the LEFT chest. The area was then prepped and draped in the normal sterile fashion. 1% Lidocaine was used for local anesthesia. Under ultrasound guidance a 6 Fr Safe-T-Centesis catheter was introduced. Thoracentesis was performed. The catheter was removed and a dressing applied. FINDINGS: A total of approximately 420 mL of hazy, yellow fluid was removed. Samples were sent to the laboratory as requested by the clinical team. IMPRESSION: Successful ultrasound guided diagnostic and therapeutic LEFT thoracentesis yielding 420 mL of pleural fluid. Read by: Rowe Robert, PA-C Electronically Signed   By: Michaelle Birks M.D.   On: 11/03/2022 15:26   DG CHEST PORT 1 VIEW  Result Date: 11/03/2022 CLINICAL DATA:  Thoracentesis EXAM: PORTABLE CHEST 1 VIEW COMPARISON:  Chest radiograph 1  day prior FINDINGS: The cardiomediastinal silhouette is stable. The left pleural effusion has decreased in size following thoracentesis with improved aeration of the left lung base. There is no appreciable pneumothorax The right lung is clear. There is no right pleural effusion or pneumothorax There is no acute osseous abnormality. IMPRESSION: Decreased left pleural effusion with improved aeration of the left lung following thoracentesis. No appreciable pneumothorax. Electronically Signed   By: Valetta Mole M.D.   On: 11/03/2022 14:56        Scheduled Meds:  feeding supplement  237 mL Oral BID BM   lip balm   Topical BID   pantoprazole (PROTONIX) IV  40 mg Intravenous Q12H   polycarbophil  625 mg Oral BID   polyethylene glycol  17 g Oral Daily  Continuous Infusions:  methocarbamol (ROBAXIN) IV     ondansetron (ZOFRAN) IV       LOS: 6 days    Time spent: 35 minutes    [name], MD [facility] Pager [phone]

## 2022-11-06 DIAGNOSIS — D735 Infarction of spleen: Secondary | ICD-10-CM | POA: Diagnosis not present

## 2022-11-06 LAB — CBC WITH DIFFERENTIAL/PLATELET
Abs Immature Granulocytes: 0.58 10*3/uL — ABNORMAL HIGH (ref 0.00–0.07)
Basophils Absolute: 0.1 10*3/uL (ref 0.0–0.1)
Basophils Relative: 0 %
Eosinophils Absolute: 0.1 10*3/uL (ref 0.0–0.5)
Eosinophils Relative: 0 %
HCT: 27.7 % — ABNORMAL LOW (ref 39.0–52.0)
Hemoglobin: 8.8 g/dL — ABNORMAL LOW (ref 13.0–17.0)
Immature Granulocytes: 2 %
Lymphocytes Relative: 5 %
Lymphs Abs: 1.2 10*3/uL (ref 0.7–4.0)
MCH: 31.1 pg (ref 26.0–34.0)
MCHC: 31.8 g/dL (ref 30.0–36.0)
MCV: 97.9 fL (ref 80.0–100.0)
Monocytes Absolute: 2.1 10*3/uL — ABNORMAL HIGH (ref 0.1–1.0)
Monocytes Relative: 8 %
Neutro Abs: 20.8 10*3/uL — ABNORMAL HIGH (ref 1.7–7.7)
Neutrophils Relative %: 85 %
Platelets: 353 10*3/uL (ref 150–400)
RBC: 2.83 MIL/uL — ABNORMAL LOW (ref 4.22–5.81)
RDW: 16.1 % — ABNORMAL HIGH (ref 11.5–15.5)
WBC: 24.8 10*3/uL — ABNORMAL HIGH (ref 4.0–10.5)
nRBC: 0 % (ref 0.0–0.2)

## 2022-11-06 LAB — CYTOLOGY - NON PAP

## 2022-11-06 MED ORDER — POLYETHYLENE GLYCOL 3350 17 G PO PACK: 17.0000 g | PACK | Freq: Every day | ORAL | 0 refills | Status: DC | PRN

## 2022-11-06 MED ORDER — ENSURE ENLIVE PO LIQD: 237.0000 mL | Freq: Two times a day (BID) | ORAL | 0 refills | Status: DC

## 2022-11-06 MED ORDER — CALCIUM POLYCARBOPHIL 625 MG PO TABS: 625.0000 mg | ORAL_TABLET | Freq: Two times a day (BID) | ORAL | 0 refills | Status: DC

## 2022-11-06 MED ORDER — OXYCODONE HCL 5 MG PO TABS: 5.0000 mg | ORAL_TABLET | Freq: Four times a day (QID) | ORAL | 0 refills | Status: DC | PRN

## 2022-11-06 MED ORDER — ACETAMINOPHEN 325 MG PO TABS: 325.0000 mg | ORAL_TABLET | Freq: Four times a day (QID) | ORAL | Status: DC | PRN

## 2022-11-06 NOTE — Discharge Summary (Signed)
Physician Discharge Summary  Jared Duran [id number] DOB: January 03, 1951 DOA: 10/30/2022  PCP: [name], MD  Admit date: 10/30/2022 Discharge date: 11/06/2022  Admitted From: Home Disposition: Home  Recommendations for Outpatient Follow-up:  Follow up with PCP in 1-2 weeks Please obtain BMP/CBC in one week   Home Health: N/A Equipment/Devices: N/A  Discharge Condition: Stable CODE STATUS: Full code Diet recommendation: Low-salt diet, plenty of oral hydration  Discharge summary: 72 year old male with history of chronic pancreatitis related to alcohol, recently admitted for acute pancreatitis and discharged last week presented back to the emergency department after syncopal episode. He was complaining of persistent left upper quadrant pain for couple of days, went to the bathroom, felt lightheaded and passed out.On presentation his blood pressure was soft, hemoglobin was 6.1. CT imaging showed features concerning for splenic hemorrhage . General surgery, GI, IR consulted.  Transfused with 2 units of PRBC.  Underwent splenic arteriography and embolization.  Hospital course remarkable for persistent abdominal pain. Worsening leucocytosis . Leukocyte remains high.  Pain is controlled with oral pain medications now.  Completed antibiotic therapy.  Clinically improved and wants to go home today.     Assessment & plan of care:   Spontaneous splenic hemorrhage: In a patient with chronic recurrent pancreatitis. CT scan showed splenic hemorrhage and hematoma with parenchymal destruction.  Underwent a splenic arteriography and embolization.  Hemodynamically stabilized since then. Patient had persistent abdominal pain which is likely due to chronic pancreatitis and acute inflammatory reaction. CT scan done repeat on 2/24 showed a stable complex splenic laceration and perisplenic hematoma. Patient received total 2 units of PRBC and hemoglobin has remained stable.   Patient was able to  maintain pain control on oral pain medications today.   Followed by surgery.  Surgically stable as per surgery.   Leukocytosis: Likely reactive.  Persistent WBC elevation likely secondary to leukemoid reaction.  Multiple blood cultures and infection workup negative.  Since he is clinically stable, able to go home without antibiotics.  Will need to recheck blood counts in 1 week through primary care physician.  This was relayed with patient and daughter.   Left-sided pleural effusion/acute hypoxemic respiratory failure: Initially on 4 L oxygen.  Now on room air.  Moderate pleural effusion on the left side.  Thoracentesis 2/26 with 420 cc of hazy yellow fluid removal.  Cytology and Gram stain were sent but no cultures were sent.  He is asymptomatic today.   Acute blood loss anemia: As above.  Received total 2 units of PRBC.  Currently hemoglobin is stable.  Medically stabilized.  Pain is controlled on oral pain medications.  WBC persistently elevated but asymptomatic.  Discussed with patient and family whether he wants to stay in the hospital to monitor his white count at least through tomorrow, however he wants to go home and check his blood count with his primary care physician through [facility].  Since he was medically stable discharged with scheduled outpatient follow-up with his PCP. Patient will need a short course of opiates for pain relief.  Limited supplies were prescribed.  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 Also will need repeat blood test   Increase activity slowly   Complete by: As directed    No wound care   Complete by: As directed       Allergies as of 11/06/2022       Reactions   Ethanol Other (See Comments)   Severe pancreatitis & liver disease = NO ALCOHOL        Medication List     STOP taking these medications    losartan 100 MG tablet Commonly known as: COZAAR   sertraline 25 MG tablet Commonly known as: ZOLOFT       TAKE these medications    acetaminophen 325 MG tablet Commonly known as: TYLENOL Take 1-2 tablets (325-650 mg total) by mouth every 6 (six) hours as needed for mild pain, fever or headache.   feeding supplement Liqd Take 237 mLs by mouth 2 (two) times daily between meals.   hydrOXYzine 25 MG tablet Commonly known as: ATARAX Take 25 mg by mouth 3 (three) times daily.   NIFEdipine 90 MG 24 hr tablet Commonly known as: ADALAT CC Take 90 mg by mouth daily.   oxyCODONE 5 MG immediate release tablet Commonly known as: Roxicodone Take 1 tablet (5 mg total) by mouth every 6 (six) hours as needed for up to 5 days for moderate pain or severe pain. What changed: reasons to take this   pantoprazole 20 MG tablet Commonly known as: PROTONIX Take 20 mg by mouth daily.   polycarbophil 625 MG tablet Commonly known as: FIBERCON Take 1 tablet (625 mg total) by mouth 2 (two) times daily.   polyethylene glycol 17 g packet Commonly known as: MIRALAX / GLYCOLAX Take 17 g by mouth daily as needed for moderate constipation or mild constipation.        Allergies  Allergen  Reactions   Ethanol Other (See Comments)    Severe pancreatitis & liver disease = NO ALCOHOL    Consultations: General surgery GI IR   Procedures/Studies: DG Abd Portable 1V  Result Date: 11/04/2022 CLINICAL DATA:  Abdominal pain. EXAM: PORTABLE ABDOMEN - 1 VIEW COMPARISON:  Radiographs 10/14/2018.  CT 11/01/2022. FINDINGS: 0756 hours. Single supine view of the abdomen excludes the upper abdomen. The bowel gas pattern is nonobstructive. There is residual contrast material within the colon near the splenic flexure. A small amount of contrast is present in the urinary bladder. No suspicious abdominal calcifications. The bones appear unchanged. IMPRESSION: No radiographic evidence of acute abdominal process. See recent CT report. Electronically Signed   By: Richardean Sale M.D.   On: 11/04/2022 08:08   US THORACENTESIS ASP PLEURAL SPACE W/IMG GUIDE  Result Date: 11/03/2022 INDICATION: Patient with history of chronic pancreatitis, recent splenic hemorrhage /embolization of splenic artery, left pleural effusion; request received for diagnostic and therapeutic left thoracentesis EXAM: ULTRASOUND GUIDED DIAGNOSTIC AND THERAPEUTIC LEFT THORACENTESIS MEDICATIONS: 8 ml 1% lidocaine COMPLICATIONS: None immediate. PROCEDURE:  An ultrasound guided thoracentesis was thoroughly discussed with the patient and questions answered. The benefits, risks, alternatives and complications were also discussed. The patient understands and wishes to proceed with the procedure. Written consent was obtained. Ultrasound was performed to localize and mark an adequate pocket of fluid in the LEFT chest. The area was then prepped and draped in the normal sterile fashion. 1% Lidocaine was used for local anesthesia. Under ultrasound guidance a 6 Fr Safe-T-Centesis catheter was introduced. Thoracentesis was performed. The catheter was removed and a dressing applied. FINDINGS: A total of approximately 420 mL of hazy, yellow fluid was  removed. Samples were sent to the laboratory as requested by the clinical team. IMPRESSION: Successful ultrasound guided diagnostic and therapeutic LEFT thoracentesis yielding 420 mL of pleural fluid. Read by: Rowe Robert, PA-C Electronically Signed   By: Michaelle Birks M.D.   On: 11/03/2022 15:26   DG CHEST PORT 1 VIEW  Result Date: 11/03/2022 CLINICAL DATA:  Thoracentesis EXAM: PORTABLE CHEST 1 VIEW COMPARISON:  Chest radiograph 1 day prior FINDINGS: The cardiomediastinal silhouette is stable. The left pleural effusion has decreased in size following thoracentesis with improved aeration of the left lung base. There is no appreciable pneumothorax The right lung is clear. There is no right pleural effusion or pneumothorax There is no acute osseous abnormality. IMPRESSION: Decreased left pleural effusion with improved aeration of the left lung following thoracentesis. No appreciable pneumothorax. Electronically Signed   By: Valetta Mole M.D.   On: 11/03/2022 14:56   DG CHEST PORT 1 VIEW  Result Date: 11/02/2022 CLINICAL DATA:  Shortness of breath. Congestive cough. EXAM: PORTABLE CHEST 1 VIEW COMPARISON:  Radiograph 10/30/2022, chest CT 10/19/2022. Lung bases from abdominal CT earlier today FINDINGS: Increasing left pleural effusion with hazy opacity overlying the left lower lung zone. Grossly stable heart size and mediastinal contours. Slight increase in right infrahilar atelectasis. No pneumothorax. No pulmonary edema. IMPRESSION: Increasing left pleural effusion with hazy opacity overlying the left lower lung zone. Increasing right infrahilar atelectasis. Electronically Signed   By: Keith Rake M.D.   On: 11/02/2022 15:19   CT ABDOMEN PELVIS W CONTRAST  Result Date: 11/01/2022 CLINICAL DATA:  Worsening abdominal pain. Splenic hemorrhage. Status post splenic artery embolization. Pancreatitis. EXAM: CT ABDOMEN AND PELVIS WITH CONTRAST TECHNIQUE: Multidetector CT imaging of the abdomen and pelvis was  performed using the standard protocol following bolus administration of intravenous contrast. RADIATION DOSE REDUCTION: This exam was performed according to the departmental dose-optimization program which includes automated exposure control, adjustment of the mA and/or kV according to patient size and/or use of iterative reconstruction technique. CONTRAST:  168m OMNIPAQUE IOHEXOL 300 MG/ML  SOLN COMPARISON:  10/30/2022 FINDINGS: Lower Chest: Increased moderate left pleural effusion and left lower lobe atelectasis. New tiny right pleural effusion. Hepatobiliary: Few small hepatic cysts remains stable. No hepatic masses identified. 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 High attenuation gallbladder sludge again noted, however there is no evidence of acute cholecystitis. Pancreas: No pancreatic head mass identified. Diffuse pancreatic calcification is again seen, consistent with chronic pancreatitis. A 3.6 cm complex cystic lesion is seen adjacent to the pancreatic tail, which may represent pancreatic pseudocyst or hematoma. 1.0 cm fluid collection adjacent to the pancreatic neck is consistent with a small pseudocyst. Spleen: Complex splenic laceration and perisplenic hematoma are stable since prior study. Stable mild-to-moderate hemoperitoneum. Adrenals/Urinary Tract: No suspicious masses identified. No evidence of ureteral calculi or hydronephrosis. Stomach/Bowel: No evidence  of obstruction, inflammatory process or abnormal fluid collections. Normal appendix visualized. Diverticulosis is seen mainly involving the descending and sigmoid colon, however there is no evidence of diverticulitis. Vascular/Lymphatic: No pathologically enlarged lymph nodes. No acute vascular findings. Aortic atherosclerotic calcification incidentally noted. Reproductive:  No mass or other significant abnormality. Other:  Increased diffuse mesenteric and body wall edema.  Musculoskeletal:  No suspicious bone lesions identified. IMPRESSION: Stable complex splenic laceration and perisplenic hematoma. Stable mild-to-moderate hemoperitoneum. Stable 3.6 cm complex cystic lesion adjacent to the pancreatic tail, which may represent pancreatic pseudocyst or hematoma. Chronic calcific pancreatitis. 1 cm cystic lesion adjacent to the pancreatic neck, consistent with small pseudocyst. 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 Increased diffuse mesenteric and body wall edema. Increased moderate left pleural effusion and left lower lobe atelectasis. New tiny right pleural effusion. Colonic diverticulosis, without radiographic evidence of diverticulitis. Aortic Atherosclerosis (ICD10-I70.0). Electronically Signed   By: Marlaine Hind M.D.   On: 11/01/2022 15:04   IR US Guide Vasc Access Right  Result Date: 10/31/2022 INDICATION: Pancreatitis and spontaneous splenic hemorrhage with parenchymal subcapsular hematoma. Hemoglobin has not responded appropriately to blood transfusion and the patient now presents for splenic artery embolization. EXAM: 1. ULTRASOUND GUIDANCE FOR VASCULAR ACCESS OF THE RIGHT COMMON FEMORAL ARTERY 2. VISCERAL SELECTIVE ARTERIOGRAPHY OF THE SPLENIC ARTERY 3. ADDITIONAL SELECTIVE ARTERIOGRAPHY OF THE DISTAL SPLENIC ARTERY 4. TRANSCATHETER EMBOLIZATION OF THE SPLENIC ARTERY TO TREAT ARTERIAL HEMORRHAGE MEDICATIONS: 2 g IV Ancef. The antibiotic was administered within 1 hour of the procedure ANESTHESIA/SEDATION: Moderate (conscious) sedation was employed during this procedure. A total of Versed 2.0 mg and Fentanyl 100 mcg was administered intravenously. Moderate Sedation Time: 55 minutes. The patient's level of consciousness and vital signs were monitored continuously by radiology nursing throughout the procedure under my direct supervision. CONTRAST:  25 mL Visipaque 320 FLUOROSCOPY TIME:  Radiation Exposure  Index (as provided by the fluoroscopic device): [id number] mGy Kerma COMPLICATIONS: None immediate. PROCEDURE: Informed consent was obtained from the patient following explanation of the procedure, risks, benefits and alternatives. The patient understands, agrees and consents for the procedure. All questions were addressed. A time out was performed prior to the initiation of the procedure. Maximal barrier sterile technique utilized including caps, mask, sterile gowns, sterile gloves, large sterile drape, hand hygiene, and chlorhexidine prep. During the procedure local anesthesia was provided with 1% lidocaine. Ultrasound was used to confirm patency of the right common femoral artery. A permanent ultrasound image was saved and recorded. The right common femoral artery was accessed utilizing a micropuncture set. A 5 French sheath was placed. A 5 French Cobra catheter was advanced into the abdominal aorta. The catheter was used to selectively catheterize the celiac axis. The catheter was then used to selectively catheterize the proximal splenic artery. Selective splenic arteriography was performed. A microcatheter was advanced through the 5 French catheter and further advanced into the distal aspect of the splenic artery. Additional selective splenic arteriography was performed. Embolization was performed through the microcatheter with initial deployment of a Medtronic MVP 5 vascular plug. Additional arteriography was performed through the microcatheter as well as periodic injection of contrast material under fluoroscopy to assess flow in the splenic artery. The microcatheter was removed. The 5 French catheter was then further advanced into the splenic artery. Additional arteriography was performed. An MVP 7 vascular plug was then deployed through the 5 [place] catheter. Additional arteriography was then performed through the 5 French catheter. The catheter  was then removed. Hemostasis was obtained at the sheath access site  with the Angio-Seal device. FINDINGS: Initial splenic arteriography demonstrates a tortuous splenic artery supplying an enlarged spleen. Intra-splenic branches are beaded and tortuous especially in the lower pole of the spleen. There is some displacement of vessels in the central aspect of the splenic parenchyma likely related to intraparenchymal hemorrhage. No evidence of overt contrast extravasation or arterial pseudoaneurysm. Additional selective arteriography was performed near the splenic hilum in order to define bifurcation point of the main splenic artery. After placement of a single vascular plug, there was still flow noted around the plug and eventually the plug migrated in the artery slightly towards the splenic hilum. A second, larger vascular plug was therefore deployed in the distal splenic artery with significant reduction in flow and adequate reduce flow noted. IMPRESSION: Successful embolization of the splenic artery with placement of two separate vascular plugs. Electronically Signed   By: [name] M.D.   On: 10/31/2022 16:43   IR Angiogram Visceral Selective  Result Date: 10/31/2022 INDICATION: Pancreatitis and spontaneous splenic hemorrhage with parenchymal subcapsular hematoma. Hemoglobin has not responded appropriately to blood transfusion and the patient now presents for splenic artery embolization. EXAM: 1. ULTRASOUND GUIDANCE FOR VASCULAR ACCESS OF THE RIGHT COMMON FEMORAL ARTERY 2. VISCERAL SELECTIVE ARTERIOGRAPHY OF THE SPLENIC ARTERY 3. ADDITIONAL SELECTIVE ARTERIOGRAPHY OF THE DISTAL SPLENIC ARTERY 4. TRANSCATHETER EMBOLIZATION OF THE SPLENIC ARTERY TO TREAT ARTERIAL HEMORRHAGE MEDICATIONS: 2 g IV Ancef. The antibiotic was administered within 1 hour of the procedure ANESTHESIA/SEDATION: Moderate (conscious) sedation was employed during this procedure. A total of Versed 2.0 mg and Fentanyl 100 mcg was administered intravenously. Moderate Sedation Time: 55 minutes. The patient's  level of consciousness and vital signs were monitored continuously by radiology nursing throughout the procedure under my direct supervision. CONTRAST:  25 mL Visipaque 320 FLUOROSCOPY TIME:  Radiation Exposure Index (as provided by the fluoroscopic device): [id number] mGy Kerma COMPLICATIONS: None immediate. PROCEDURE: Informed consent was obtained from the patient following explanation of the procedure, risks, benefits and alternatives. The patient understands, agrees and consents for the procedure. All questions were addressed. A time out was performed prior to the initiation of the procedure. Maximal barrier sterile technique utilized including caps, mask, sterile gowns, sterile gloves, large sterile drape, hand hygiene, and chlorhexidine prep. During the procedure local anesthesia was provided with 1% lidocaine. Ultrasound was used to confirm patency of the right common femoral artery. A permanent ultrasound image was saved and recorded. The right common femoral artery was accessed utilizing a micropuncture set. A 5 French sheath was placed. A 5 French Cobra catheter was advanced into the abdominal aorta. The catheter was used to selectively catheterize the celiac axis. The catheter was then used to selectively catheterize the proximal splenic artery. Selective splenic arteriography was performed. A microcatheter was advanced through the 5 French catheter and further advanced into the distal aspect of the splenic artery. Additional selective splenic arteriography was performed. Embolization was performed through the microcatheter with initial deployment of a Medtronic MVP 5 vascular plug. Additional arteriography was performed through the microcatheter as well as periodic injection of contrast material under fluoroscopy to assess flow in the splenic artery. The microcatheter was removed. The 5 French catheter was then further advanced into the splenic artery. Additional arteriography was performed. An MVP 7 vascular  plug was then deployed through the 5 [place] catheter. Additional arteriography was then performed through the 5 French catheter. The catheter was then removed. Hemostasis was  obtained at the sheath access site with the Angio-Seal device. FINDINGS: Initial splenic arteriography demonstrates a tortuous splenic artery supplying an enlarged spleen. Intra-splenic branches are beaded and tortuous especially in the lower pole of the spleen. There is some displacement of vessels in the central aspect of the splenic parenchyma likely related to intraparenchymal hemorrhage. No evidence of overt contrast extravasation or arterial pseudoaneurysm. Additional selective arteriography was performed near the splenic hilum in order to define bifurcation point of the main splenic artery. After placement of a single vascular plug, there was still flow noted around the plug and eventually the plug migrated in the artery slightly towards the splenic hilum. A second, larger vascular plug was therefore deployed in the distal splenic artery with significant reduction in flow and adequate reduce flow noted. IMPRESSION: Successful embolization of the splenic artery with placement of two separate vascular plugs. Electronically Signed   By: [name] M.D.   On: 10/31/2022 16:43   IR Angiogram Selective Each Additional Vessel  Result Date: 10/31/2022 INDICATION: Pancreatitis and spontaneous splenic hemorrhage with parenchymal subcapsular hematoma. Hemoglobin has not responded appropriately to blood transfusion and the patient now presents for splenic artery embolization. EXAM: 1. ULTRASOUND GUIDANCE FOR VASCULAR ACCESS OF THE RIGHT COMMON FEMORAL ARTERY 2. VISCERAL SELECTIVE ARTERIOGRAPHY OF THE SPLENIC ARTERY 3. ADDITIONAL SELECTIVE ARTERIOGRAPHY OF THE DISTAL SPLENIC ARTERY 4. TRANSCATHETER EMBOLIZATION OF THE SPLENIC ARTERY TO TREAT ARTERIAL HEMORRHAGE MEDICATIONS: 2 g IV Ancef. The antibiotic was administered within 1 hour of the  procedure ANESTHESIA/SEDATION: Moderate (conscious) sedation was employed during this procedure. A total of Versed 2.0 mg and Fentanyl 100 mcg was administered intravenously. Moderate Sedation Time: 55 minutes. The patient's level of consciousness and vital signs were monitored continuously by radiology nursing throughout the procedure under my direct supervision. CONTRAST:  25 mL Visipaque 320 FLUOROSCOPY TIME:  Radiation Exposure Index (as provided by the fluoroscopic device): [id number] mGy Kerma COMPLICATIONS: None immediate. PROCEDURE: Informed consent was obtained from the patient following explanation of the procedure, risks, benefits and alternatives. The patient understands, agrees and consents for the procedure. All questions were addressed. A time out was performed prior to the initiation of the procedure. Maximal barrier sterile technique utilized including caps, mask, sterile gowns, sterile gloves, large sterile drape, hand hygiene, and chlorhexidine prep. During the procedure local anesthesia was provided with 1% lidocaine. Ultrasound was used to confirm patency of the right common femoral artery. A permanent ultrasound image was saved and recorded. The right common femoral artery was accessed utilizing a micropuncture set. A 5 French sheath was placed. A 5 French Cobra catheter was advanced into the abdominal aorta. The catheter was used to selectively catheterize the celiac axis. The catheter was then used to selectively catheterize the proximal splenic artery. Selective splenic arteriography was performed. A microcatheter was advanced through the 5 French catheter and further advanced into the distal aspect of the splenic artery. Additional selective splenic arteriography was performed. Embolization was performed through the microcatheter with initial deployment of a Medtronic MVP 5 vascular plug. Additional arteriography was performed through the microcatheter as well as periodic injection of contrast  material under fluoroscopy to assess flow in the splenic artery. The microcatheter was removed. The 5 French catheter was then further advanced into the splenic artery. Additional arteriography was performed. An MVP 7 vascular plug was then deployed through the 5 [place] catheter. Additional arteriography was then performed through the 5 French catheter. The catheter was then removed. Hemostasis was obtained at the sheath  access site with the Angio-Seal device. FINDINGS: Initial splenic arteriography demonstrates a tortuous splenic artery supplying an enlarged spleen. Intra-splenic branches are beaded and tortuous especially in the lower pole of the spleen. There is some displacement of vessels in the central aspect of the splenic parenchyma likely related to intraparenchymal hemorrhage. No evidence of overt contrast extravasation or arterial pseudoaneurysm. Additional selective arteriography was performed near the splenic hilum in order to define bifurcation point of the main splenic artery. After placement of a single vascular plug, there was still flow noted around the plug and eventually the plug migrated in the artery slightly towards the splenic hilum. A second, larger vascular plug was therefore deployed in the distal splenic artery with significant reduction in flow and adequate reduce flow noted. IMPRESSION: Successful embolization of the splenic artery with placement of two separate vascular plugs. Electronically Signed   By: [name] M.D.   On: 10/31/2022 16:43   IR EMBO ART  VEN HEMORR LYMPH EXTRAV  INC GUIDE ROADMAPPING  Result Date: 10/31/2022 INDICATION: Pancreatitis and spontaneous splenic hemorrhage with parenchymal subcapsular hematoma. Hemoglobin has not responded appropriately to blood transfusion and the patient now presents for splenic artery embolization. EXAM: 1. ULTRASOUND GUIDANCE FOR VASCULAR ACCESS OF THE RIGHT COMMON FEMORAL ARTERY 2. VISCERAL SELECTIVE ARTERIOGRAPHY OF THE  SPLENIC ARTERY 3. ADDITIONAL SELECTIVE ARTERIOGRAPHY OF THE DISTAL SPLENIC ARTERY 4. TRANSCATHETER EMBOLIZATION OF THE SPLENIC ARTERY TO TREAT ARTERIAL HEMORRHAGE MEDICATIONS: 2 g IV Ancef. The antibiotic was administered within 1 hour of the procedure ANESTHESIA/SEDATION: Moderate (conscious) sedation was employed during this procedure. A total of Versed 2.0 mg and Fentanyl 100 mcg was administered intravenously. Moderate Sedation Time: 55 minutes. The patient's level of consciousness and vital signs were monitored continuously by radiology nursing throughout the procedure under my direct supervision. CONTRAST:  25 mL Visipaque 320 FLUOROSCOPY TIME:  Radiation Exposure Index (as provided by the fluoroscopic device): [id number] mGy Kerma COMPLICATIONS: None immediate. PROCEDURE: Informed consent was obtained from the patient following explanation of the procedure, risks, benefits and alternatives. The patient understands, agrees and consents for the procedure. All questions were addressed. A time out was performed prior to the initiation of the procedure. Maximal barrier sterile technique utilized including caps, mask, sterile gowns, sterile gloves, large sterile drape, hand hygiene, and chlorhexidine prep. During the procedure local anesthesia was provided with 1% lidocaine. Ultrasound was used to confirm patency of the right common femoral artery. A permanent ultrasound image was saved and recorded. The right common femoral artery was accessed utilizing a micropuncture set. A 5 French sheath was placed. A 5 French Cobra catheter was advanced into the abdominal aorta. The catheter was used to selectively catheterize the celiac axis. The catheter was then used to selectively catheterize the proximal splenic artery. Selective splenic arteriography was performed. A microcatheter was advanced through the 5 French catheter and further advanced into the distal aspect of the splenic artery. Additional selective splenic  arteriography was performed. Embolization was performed through the microcatheter with initial deployment of a Medtronic MVP 5 vascular plug. Additional arteriography was performed through the microcatheter as well as periodic injection of contrast material under fluoroscopy to assess flow in the splenic artery. The microcatheter was removed. The 5 French catheter was then further advanced into the splenic artery. Additional arteriography was performed. An MVP 7 vascular plug was then deployed through the 5 [place] catheter. Additional arteriography was then performed through the 5 French catheter. The catheter was then removed. Hemostasis was obtained at  the sheath access site with the Angio-Seal device. FINDINGS: Initial splenic arteriography demonstrates a tortuous splenic artery supplying an enlarged spleen. Intra-splenic branches are beaded and tortuous especially in the lower pole of the spleen. There is some displacement of vessels in the central aspect of the splenic parenchyma likely related to intraparenchymal hemorrhage. No evidence of overt contrast extravasation or arterial pseudoaneurysm. Additional selective arteriography was performed near the splenic hilum in order to define bifurcation point of the main splenic artery. After placement of a single vascular plug, there was still flow noted around the plug and eventually the plug migrated in the artery slightly towards the splenic hilum. A second, larger vascular plug was therefore deployed in the distal splenic artery with significant reduction in flow and adequate reduce flow noted. IMPRESSION: Successful embolization of the splenic artery with placement of two separate vascular plugs. Electronically Signed   By: [name] M.D.   On: 10/31/2022 16:43   CT Angio Abd/Pel w/ and/or w/o  Result Date: 10/30/2022 CLINICAL DATA:  Splenomegaly, history of pancreatitis, abdominal pain, evidence of hemorrhage on previous CT EXAM: CTA ABDOMEN AND  PELVIS WITHOUT AND WITH CONTRAST TECHNIQUE: Multidetector CT imaging of the abdomen and pelvis was performed using the standard protocol during bolus administration of intravenous contrast. Multiplanar reconstructed images and MIPs were obtained and reviewed to evaluate the vascular anatomy. RADIATION DOSE REDUCTION: This exam was performed according to the departmental dose-optimization program which includes automated exposure control, adjustment of the mA and/or kV according to patient size and/or use of iterative reconstruction technique. CONTRAST:  79m OMNIPAQUE IOHEXOL 350 MG/ML SOLN COMPARISON:  10/30/2022, 10/20/2022, 10/19/2022 FINDINGS: VASCULAR Aorta: Normal caliber aorta without aneurysm, dissection, vasculitis or significant stenosis. Atherosclerosis. Celiac: Patent without evidence of aneurysm, dissection, vasculitis or significant stenosis. SMA: Patent without evidence of aneurysm, dissection, vasculitis or significant stenosis. Renals: Both renal arteries are patent without evidence of aneurysm, dissection, vasculitis, fibromuscular dysplasia or significant stenosis. IMA: Patent without evidence of aneurysm, dissection, vasculitis or significant stenosis. Inflow: Patent without evidence of aneurysm, dissection, vasculitis or significant stenosis. Proximal Outflow: Bilateral common femoral and visualized portions of the superficial and profunda femoral arteries are patent without evidence of aneurysm, dissection, vasculitis or significant stenosis. Veins: There is mild extrinsic compression upon the main portal vein due to the inflammatory changes of the pancreas. 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 Remaining venous structures are unremarkable. Review of the MIP images confirms the above findings. NON-VASCULAR Lower chest: Trace left pleural effusion. No acute airspace disease.  Hepatobiliary: Stable intrahepatic and extrahepatic biliary duct dilation, with common bile duct measuring up to 11 mm. There is narrowing of the downstream common bile duct in the region of the pancreatic head. Minimal gallbladder sludge without evidence of cholecystitis. Stable benign hepatic cysts. Pancreas: Stable parenchymal calcifications within the head and body of the pancreas. There is decreased parenchymal enhancement within the region of the pancreatic head, consistent with edema and pancreatitis. Marked peripancreatic inflammatory change. There is a complex cystic structure adjacent to the tail the pancreas, measuring 4.1 x 2.6 cm, compatible with the hemorrhagic pseudocyst seen on previous imaging. No evidence of contrast extravasation to suggest active hemorrhage. Spleen: The spleen remains enlarged and heterogeneous, measuring approximately 10.0 x 7.7 by 10.5 cm in size. The diffuse heterogeneity of the splenic parenchyma seen previously is again noted, consistent with intracapsular hemorrhage. There is no evidence of contrast  extravasation to suggest active bleeding. While no distinct capsular defect is identified, significant perisplenic fat stranding in the left upper quadrant as well as the high attenuation fluid throughout the abdomen and pelvis is concerning for splenic rupture. Adrenals/Urinary Tract: Stable right renal atrophy. No urinary tract calculi or obstruction. Bladder is unremarkable. The adrenals are normal. Stomach/Bowel: No bowel obstruction or ileus. No bowel wall thickening or inflammatory change. Lymphatic: No pathologic adenopathy within the abdomen or pelvis. Reproductive: Stable appearance of the prostate. Other: High attenuation ascites throughout the abdomen and pelvis is unchanged, consistent with hemoperitoneum. No free intraperitoneal gas. No abdominal wall hernia. Musculoskeletal: No acute or destructive bony lesions. Reconstructed images demonstrate no additional  findings. IMPRESSION: VASCULAR 1. No evidence of contrast extravasation to suggest active intra-abdominal or intrapelvic hemorrhage. 2. 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 3.  Aortic Atherosclerosis (ICD10-I70.0). NON-VASCULAR 1. Stable heterogeneous enlargement of the spleen, with perisplenic fat stranding and high attenuation fluid throughout the abdomen and pelvis consistent with intra splenic hemorrhage and likely capsular rupture. 2. Stable changes of pancreatitis, with heterogeneous fluid collection at the pancreatic tail consistent with hemorrhagic pseudocyst. 3. Stable high attenuation free fluid throughout the abdomen and pelvis compatible with hemoperitoneum. 4. Stable intrahepatic and extrahepatic biliary duct dilation, with narrowing of the downstream common bile duct at the level of the pancreatic head. Please see recent MRI evaluation. 5. Gallbladder sludge without cholelithiasis or cholecystitis. 6. Trace left pleural effusion. Electronically Signed   By: Randa Ngo M.D.   On: 10/30/2022 22:37   CT ABDOMEN PELVIS WO CONTRAST  Result Date: 10/30/2022 CLINICAL DATA:  Abdominal pain and syncope, history of pancreatitis, initial encounter EXAM: CT ABDOMEN AND PELVIS WITHOUT CONTRAST TECHNIQUE: Multidetector CT imaging of the abdomen and pelvis was performed following the standard protocol without IV contrast. RADIATION DOSE REDUCTION: This exam was performed according to the departmental dose-optimization program which includes automated exposure control, adjustment of the mA and/or kV according to patient size and/or use of iterative reconstruction technique. COMPARISON:  10/19/2022 FINDINGS: Lower chest: Lung bases are free of acute infiltrate or sizable effusion. Cardiac blood pool is decreased in attenuation consistent with underlying  anemia. Hepatobiliary: Gallbladder is within normal limits. Liver shows no focal abnormality. Very mild Peri hepatic fluid is noted. Pancreas: Diffuse calcifications are noted in the head and uncinate process and to a lesser degree within the body of the pancreas consistent with chronic pancreatitis. Superimposed inflammatory change in the pancreas is noted consistent with acute on chronic pancreatitis. Persistent cystic lesion is noted within the tail of the pancreas again measuring approximately 2.4 cm. This however demonstrates increased density when compared with the recent CT likely related to some underlying hemorrhage within. Spleen: The spleen is significantly enlarged when compare with the prior exam. The previously seen perisplenic fluid collection is not well visualized. These changes are likely related to interval splenic hemorrhage. These changes would correspond with the patient's given clinical history of increased pain. Adrenals/Urinary Tract: Adrenal glands are within normal limits. Kidneys demonstrate no renal calculi or obstructive changes. Simple cyst is noted in the upper pole of the right kidney. No further follow-up is recommended. The bladder is decompressed. Stomach/Bowel: The appendix is not well visualized. No inflammatory changes to suggest appendicitis are noted. Small bowel and stomach appear within normal limits. Vascular/Lymphatic: Aortic atherosclerosis. No enlarged abdominal or  pelvic lymph nodes. Reproductive: Pancreas appears within normal limits. Other: Considerable free fluid is noted which is of greater density than that expected for free fluid and with what appears to be a hematocrit level deep in the pelvis. These changes are consistent with intra-abdominal hemorrhage. Musculoskeletal: Degenerative changes of lumbar spine are noted. No acute bony abnormality is seen. IMPRESSION: Changes consistent with persistent acute pancreatitis. There has been interval hemorrhage within  the cystic lesion within the tail the pancreas when compared with the prior CT examination. The spleen is now significantly enlarged and the splenic parenchyma is not well delineated. Given the perisplenic fluid seen previously and increase in free abdominal fluid with hematocrit, these changes are felt to be related to splenic hemorrhage and likely rupture into the peritoneal cavity. Changes of underlying anemia with decreased density of the cardiac blood pool. Critical Value/emergent results were called by telephone at the time of interpretation on 10/30/2022 at 8:28 pm to Dr. Sherwood Gambler , who verbally acknowledged these results. Electronically Signed   By: Inez Catalina M.D.   On: 10/30/2022 20:37   DG Chest Portable 1 View  Result Date: 10/30/2022 CLINICAL DATA:  Syncope.  Nausea.  Vomiting. EXAM: PORTABLE CHEST 1 VIEW COMPARISON:  CXR 10/19/22 FINDINGS: No pleural effusion. No pneumothorax. No focal airspace opacity. Normal cardiac and mediastinal contours. No radiographically apparent displaced rib fractures. Visualized upper abdomen is unremarkable. IMPRESSION: No acute cardiopulmonary disease. Electronically Signed   By: Marin Roberts M.D.   On: 10/30/2022 18:22   MR ABDOMEN MRCP W WO CONTAST  Result Date: 10/20/2022 CLINICAL DATA:  Sequelae of chronic pancreatitis on recent CT. EXAM: MRI ABDOMEN WITHOUT AND WITH CONTRAST (INCLUDING MRCP) TECHNIQUE: Multiplanar multisequence MR imaging of the abdomen was performed both before and after the administration of intravenous contrast. Heavily T2-weighted images of the biliary and pancreatic ducts were obtained, and three-dimensional MRCP images were rendered by post processing. CONTRAST:  48m GADAVIST GADOBUTROL 1 MMOL/ML IV SOLN COMPARISON:  CTA 10/19/2022.  MRI/MRCP 02/10/2021 FINDINGS: Lower chest: Left base atelectasis with left pleural effusion. Hepatobiliary: 6 mm cyst noted in the dome of the liver with 9 mm simple cyst in the anterior right liver.  No followup imaging is recommended. Gallbladder is distended with some dependent sludge but no stones evident. Intra and extrahepatic biliary duct dilatation is similar to previous MRI. Common duct measured previously at 12 mm is 11 mm today. Common bile duct just proximal to the ampulla is 10 mm today compared to 10 mm previously. As noted previously, there is abrupt tapering of the pancreatic duct in the head of the pancreas. No evidence for choledocholithiasis. Pancreas: Pancreatic head is prominent although the marked calcification seen on CT is not evident on MRI. 13 mm simple appearing cyst noted in the anterior pancreatic head, new in the interval since previous MRI. A second cyst in the tail of the pancreas measures 3.5 cm, showing thin smooth rim enhancement and no evidence for internal septation although there is some debris within the collection. Cyst is relatively homogeneous and low signal intensity on T1 with high signal intensity on T2 imaging. No main duct dilatation in the pancreas. Spleen: No splenomegaly. No focal mass lesion. Small collection of fluid posterior to the spleen may be loculated. There is a 4.5 x 2.0 cm rim enhancing collection lateral to the spleen that extends up into the lateral aspect of the subdiaphragmatic space. Adrenals/Urinary Tract: No adrenal nodule or mass. Simple cyst noted upper pole right  kidney. No followup imaging is recommended. No suspicious abnormality in the left kidney. No hydronephrosis. Stomach/Bowel: Stomach is unremarkable. No gastric wall thickening. No evidence of outlet obstruction. Duodenum is normally positioned as is the ligament of Treitz. Thickening in jejunal small bowel loops of the left upper quadrant is likely secondary. Vascular/Lymphatic: No abdominal aortic aneurysm. Small hepatoduodenal ligament and retroperitoneal lymph nodes evident. Other: Small volume ascites noted with mesenteric edema. 5.4 x 1.8 cm loculated rim enhancing collections  identified lateral to the splenic flexure of the colon. Musculoskeletal: No focal suspicious marrow enhancement within the visualized bony anatomy. IMPRESSION: 1. Stable intra and extrahepatic biliary duct dilatation with abrupt tapering of the pancreatic duct in the head of the pancreas. No evidence for choledocholithiasis. No discernible obstructing mass lesion. 2. The marked pancreatic parenchymal calcification seen on CT is not evident on MRI. No main duct dilatation in the pancreas. 3. 3.5 cm cyst in the tail of the pancreas showing thin smooth rim enhancement and some debris within the collection. A second 13 mm cyst identified in the head of the pancreas. These are likely pseudocysts although superinfection cannot be excluded. 4. 4.5 x 2.0 cm rim enhancing fluid collection identified lateral to the spleen with probable loculated fluid posterior to the spleen as well. An additional 5.4 x 1.8 cm loculated rim enhancing collection identified lateral to the splenic flexure of the colon. These may represent pseudocysts although abscess is a possibility. 5. Distended gallbladder with layering sludge. No gallbladder wall thickening or evidence of gallstones. 6. Small volume ascites with mesenteric edema. 7. Left base atelectasis with left pleural effusion. 8. Thickening in jejunal small bowel loops of the left upper quadrant is likely secondary. Electronically Signed   By: Misty Stanley M.D.   On: 10/20/2022 08:40   MR 3D Recon At Scanner  Result Date: 10/20/2022 CLINICAL DATA:  Sequelae of chronic pancreatitis on recent CT. EXAM: MRI ABDOMEN WITHOUT AND WITH CONTRAST (INCLUDING MRCP) TECHNIQUE: Multiplanar multisequence MR imaging of the abdomen was performed both before and after the administration of intravenous contrast. Heavily T2-weighted images of the biliary and pancreatic ducts were obtained, and three-dimensional MRCP images were rendered by post processing. CONTRAST:  74m GADAVIST GADOBUTROL 1 MMOL/ML  IV SOLN COMPARISON:  CTA 10/19/2022.  MRI/MRCP 02/10/2021 FINDINGS: Lower chest: Left base atelectasis with left pleural effusion. Hepatobiliary: 6 mm cyst noted in the dome of the liver with 9 mm simple cyst in the anterior right liver. No followup imaging is recommended. Gallbladder is distended with some dependent sludge but no stones evident. Intra and extrahepatic biliary duct dilatation is similar to previous MRI. Common duct measured previously at 12 mm is 11 mm today. Common bile duct just proximal to the ampulla is 10 mm today compared to 10 mm previously. As noted previously, there is abrupt tapering of the pancreatic duct in the head of the pancreas. No evidence for choledocholithiasis. Pancreas: Pancreatic head is prominent although the marked calcification seen on CT is not evident on MRI. 13 mm simple appearing cyst noted in the anterior pancreatic head, new in the interval since previous MRI. A second cyst in the tail of the pancreas measures 3.5 cm, showing thin smooth rim enhancement and no evidence for internal septation although there is some debris within the collection. Cyst is relatively homogeneous and low signal intensity on T1 with high signal intensity on T2 imaging. No main duct dilatation in the pancreas. Spleen: No splenomegaly. No focal mass lesion. Small collection of fluid  posterior to the spleen may be loculated. There is a 4.5 x 2.0 cm rim enhancing collection lateral to the spleen that extends up into the lateral aspect of the subdiaphragmatic space. Adrenals/Urinary Tract: No adrenal nodule or mass. Simple cyst noted upper pole right kidney. No followup imaging is recommended. No suspicious abnormality in the left kidney. No hydronephrosis. Stomach/Bowel: Stomach is unremarkable. No gastric wall thickening. No evidence of outlet obstruction. Duodenum is normally positioned as is the ligament of Treitz. Thickening in jejunal small bowel loops of the left upper quadrant is likely  secondary. Vascular/Lymphatic: No abdominal aortic aneurysm. Small hepatoduodenal ligament and retroperitoneal lymph nodes evident. Other: Small volume ascites noted with mesenteric edema. 5.4 x 1.8 cm loculated rim enhancing collections identified lateral to the splenic flexure of the colon. Musculoskeletal: No focal suspicious marrow enhancement within the visualized bony anatomy. IMPRESSION: 1. Stable intra and extrahepatic biliary duct dilatation with abrupt tapering of the pancreatic duct in the head of the pancreas. No evidence for choledocholithiasis. No discernible obstructing mass lesion. 2. The marked pancreatic parenchymal calcification seen on CT is not evident on MRI. No main duct dilatation in the pancreas. 3. 3.5 cm cyst in the tail of the pancreas showing thin smooth rim enhancement and some debris within the collection. A second 13 mm cyst identified in the head of the pancreas. These are likely pseudocysts although superinfection cannot be excluded. 4. 4.5 x 2.0 cm rim enhancing fluid collection identified lateral to the spleen with probable loculated fluid posterior to the spleen as well. An additional 5.4 x 1.8 cm loculated rim enhancing collection identified lateral to the splenic flexure of the colon. These may represent pseudocysts although abscess is a possibility. 5. Distended gallbladder with layering sludge. No gallbladder wall thickening or evidence of gallstones. 6. Small volume ascites with mesenteric edema. 7. Left base atelectasis with left pleural effusion. 8. Thickening in jejunal small bowel loops of the left upper quadrant is likely secondary. Electronically Signed   By: Misty Stanley M.D.   On: 10/20/2022 08:40   US Abdomen Limited RUQ (LIVER/GB)  Result Date: 10/19/2022 CLINICAL DATA:  Epigastric pain EXAM: ULTRASOUND ABDOMEN LIMITED RIGHT UPPER QUADRANT COMPARISON:  CT angiogram chest abdomen and pelvis 10/19/2022 FINDINGS: Gallbladder: No gallstones or wall thickening  visualized. Gallbladder is dilated. Gallbladder sludge is present. No sonographic Murphy sign noted by sonographer. Common bile duct: Diameter: 10 mm. There is also mild-to-moderate intrahepatic biliary ductal dilatation. Liver: Two hepatic cysts are identified. Right lobe cyst measures 11 x 10 x 11 mm. Left lobe cyst measures 11 x 7 x 8 mm. Within normal limits in parenchymal echogenicity. Portal vein is patent on color Doppler imaging with normal direction of blood flow towards the liver. Other: Incidental right renal simple cyst measuring 2.2 x 1.7 x 3.4 cm. IMPRESSION: 1. Gallbladder hydrops with gallbladder sludge. No gallstones or wall thickening. 2. Dilated common bile duct with mild-to-moderate intrahepatic biliary ductal dilatation. Recommend further evaluation with MRCP. Electronically Signed   By: Ronney Asters M.D.   On: 10/19/2022 21:06   CT Angio Chest/Abd/Pel for Dissection W and/or W/WO  Result Date: 10/19/2022 CLINICAL DATA:  Concern for aerated aneurysm. Left-sided chest pain. EXAM: CT ANGIOGRAPHY CHEST, ABDOMEN AND PELVIS TECHNIQUE: Non-contrast CT of the chest was initially obtained. Multidetector CT imaging through the chest, abdomen and pelvis was performed using the standard protocol during bolus administration of intravenous contrast. Multiplanar reconstructed images and MIPs were obtained and reviewed to evaluate the vascular anatomy. RADIATION  DOSE REDUCTION: This exam was performed according to the departmental dose-optimization program which includes automated exposure control, adjustment of the mA and/or kV according to patient size and/or use of iterative reconstruction technique. CONTRAST:  134m OMNIPAQUE IOHEXOL 350 MG/ML SOLN COMPARISON:  CT abdomen pelvis dated 01/04/2022. FINDINGS: Evaluation is limited due to streak artifact caused by patient's arms. Evaluation is also limited due to anasarca and paucity of intra-abdominal fat. CTA CHEST FINDINGS Cardiovascular: There is no  cardiomegaly or pericardial effusion. There is mild atherosclerotic calcification of the thoracic aorta. No aneurysmal dilatation or dissection. The aorta is tortuous. The origins of the great vessels of the aortic arch appear patent. No pulmonary artery embolus identified. Mediastinum/Nodes: No hilar or mediastinal adenopathy. The esophagus is grossly unremarkable. No mediastinal fluid collection. Lungs/Pleura: Small left pleural effusion with partial compressive atelectasis of the left versus pneumonia. There is background of centrilobular emphysema. There is no pneumothorax. The central airways are patent. Musculoskeletal: Degenerative changes of the spine. No acute osseous pathology. Review of the MIP images confirms the above findings. CTA ABDOMEN AND PELVIS FINDINGS VASCULAR Aorta: Mild atherosclerotic calcification of the abdominal aorta. No aneurysmal dilatation or dissection. Celiac: Patent without evidence of aneurysm, dissection, vasculitis or significant stenosis. SMA: Patent without evidence of aneurysm, dissection, vasculitis or significant stenosis. Renals: Both renal arteries are patent without evidence of aneurysm, dissection, vasculitis, fibromuscular dysplasia or significant stenosis. IMA: Patent without evidence of aneurysm, dissection, vasculitis or significant stenosis. Inflow: Mild atherosclerotic calcification of the iliac arteries. The iliac arteries are patent. No aneurysmal dilatation or dissection. Veins: No obvious venous abnormality within the limitations of this arterial phase study. Review of the MIP images confirms the above findings. NON-VASCULAR No intra-abdominal free air.  Small free fluid within the pelvis. Hepatobiliary: Several small liver cysts, too small to characterize. There is mild dilatation of the biliary tree. No calcified stone within the gallbladder. Pancreas: There is coarse calcification of the head and uncinate process of the pancreas sequela of chronic  pancreatitis. Correlation with pancreatic enzymes recommended to evaluate for for possibility of acute on chronic pancreatitis. There is a 3.3 x 2.6 cm cyst in the tail of the pancreas most consistent with a pseudocyst. Attention on follow-up imaging recommended. Spleen: The spleen is unremarkable. There is perisplenic free fluid which appears somewhat loculated, likely sequela of pancreatitis. This fluid may represent a pseudocyst. Adrenals/Urinary Tract: The adrenal glands are poorly visualized. There is a small right renal upper pole cyst. There is no hydronephrosis on either side. The urinary bladder is grossly unremarkable. Stomach/Bowel: There is moderate stool throughout the colon. There is no bowel obstruction. Lymphatic: No adenopathy. Reproductive: The prostate gland is mildly enlarged measuring 4.5 cm in transverse axial diameter. The seminal vesicles are symmetric. Other: Diffuse mesenteric and subcutaneous edema and anasarca. Musculoskeletal: Degenerative changes of the spine. No acute osseous pathology. Review of the MIP images confirms the above findings. IMPRESSION: 1. No aortic aneurysm or dissection. 2. Small left pleural effusion with partial compressive atelectasis of the left versus pneumonia. 3. Sequela of chronic pancreatitis including pancreatic head calcification pancreatic tail pseudocyst. Correlation with pancreatic enzymes recommended to evaluate for possibility of acute on chronic pancreatitis. 4. Perisplenic fluid collection most consistent with post inflammatory collection or developing pseudocysts. An abscess sign report cyst is not excluded. 5. No bowel obstruction. 6. Aortic Atherosclerosis (ICD10-I70.0) and Emphysema (ICD10-J43.9). Electronically Signed   By: AAnner CreteM.D.   On: 10/19/2022 17:41   DG Chest 2 View  Result Date: 10/19/2022 CLINICAL DATA:  Chest pain EXAM: CHEST - 2 VIEW COMPARISON:  01/04/2022 FINDINGS: Asymmetric elevation left hemidiaphragm. Trace  atelectasis noted left base with possible tiny left pleural effusion. Right lung clear. The cardiopericardial silhouette is within normal limits for size. The visualized bony structures of the thorax are unremarkable. IMPRESSION: Asymmetric elevation left hemidiaphragm with trace left base atelectasis and possible tiny left pleural effusion. Electronically Signed   By: Misty Stanley M.D.   On: 10/19/2022 13:41   (Echo, Carotid, EGD, Colonoscopy, ERCP)    Subjective: Patient seen and examined.  Daughter at the bedside.  Up in the chair.  He tells me he occasionally gets pain but only taking some pills.  Wants to go home.  Eating regular diet and tolerating.   Discharge Exam: Vitals:   11/06/22 0640 11/06/22 0730  BP: 126/85 124/80  Pulse: 96 93  Resp: 18 17  Temp: 97.9 F (36.6 C) 97.8 F (36.6 C)  SpO2: 93% 94%   Vitals:   11/05/22 1507 11/05/22 2003 11/06/22 0640 11/06/22 0730  BP: 125/87 125/75 126/85 124/80  Pulse: 100 99 96 93  Resp: (!) '21 18 18 17  '$ Temp: 99.6 F (37.6 C) (!) 97.4 F (36.3 C) 97.9 F (36.6 C) 97.8 F (36.6 C)  TempSrc: Oral Oral Oral Oral  SpO2: 92% 91% 93% 94%  Weight:      Height:        General: Pt is alert, awake, not in acute distress Frail and debilitated.  Thin looking. Cardiovascular: RRR, S1/S2 +, no rubs, no gallops Respiratory: CTA bilaterally, no wheezing, no rhonchi Abdominal: Soft, mildly tender along the epigastrium without rigidity or guarding.    The results of significant diagnostics from this hospitalization (including imaging, microbiology, ancillary and laboratory) are listed below for reference.     Microbiology: Recent Results (from the past 240 hour(s))  MRSA Next Gen by PCR, Nasal     Status: None   Collection Time: 10/31/22  1:00 PM   Specimen: Nasal Mucosa; Nasal Swab  Result Value Ref Range Status   MRSA by PCR Next Gen NOT DETECTED NOT DETECTED Final    Comment: (NOTE) The GeneXpert MRSA Assay (FDA approved for  NASAL specimens only), is one component of a comprehensive MRSA colonization surveillance program. It is not intended to diagnose MRSA infection nor to guide or monitor treatment for MRSA infections. Test performance is not FDA approved in patients less than 80 years old. Performed at [facility], [place] [address], [place], [place] [address]   Gram stain     Status: None   Collection Time: 11/03/22  2:30 PM   Specimen: PATH Cytology Pleural fluid  Result Value Ref Range Status   Specimen Description   Final    PLEURAL Performed at [facility] [address] [place], [place] [address]    Special Requests   Final    NONE Performed at [facility], [place] [address], [place], [place] [address]    Gram Stain   Final    CYTOSPIN SMEAR WBC PRESENT, PREDOMINANTLY PMN NO ORGANISMS SEEN Performed at [facility], [place] [address] [place], [place] [address]    Report Status 11/03/2022 FINAL  Final     Labs: BNP (last 3 results) No results for input(s): "BNP" in the last 8760 hours. Basic Metabolic Panel: Recent Labs  Lab 11/01/22 0616 11/02/22 0643 11/03/22 0259 11/04/22 0622 11/05/22 0557  NA 135 131*  130* 132* 133*  K 3.8 3.7 3.6 3.6 3.9  CL 101 99 102 103 104  CO2 '24 23 23 22 24  '$ GLUCOSE 103* 100* 110* 118* 113*  BUN '23 21 18 14 16  '$ CREATININE 1.22 0.97 0.96 0.96 0.97  CALCIUM 7.8* 8.0* 7.5* 7.9* 7.9*   Liver Function Tests: Recent Labs  Lab 10/30/22 1819 10/31/22 1535 11/01/22 0616 11/02/22 0643 11/04/22 0622  AST 55* '24 18 17 18  '$ ALT 59* 38 '27 21 14  '$ ALKPHOS 164* 139* 113 101 86  BILITOT 4.1* 3.1* 3.1* 2.9* 2.4*  PROT 6.5 6.2* 5.5* 5.6* 5.9*  ALBUMIN 2.4* 2.1* 2.0* 1.9* 2.0*   Recent Labs  Lab 10/30/22 1819 11/01/22 0616 11/02/22 0643 11/04/22 0622  LIPASE 276* 120* 167* 168*   No results for input(s): "AMMONIA" in the last 168 hours. CBC: Recent Labs  Lab 10/30/22 1819 10/31/22 0516  11/02/22 0643 11/03/22 0259 11/04/22 0622 11/05/22 0557 11/06/22 0648  WBC 19.4*   < > 14.0* 14.2* 21.0* 24.1* 24.8*  NEUTROABS 15.8*  --   --   --   --   --  20.8*  HGB 6.1*   < > 8.1* 7.5* 9.0* 8.3* 8.8*  HCT 18.2*   < > 24.7* 23.0* 28.1* 25.5* 27.7*  MCV 100.6*   < > 96.1 96.6 97.9 97.3 97.9  PLT 354   < > 245 247 294 285 353   < > = values in this interval not displayed.   Cardiac Enzymes: No results for input(s): "CKTOTAL", "CKMB", "CKMBINDEX", "TROPONINI" in the last 168 hours. BNP: Invalid input(s): "POCBNP" CBG: Recent Labs  Lab 11/03/22 0619 11/03/22 1327 11/03/22 1807 11/03/22 2341 11/04/22 0620  GLUCAP 113* 124* 174* 136* 132*   D-Dimer No results for input(s): "DDIMER" in the last 72 hours. Hgb A1c No results for input(s): "HGBA1C" in the last 72 hours. Lipid Profile No results for input(s): "CHOL", "HDL", "LDLCALC", "TRIG", "CHOLHDL", "LDLDIRECT" in the last 72 hours. Thyroid function studies No results for input(s): "TSH", "T4TOTAL", "T3FREE", "THYROIDAB" in the last 72 hours.  Invalid input(s): "FREET3" Anemia work up No results for input(s): "VITAMINB12", "FOLATE", "FERRITIN", "TIBC", "IRON", "RETICCTPCT" in the last 72 hours. Urinalysis    Component Value Date/Time   COLORURINE AMBER (A) 10/31/2022 0643   APPEARANCEUR CLEAR 10/31/2022 0643   LABSPEC 1.036 (H) 10/31/2022 0643   PHURINE 5.0 10/31/2022 0643   GLUCOSEU NEGATIVE 10/31/2022 0643   HGBUR NEGATIVE 10/31/2022 0643   BILIRUBINUR NEGATIVE 10/31/2022 0643   KETONESUR NEGATIVE 10/31/2022 0643   PROTEINUR 30 (A) 10/31/2022 0643   NITRITE NEGATIVE 10/31/2022 0643   LEUKOCYTESUR NEGATIVE 10/31/2022 0643   Sepsis Labs Recent Labs  Lab 11/03/22 0259 11/04/22 0622 11/05/22 0557 11/06/22 0648  WBC 14.2* 21.0* 24.1* 24.8*   Microbiology Recent Results (from the past 240 hour(s))  MRSA Next Gen by PCR, Nasal     Status: None   Collection Time: 10/31/22  1:00 PM   Specimen: Nasal Mucosa;  Nasal Swab  Result Value Ref Range Status   MRSA by PCR Next Gen NOT DETECTED NOT DETECTED Final    Comment: (NOTE) The GeneXpert MRSA Assay (FDA approved for NASAL specimens only), is one component of a comprehensive MRSA colonization surveillance program. It is not intended to diagnose MRSA infection nor to guide or monitor treatment for MRSA infections. Test performance is not FDA approved in patients less than 33 years old. Performed at [facility], [address] [place], [place]  [id number]   Gram stain     Status: None   Collection Time: 11/03/22  2:30 PM   Specimen: PATH Cytology Pleural fluid  Result Value Ref Range Status   Specimen Description   Final    PLEURAL Performed at [facility] [address] [place], [place] [address]    Special Requests   Final    NONE Performed at [facility], [place] [address] [place], [place] [address]    Gram Stain   Final    CYTOSPIN SMEAR WBC PRESENT, PREDOMINANTLY PMN NO ORGANISMS SEEN Performed at [facility], [place] [address] [place], [place] [address]    Report Status 11/03/2022 FINAL  Final     Time coordinating discharge:  35 minutes  SIGNED:   [name], MD  [facility] 11/06/2022, 10:00 AM

## 2022-11-06 NOTE — Progress Notes (Signed)
Patient ID: Jared Duran, male   DOB: 1951/01/23, 72 y.o.   MRN: [id number] [facility] Surgery Progress Note     Subjective: CC-  Up in chair. Continues to have some abdominal pain but states that each day is a little better. He is tolerating a diet. Denies n/v. Passing flatus and had a good BM yesterday and one this morning. WBC 24.8, afebrile. Hgb 8.8 from 8.3  Objective: Vital signs in last 24 hours: Temp:  [97.4 F (36.3 C)-99.6 F (37.6 C)] 97.8 F (36.6 C) (02/29 0730) Pulse Rate:  [93-100] 93 (02/29 0730) Resp:  [17-21] 17 (02/29 0730) BP: (124-126)/(75-87) 124/80 (02/29 0730) SpO2:  [91 %-94 %] 94 % (02/29 0730) Last BM Date : 11/04/22 (Per patient)  Intake/Output from previous day: 02/28 0701 - 02/29 0700 In: 475 [P.O.:475] Out: 550 [Urine:550] Intake/Output this shift: No intake/output data recorded.  PE: Gen:  Alert, NAD, chronically ill appearing Card:  RRR Pulm:  Normal effort Abd: distended but soft, mild diffuse tenderness without rebound or guarding  Lab Results:  Recent Labs    11/05/22 0557 11/06/22 0648  WBC 24.1* 24.8*  HGB 8.3* 8.8*  HCT 25.5* 27.7*  PLT 285 353   BMET Recent Labs    11/04/22 0622 11/05/22 0557  NA 132* 133*  K 3.6 3.9  CL 103 104  CO2 22 24  GLUCOSE 118* 113*  BUN 14 16  CREATININE 0.96 0.97  CALCIUM 7.9* 7.9*   PT/INR No results for input(s): "LABPROT", "INR" in the last 72 hours. CMP     Component Value Date/Time   NA 133 (L) 11/05/2022 0557   K 3.9 11/05/2022 0557   CL 104 11/05/2022 0557   CO2 24 11/05/2022 0557   GLUCOSE 113 (H) 11/05/2022 0557   BUN 16 11/05/2022 0557   CREATININE 0.97 11/05/2022 0557   CALCIUM 7.9 (L) 11/05/2022 0557   PROT 5.9 (L) 11/04/2022 0622   ALBUMIN 2.0 (L) 11/04/2022 0622   AST 18 11/04/2022 0622   ALT 14 11/04/2022 0622   ALKPHOS 86 11/04/2022 0622   BILITOT 2.4 (H) 11/04/2022 0622   GFRNONAA >60 11/05/2022 0557   GFRAA >60 04/11/2020 0523   Lipase      Component Value Date/Time   LIPASE 168 (H) 11/04/2022 0622       Studies/Results: No results found.  Anti-infectives: Anti-infectives (From admission, onward)    Start     Dose/Rate Route Frequency Ordered Stop   11/01/22 0900  cefTRIAXone (ROCEPHIN) 2 g in sodium chloride 0.9 % 100 mL IVPB  Status:  Discontinued        2 g 200 mL/hr over 30 Minutes Intravenous Every 24 hours 11/01/22 0750 11/05/22 1136   10/31/22 1100  ceFAZolin (ANCEF) IVPB 2g/100 mL premix        2 g 200 mL/hr over 30 Minutes Intravenous  Once 10/31/22 1059 10/31/22 1230        Assessment/Plan Splenic hemorrhage s/p IR embolization of main splenic artery 2/23 - WBC up to 24.8, afebrile, VSS. Hgb has stabilized. Abdominal pain improving and pt is having bowel function. Given that he is clinically improving suspect leukocytosis is likely reactive from splenic embolization. No indication for surgical intervention. We will sign off, please call with questions or concerns. 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    LOS: 7 days    [facility] 11/06/2022, 8:24 AM Please see Amion for pager number during day hours 7:00am-4:30pm

## 2022-11-09 ENCOUNTER — Emergency Department (HOSPITAL_COMMUNITY): Payer: Medicare PPO

## 2022-11-09 ENCOUNTER — Inpatient Hospital Stay (HOSPITAL_COMMUNITY)
Admission: EM | Admit: 2022-11-09 | Discharge: 2022-11-13 | DRG: 871 | Disposition: A | Discharge status: Hospice | Payer: Medicare PPO | Attending: Internal Medicine | Admitting: Internal Medicine

## 2022-11-09 ENCOUNTER — Inpatient Hospital Stay (HOSPITAL_COMMUNITY): Payer: Medicare PPO

## 2022-11-09 ENCOUNTER — Other Ambulatory Visit: Payer: Self-pay

## 2022-11-09 DIAGNOSIS — K56609 Unspecified intestinal obstruction, unspecified as to partial versus complete obstruction: Secondary | ICD-10-CM | POA: Diagnosis present

## 2022-11-09 DIAGNOSIS — N182 Chronic kidney disease, stage 2 (mild): Secondary | ICD-10-CM | POA: Diagnosis present

## 2022-11-09 DIAGNOSIS — W01119A Fall on same level from slipping, tripping and stumbling with subsequent striking against unspecified sharp object, initial encounter: Secondary | ICD-10-CM | POA: Insufficient documentation

## 2022-11-09 DIAGNOSIS — I129 Hypertensive chronic kidney disease with stage 1 through stage 4 chronic kidney disease, or unspecified chronic kidney disease: Secondary | ICD-10-CM | POA: Diagnosis present

## 2022-11-09 DIAGNOSIS — S3692XA Contusion of unspecified intra-abdominal organ, initial encounter: Secondary | ICD-10-CM | POA: Diagnosis present

## 2022-11-09 DIAGNOSIS — F1021 Alcohol dependence, in remission: Secondary | ICD-10-CM | POA: Diagnosis present

## 2022-11-09 DIAGNOSIS — R0902 Hypoxemia: Secondary | ICD-10-CM | POA: Diagnosis present

## 2022-11-09 DIAGNOSIS — F1020 Alcohol dependence, uncomplicated: Secondary | ICD-10-CM | POA: Diagnosis not present

## 2022-11-09 DIAGNOSIS — K859 Acute pancreatitis without necrosis or infection, unspecified: Secondary | ICD-10-CM | POA: Diagnosis present

## 2022-11-09 DIAGNOSIS — K863 Pseudocyst of pancreas: Secondary | ICD-10-CM | POA: Diagnosis present

## 2022-11-09 DIAGNOSIS — F17210 Nicotine dependence, cigarettes, uncomplicated: Secondary | ICD-10-CM | POA: Diagnosis present

## 2022-11-09 DIAGNOSIS — D735 Infarction of spleen: Secondary | ICD-10-CM | POA: Diagnosis present

## 2022-11-09 DIAGNOSIS — R64 Cachexia: Secondary | ICD-10-CM | POA: Diagnosis present

## 2022-11-09 DIAGNOSIS — E871 Hypo-osmolality and hyponatremia: Secondary | ICD-10-CM | POA: Diagnosis present

## 2022-11-09 DIAGNOSIS — R109 Unspecified abdominal pain: Principal | ICD-10-CM

## 2022-11-09 DIAGNOSIS — R627 Adult failure to thrive: Secondary | ICD-10-CM | POA: Diagnosis present

## 2022-11-09 DIAGNOSIS — R6521 Severe sepsis with septic shock: Secondary | ICD-10-CM | POA: Diagnosis present

## 2022-11-09 DIAGNOSIS — R571 Hypovolemic shock: Secondary | ICD-10-CM | POA: Diagnosis present

## 2022-11-09 DIAGNOSIS — E875 Hyperkalemia: Secondary | ICD-10-CM | POA: Diagnosis present

## 2022-11-09 DIAGNOSIS — K567 Ileus, unspecified: Secondary | ICD-10-CM | POA: Diagnosis present

## 2022-11-09 DIAGNOSIS — A419 Sepsis, unspecified organism: Principal | ICD-10-CM | POA: Diagnosis present

## 2022-11-09 DIAGNOSIS — K860 Alcohol-induced chronic pancreatitis: Secondary | ICD-10-CM | POA: Diagnosis present

## 2022-11-09 DIAGNOSIS — R296 Repeated falls: Secondary | ICD-10-CM | POA: Diagnosis present

## 2022-11-09 DIAGNOSIS — D509 Iron deficiency anemia, unspecified: Secondary | ICD-10-CM | POA: Diagnosis present

## 2022-11-09 DIAGNOSIS — Z6820 Body mass index (BMI) 20.0-20.9, adult: Secondary | ICD-10-CM

## 2022-11-09 DIAGNOSIS — F1011 Alcohol abuse, in remission: Secondary | ICD-10-CM | POA: Diagnosis not present

## 2022-11-09 DIAGNOSIS — K7030 Alcoholic cirrhosis of liver without ascites: Secondary | ICD-10-CM | POA: Diagnosis present

## 2022-11-09 DIAGNOSIS — Z515 Encounter for palliative care: Secondary | ICD-10-CM | POA: Diagnosis not present

## 2022-11-09 DIAGNOSIS — R739 Hyperglycemia, unspecified: Secondary | ICD-10-CM | POA: Diagnosis present

## 2022-11-09 DIAGNOSIS — K7031 Alcoholic cirrhosis of liver with ascites: Secondary | ICD-10-CM | POA: Diagnosis present

## 2022-11-09 DIAGNOSIS — N179 Acute kidney failure, unspecified: Secondary | ICD-10-CM | POA: Diagnosis present

## 2022-11-09 DIAGNOSIS — R34 Anuria and oliguria: Secondary | ICD-10-CM | POA: Diagnosis present

## 2022-11-09 DIAGNOSIS — E43 Unspecified severe protein-calorie malnutrition: Secondary | ICD-10-CM | POA: Diagnosis present

## 2022-11-09 DIAGNOSIS — Z66 Do not resuscitate: Secondary | ICD-10-CM | POA: Diagnosis not present

## 2022-11-09 DIAGNOSIS — Z8619 Personal history of other infectious and parasitic diseases: Secondary | ICD-10-CM

## 2022-11-09 DIAGNOSIS — D75839 Thrombocytosis, unspecified: Secondary | ICD-10-CM | POA: Diagnosis present

## 2022-11-09 DIAGNOSIS — W19XXXA Unspecified fall, initial encounter: Secondary | ICD-10-CM | POA: Diagnosis present

## 2022-11-09 DIAGNOSIS — K828 Other specified diseases of gallbladder: Secondary | ICD-10-CM | POA: Diagnosis present

## 2022-11-09 DIAGNOSIS — Y92009 Unspecified place in unspecified non-institutional (private) residence as the place of occurrence of the external cause: Secondary | ICD-10-CM | POA: Diagnosis not present

## 2022-11-09 DIAGNOSIS — Z79899 Other long term (current) drug therapy: Secondary | ICD-10-CM

## 2022-11-09 DIAGNOSIS — Z9181 History of falling: Secondary | ICD-10-CM | POA: Insufficient documentation

## 2022-11-09 DIAGNOSIS — I4891 Unspecified atrial fibrillation: Secondary | ICD-10-CM | POA: Diagnosis not present

## 2022-11-09 DIAGNOSIS — R6520 Severe sepsis without septic shock: Secondary | ICD-10-CM | POA: Diagnosis not present

## 2022-11-09 DIAGNOSIS — R188 Other ascites: Secondary | ICD-10-CM

## 2022-11-09 LAB — CBC
HCT: 27.6 % — ABNORMAL LOW (ref 39.0–52.0)
Hemoglobin: 8.9 g/dL — ABNORMAL LOW (ref 13.0–17.0)
MCH: 30.9 pg (ref 26.0–34.0)
MCHC: 32.2 g/dL (ref 30.0–36.0)
MCV: 95.8 fL (ref 80.0–100.0)
Platelets: 582 10*3/uL — ABNORMAL HIGH (ref 150–400)
RBC: 2.88 MIL/uL — ABNORMAL LOW (ref 4.22–5.81)
RDW: 15.8 % — ABNORMAL HIGH (ref 11.5–15.5)
WBC: 22.8 10*3/uL — ABNORMAL HIGH (ref 4.0–10.5)
nRBC: 0.3 % — ABNORMAL HIGH (ref 0.0–0.2)

## 2022-11-09 LAB — COMPREHENSIVE METABOLIC PANEL
ALT: 17 U/L (ref 0–44)
AST: 36 U/L (ref 15–41)
Albumin: 1.9 g/dL — ABNORMAL LOW (ref 3.5–5.0)
Alkaline Phosphatase: 125 U/L (ref 38–126)
Anion gap: 8 (ref 5–15)
BUN: 35 mg/dL — ABNORMAL HIGH (ref 8–23)
CO2: 24 mmol/L (ref 22–32)
Calcium: 7.8 mg/dL — ABNORMAL LOW (ref 8.9–10.3)
Chloride: 102 mmol/L (ref 98–111)
Creatinine, Ser: 1.25 mg/dL — ABNORMAL HIGH (ref 0.61–1.24)
GFR, Estimated: >60 mL/min (ref >60)
Glucose, Bld: 136 mg/dL — ABNORMAL HIGH (ref 70–99)
Potassium: 4.3 mmol/L (ref 3.5–5.1)
Sodium: 134 mmol/L — ABNORMAL LOW (ref 135–145)
Total Bilirubin: 2.2 mg/dL — ABNORMAL HIGH (ref 0.3–1.2)
Total Protein: 7.1 g/dL (ref 6.5–8.1)

## 2022-11-09 LAB — TYPE AND SCREEN
ABO/RH(D): O POS
Antibody Screen: NEGATIVE

## 2022-11-09 LAB — CBC WITH DIFFERENTIAL/PLATELET
Abs Immature Granulocytes: 1.24 10*3/uL — ABNORMAL HIGH (ref 0.00–0.07)
Basophils Absolute: 0.1 10*3/uL (ref 0.0–0.1)
Basophils Relative: 0 %
Eosinophils Absolute: 0.1 10*3/uL (ref 0.0–0.5)
Eosinophils Relative: 0 %
HCT: 28.1 % — ABNORMAL LOW (ref 39.0–52.0)
Hemoglobin: 9.3 g/dL — ABNORMAL LOW (ref 13.0–17.0)
Immature Granulocytes: 4 %
Lymphocytes Relative: 5 %
Lymphs Abs: 1.3 10*3/uL (ref 0.7–4.0)
MCH: 31.4 pg (ref 26.0–34.0)
MCHC: 33.1 g/dL (ref 30.0–36.0)
MCV: 94.9 fL (ref 80.0–100.0)
Monocytes Absolute: 1.6 10*3/uL — ABNORMAL HIGH (ref 0.1–1.0)
Monocytes Relative: 6 %
Neutro Abs: 24.5 10*3/uL — ABNORMAL HIGH (ref 1.7–7.7)
Neutrophils Relative %: 85 %
Platelets: 533 10*3/uL — ABNORMAL HIGH (ref 150–400)
RBC: 2.96 MIL/uL — ABNORMAL LOW (ref 4.22–5.81)
RDW: 15.6 % — ABNORMAL HIGH (ref 11.5–15.5)
WBC: 28.8 10*3/uL — ABNORMAL HIGH (ref 4.0–10.5)
nRBC: 0.1 % (ref 0.0–0.2)

## 2022-11-09 LAB — CULTURE, BLOOD (ROUTINE X 2)
Culture: NO GROWTH
Culture: NO GROWTH
Special Requests: ADEQUATE
Special Requests: ADEQUATE

## 2022-11-09 LAB — PROTIME-INR
INR: 1.1 (ref 0.8–1.2)
Prothrombin Time: 14.3 seconds (ref 11.4–15.2)

## 2022-11-09 LAB — I-STAT CHEM 8, ED
BUN: 34 mg/dL — ABNORMAL HIGH (ref 8–23)
Calcium, Ion: 1.08 mmol/L — ABNORMAL LOW (ref 1.15–1.40)
Chloride: 101 mmol/L (ref 98–111)
Creatinine, Ser: 1.2 mg/dL (ref 0.61–1.24)
Glucose, Bld: 132 mg/dL — ABNORMAL HIGH (ref 70–99)
HCT: 31 % — ABNORMAL LOW (ref 39.0–52.0)
Hemoglobin: 10.5 g/dL — ABNORMAL LOW (ref 13.0–17.0)
Potassium: 4.4 mmol/L (ref 3.5–5.1)
Sodium: 135 mmol/L (ref 135–145)
TCO2: 25 mmol/L (ref 22–32)

## 2022-11-09 LAB — LIPASE, BLOOD: Lipase: 164 U/L — ABNORMAL HIGH (ref 11–51)

## 2022-11-09 LAB — CREATININE, SERUM
Creatinine, Ser: 1.29 mg/dL — ABNORMAL HIGH (ref 0.61–1.24)
GFR, Estimated: >60 mL/min (ref >60)

## 2022-11-09 LAB — MAGNESIUM: Magnesium: 2.6 mg/dL — ABNORMAL HIGH (ref 1.7–2.4)

## 2022-11-09 LAB — ETHANOL: Alcohol, Ethyl (B): <10 mg/dL (ref <10)

## 2022-11-09 LAB — PHOSPHORUS: Phosphorus: 4.9 mg/dL — ABNORMAL HIGH (ref 2.5–4.6)

## 2022-11-09 LAB — MRSA NEXT GEN BY PCR, NASAL: MRSA by PCR Next Gen: NOT DETECTED

## 2022-11-09 MED ORDER — CHLORHEXIDINE GLUCONATE CLOTH 2 % EX PADS
6.0000 | MEDICATED_PAD | Freq: Every day | CUTANEOUS | Status: DC
  Administered 2022-11-09: 6 via TOPICAL

## 2022-11-09 MED ORDER — ENOXAPARIN SODIUM 40 MG/0.4ML IJ SOSY
40.0000 mg | PREFILLED_SYRINGE | INTRAMUSCULAR | Status: DC
  Administered 2022-11-09: 40 mg via SUBCUTANEOUS
  Filled 2022-11-09: qty 0.4

## 2022-11-09 MED ORDER — AMIODARONE HCL IN DEXTROSE 360-4.14 MG/200ML-% IV SOLN
30.0000 mg/h | INTRAVENOUS | Status: DC
  Administered 2022-11-10 – 2022-11-11 (×4): 30 mg/h via INTRAVENOUS
  Filled 2022-11-09 (×5): qty 200

## 2022-11-09 MED ORDER — SODIUM CHLORIDE 0.9 % IV SOLN: 8.0000 mg | Freq: Four times a day (QID) | INTRAVENOUS | Status: DC | PRN

## 2022-11-09 MED ORDER — SODIUM CHLORIDE 0.9 % IV BOLUS
500.0000 mL | Freq: Once | INTRAVENOUS | Status: AC
  Administered 2022-11-09: 500 mL via INTRAVENOUS

## 2022-11-09 MED ORDER — LIP MEDEX EX OINT: TOPICAL_OINTMENT | Freq: Two times a day (BID) | CUTANEOUS | Status: DC

## 2022-11-09 MED ORDER — SODIUM CHLORIDE 0.9 % IV SOLN: 100.0000 mg/kg | Freq: Three times a day (TID) | INTRAVENOUS | Status: DC

## 2022-11-09 MED ORDER — IOHEXOL 350 MG/ML SOLN
100.0000 mL | Freq: Once | INTRAVENOUS | Status: AC | PRN
  Administered 2022-11-09: 100 mL via INTRAVENOUS

## 2022-11-09 MED ORDER — MENTHOL 3 MG MT LOZG: 1.0000 | LOZENGE | OROMUCOSAL | Status: DC | PRN

## 2022-11-09 MED ORDER — PHENOL 1.4 % MT LIQD: 2.0000 | OROMUCOSAL | Status: DC | PRN

## 2022-11-09 MED ORDER — ACETAMINOPHEN 325 MG PO TABS
650.0000 mg | ORAL_TABLET | Freq: Four times a day (QID) | ORAL | Status: DC | PRN
  Administered 2022-11-10: 650 mg
  Filled 2022-11-09: qty 2

## 2022-11-09 MED ORDER — LACTATED RINGERS IV BOLUS
1000.0000 mL | Freq: Once | INTRAVENOUS | Status: AC
  Administered 2022-11-09: 1000 mL via INTRAVENOUS

## 2022-11-09 MED ORDER — LACTATED RINGERS IV BOLUS
1000.0000 mL | Freq: Three times a day (TID) | INTRAVENOUS | Status: DC | PRN
  Administered 2022-11-09: 1000 mL via INTRAVENOUS

## 2022-11-09 MED ORDER — VANCOMYCIN HCL 1250 MG/250ML IV SOLN
1250.0000 mg | INTRAVENOUS | Status: AC
  Administered 2022-11-10: 1250 mg via INTRAVENOUS
  Filled 2022-11-09: qty 250

## 2022-11-09 MED ORDER — METHOCARBAMOL 1000 MG/10ML IJ SOLN: 1000.0000 mg | Freq: Four times a day (QID) | INTRAVENOUS | Status: DC | PRN

## 2022-11-09 MED ORDER — PIPERACILLIN-TAZOBACTAM 3.375 G IVPB
3.3750 g | Freq: Three times a day (TID) | INTRAVENOUS | Status: DC
  Administered 2022-11-10 (×3): 3.375 g via INTRAVENOUS
  Filled 2022-11-09 (×3): qty 50

## 2022-11-09 MED ORDER — BISACODYL 10 MG RE SUPP: 10.0000 mg | Freq: Two times a day (BID) | RECTAL | Status: DC | PRN

## 2022-11-09 MED ORDER — SIMETHICONE 40 MG/0.6ML PO SUSP: 80.0000 mg | Freq: Four times a day (QID) | ORAL | Status: DC | PRN

## 2022-11-09 MED ORDER — AMIODARONE LOAD VIA INFUSION
150.0000 mg | Freq: Once | INTRAVENOUS | Status: AC
  Administered 2022-11-09: 150 mg via INTRAVENOUS
  Filled 2022-11-09: qty 83.34

## 2022-11-09 MED ORDER — MAGIC MOUTHWASH: 15.0000 mL | Freq: Four times a day (QID) | ORAL | Status: DC | PRN

## 2022-11-09 MED ORDER — FENTANYL CITRATE PF 50 MCG/ML IJ SOSY
50.0000 ug | PREFILLED_SYRINGE | Freq: Once | INTRAMUSCULAR | Status: AC
  Administered 2022-11-09: 50 ug via INTRAVENOUS
  Filled 2022-11-09: qty 1

## 2022-11-09 MED ORDER — ALUM & MAG HYDROXIDE-SIMETH 200-200-20 MG/5ML PO SUSP: 30.0000 mL | Freq: Four times a day (QID) | ORAL | Status: DC | PRN

## 2022-11-09 MED ORDER — AMIODARONE HCL IN DEXTROSE 360-4.14 MG/200ML-% IV SOLN
60.0000 mg/h | INTRAVENOUS | Status: AC
  Administered 2022-11-09 (×2): 60 mg/h via INTRAVENOUS

## 2022-11-09 MED ORDER — HYDROXYZINE HCL 25 MG PO TABS: 25.0000 mg | ORAL_TABLET | Freq: Three times a day (TID) | ORAL | Status: DC | PRN

## 2022-11-09 MED ORDER — LACTATED RINGERS IV SOLN: INTRAVENOUS | Status: AC

## 2022-11-09 MED ORDER — PIPERACILLIN-TAZOBACTAM 3.375 G IVPB 30 MIN
3.3750 g | INTRAVENOUS | Status: AC
  Administered 2022-11-09: 3.375 g via INTRAVENOUS
  Filled 2022-11-09: qty 50

## 2022-11-09 MED ORDER — ACETAMINOPHEN 650 MG RE SUPP: 650.0000 mg | Freq: Four times a day (QID) | RECTAL | Status: DC | PRN

## 2022-11-09 MED ORDER — LACTATED RINGERS IV SOLN: INTRAVENOUS | Status: DC

## 2022-11-09 MED ORDER — VANCOMYCIN HCL IN DEXTROSE 1-5 GM/200ML-% IV SOLN: 1000.0000 mg | INTRAVENOUS | Status: DC

## 2022-11-09 MED ORDER — FAMOTIDINE IN NACL 20-0.9 MG/50ML-% IV SOLN
20.0000 mg | Freq: Two times a day (BID) | INTRAVENOUS | Status: DC
  Administered 2022-11-09 – 2022-11-10 (×2): 20 mg via INTRAVENOUS
  Filled 2022-11-09 (×2): qty 50

## 2022-11-09 MED ORDER — HYDROMORPHONE HCL 1 MG/ML IJ SOLN
0.5000 mg | INTRAMUSCULAR | Status: DC | PRN
  Administered 2022-11-09: 2 mg via INTRAVENOUS
  Administered 2022-11-10: 0.5 mg via INTRAVENOUS
  Filled 2022-11-09: qty 1
  Filled 2022-11-09: qty 2

## 2022-11-09 MED ORDER — ONDANSETRON HCL 4 MG/2ML IJ SOLN: 4.0000 mg | Freq: Four times a day (QID) | INTRAMUSCULAR | Status: DC | PRN

## 2022-11-09 NOTE — Progress Notes (Signed)
Pharmacy Antibiotic Note  Jared Duran is a 72 y.o. male admitted on 11/09/2022 with sepsis secondary to possible infected intra-abdominal hematoma. Pharmacy has been consulted for Vancomycin and Zosyn dosing.  Plan: Vancomycin '1250mg'$  IV x 1, then 1g IV q24h to keep AUC 400-550. Vancomycin levels at steady state, as indicated. Zosyn 3.375g IV x 1 over 30 minutes, then 3.375g IV q8h (each dose infused over 4 hours). Monitor renal function (daily SCr while on Vancomycin/Zosyn combination), cultures, clinical course      Temp (24hrs), Avg:98.1 F (36.7 C), Min:97.5 F (36.4 C), Max:98.7 F (37.1 C)  Recent Labs  Lab 11/03/22 0259 11/04/22 0622 11/05/22 0557 11/06/22 0648 11/09/22 1440 11/09/22 1510  WBC 14.2* 21.0* 24.1* 24.8* 28.8*  --   CREATININE 0.96 0.96 0.97  --  1.25* 1.20    Estimated Creatinine Clearance: 48.2 mL/min (by C-G formula based on SCr of 1.2 mg/dL).    Allergies  Allergen Reactions   Ethanol Other (See Comments)    Severe pancreatitis & liver disease = NO ALCOHOL    Antimicrobials this admission: 3/3 Vancomycin >> 3/3 Zosyn >>  Dose adjustments this admission: --  Microbiology results: None ordered  Thank you for allowing pharmacy to be a part of this patient's care.   Lindell Spar, PharmD, BCPS Clinical Pharmacist 11/09/2022 7:30 PM

## 2022-11-09 NOTE — Assessment & Plan Note (Signed)
Patient very deconditioned. Hold on nutrition per po for now due to small bowel obstruction and pancreatitis.

## 2022-11-09 NOTE — ED Provider Notes (Signed)
[place] EMERGENCY DEPARTMENT AT [facility] Provider Note   CSN: [id number] Arrival date & time: 11/09/22  1400     History  Chief Complaint  Patient presents with   Abdominal Pain    Jared Duran is a 72 y.o. male.   Abdominal Pain Patient presents abdominal pain and weakness.  Recent admission in the hospital and discharged about 4 days ago.  Had splenic laceration that required embolization.  Also had thoracentesis done for left-sided effusion.  States he did have a fall at home.  More weakness and states he hurts.  States abdomen is more swollen than prior.  Reviewed note from recent admission.  Had a leukocytosis.  No fevers.    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 Authorizing Provider  feeding supplement (ENSURE ENLIVE / ENSURE PLUS) LIQD Take 237 mLs by mouth 2 (two) times daily between meals. 11/06/22 12/06/22 Yes [name], MD  hydrOXYzine (ATARAX) 25 MG tablet Take 25 mg by mouth every 8 (eight) hours as needed for anxiety or itching. 10/10/22  Yes [provider]  losartan (COZAAR) 100 MG tablet Take 100 mg by mouth daily.   Yes [provider]  NIFEdipine (ADALAT CC) 90 MG 24 hr tablet Take 90 mg by mouth daily. 10/27/22  Yes [provider]  pantoprazole (PROTONIX) 20 MG tablet Take 20 mg by mouth daily. 08/11/22  Yes [provider]  polycarbophil (FIBERCON) 625 MG tablet Take 1 tablet (625 mg total) by mouth 2 (two) times daily. 11/06/22 12/06/22 Yes Ghimire, Dante Gang, MD  polyethylene glycol (MIRALAX / GLYCOLAX) 17 g packet Take 17 g by mouth daily as needed for moderate  constipation or mild constipation. 11/06/22  Yes [name], MD  sertraline (ZOLOFT) 25 MG tablet Take 25 mg by mouth daily.   Yes [provider]  oxyCODONE (ROXICODONE) 5 MG immediate release tablet Take 1 tablet (5 mg total) by mouth every 6 (six) hours as needed for up to 5 days for moderate pain or severe pain. Patient not taking: Reported on 11/09/2022 11/06/22 11/11/22  [name], MD      Allergies    Ethanol    Review of Systems   Review of Systems  Gastrointestinal:  Positive for abdominal pain.    Physical Exam Updated Vital Signs BP 112/71   Pulse (!) 113   Temp 98.7 F (37.1 C) (Oral)   Resp (!) 26   SpO2 96%  Physical Exam Vitals and nursing note reviewed.  HENT:     Head: Atraumatic.  Cardiovascular:     Rate and Rhythm: Regular rhythm. Tachycardia present.  Pulmonary:     Breath sounds: No wheezing or rhonchi.  Abdominal:     Tenderness: There is abdominal tenderness.     Hernia: No hernia is present.     Comments: Diffuse abdominal tenderness with distention.  Skin:    General: Skin is warm.  Neurological:     Mental Status: He is alert and oriented to person, place, and time.     ED Results / Procedures / Treatments   Labs (all labs ordered are listed, but only abnormal results are displayed)  Labs Reviewed  CBC WITH DIFFERENTIAL/PLATELET - Abnormal; Notable for the following components:      Result Value   WBC 28.8 (*)    RBC 2.96 (*)    Hemoglobin 9.3 (*)    HCT 28.1 (*)    RDW 15.6 (*)    Platelets 533 (*)    Neutro Abs 24.5 (*)    Monocytes Absolute 1.6 (*)    Abs Immature Granulocytes 1.24 (*)    All other components within normal limits  COMPREHENSIVE METABOLIC PANEL - Abnormal; Notable for the following components:   Sodium 134 (*)    Glucose, Bld 136 (*)    BUN 35 (*)    Creatinine, Ser 1.25 (*)    Calcium 7.8 (*)    Albumin 1.9 (*)    Total Bilirubin 2.2 (*)    All other components within normal limits  I-STAT CHEM  8, ED - Abnormal; Notable for the following components:   BUN 34 (*)    Glucose, Bld 132 (*)    Calcium, Ion 1.08 (*)    Hemoglobin 10.5 (*)    HCT 31.0 (*)    All other components within normal limits  PROTIME-INR  LIPASE, BLOOD  TYPE AND SCREEN    EKG EKG Interpretation  Date/Time:  Sunday November 09 2022 14:31:06 EST Ventricular Rate:  114 PR Interval:  138 QRS Duration: 109 QT Interval:  315 QTC Calculation: 434 R Axis:   -42 Text Interpretation: Sinus tachycardia Left axis deviation Probable anteroseptal infarct, old ST elevation, consider inferior injury Confirmed by Davonna Belling [phone]) on 11/09/2022 2:57:26 PM  Radiology CT LIVER ABDOMEN W WO CONTRAST  Result Date: 11/09/2022 CLINICAL DATA:  Recent splenic rupture with subsequent splenic artery embolization. EXAM: CT ABDOMEN WITHOUT AND WITH CONTRAST TECHNIQUE: Multidetector CT imaging of the abdomen was performed following the standard protocol before and following the bolus administration of intravenous contrast. RADIATION DOSE REDUCTION: This exam was performed according to the departmental dose-optimization program which includes automated exposure control, adjustment of the mA and/or kV according to patient size and/or use of iterative reconstruction technique. CONTRAST:  129m OMNIPAQUE IOHEXOL 350 MG/ML SOLN COMPARISON:  10/30/2022 FINDINGS: Lower chest: There is a small left pleural effusion. Atelectasis involving the left lower lobe and right lower lobe identified. Hepatobiliary: Contour of liver is nodular. Small cyst within dome of liver. Several small subcentimeter low-density foci are identified which are unchanged and likely represent small cysts. Gallbladder appears normal. Unchanged intrahepatic and common bile duct dilatation. CBD measures 9 mm up to the head of the pancreas where changes of chronic pancreatitis are again seen. Pancreas: There are changes of chronic pancreatitis with extensive pancreatic  calcifications. There are several low density fluid collections associated with the head and distal tail of pancreas. Favor pseudo cysts. The largest arises off the tail of pancreas measuring 3.6 x 1.8 cm. Previously this measured 3.7 x 2.3 cm. Interval decrease in peripancreatic inflammation, free fluid in fat stranding. Spleen: Sequelae of splenic rupture identified. Mixed attenuation perisplenic fluid collection is again noted compatible with subacute to chronic hematoma. This measures 7.0 x 8.5 by 6 10.1 cm. There are no signs of current IV contrast extravasation to suggest active hemorrhage. Adrenals/Urinary Tract: Normal adrenal glands. No hydronephrosis, nephrolithiasis or suspicious mass. 2.7 cm Bosniak class 1 cyst arises off the upper pole of the right kidney. No follow-up imaging recommended. Urinary bladder is unremarkable. Stomach/Bowel: Stomach appears normal. There is abnormal proximal small bowel dilatation. The small bowel  loops measure up to 4 cm in diameter. Within the left iliac fossa there is a transition to decreased caliber distal small bowel loops, image 116/6. Distal colonic diverticula noted without signs of acute diverticulitis. Vascular/Lymphatic: Aortic atherosclerosis. No aneurysm. Celiac artery and superior mesenteric arteries scratch set celiac artery is patent. Embolization of the splenic artery noted. SMA and IMA are patent. The SMV is patent. There is luminal narrowing of the portal venous confluence which is favored to represent sequelae of pancreatitis, image 54/11. Extrahepatic and intrahepatic portal vein are patent. Aortic atherosclerosis without aneurysm. Enlarged left retroperitoneal lymph nodes are identified which appear increased from previous exam. Left periaortic node measures 1.2 cm, image 51/11. Previously 1 cm. No pelvic or inguinal adenopathy. Other: There is diffuse edema within the small bowel mesentery. Moderate free fluid within the within the lower abdomen and  pelvis identified. This measures between 8 and 22 Hounsfield units. Previously this measured 30 Hounsfield units. Within the upper abdomen there is a large low-density this communicates with the perisplenic hematoma. This extends across the midline of the abdomen with mass effect upon the right and left lobes of liver which are displaced ventrally and rightward. The fluid collection measures 29.2 By 10.8 by 11.3 cm (volume = 1870 cm^3). Hounsfield units within this fluid collection measure 16.7. Musculoskeletal: No acute or significant osseous findings. IMPRESSION: 1. Sequelae of previous ruptured spleen identified. Mixed attenuation perisplenic fluid collection compatible with subacute to chronic hematoma. No signs of active hemorrhage. 2. There is a large low to intermediate fluid collection within the upper abdomen which communicates with the perisplenic hematoma. This fluid collection measures 29.2 x 10.8 x 11.3 cm (volume = 1870 cc). This fluid collection extends across the midline of the abdomen with mass effect upon the right and left lobes of liver which are displaced ventrally and rightward. This is favored to represent large loculated chronic hematoma versus large pseudocyst which communicates with perisplenic hematoma. 3. Resolving acute on chronic pancreatitis. 4. There is abnormal proximal small bowel dilatation with transition to decreased caliber distal small bowel loops. Findings are concerning for at least a partial small bowel obstruction. 5. Morphologic features of the liver suggestive of cirrhosis. 6. Small left pleural effusion with bilateral lower lobe atelectasis. 7. Chronic pancreatitis. There are several low density fluid collections associated with the head and distal tail of pancreas. Favor pseudocysts. 8. Enlarged left retroperitoneal lymph nodes are identified which appear increased from previous exam. These are favored to be reactive in the setting of recent pancreatitis. 9. Luminal  narrowing of the portal venous confluence is favored to represent sequelae of pancreatitis. 10.  Aortic Atherosclerosis (ICD10-I70.0). Electronically Signed   By: Kerby Moors M.D.   On: 11/09/2022 16:34   DG Chest Portable 1 View  Result Date: 11/09/2022 CLINICAL DATA:  Shortness of breath.  Fall. EXAM: PORTABLE CHEST 1 VIEW COMPARISON:  November 03, 2022 chest x-ray FINDINGS: Possible small effusions with underlying atelectasis, similar in the interval. The cardiomediastinal silhouette is stable. No pneumothorax. No nodules or masses. No interval changes. IMPRESSION: Possible small effusions with underlying atelectasis. No other interval changes. Electronically Signed   By: Dorise Bullion III M.D.   On: 11/09/2022 15:20    Procedures Procedures    Medications Ordered in ED Medications  sodium chloride 0.9 % bolus 500 mL (has no administration in time range)  iohexol (OMNIPAQUE) 350 MG/ML injection 100 mL (100 mLs Intravenous Contrast Given 11/09/22 1542)    ED Course/ Medical Decision  Making/ A&P                             Medical Decision Making Amount and/or Complexity of Data Reviewed Labs: ordered. Radiology: ordered.  Risk Prescription drug management.   Patient with abdominal pain and fall.  Recent mission to hospital with splenic laceration.  Also had leukocytosis that appear to be unclear cause but thought to be reactive.  White count today is more elevated up to 28.  Had been 24 at discharge.  Differential diagnosis includes infection, rebleed, obstruction.  Kidney function good and hemoglobin is actually up from prior.  Will get CT scan.  Discussed with Dr. Clovis Riley from radiology.  Will get the three-phase scan due to the blood already being in abdomen and recent embolization.  CT scan shows fluid collection near the spleen and liver.  Hematoma versus pseudocyst.  Also potential small bowel obstruction.  Discussed the case with Dr. Johney Maine from general surgery.  He thinks it  is more likely pseudocyst.  With the bowel obstruction recommended NG tube and bowel rest.  Also potentially pancreatitis.  Will add lipase.  They will round tomorrow on him.  No plans for surgery.  Hemoglobin is gone up which is a potential sign of dehydration.  Will give IV fluid.  Will admit back to internal medicine.  CRITICAL CARE Performed by: Davonna Belling Total critical care time: 30 minutes Critical care time was exclusive of separately billable procedures and treating other patients. Critical care was necessary to treat or prevent imminent or life-threatening deterioration. Critical care was time spent personally by me on the following activities: development of treatment plan with patient and/or surrogate as well as nursing, discussions with consultants, evaluation of patient's response to treatment, examination of patient, obtaining history from patient or surrogate, ordering and performing treatments and interventions, ordering and review of laboratory studies, ordering and review of radiographic studies, pulse oximetry and re-evaluation of patient's condition.          Final Clinical Impression(s) / ED Diagnoses Final diagnoses:  Abdominal pain, unspecified abdominal location  SBO (small bowel obstruction) ([place])  Intra-abdominal fluid collection    Rx / DC Orders ED Discharge Orders     None         Davonna Belling, MD 11/09/22 1655

## 2022-11-09 NOTE — Progress Notes (Addendum)
       Overnight   NAME: Jared Duran MRN: [id number] DOB : 1951-08-31    Date of Service   11/09/2022   HPI/Events of Note   Notified by RN for (family) request to clarify DNR Bedside visit with family :   Patient is DNR/DNI  CPR -            NO Defibrillation -NO ACLS -          NO Ventilator -     NO                                BiPAP -          YES Vasopressors - NO       Interventions/ Plan   Remain DNR DNI  Continues as previously ordered.      Gershon Cull BSN MSNA MSN ACNPC-AG Acute Care Nurse Practitioner [place]

## 2022-11-09 NOTE — Consult Note (Signed)
Jared Duran  1951-07-21 [id number]  CARE TEAM:  PCP: [name], MD  Outpatient Care Team: Patient Care Team: [name], MD as PCP - General (Family Medicine) [name], MD as Consulting Physician (Gastroenterology) [name], MD as Referring Physician (Surgery)  Inpatient Treatment Team: Treatment Team: Attending Provider: Davonna Belling, MD; Technician: Charolette Forward, NT; Registered Nurse: Kathryne Gin, RN; Consulting Physician: [name], Md, MD   This patient is a 72 y.o.male who presents today for surgical evaluation at the request of Dr Davonna Belling, [facility] ED.   Chief complaint / Reason for evaluation: Recurrent fall with worsening pain in the setting of acute on chronic pancreatitis and cirrhosis with recent admission for spontaneous splenic rupture/hematoma.  72 year old male.  History of alcoholism with liver changes and pancreatitis.  Worsening flares of acute on chronic pancreatitis with pseudocyst.  Claims been abstinent for quite some time.  However recurrent episodes.  Concern for some evidence of developing cirrhosis and perhaps portal hypertension and splenomegaly.  Had episode of fall and pain.  Came to [facility] emergency department with abdominal distention and nausea.  Concern for splenic hematoma/spontaneous rupture with hemoperitoneum versus ascites..  Was admitted by medicine with surgical consultation 2/22.  Underwent interventional radiology splenic artery embolization the following day 2/23.  Hemoglobin stabilized.  Actually on bedrest.  Eventually advanced on diet.  Moderate leukocytosis but no evidence of infection.  Followed by medicine surgery and therapies.  Stabilized and wished to go home a week later.  Patient returns 4 days later.  Notes he fell again.  Feels more distended.  Has had some nausea and vomiting.  Came to [facility] emergency department again.  CT scan shows splenic hematoma rather stable and partially  perfused spleen.  However of large fluid collection walled off in upper abdomen suggestive of giant pseudocyst.  Surgical consultation requested.  Dilated bowel concerning for ileus versus early partial small bowel obstruction.   Assessment  Jared Duran  72 y.o. 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   Acute pancreatitis   Cirrhosis, alcoholic ([facility])   Protein-calorie malnutrition, severe   LFT elevation   Alcohol abuse, in remission   Tobacco use disorder   IDA (iron deficiency anemia)   Syncope, vasovagal   CKD (chronic kidney disease) stage 2, GFR 60-89 ml/min   Hyperglycemia   Nausea & vomiting   History of recent fall   Fall against sharp object   Gallbladder sludge   Recurrent fall in the setting of acute on chronic pancreatitis and splenic rupture/hematoma now with larger contained fluid collections that were abdomen suggestive of worsening acute pancreatitis with giant pseudocysts, SIRS, and probable ileus/partial small bowel obstruction.  Plan:  Readmission.  Nasogastric tube decompression.  Rehydration.   I suspect he is having a SIRS type response from progressive pancreatitis now with giant pseudocysts.  Hopefully with rehydration from his nausea and vomiting his tachycardia and possible early mild shock will stabilize.  Nausea and pain control.  Looking the CAT scan the disrupted spleen rupture/hematoma looks stable.  The fact that his hemoglobin is gone up since discharge most likely is a sign of some dehydration.  No evidence of any active bleeding at this time.  There is no evidence of bowel perforation or pneumatosis.  Reasonable have  type and screen but I do not see any reason for transfusion at this time.    May need to start TPN in the setting severe malnutrition with  albumin less than 2 and recurrent nausea and vomiting.  Some hyperglycemia raises the concern of developing at least prediabetes if not diabetes due to his severe pancreatitis.    Surgery will help follow.  However, he would be extremely high risk for any operative intervention.  There is no indication for splenectomy at this time.  I do not think a washout or pancreatic debridement would serve his interest.  There is no evidence that the pseudocysts are infected.  His gallbladder does not seem to be particularly enlarged nor thickened.  Question of sludge in prior ultrasound but he is in no situation to proceed with cholecystectomy.  I am skeptical his cholecystitis that would warrant percutaneous drainage or intervention at this time.  In the setting of recurrent falls at home, I do not think going to home would be a safe situation.  Consider reevaluation with therapies and social work for skilled facility the next time around.    I reviewed nursing notes, ED provider notes, Consultant interventional radiology notes, hospitalist notes, last 24 h vitals and pain scores, last 48 h intake and output, last 24 h labs and trends, and last 24 h imaging results. I have reviewed this patient's available data, including medical history, events of note, test results, etc as part of my evaluation.  A significant portion of that time was spent in counseling.  Care during the described time interval was provided by me.  This care required high  level of medical decision making.  11/09/2022  Jared Hector, MD, FACS, MASCRS Esophageal, Gastrointestinal & Colorectal Surgery Robotic and Minimally Invasive Surgery  [facility] [id number] [place], [place] [address] [phone] Fax [phone] Main  CONTACT INFORMATION:  Weekday (9AM-5PM): Call [facility] main office at [phone]  Weeknight (5PM-9AM) or Weekend/Holiday: Check [email or web address]  (password " [email or web address]") for General Surgery [facility] coverage  (Please, do not use SecureChat as it is not reliable communication to reach operating surgeons for immediate patient care given surgeries/outpatient duties/clinic/cross-coverage/off post-call which would lead to a delay in care.  Epic staff messaging available for outptient concerns, but may not be answered for 48 hours or more).     11/09/2022      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 ([place]) [id number]   History of Helicobacter pylori infection 2021 10/30/2022   Hypertension    IDA (iron deficiency anemia) 10/30/2022   Pancreatitis     Past Surgical History:  Procedure Laterality Date   ESOPHAGOGASTRODUODENOSCOPY (EGD) WITH PROPOFOL N/A 04/10/2021   Procedure: ESOPHAGOGASTRODUODENOSCOPY (EGD) WITH PROPOFOL;  Surgeon: [name], MD;  Location: [facility] ENDOSCOPY;  Service: Endoscopy;  Laterality: N/A;   EUS N/A 04/10/2021   Procedure: UPPER ENDOSCOPIC ULTRASOUND (EUS) LINEAR;  Surgeon: [name], MD;  Location: [facility] ENDOSCOPY;  Service: Endoscopy;  Laterality: N/A;   IR ANGIOGRAM SELECTIVE EACH ADDITIONAL VESSEL  10/31/2022   IR ANGIOGRAM VISCERAL SELECTIVE  10/31/2022   IR EMBO ART  VEN HEMORR LYMPH EXTRAV  INC GUIDE ROADMAPPING  10/31/2022   IR US GUIDE VASC ACCESS RIGHT  10/31/2022    Social History   Socioeconomic History   Marital status: Legally Separated    Spouse name: Not on file  Number of children: Not on file   Years of education: Not on file   Highest education level: Not on file  Occupational History   Not on file  Tobacco Use   Smoking status: Every Day    Packs/day: 1.00    Types: Cigarettes   Smokeless tobacco: Never  Substance and Sexual Activity   Alcohol use: Not Currently    Comment: occ   Drug use: No   Sexual activity: Not on file  Other Topics Concern   Not on file  Social History Narrative   Not on  file   Social Determinants of Health   Financial Resource Strain: Not on file  Food Insecurity: No Food Insecurity (10/19/2022)   Hunger Vital Sign    Worried About Running Out of Food in the Last Year: Never true    Ran Out of Food in the Last Year: Never true  Transportation Needs: No Transportation Needs (10/19/2022)   PRAPARE - Hydrologist (Medical): No    Lack of Transportation (Non-Medical): No  Physical Activity: Not on file  Stress: Not on file  Social Connections: Not on file  Intimate Partner Violence: Not At Risk (10/19/2022)   Humiliation, Afraid, Rape, and Kick questionnaire    Fear of Current or Ex-Partner: No    Emotionally Abused: No    Physically Abused: No    Sexually Abused: No    No family history on file.  Current Facility-Administered Medications  Medication Dose Route Frequency Provider Last Rate Last Admin   acetaminophen (TYLENOL) suppository 650 mg  650 mg Rectal Q6H PRN Michael Boston, MD       alum & mag hydroxide-simeth (MAALOX/MYLANTA) 200-200-20 MG/5ML suspension 30 mL  30 mL Oral Q6H PRN Michael Boston, MD       bisacodyl (DULCOLAX) suppository 10 mg  10 mg Rectal Q12H PRN Michael Boston, MD       famotidine (PEPCID) IVPB 20 mg premix  20 mg Intravenous Gorden Harms, MD       fentaNYL (SUBLIMAZE) injection 50 mcg  50 mcg Intravenous Once Davonna Belling, MD       HYDROmorphone (DILAUDID) injection 0.5-2 mg  0.5-2 mg Intravenous Q2H PRN Michael Boston, MD       lactated ringers bolus 1,000 mL  1,000 mL Intravenous Once Michael Boston, MD       lactated ringers bolus 1,000 mL  1,000 mL Intravenous Q8H PRN Michael Boston, MD       lip balm (CARMEX) ointment   Topical BID Michael Boston, MD       magic mouthwash  15 mL Oral QID PRN Michael Boston, MD       menthol-cetylpyridinium (CEPACOL) lozenge 3 mg  1 lozenge Oral PRN Michael Boston, MD       methocarbamol (ROBAXIN) 1,000 mg in dextrose 5 % 100 mL IVPB  1,000 mg  Intravenous Q6H PRN Michael Boston, MD       ondansetron [facility]) injection 4 mg  4 mg Intravenous Q6H PRN Michael Boston, MD       Or   ondansetron (ZOFRAN) 8 mg in sodium chloride 0.9 % 50 mL IVPB  8 mg Intravenous Q6H PRN Michael Boston, MD       phenol (CHLORASEPTIC) mouth spray 2 spray  2 spray Mouth/Throat PRN Michael Boston, MD       simethicone (MYLICON) [id number] suspension 80 mg  80 mg Oral QID PRN Michael Boston, MD  sodium chloride 0.9 % bolus 500 mL  500 mL Intravenous Once Davonna Belling, MD       Current Outpatient Medications  Medication Sig Dispense Refill   feeding supplement (ENSURE ENLIVE / ENSURE PLUS) LIQD Take 237 mLs by mouth 2 (two) times daily between meals. 14220 mL 0   hydrOXYzine (ATARAX) 25 MG tablet Take 25 mg by mouth every 8 (eight) hours as needed for anxiety or itching.     losartan (COZAAR) 100 MG tablet Take 100 mg by mouth daily.     NIFEdipine (ADALAT CC) 90 MG 24 hr tablet Take 90 mg by mouth daily.     pantoprazole (PROTONIX) 20 MG tablet Take 20 mg by mouth daily.     polycarbophil (FIBERCON) 625 MG tablet Take 1 tablet (625 mg total) by mouth 2 (two) times daily. 60 tablet 0   polyethylene glycol (MIRALAX / GLYCOLAX) 17 g packet Take 17 g by mouth daily as needed for moderate constipation or mild constipation. 14 each 0   sertraline (ZOLOFT) 25 MG tablet Take 25 mg by mouth daily.     oxyCODONE (ROXICODONE) 5 MG immediate release tablet Take 1 tablet (5 mg total) by mouth every 6 (six) hours as needed for up to 5 days for moderate pain or severe pain. (Patient not taking: Reported on 11/09/2022) 20 tablet 0     Allergies  Allergen Reactions   Ethanol Other (See Comments)    Severe pancreatitis & liver disease = NO ALCOHOL     BP 117/89   Pulse (!) 117   Temp 98.7 F (37.1 C) (Oral)   Resp (!) 23   SpO2 92%     Results:   Labs: Results for orders placed or performed during the hospital encounter of 11/09/22 (from the past 48  hour(s))  CBC with Differential     Status: Abnormal   Collection Time: 11/09/22  2:40 PM  Result Value Ref Range   WBC 28.8 (H) 4.0 - 10.5 K/uL   RBC 2.96 (L) 4.22 - 5.81 MIL/uL   Hemoglobin 9.3 (L) 13.0 - 17.0 g/dL   HCT 28.1 (L) 39.0 - 52.0 %   MCV 94.9 80.0 - 100.0 fL   MCH 31.4 26.0 - 34.0 pg   MCHC 33.1 30.0 - 36.0 g/dL   RDW 15.6 (H) 11.5 - 15.5 %   Platelets 533 (H) 150 - 400 K/uL   nRBC 0.1 0.0 - 0.2 %   Neutrophils Relative % 85 %   Neutro Abs 24.5 (H) 1.7 - 7.7 K/uL   Lymphocytes Relative 5 %   Lymphs Abs 1.3 0.7 - 4.0 K/uL   Monocytes Relative 6 %   Monocytes Absolute 1.6 (H) 0.1 - 1.0 K/uL   Eosinophils Relative 0 %   Eosinophils Absolute 0.1 0.0 - 0.5 K/uL   Basophils Relative 0 %   Basophils Absolute 0.1 0.0 - 0.1 K/uL   WBC Morphology MILD LEFT SHIFT (1-5% METAS, OCC MYELO, OCC BANDS)    Immature Granulocytes 4 %   Abs Immature Granulocytes 1.24 (H) 0.00 - 0.07 K/uL    Comment: Performed at [facility], [place] [address], [place], [place] [address]  Protime-INR     Status: None   Collection Time: 11/09/22  2:40 PM  Result Value Ref Range   Prothrombin Time 14.3 11.4 - 15.2 seconds   INR 1.1 0.8 - 1.2    Comment: (NOTE) INR goal varies based on device and disease states. Performed at [facility]  [place] [address], [place], [place] [address]   Comprehensive metabolic panel     Status: Abnormal   Collection Time: 11/09/22  2:40 PM  Result Value Ref Range   Sodium 134 (L) 135 - 145 mmol/L   Potassium 4.3 3.5 - 5.1 mmol/L   Chloride 102 98 - 111 mmol/L   CO2 24 22 - 32 mmol/L   Glucose, Bld 136 (H) 70 - 99 mg/dL    Comment: Glucose reference range applies only to samples taken after fasting for at least 8 hours.   BUN 35 (H) 8 - 23 mg/dL   Creatinine, Ser 1.25 (H) 0.61 - 1.24 mg/dL   Calcium 7.8 (L) 8.9 - 10.3 mg/dL   Total Protein 7.1 6.5 - 8.1 g/dL   Albumin 1.9 (L) 3.5 - 5.0 g/dL   AST 36 15 - 41 U/L   ALT 17 0 - 44  U/L   Alkaline Phosphatase 125 38 - 126 U/L   Total Bilirubin 2.2 (H) 0.3 - 1.2 mg/dL   GFR, Estimated >60 >60 mL/min    Comment: (NOTE) Calculated using the CKD-EPI Creatinine Equation (2021)    Anion gap 8 5 - 15    Comment: Performed at [facility], [place] [address], [place], [place] [address]  Type and screen [place]     Status: None   Collection Time: 11/09/22  2:41 PM  Result Value Ref Range   ABO/RH(D) O POS    Antibody Screen NEG    Sample Expiration      11/12/2022,2359 Performed at [facility], [place] [address] [place], [place] [address]   Ginger Carne 8, ED     Status: Abnormal   Collection Time: 11/09/22  3:10 PM  Result Value Ref Range   Sodium 135 135 - 145 mmol/L   Potassium 4.4 3.5 - 5.1 mmol/L   Chloride 101 98 - 111 mmol/L   BUN 34 (H) 8 - 23 mg/dL   Creatinine, Ser 1.20 0.61 - 1.24 mg/dL   Glucose, Bld 132 (H) 70 - 99 mg/dL    Comment: Glucose reference range applies only to samples taken after fasting for at least 8 hours.   Calcium, Ion 1.08 (L) 1.15 - 1.40 mmol/L   TCO2 25 22 - 32 mmol/L   Hemoglobin 10.5 (L) 13.0 - 17.0 g/dL   HCT 31.0 (L) 39.0 - 52.0 %    Imaging / Studies: CT LIVER ABDOMEN W WO CONTRAST  Result Date: 11/09/2022 CLINICAL DATA:  Recent splenic rupture with subsequent splenic artery embolization. EXAM: CT ABDOMEN WITHOUT AND WITH CONTRAST TECHNIQUE: Multidetector CT imaging of the abdomen was performed following the standard protocol before and following the bolus administration of intravenous contrast. RADIATION DOSE REDUCTION: This exam was performed according to the departmental dose-optimization program which includes automated exposure control, adjustment of the mA and/or kV according to patient size and/or use of iterative reconstruction technique. CONTRAST:  153m OMNIPAQUE IOHEXOL 350 MG/ML SOLN COMPARISON:  10/30/2022 FINDINGS: Lower chest: There is a small left pleural  effusion. Atelectasis involving the left lower lobe and right lower lobe identified. Hepatobiliary: Contour of liver is nodular. Small cyst within dome of liver. Several small subcentimeter low-density foci are identified which are unchanged and likely represent small cysts. Gallbladder appears normal. Unchanged intrahepatic and common bile duct dilatation. CBD measures 9 mm up to the head of the pancreas where changes of chronic pancreatitis are again seen. Pancreas: There are changes  of chronic pancreatitis with extensive pancreatic calcifications. There are several low density fluid collections associated with the head and distal tail of pancreas. Favor pseudo cysts. The largest arises off the tail of pancreas measuring 3.6 x 1.8 cm. Previously this measured 3.7 x 2.3 cm. Interval decrease in peripancreatic inflammation, free fluid in fat stranding. Spleen: Sequelae of splenic rupture identified. Mixed attenuation perisplenic fluid collection is again noted compatible with subacute to chronic hematoma. This measures 7.0 x 8.5 by 6 10.1 cm. There are no signs of current IV contrast extravasation to suggest active hemorrhage. Adrenals/Urinary Tract: Normal adrenal glands. No hydronephrosis, nephrolithiasis or suspicious mass. 2.7 cm Bosniak class 1 cyst arises off the upper pole of the right kidney. No follow-up imaging recommended. Urinary bladder is unremarkable. Stomach/Bowel: Stomach appears normal. There is abnormal proximal small bowel dilatation. The small bowel loops measure up to 4 cm in diameter. Within the left iliac fossa there is a transition to decreased caliber distal small bowel loops, image 116/6. Distal colonic diverticula noted without signs of acute diverticulitis. Vascular/Lymphatic: Aortic atherosclerosis. No aneurysm. Celiac artery and superior mesenteric arteries scratch set celiac artery is patent. Embolization of the splenic artery noted. SMA and IMA are patent. The SMV is patent. There is  luminal narrowing of the portal venous confluence which is favored to represent sequelae of pancreatitis, image 54/11. Extrahepatic and intrahepatic portal vein are patent. Aortic atherosclerosis without aneurysm. Enlarged left retroperitoneal lymph nodes are identified which appear increased from previous exam. Left periaortic node measures 1.2 cm, image 51/11. Previously 1 cm. No pelvic or inguinal adenopathy. Other: There is diffuse edema within the small bowel mesentery. Moderate free fluid within the within the lower abdomen and pelvis identified. This measures between 8 and 22 Hounsfield units. Previously this measured 30 Hounsfield units. Within the upper abdomen there is a large low-density this communicates with the perisplenic hematoma. This extends across the midline of the abdomen with mass effect upon the right and left lobes of liver which are displaced ventrally and rightward. The fluid collection measures 29.2 By 10.8 by 11.3 cm (volume = 1870 cm^3). Hounsfield units within this fluid collection measure 16.7. Musculoskeletal: No acute or significant osseous findings. IMPRESSION: 1. Sequelae of previous ruptured spleen identified. Mixed attenuation perisplenic fluid collection compatible with subacute to chronic hematoma. No signs of active hemorrhage. 2. There is a large low to intermediate fluid collection within the upper abdomen which communicates with the perisplenic hematoma. This fluid collection measures 29.2 x 10.8 x 11.3 cm (volume = 1870 cc). This fluid collection extends across the midline of the abdomen with mass effect upon the right and left lobes of liver which are displaced ventrally and rightward. This is favored to represent large loculated chronic hematoma versus large pseudocyst which communicates with perisplenic hematoma. 3. Resolving acute on chronic pancreatitis. 4. There is abnormal proximal small bowel dilatation with transition to decreased caliber distal small bowel loops.  Findings are concerning for at least a partial small bowel obstruction. 5. Morphologic features of the liver suggestive of cirrhosis. 6. Small left pleural effusion with bilateral lower lobe atelectasis. 7. Chronic pancreatitis. There are several low density fluid collections associated with the head and distal tail of pancreas. Favor pseudocysts. 8. Enlarged left retroperitoneal lymph nodes are identified which appear increased from previous exam. These are favored to be reactive in the setting of recent pancreatitis. 9. Luminal narrowing of the portal venous confluence is favored to represent sequelae of pancreatitis. 10.  Aortic Atherosclerosis (  ICD10-I70.0). Electronically Signed   By: Kerby Moors M.D.   On: 11/09/2022 16:34   DG Chest Portable 1 View  Result Date: 11/09/2022 CLINICAL DATA:  Shortness of breath.  Fall. EXAM: PORTABLE CHEST 1 VIEW COMPARISON:  November 03, 2022 chest x-ray FINDINGS: Possible small effusions with underlying atelectasis, similar in the interval. The cardiomediastinal silhouette is stable. No pneumothorax. No nodules or masses. No interval changes. IMPRESSION: Possible small effusions with underlying atelectasis. No other interval changes. Electronically Signed   By: Dorise Bullion III M.D.   On: 11/09/2022 15:20   DG Abd Portable 1V  Result Date: 11/04/2022 CLINICAL DATA:  Abdominal pain. EXAM: PORTABLE ABDOMEN - 1 VIEW COMPARISON:  Radiographs 10/14/2018.  CT 11/01/2022. FINDINGS: 0756 hours. Single supine view of the abdomen excludes the upper abdomen. The bowel gas pattern is nonobstructive. There is residual contrast material within the colon near the splenic flexure. A small amount of contrast is present in the urinary bladder. No suspicious abdominal calcifications. The bones appear unchanged. IMPRESSION: No radiographic evidence of acute abdominal process. See recent CT report. Electronically Signed   By: Richardean Sale M.D.   On: 11/04/2022 08:08   US  THORACENTESIS ASP PLEURAL SPACE W/IMG GUIDE  Result Date: 11/03/2022 INDICATION: Patient with history of chronic pancreatitis, recent splenic hemorrhage /embolization of splenic artery, left pleural effusion; request received for diagnostic and therapeutic left thoracentesis EXAM: ULTRASOUND GUIDED DIAGNOSTIC AND THERAPEUTIC LEFT THORACENTESIS MEDICATIONS: 8 ml 1% lidocaine COMPLICATIONS: None immediate. PROCEDURE: An ultrasound guided thoracentesis was thoroughly discussed with the patient and questions answered. The benefits, risks, alternatives and complications were also discussed. The patient understands and wishes to proceed with the procedure. Written consent was obtained. Ultrasound was performed to localize and mark an adequate pocket of fluid in the LEFT chest. The area was then prepped and draped in the normal sterile fashion. 1% Lidocaine was used for local anesthesia. Under ultrasound guidance a 6 Fr Safe-T-Centesis catheter was introduced. Thoracentesis was performed. The catheter was removed and a dressing applied. FINDINGS: A total of approximately 420 mL of hazy, yellow fluid was removed. Samples were sent to the laboratory as requested by the clinical team. IMPRESSION: Successful ultrasound guided diagnostic and therapeutic LEFT thoracentesis yielding 420 mL of pleural fluid. Read by: Rowe Robert, PA-C Electronically Signed   By: Michaelle Birks M.D.   On: 11/03/2022 15:26   DG CHEST PORT 1 VIEW  Result Date: 11/03/2022 CLINICAL DATA:  Thoracentesis EXAM: PORTABLE CHEST 1 VIEW COMPARISON:  Chest radiograph 1 day prior FINDINGS: The cardiomediastinal silhouette is stable. The left pleural effusion has decreased in size following thoracentesis with improved aeration of the left lung base. There is no appreciable pneumothorax The right lung is clear. There is no right pleural effusion or pneumothorax There is no acute osseous abnormality. IMPRESSION: Decreased left pleural effusion with improved  aeration of the left lung following thoracentesis. No appreciable pneumothorax. Electronically Signed   By: Valetta Mole M.D.   On: 11/03/2022 14:56   DG CHEST PORT 1 VIEW  Result Date: 11/02/2022 CLINICAL DATA:  Shortness of breath. Congestive cough. EXAM: PORTABLE CHEST 1 VIEW COMPARISON:  Radiograph 10/30/2022, chest CT 10/19/2022. Lung bases from abdominal CT earlier today FINDINGS: Increasing left pleural effusion with hazy opacity overlying the left lower lung zone. Grossly stable heart size and mediastinal contours. Slight increase in right infrahilar atelectasis. No pneumothorax. No pulmonary edema. IMPRESSION: Increasing left pleural effusion with hazy opacity overlying the left lower lung zone. Increasing  right infrahilar atelectasis. Electronically Signed   By: Keith Rake M.D.   On: 11/02/2022 15:19   CT ABDOMEN PELVIS W CONTRAST  Result Date: 11/01/2022 CLINICAL DATA:  Worsening abdominal pain. Splenic hemorrhage. Status post splenic artery embolization. Pancreatitis. EXAM: CT ABDOMEN AND PELVIS WITH CONTRAST TECHNIQUE: Multidetector CT imaging of the abdomen and pelvis was performed using the standard protocol following bolus administration of intravenous contrast. RADIATION DOSE REDUCTION: This exam was performed according to the departmental dose-optimization program which includes automated exposure control, adjustment of the mA and/or kV according to patient size and/or use of iterative reconstruction technique. CONTRAST:  135m OMNIPAQUE IOHEXOL 300 MG/ML  SOLN COMPARISON:  10/30/2022 FINDINGS: Lower Chest: Increased moderate left pleural effusion and left lower lobe atelectasis. New tiny right pleural effusion. Hepatobiliary: Few small hepatic cysts remains stable. No hepatic masses identified. 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 High attenuation gallbladder sludge again noted, however there is no evidence of  acute cholecystitis. Pancreas: No pancreatic head mass identified. Diffuse pancreatic calcification is again seen, consistent with chronic pancreatitis. A 3.6 cm complex cystic lesion is seen adjacent to the pancreatic tail, which may represent pancreatic pseudocyst or hematoma. 1.0 cm fluid collection adjacent to the pancreatic neck is consistent with a small pseudocyst. Spleen: Complex splenic laceration and perisplenic hematoma are stable since prior study. Stable mild-to-moderate hemoperitoneum. Adrenals/Urinary Tract: No suspicious masses identified. No evidence of ureteral calculi or hydronephrosis. Stomach/Bowel: No evidence of obstruction, inflammatory process or abnormal fluid collections. Normal appendix visualized. Diverticulosis is seen mainly involving the descending and sigmoid colon, however there is no evidence of diverticulitis. Vascular/Lymphatic: No pathologically enlarged lymph nodes. No acute vascular findings. Aortic atherosclerotic calcification incidentally noted. Reproductive:  No mass or other significant abnormality. Other:  Increased diffuse mesenteric and body wall edema. Musculoskeletal:  No suspicious bone lesions identified. IMPRESSION: Stable complex splenic laceration and perisplenic hematoma. Stable mild-to-moderate hemoperitoneum. Stable 3.6 cm complex cystic lesion adjacent to the pancreatic tail, which may represent pancreatic pseudocyst or hematoma. Chronic calcific pancreatitis. 1 cm cystic lesion adjacent to the pancreatic neck, consistent with small pseudocyst. 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 Increased diffuse mesenteric and body wall edema. Increased moderate left pleural effusion and left lower lobe atelectasis. New tiny right pleural effusion. Colonic diverticulosis, without radiographic evidence of diverticulitis. Aortic Atherosclerosis (ICD10-I70.0). Electronically Signed   By: JMarlaine Hind M.D.   On: 11/01/2022 15:04   IR [place] Vasc Access Right  Result Date: 10/31/2022 INDICATION: Pancreatitis and spontaneous splenic hemorrhage with parenchymal subcapsular hematoma. Hemoglobin has not responded appropriately to blood transfusion and the patient now presents for splenic artery embolization. EXAM: 1. ULTRASOUND GUIDANCE FOR VASCULAR ACCESS OF THE RIGHT COMMON FEMORAL ARTERY 2. VISCERAL SELECTIVE ARTERIOGRAPHY OF THE SPLENIC ARTERY 3. ADDITIONAL SELECTIVE ARTERIOGRAPHY OF THE DISTAL SPLENIC ARTERY 4. TRANSCATHETER EMBOLIZATION OF THE SPLENIC ARTERY TO TREAT ARTERIAL HEMORRHAGE MEDICATIONS: 2 g IV Ancef. The antibiotic was administered within 1 hour of the procedure ANESTHESIA/SEDATION: Moderate (conscious) sedation was employed during this procedure. A total of Versed 2.0 mg and Fentanyl 100 mcg was administered intravenously. Moderate Sedation Time: 55 minutes. The patient's level of consciousness and vital signs were monitored continuously by radiology nursing throughout the procedure under my direct supervision. CONTRAST:  25 mL Visipaque 320 FLUOROSCOPY TIME:  Radiation Exposure Index (as provided by the fluoroscopic device): [id number] Kerma COMPLICATIONS: None immediate. PROCEDURE: Informed  consent was obtained from the patient following explanation of the procedure, risks, benefits and alternatives. The patient understands, agrees and consents for the procedure. All questions were addressed. A time out was performed prior to the initiation of the procedure. Maximal barrier sterile technique utilized including caps, mask, sterile gowns, sterile gloves, large sterile drape, hand hygiene, and chlorhexidine prep. During the procedure local anesthesia was provided with 1% lidocaine. Ultrasound was used to confirm patency of the right common femoral artery. A permanent ultrasound image was saved and recorded. The right common femoral artery was accessed utilizing a micropuncture set. A 5 French  sheath was placed. A 5 French Cobra catheter was advanced into the abdominal aorta. The catheter was used to selectively catheterize the celiac axis. The catheter was then used to selectively catheterize the proximal splenic artery. Selective splenic arteriography was performed. A microcatheter was advanced through the 5 French catheter and further advanced into the distal aspect of the splenic artery. Additional selective splenic arteriography was performed. Embolization was performed through the microcatheter with initial deployment of a Medtronic MVP 5 vascular plug. Additional arteriography was performed through the microcatheter as well as periodic injection of contrast material under fluoroscopy to assess flow in the splenic artery. The microcatheter was removed. The 5 French catheter was then further advanced into the splenic artery. Additional arteriography was performed. An MVP 7 vascular plug was then deployed through the 5 [place] catheter. Additional arteriography was then performed through the 5 French catheter. The catheter was then removed. Hemostasis was obtained at the sheath access site with the Angio-Seal device. FINDINGS: Initial splenic arteriography demonstrates a tortuous splenic artery supplying an enlarged spleen. Intra-splenic branches are beaded and tortuous especially in the lower pole of the spleen. There is some displacement of vessels in the central aspect of the splenic parenchyma likely related to intraparenchymal hemorrhage. No evidence of overt contrast extravasation or arterial pseudoaneurysm. Additional selective arteriography was performed near the splenic hilum in order to define bifurcation point of the main splenic artery. After placement of a single vascular plug, there was still flow noted around the plug and eventually the plug migrated in the artery slightly towards the splenic hilum. A second, larger vascular plug was therefore deployed in the distal splenic artery with  significant reduction in flow and adequate reduce flow noted. IMPRESSION: Successful embolization of the splenic artery with placement of two separate vascular plugs. Electronically Signed   By: [name] M.D.   On: 10/31/2022 16:43   IR Angiogram Visceral Selective  Result Date: 10/31/2022 INDICATION: Pancreatitis and spontaneous splenic hemorrhage with parenchymal subcapsular hematoma. Hemoglobin has not responded appropriately to blood transfusion and the patient now presents for splenic artery embolization. EXAM: 1. ULTRASOUND GUIDANCE FOR VASCULAR ACCESS OF THE RIGHT COMMON FEMORAL ARTERY 2. VISCERAL SELECTIVE ARTERIOGRAPHY OF THE SPLENIC ARTERY 3. ADDITIONAL SELECTIVE ARTERIOGRAPHY OF THE DISTAL SPLENIC ARTERY 4. TRANSCATHETER EMBOLIZATION OF THE SPLENIC ARTERY TO TREAT ARTERIAL HEMORRHAGE MEDICATIONS: 2 g IV Ancef. The antibiotic was administered within 1 hour of the procedure ANESTHESIA/SEDATION: Moderate (conscious) sedation was employed during this procedure. A total of Versed 2.0 mg and Fentanyl 100 mcg was administered intravenously. Moderate Sedation Time: 55 minutes. The patient's level of consciousness and vital signs were monitored continuously by radiology nursing throughout the procedure under my direct supervision. CONTRAST:  25 mL Visipaque 320 FLUOROSCOPY TIME:  Radiation Exposure Index (as provided by the fluoroscopic device): [id number] mGy Kerma COMPLICATIONS: None immediate. PROCEDURE: Informed consent was obtained from the patient  following explanation of the procedure, risks, benefits and alternatives. The patient understands, agrees and consents for the procedure. All questions were addressed. A time out was performed prior to the initiation of the procedure. Maximal barrier sterile technique utilized including caps, mask, sterile gowns, sterile gloves, large sterile drape, hand hygiene, and chlorhexidine prep. During the procedure local anesthesia was provided with 1% lidocaine.  Ultrasound was used to confirm patency of the right common femoral artery. A permanent ultrasound image was saved and recorded. The right common femoral artery was accessed utilizing a micropuncture set. A 5 French sheath was placed. A 5 French Cobra catheter was advanced into the abdominal aorta. The catheter was used to selectively catheterize the celiac axis. The catheter was then used to selectively catheterize the proximal splenic artery. Selective splenic arteriography was performed. A microcatheter was advanced through the 5 French catheter and further advanced into the distal aspect of the splenic artery. Additional selective splenic arteriography was performed. Embolization was performed through the microcatheter with initial deployment of a Medtronic MVP 5 vascular plug. Additional arteriography was performed through the microcatheter as well as periodic injection of contrast material under fluoroscopy to assess flow in the splenic artery. The microcatheter was removed. The 5 French catheter was then further advanced into the splenic artery. Additional arteriography was performed. An MVP 7 vascular plug was then deployed through the 5 [place] catheter. Additional arteriography was then performed through the 5 French catheter. The catheter was then removed. Hemostasis was obtained at the sheath access site with the Angio-Seal device. FINDINGS: Initial splenic arteriography demonstrates a tortuous splenic artery supplying an enlarged spleen. Intra-splenic branches are beaded and tortuous especially in the lower pole of the spleen. There is some displacement of vessels in the central aspect of the splenic parenchyma likely related to intraparenchymal hemorrhage. No evidence of overt contrast extravasation or arterial pseudoaneurysm. Additional selective arteriography was performed near the splenic hilum in order to define bifurcation point of the main splenic artery. After placement of a single vascular plug,  there was still flow noted around the plug and eventually the plug migrated in the artery slightly towards the splenic hilum. A second, larger vascular plug was therefore deployed in the distal splenic artery with significant reduction in flow and adequate reduce flow noted. IMPRESSION: Successful embolization of the splenic artery with placement of two separate vascular plugs. Electronically Signed   By: [name] M.D.   On: 10/31/2022 16:43   IR Angiogram Selective Each Additional Vessel  Result Date: 10/31/2022 INDICATION: Pancreatitis and spontaneous splenic hemorrhage with parenchymal subcapsular hematoma. Hemoglobin has not responded appropriately to blood transfusion and the patient now presents for splenic artery embolization. EXAM: 1. ULTRASOUND GUIDANCE FOR VASCULAR ACCESS OF THE RIGHT COMMON FEMORAL ARTERY 2. VISCERAL SELECTIVE ARTERIOGRAPHY OF THE SPLENIC ARTERY 3. ADDITIONAL SELECTIVE ARTERIOGRAPHY OF THE DISTAL SPLENIC ARTERY 4. TRANSCATHETER EMBOLIZATION OF THE SPLENIC ARTERY TO TREAT ARTERIAL HEMORRHAGE MEDICATIONS: 2 g IV Ancef. The antibiotic was administered within 1 hour of the procedure ANESTHESIA/SEDATION: Moderate (conscious) sedation was employed during this procedure. A total of Versed 2.0 mg and Fentanyl 100 mcg was administered intravenously. Moderate Sedation Time: 55 minutes. The patient's level of consciousness and vital signs were monitored continuously by radiology nursing throughout the procedure under my direct supervision. CONTRAST:  25 mL Visipaque 320 FLUOROSCOPY TIME:  Radiation Exposure Index (as provided by the fluoroscopic device): [id number] mGy Kerma COMPLICATIONS: None immediate. PROCEDURE: Informed consent was obtained from the patient following explanation of the  procedure, risks, benefits and alternatives. The patient understands, agrees and consents for the procedure. All questions were addressed. A time out was performed prior to the initiation of the procedure.  Maximal barrier sterile technique utilized including caps, mask, sterile gowns, sterile gloves, large sterile drape, hand hygiene, and chlorhexidine prep. During the procedure local anesthesia was provided with 1% lidocaine. Ultrasound was used to confirm patency of the right common femoral artery. A permanent ultrasound image was saved and recorded. The right common femoral artery was accessed utilizing a micropuncture set. A 5 French sheath was placed. A 5 French Cobra catheter was advanced into the abdominal aorta. The catheter was used to selectively catheterize the celiac axis. The catheter was then used to selectively catheterize the proximal splenic artery. Selective splenic arteriography was performed. A microcatheter was advanced through the 5 French catheter and further advanced into the distal aspect of the splenic artery. Additional selective splenic arteriography was performed. Embolization was performed through the microcatheter with initial deployment of a Medtronic MVP 5 vascular plug. Additional arteriography was performed through the microcatheter as well as periodic injection of contrast material under fluoroscopy to assess flow in the splenic artery. The microcatheter was removed. The 5 French catheter was then further advanced into the splenic artery. Additional arteriography was performed. An MVP 7 vascular plug was then deployed through the 5 [place] catheter. Additional arteriography was then performed through the 5 French catheter. The catheter was then removed. Hemostasis was obtained at the sheath access site with the Angio-Seal device. FINDINGS: Initial splenic arteriography demonstrates a tortuous splenic artery supplying an enlarged spleen. Intra-splenic branches are beaded and tortuous especially in the lower pole of the spleen. There is some displacement of vessels in the central aspect of the splenic parenchyma likely related to intraparenchymal hemorrhage. No evidence of overt  contrast extravasation or arterial pseudoaneurysm. Additional selective arteriography was performed near the splenic hilum in order to define bifurcation point of the main splenic artery. After placement of a single vascular plug, there was still flow noted around the plug and eventually the plug migrated in the artery slightly towards the splenic hilum. A second, larger vascular plug was therefore deployed in the distal splenic artery with significant reduction in flow and adequate reduce flow noted. IMPRESSION: Successful embolization of the splenic artery with placement of two separate vascular plugs. Electronically Signed   By: [name] M.D.   On: 10/31/2022 16:43   IR EMBO ART  VEN HEMORR LYMPH EXTRAV  INC GUIDE ROADMAPPING  Result Date: 10/31/2022 INDICATION: Pancreatitis and spontaneous splenic hemorrhage with parenchymal subcapsular hematoma. Hemoglobin has not responded appropriately to blood transfusion and the patient now presents for splenic artery embolization. EXAM: 1. ULTRASOUND GUIDANCE FOR VASCULAR ACCESS OF THE RIGHT COMMON FEMORAL ARTERY 2. VISCERAL SELECTIVE ARTERIOGRAPHY OF THE SPLENIC ARTERY 3. ADDITIONAL SELECTIVE ARTERIOGRAPHY OF THE DISTAL SPLENIC ARTERY 4. TRANSCATHETER EMBOLIZATION OF THE SPLENIC ARTERY TO TREAT ARTERIAL HEMORRHAGE MEDICATIONS: 2 g IV Ancef. The antibiotic was administered within 1 hour of the procedure ANESTHESIA/SEDATION: Moderate (conscious) sedation was employed during this procedure. A total of Versed 2.0 mg and Fentanyl 100 mcg was administered intravenously. Moderate Sedation Time: 55 minutes. The patient's level of consciousness and vital signs were monitored continuously by radiology nursing throughout the procedure under my direct supervision. CONTRAST:  25 mL Visipaque 320 FLUOROSCOPY TIME:  Radiation Exposure Index (as provided by the fluoroscopic device): [id number] mGy Kerma COMPLICATIONS: None immediate. PROCEDURE: Informed consent was obtained from the  patient following  explanation of the procedure, risks, benefits and alternatives. The patient understands, agrees and consents for the procedure. All questions were addressed. A time out was performed prior to the initiation of the procedure. Maximal barrier sterile technique utilized including caps, mask, sterile gowns, sterile gloves, large sterile drape, hand hygiene, and chlorhexidine prep. During the procedure local anesthesia was provided with 1% lidocaine. Ultrasound was used to confirm patency of the right common femoral artery. A permanent ultrasound image was saved and recorded. The right common femoral artery was accessed utilizing a micropuncture set. A 5 French sheath was placed. A 5 French Cobra catheter was advanced into the abdominal aorta. The catheter was used to selectively catheterize the celiac axis. The catheter was then used to selectively catheterize the proximal splenic artery. Selective splenic arteriography was performed. A microcatheter was advanced through the 5 French catheter and further advanced into the distal aspect of the splenic artery. Additional selective splenic arteriography was performed. Embolization was performed through the microcatheter with initial deployment of a Medtronic MVP 5 vascular plug. Additional arteriography was performed through the microcatheter as well as periodic injection of contrast material under fluoroscopy to assess flow in the splenic artery. The microcatheter was removed. The 5 French catheter was then further advanced into the splenic artery. Additional arteriography was performed. An MVP 7 vascular plug was then deployed through the 5 [place] catheter. Additional arteriography was then performed through the 5 French catheter. The catheter was then removed. Hemostasis was obtained at the sheath access site with the Angio-Seal device. FINDINGS: Initial splenic arteriography demonstrates a tortuous splenic artery supplying an enlarged spleen.  Intra-splenic branches are beaded and tortuous especially in the lower pole of the spleen. There is some displacement of vessels in the central aspect of the splenic parenchyma likely related to intraparenchymal hemorrhage. No evidence of overt contrast extravasation or arterial pseudoaneurysm. Additional selective arteriography was performed near the splenic hilum in order to define bifurcation point of the main splenic artery. After placement of a single vascular plug, there was still flow noted around the plug and eventually the plug migrated in the artery slightly towards the splenic hilum. A second, larger vascular plug was therefore deployed in the distal splenic artery with significant reduction in flow and adequate reduce flow noted. IMPRESSION: Successful embolization of the splenic artery with placement of two separate vascular plugs. Electronically Signed   By: [name] M.D.   On: 10/31/2022 16:43   CT Angio Abd/Pel w/ and/or w/o  Result Date: 10/30/2022 CLINICAL DATA:  Splenomegaly, history of pancreatitis, abdominal pain, evidence of hemorrhage on previous CT EXAM: CTA ABDOMEN AND PELVIS WITHOUT AND WITH CONTRAST TECHNIQUE: Multidetector CT imaging of the abdomen and pelvis was performed using the standard protocol during bolus administration of intravenous contrast. Multiplanar reconstructed images and MIPs were obtained and reviewed to evaluate the vascular anatomy. RADIATION DOSE REDUCTION: This exam was performed according to the departmental dose-optimization program which includes automated exposure control, adjustment of the mA and/or kV according to patient size and/or use of iterative reconstruction technique. CONTRAST:  80m OMNIPAQUE IOHEXOL 350 MG/ML SOLN COMPARISON:  10/30/2022, 10/20/2022, 10/19/2022 FINDINGS: VASCULAR Aorta: Normal caliber aorta without aneurysm, dissection, vasculitis or significant stenosis. Atherosclerosis. Celiac: Patent without evidence of aneurysm,  dissection, vasculitis or significant stenosis. SMA: Patent without evidence of aneurysm, dissection, vasculitis or significant stenosis. Renals: Both renal arteries are patent without evidence of aneurysm, dissection, vasculitis, fibromuscular dysplasia or significant stenosis. IMA: Patent without evidence of aneurysm, dissection, vasculitis or  significant stenosis. Inflow: Patent without evidence of aneurysm, dissection, vasculitis or significant stenosis. Proximal Outflow: Bilateral common femoral and visualized portions of the superficial and profunda femoral arteries are patent without evidence of aneurysm, dissection, vasculitis or significant stenosis. Veins: There is mild extrinsic compression upon the main portal vein due to the inflammatory changes of the pancreas. 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 Remaining venous structures are unremarkable. Review of the MIP images confirms the above findings. NON-VASCULAR Lower chest: Trace left pleural effusion. No acute airspace disease. Hepatobiliary: Stable intrahepatic and extrahepatic biliary duct dilation, with common bile duct measuring up to 11 mm. There is narrowing of the downstream common bile duct in the region of the pancreatic head. Minimal gallbladder sludge without evidence of cholecystitis. Stable benign hepatic cysts. Pancreas: Stable parenchymal calcifications within the head and body of the pancreas. There is decreased parenchymal enhancement within the region of the pancreatic head, consistent with edema and pancreatitis. Marked peripancreatic inflammatory change. There is a complex cystic structure adjacent to the tail the pancreas, measuring 4.1 x 2.6 cm, compatible with the hemorrhagic pseudocyst seen on previous imaging. No evidence of contrast extravasation to suggest active hemorrhage. Spleen: The spleen remains enlarged  and heterogeneous, measuring approximately 10.0 x 7.7 by 10.5 cm in size. The diffuse heterogeneity of the splenic parenchyma seen previously is again noted, consistent with intracapsular hemorrhage. There is no evidence of contrast extravasation to suggest active bleeding. While no distinct capsular defect is identified, significant perisplenic fat stranding in the left upper quadrant as well as the high attenuation fluid throughout the abdomen and pelvis is concerning for splenic rupture. Adrenals/Urinary Tract: Stable right renal atrophy. No urinary tract calculi or obstruction. Bladder is unremarkable. The adrenals are normal. Stomach/Bowel: No bowel obstruction or ileus. No bowel wall thickening or inflammatory change. Lymphatic: No pathologic adenopathy within the abdomen or pelvis. Reproductive: Stable appearance of the prostate. Other: High attenuation ascites throughout the abdomen and pelvis is unchanged, consistent with hemoperitoneum. No free intraperitoneal gas. No abdominal wall hernia. Musculoskeletal: No acute or destructive bony lesions. Reconstructed images demonstrate no additional findings. IMPRESSION: VASCULAR 1. No evidence of contrast extravasation to suggest active intra-abdominal or intrapelvic hemorrhage. 2. 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 3.  Aortic Atherosclerosis (ICD10-I70.0). NON-VASCULAR 1. Stable heterogeneous enlargement of the spleen, with perisplenic fat stranding and high attenuation fluid throughout the abdomen and pelvis consistent with intra splenic hemorrhage and likely capsular rupture. 2. Stable changes of pancreatitis, with heterogeneous fluid collection at the pancreatic tail consistent with hemorrhagic pseudocyst. 3. Stable high attenuation free fluid throughout the abdomen and pelvis compatible with  hemoperitoneum. 4. Stable intrahepatic and extrahepatic biliary duct dilation, with narrowing of the downstream common bile duct at the level of the pancreatic head. Please see recent MRI evaluation. 5. Gallbladder sludge without cholelithiasis or cholecystitis. 6. Trace left pleural effusion. Electronically Signed   By: Randa Ngo M.D.   On: 10/30/2022 22:37   CT ABDOMEN PELVIS WO CONTRAST  Result Date: 10/30/2022 CLINICAL DATA:  Abdominal pain and syncope, history of pancreatitis, initial encounter EXAM: CT ABDOMEN AND PELVIS WITHOUT CONTRAST TECHNIQUE: Multidetector CT imaging of the abdomen and pelvis was performed following the standard protocol without IV contrast. RADIATION DOSE REDUCTION: This exam was performed according  to the departmental dose-optimization program which includes automated exposure control, adjustment of the mA and/or kV according to patient size and/or use of iterative reconstruction technique. COMPARISON:  10/19/2022 FINDINGS: Lower chest: Lung bases are free of acute infiltrate or sizable effusion. Cardiac blood pool is decreased in attenuation consistent with underlying anemia. Hepatobiliary: Gallbladder is within normal limits. Liver shows no focal abnormality. Very mild Peri hepatic fluid is noted. Pancreas: Diffuse calcifications are noted in the head and uncinate process and to a lesser degree within the body of the pancreas consistent with chronic pancreatitis. Superimposed inflammatory change in the pancreas is noted consistent with acute on chronic pancreatitis. Persistent cystic lesion is noted within the tail of the pancreas again measuring approximately 2.4 cm. This however demonstrates increased density when compared with the recent CT likely related to some underlying hemorrhage within. Spleen: The spleen is significantly enlarged when compare with the prior exam. The previously seen perisplenic fluid collection is not well visualized. These changes are likely  related to interval splenic hemorrhage. These changes would correspond with the patient's given clinical history of increased pain. Adrenals/Urinary Tract: Adrenal glands are within normal limits. Kidneys demonstrate no renal calculi or obstructive changes. Simple cyst is noted in the upper pole of the right kidney. No further follow-up is recommended. The bladder is decompressed. Stomach/Bowel: The appendix is not well visualized. No inflammatory changes to suggest appendicitis are noted. Small bowel and stomach appear within normal limits. Vascular/Lymphatic: Aortic atherosclerosis. No enlarged abdominal or pelvic lymph nodes. Reproductive: Pancreas appears within normal limits. Other: Considerable free fluid is noted which is of greater density than that expected for free fluid and with what appears to be a hematocrit level deep in the pelvis. These changes are consistent with intra-abdominal hemorrhage. Musculoskeletal: Degenerative changes of lumbar spine are noted. No acute bony abnormality is seen. IMPRESSION: Changes consistent with persistent acute pancreatitis. There has been interval hemorrhage within the cystic lesion within the tail the pancreas when compared with the prior CT examination. The spleen is now significantly enlarged and the splenic parenchyma is not well delineated. Given the perisplenic fluid seen previously and increase in free abdominal fluid with hematocrit, these changes are felt to be related to splenic hemorrhage and likely rupture into the peritoneal cavity. Changes of underlying anemia with decreased density of the cardiac blood pool. Critical Value/emergent results were called by telephone at the time of interpretation on 10/30/2022 at 8:28 pm to Dr. Sherwood Gambler , who verbally acknowledged these results. Electronically Signed   By: Inez Catalina M.D.   On: 10/30/2022 20:37   DG Chest Portable 1 View  Result Date: 10/30/2022 CLINICAL DATA:  Syncope.  Nausea.  Vomiting. EXAM:  PORTABLE CHEST 1 VIEW COMPARISON:  CXR 10/19/22 FINDINGS: No pleural effusion. No pneumothorax. No focal airspace opacity. Normal cardiac and mediastinal contours. No radiographically apparent displaced rib fractures. Visualized upper abdomen is unremarkable. IMPRESSION: No acute cardiopulmonary disease. Electronically Signed   By: Marin Roberts M.D.   On: 10/30/2022 18:22   MR ABDOMEN MRCP W WO CONTAST  Result Date: 10/20/2022 CLINICAL DATA:  Sequelae of chronic pancreatitis on recent CT. EXAM: MRI ABDOMEN WITHOUT AND WITH CONTRAST (INCLUDING MRCP) TECHNIQUE: Multiplanar multisequence MR imaging of the abdomen was performed both before and after the administration of intravenous contrast. Heavily T2-weighted images of the biliary and pancreatic ducts were obtained, and three-dimensional MRCP images were rendered by post processing. CONTRAST:  87m GADAVIST GADOBUTROL 1 MMOL/ML IV SOLN COMPARISON:  CTA 10/19/2022.  MRI/MRCP 02/10/2021 FINDINGS: Lower chest: Left base atelectasis with left pleural effusion. Hepatobiliary: 6 mm cyst noted in the dome of the liver with 9 mm simple cyst in the anterior right liver. No followup imaging is recommended. Gallbladder is distended with some dependent sludge but no stones evident. Intra and extrahepatic biliary duct dilatation is similar to previous MRI. Common duct measured previously at 12 mm is 11 mm today. Common bile duct just proximal to the ampulla is 10 mm today compared to 10 mm previously. As noted previously, there is abrupt tapering of the pancreatic duct in the head of the pancreas. No evidence for choledocholithiasis. Pancreas: Pancreatic head is prominent although the marked calcification seen on CT is not evident on MRI. 13 mm simple appearing cyst noted in the anterior pancreatic head, new in the interval since previous MRI. A second cyst in the tail of the pancreas measures 3.5 cm, showing thin smooth rim enhancement and no evidence for internal septation  although there is some debris within the collection. Cyst is relatively homogeneous and low signal intensity on T1 with high signal intensity on T2 imaging. No main duct dilatation in the pancreas. Spleen: No splenomegaly. No focal mass lesion. Small collection of fluid posterior to the spleen may be loculated. There is a 4.5 x 2.0 cm rim enhancing collection lateral to the spleen that extends up into the lateral aspect of the subdiaphragmatic space. Adrenals/Urinary Tract: No adrenal nodule or mass. Simple cyst noted upper pole right kidney. No followup imaging is recommended. No suspicious abnormality in the left kidney. No hydronephrosis. Stomach/Bowel: Stomach is unremarkable. No gastric wall thickening. No evidence of outlet obstruction. Duodenum is normally positioned as is the ligament of Treitz. Thickening in jejunal small bowel loops of the left upper quadrant is likely secondary. Vascular/Lymphatic: No abdominal aortic aneurysm. Small hepatoduodenal ligament and retroperitoneal lymph nodes evident. Other: Small volume ascites noted with mesenteric edema. 5.4 x 1.8 cm loculated rim enhancing collections identified lateral to the splenic flexure of the colon. Musculoskeletal: No focal suspicious marrow enhancement within the visualized bony anatomy. IMPRESSION: 1. Stable intra and extrahepatic biliary duct dilatation with abrupt tapering of the pancreatic duct in the head of the pancreas. No evidence for choledocholithiasis. No discernible obstructing mass lesion. 2. The marked pancreatic parenchymal calcification seen on CT is not evident on MRI. No main duct dilatation in the pancreas. 3. 3.5 cm cyst in the tail of the pancreas showing thin smooth rim enhancement and some debris within the collection. A second 13 mm cyst identified in the head of the pancreas. These are likely pseudocysts although superinfection cannot be excluded. 4. 4.5 x 2.0 cm rim enhancing fluid collection identified lateral to the  spleen with probable loculated fluid posterior to the spleen as well. An additional 5.4 x 1.8 cm loculated rim enhancing collection identified lateral to the splenic flexure of the colon. These may represent pseudocysts although abscess is a possibility. 5. Distended gallbladder with layering sludge. No gallbladder wall thickening or evidence of gallstones. 6. Small volume ascites with mesenteric edema. 7. Left base atelectasis with left pleural effusion. 8. Thickening in jejunal small bowel loops of the left upper quadrant is likely secondary. Electronically Signed   By: Misty Stanley M.D.   On: 10/20/2022 08:40   MR 3D Recon At Scanner  Result Date: 10/20/2022 CLINICAL DATA:  Sequelae of chronic pancreatitis on recent CT. EXAM: MRI ABDOMEN WITHOUT AND WITH CONTRAST (INCLUDING MRCP) TECHNIQUE: Multiplanar multisequence MR imaging of the abdomen  was performed both before and after the administration of intravenous contrast. Heavily T2-weighted images of the biliary and pancreatic ducts were obtained, and three-dimensional MRCP images were rendered by post processing. CONTRAST:  80m GADAVIST GADOBUTROL 1 MMOL/ML IV SOLN COMPARISON:  CTA 10/19/2022.  MRI/MRCP 02/10/2021 FINDINGS: Lower chest: Left base atelectasis with left pleural effusion. Hepatobiliary: 6 mm cyst noted in the dome of the liver with 9 mm simple cyst in the anterior right liver. No followup imaging is recommended. Gallbladder is distended with some dependent sludge but no stones evident. Intra and extrahepatic biliary duct dilatation is similar to previous MRI. Common duct measured previously at 12 mm is 11 mm today. Common bile duct just proximal to the ampulla is 10 mm today compared to 10 mm previously. As noted previously, there is abrupt tapering of the pancreatic duct in the head of the pancreas. No evidence for choledocholithiasis. Pancreas: Pancreatic head is prominent although the marked calcification seen on CT is not evident on MRI. 13  mm simple appearing cyst noted in the anterior pancreatic head, new in the interval since previous MRI. A second cyst in the tail of the pancreas measures 3.5 cm, showing thin smooth rim enhancement and no evidence for internal septation although there is some debris within the collection. Cyst is relatively homogeneous and low signal intensity on T1 with high signal intensity on T2 imaging. No main duct dilatation in the pancreas. Spleen: No splenomegaly. No focal mass lesion. Small collection of fluid posterior to the spleen may be loculated. There is a 4.5 x 2.0 cm rim enhancing collection lateral to the spleen that extends up into the lateral aspect of the subdiaphragmatic space. Adrenals/Urinary Tract: No adrenal nodule or mass. Simple cyst noted upper pole right kidney. No followup imaging is recommended. No suspicious abnormality in the left kidney. No hydronephrosis. Stomach/Bowel: Stomach is unremarkable. No gastric wall thickening. No evidence of outlet obstruction. Duodenum is normally positioned as is the ligament of Treitz. Thickening in jejunal small bowel loops of the left upper quadrant is likely secondary. Vascular/Lymphatic: No abdominal aortic aneurysm. Small hepatoduodenal ligament and retroperitoneal lymph nodes evident. Other: Small volume ascites noted with mesenteric edema. 5.4 x 1.8 cm loculated rim enhancing collections identified lateral to the splenic flexure of the colon. Musculoskeletal: No focal suspicious marrow enhancement within the visualized bony anatomy. IMPRESSION: 1. Stable intra and extrahepatic biliary duct dilatation with abrupt tapering of the pancreatic duct in the head of the pancreas. No evidence for choledocholithiasis. No discernible obstructing mass lesion. 2. The marked pancreatic parenchymal calcification seen on CT is not evident on MRI. No main duct dilatation in the pancreas. 3. 3.5 cm cyst in the tail of the pancreas showing thin smooth rim enhancement and some  debris within the collection. A second 13 mm cyst identified in the head of the pancreas. These are likely pseudocysts although superinfection cannot be excluded. 4. 4.5 x 2.0 cm rim enhancing fluid collection identified lateral to the spleen with probable loculated fluid posterior to the spleen as well. An additional 5.4 x 1.8 cm loculated rim enhancing collection identified lateral to the splenic flexure of the colon. These may represent pseudocysts although abscess is a possibility. 5. Distended gallbladder with layering sludge. No gallbladder wall thickening or evidence of gallstones. 6. Small volume ascites with mesenteric edema. 7. Left base atelectasis with left pleural effusion. 8. Thickening in jejunal small bowel loops of the left upper quadrant is likely secondary. Electronically Signed   By: ERandall Hiss  Tery Sanfilippo M.D.   On: 10/20/2022 08:40   US Abdomen Limited RUQ (LIVER/GB)  Result Date: 10/19/2022 CLINICAL DATA:  Epigastric pain EXAM: ULTRASOUND ABDOMEN LIMITED RIGHT UPPER QUADRANT COMPARISON:  CT angiogram chest abdomen and pelvis 10/19/2022 FINDINGS: Gallbladder: No gallstones or wall thickening visualized. Gallbladder is dilated. Gallbladder sludge is present. No sonographic Murphy sign noted by sonographer. Common bile duct: Diameter: 10 mm. There is also mild-to-moderate intrahepatic biliary ductal dilatation. Liver: Two hepatic cysts are identified. Right lobe cyst measures 11 x 10 x 11 mm. Left lobe cyst measures 11 x 7 x 8 mm. Within normal limits in parenchymal echogenicity. Portal vein is patent on color Doppler imaging with normal direction of blood flow towards the liver. Other: Incidental right renal simple cyst measuring 2.2 x 1.7 x 3.4 cm. IMPRESSION: 1. Gallbladder hydrops with gallbladder sludge. No gallstones or wall thickening. 2. Dilated common bile duct with mild-to-moderate intrahepatic biliary ductal dilatation. Recommend further evaluation with MRCP. Electronically Signed   By: Ronney Asters M.D.   On: 10/19/2022 21:06   CT Angio Chest/Abd/Pel for Dissection W and/or W/WO  Result Date: 10/19/2022 CLINICAL DATA:  Concern for aerated aneurysm. Left-sided chest pain. EXAM: CT ANGIOGRAPHY CHEST, ABDOMEN AND PELVIS TECHNIQUE: Non-contrast CT of the chest was initially obtained. Multidetector CT imaging through the chest, abdomen and pelvis was performed using the standard protocol during bolus administration of intravenous contrast. Multiplanar reconstructed images and MIPs were obtained and reviewed to evaluate the vascular anatomy. RADIATION DOSE REDUCTION: This exam was performed according to the departmental dose-optimization program which includes automated exposure control, adjustment of the mA and/or kV according to patient size and/or use of iterative reconstruction technique. CONTRAST:  193m OMNIPAQUE IOHEXOL 350 MG/ML SOLN COMPARISON:  CT abdomen pelvis dated 01/04/2022. FINDINGS: Evaluation is limited due to streak artifact caused by patient's arms. Evaluation is also limited due to anasarca and paucity of intra-abdominal fat. CTA CHEST FINDINGS Cardiovascular: There is no cardiomegaly or pericardial effusion. There is mild atherosclerotic calcification of the thoracic aorta. No aneurysmal dilatation or dissection. The aorta is tortuous. The origins of the great vessels of the aortic arch appear patent. No pulmonary artery embolus identified. Mediastinum/Nodes: No hilar or mediastinal adenopathy. The esophagus is grossly unremarkable. No mediastinal fluid collection. Lungs/Pleura: Small left pleural effusion with partial compressive atelectasis of the left versus pneumonia. There is background of centrilobular emphysema. There is no pneumothorax. The central airways are patent. Musculoskeletal: Degenerative changes of the spine. No acute osseous pathology. Review of the MIP images confirms the above findings. CTA ABDOMEN AND PELVIS FINDINGS VASCULAR Aorta: Mild atherosclerotic  calcification of the abdominal aorta. No aneurysmal dilatation or dissection. Celiac: Patent without evidence of aneurysm, dissection, vasculitis or significant stenosis. SMA: Patent without evidence of aneurysm, dissection, vasculitis or significant stenosis. Renals: Both renal arteries are patent without evidence of aneurysm, dissection, vasculitis, fibromuscular dysplasia or significant stenosis. IMA: Patent without evidence of aneurysm, dissection, vasculitis or significant stenosis. Inflow: Mild atherosclerotic calcification of the iliac arteries. The iliac arteries are patent. No aneurysmal dilatation or dissection. Veins: No obvious venous abnormality within the limitations of this arterial phase study. Review of the MIP images confirms the above findings. NON-VASCULAR No intra-abdominal free air.  Small free fluid within the pelvis. Hepatobiliary: Several small liver cysts, too small to characterize. There is mild dilatation of the biliary tree. No calcified stone within the gallbladder. Pancreas: There is coarse calcification of the head and uncinate process of the pancreas sequela of chronic pancreatitis.  Correlation with pancreatic enzymes recommended to evaluate for for possibility of acute on chronic pancreatitis. There is a 3.3 x 2.6 cm cyst in the tail of the pancreas most consistent with a pseudocyst. Attention on follow-up imaging recommended. Spleen: The spleen is unremarkable. There is perisplenic free fluid which appears somewhat loculated, likely sequela of pancreatitis. This fluid may represent a pseudocyst. Adrenals/Urinary Tract: The adrenal glands are poorly visualized. There is a small right renal upper pole cyst. There is no hydronephrosis on either side. The urinary bladder is grossly unremarkable. Stomach/Bowel: There is moderate stool throughout the colon. There is no bowel obstruction. Lymphatic: No adenopathy. Reproductive: The prostate gland is mildly enlarged measuring 4.5 cm in  transverse axial diameter. The seminal vesicles are symmetric. Other: Diffuse mesenteric and subcutaneous edema and anasarca. Musculoskeletal: Degenerative changes of the spine. No acute osseous pathology. Review of the MIP images confirms the above findings. IMPRESSION: 1. No aortic aneurysm or dissection. 2. Small left pleural effusion with partial compressive atelectasis of the left versus pneumonia. 3. Sequela of chronic pancreatitis including pancreatic head calcification pancreatic tail pseudocyst. Correlation with pancreatic enzymes recommended to evaluate for possibility of acute on chronic pancreatitis. 4. Perisplenic fluid collection most consistent with post inflammatory collection or developing pseudocysts. An abscess sign report cyst is not excluded. 5. No bowel obstruction. 6. Aortic Atherosclerosis (ICD10-I70.0) and Emphysema (ICD10-J43.9). Electronically Signed   By: Anner Crete M.D.   On: 10/19/2022 17:41   DG Chest 2 View  Result Date: 10/19/2022 CLINICAL DATA:  Chest pain EXAM: CHEST - 2 VIEW COMPARISON:  01/04/2022 FINDINGS: Asymmetric elevation left hemidiaphragm. Trace atelectasis noted left base with possible tiny left pleural effusion. Right lung clear. The cardiopericardial silhouette is within normal limits for size. The visualized bony structures of the thorax are unremarkable. IMPRESSION: Asymmetric elevation left hemidiaphragm with trace left base atelectasis and possible tiny left pleural effusion. Electronically Signed   By: Misty Stanley M.D.   On: 10/19/2022 13:41    Medications / Allergies: per chart  Antibiotics: Anti-infectives (From admission, onward)    None         Note: Portions of this report may have been transcribed using voice recognition software. Every effort was made to ensure accuracy; however, inadvertent computerized transcription errors may be present.   Any transcriptional errors that result from this process are unintentional.    Jared Hector, MD, FACS, MASCRS Esophageal, Gastrointestinal & Colorectal Surgery Robotic and Minimally Invasive Surgery  [facility]. [address], [place],  [address] [phone] Fax [phone] Main  CONTACT INFORMATION:  Weekday (9AM-5PM): Call [facility] main office at [phone]  Weeknight (5PM-9AM) or Weekend/Holiday: Check [email or web address] (password " [email or web address]") for General Surgery [facility] coverage  (Please, do not use SecureChat as it is not reliable communication to reach operating surgeons for immediate patient care given surgeries/outpatient duties/clinic/cross-coverage/off post-call which would lead to a delay in care.  Epic staff messaging available for outptient concerns, but may not be answered for 48 hours or more).      11/09/2022  5:05 PM

## 2022-11-09 NOTE — Assessment & Plan Note (Addendum)
Patient with sepsis syndrome (present on admission), possible infected intra abdominal hematoma.   Plan to start patient with broad spectrum antibiotic therapy with Zosyn and vancomycin IV IV fluids with balance electrolyte solutions. Telemetry monitoring. Surgery has been consulted.  Follow up cultures and temperature curve.

## 2022-11-09 NOTE — Assessment & Plan Note (Signed)
Renal function is stable.  Continue fluid resuscitation with balanced electrolyte solutions Follow up renal function in am, avoid hypotension and nephrotoxic medications.

## 2022-11-09 NOTE — Assessment & Plan Note (Signed)
No clinical signs of encephalopathy or decompensated liver disease. Continue supportive medical therapy.

## 2022-11-09 NOTE — H&P (Signed)
History and Physical    Patient: Unkown Defronzo [id number] DOB: April 16, 1951 DOA: 11/09/2022 DOS: the patient was seen and examined on 11/09/2022 PCP: [name], MD  Patient coming from: Home  Chief Complaint:  Chief Complaint  Patient presents with   Abdominal Pain   HPI: Jared Duran is a 72 y.o. 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 Patient was recently hospitalized from 02/22 to 11/06/22 for spontaneous splenic hemorrhage, complicated with leukocytosis and left sided pleural effusion. He underwent splenic artery arteriography and embolization. 2 units PRBC transfusion and left sided thoracentesis (420 cc removed).   This is his third hospitalization over the last month, the first one on 10/19/22 for acute pancreatitis.   Patient arrived at home 3 days ago, he was very weak and deconditioned, to the point he  sustained a fall while getting into his home. He has been experiencing worsening abdominal pain, severe in intensity with no improving factors, worse with movement and touch, associated with abdominal distention, nausea, vomiting and diarrhea. Positive night sweats and fevers, very poor oral intake, only able to take sips of ensure. Patient feeling very weak and deconditioned.  His daughter went to his home today and called EMS. He was found with significant abdominal distention and hypoxemic with 02 saturation 86% on room air, he was placed on 6 L/min per [place] and was transported to the ED.     Review of Systems: As mentioned in the history of present illness. All other systems reviewed and are negative. 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 ([place]) [id number]   History of Helicobacter pylori infection 2021 10/30/2022   Hypertension    IDA (iron deficiency anemia) 10/30/2022   Pancreatitis    Past Surgical History:  Procedure  Laterality Date   ESOPHAGOGASTRODUODENOSCOPY (EGD) WITH PROPOFOL N/A 04/10/2021   Procedure: ESOPHAGOGASTRODUODENOSCOPY (EGD) WITH PROPOFOL;  Surgeon: [name], MD;  Location: [facility];  Service: Endoscopy;  Laterality: N/A;   EUS N/A 04/10/2021   Procedure: UPPER ENDOSCOPIC ULTRASOUND (EUS) LINEAR;  Surgeon: [name], MD;  Location: [facility];  Service: Endoscopy;  Laterality: N/A;   IR ANGIOGRAM SELECTIVE EACH ADDITIONAL VESSEL  10/31/2022   IR ANGIOGRAM VISCERAL SELECTIVE  10/31/2022   IR EMBO ART  VEN HEMORR LYMPH EXTRAV  INC GUIDE ROADMAPPING  10/31/2022   IR US GUIDE VASC ACCESS RIGHT  10/31/2022   Social History:  reports that he has been smoking cigarettes. He has been smoking an average of 1 pack per day. He has never used smokeless tobacco. He reports that he does not currently use alcohol. He reports that he does not use drugs.  Allergies  Allergen Reactions   Ethanol Other (See Comments)    Severe pancreatitis & liver disease = NO ALCOHOL    No family history on file.  Prior to Admission medications   Medication Sig Start Date End Date Taking? Authorizing Provider  feeding supplement (ENSURE ENLIVE / ENSURE PLUS) LIQD Take 237 mLs by mouth 2 (two) times daily between meals. 11/06/22 12/06/22 Yes [name], MD  hydrOXYzine (ATARAX) 25 MG tablet Take 25 mg by mouth every 8 (eight) hours as needed for anxiety or itching. 10/10/22  Yes [provider]  losartan (COZAAR) 100 MG tablet Take 100 mg by mouth daily.   Yes [provider]  NIFEdipine (ADALAT CC) 90 MG 24 hr tablet Take 90 mg by mouth  daily. 10/27/22  Yes [provider]  pantoprazole (PROTONIX) 20 MG tablet Take 20 mg by mouth daily. 08/11/22  Yes [provider]  polycarbophil (FIBERCON) 625 MG tablet Take 1 tablet (625 mg total) by mouth 2 (two) times daily. 11/06/22 12/06/22 Yes Ghimire, Dante Gang, MD  polyethylene glycol (MIRALAX / GLYCOLAX) 17 g packet Take 17 g by mouth  daily as needed for moderate constipation or mild constipation. 11/06/22  Yes [name], MD  sertraline (ZOLOFT) 25 MG tablet Take 25 mg by mouth daily.   Yes [provider]  oxyCODONE (ROXICODONE) 5 MG immediate release tablet Take 1 tablet (5 mg total) by mouth every 6 (six) hours as needed for up to 5 days for moderate pain or severe pain. Patient not taking: Reported on 11/09/2022 11/06/22 11/11/22  [name], MD    Physical Exam: Vitals:   11/09/22 1600 11/09/22 1615 11/09/22 1630 11/09/22 1645  BP: 112/71 111/69 109/70 117/89  Pulse:   (!) 113 (!) 117  Resp: (!) 26 (!) 27 (!) 25 (!) 23  Temp:      TempSrc:      SpO2:   95% 92%   Neurology awake and alert, very weak and deconditioned. ENT with pallor but not icterus Cardiovascular with S1 and S2 present and tachycardic with no gallops, rubs or murmurs Positive JVD Trace lower extremity edema bilaterally Respiratory with no rales or wheezing, no rhonchi. Abdomen is distended with tympanic to percussion, tender to superficial palpation and rebound tenderness, decreases bowel sounds.   Data Reviewed:   Na 134, K 4,3 Cl 102, bicarbonate 24, glucose 136, bun 35 cr 1,25  Lipase 164 AST 36 ALT 17, Alk P 1125, total bil 2.2  Wbc 28,8 hgb 9,3 plt 533   Chest radiograph with bilateral atelectasis, positive scoliosis.   CT sequale of previous ruptures spleen identified. Mixed attenuation perisplenic fluid collection with subacute to chronic hematoma. No signs of active hemorrhage.  Large low to intermediate fluid collection within the upper abdomen with communicates with the perisplenic hematoma. 29.2 x 10.8 x 11/3 cm, collection. Favoring large loculated chronic hematoma vs large pseudocyst with communicated with the perisplenic hematoma.  Small bowel dilatation with transition to decreased caliber distal small bowel loops, concerning for small  bowel obstruction.  Liver cirrhosis.  Chronic pancreatitis.    EKG 114  bpm, left axis deviation, normal intervals, sinus rhythm with no significant ST segment or T wave changes.   72 yo male with past medical history of alcohol abuse with chronic liver cirrhosis and chronic pancreatitis. 3rd hospitalization over the last month, last hospitalization for splenic rupture.  At home his symptoms continue to worsen, and returning to the hospital 3 days later. He is very weak and deconditioned, positive tachycardia, tachypnea and hypoxemia. Abdomen very distended and tender. Positive leukocytosis.   Assessment and Plan: * Sepsis ([place]) Patient with sepsis syndrome (present on admission), possible infected intra abdominal hematoma.   Plan to start patient with broad spectrum antibiotic therapy with Zosyn and vancomycin IV IV fluids with balance electrolyte solutions. Telemetry monitoring. Surgery has been consulted.  Follow up cultures and temperature curve.     Ileus in the setting of pancreatitis with pseudocysts CT with sings of small bowel obstruction.  Surgery has been consulted and ED plans to insert NG tube for decompression. Continue medical supportive care with IV fluids and as needed IV analgesics (hydromorphone) and antiemetics (zofran). Continue with antiacid therapy.   Chronic pancreatitis due to chronic  alcoholism ([place]) Acute on chronic pancreatitis Continue NPO and supportive medical care.   Nontraumatic splenic rupture s/p IR artery embolization 10/31/2022 Positive intra abdominal hematoma.  Hgb is 9,3 on admission. No clinical signs of active bleeding.   CKD (chronic kidney disease) stage 2, GFR 60-89 ml/min Renal function is stable.  Continue fluid resuscitation with balanced electrolyte solutions Follow up renal function in am, avoid hypotension and nephrotoxic medications.   Protein-calorie malnutrition, severe Patient very deconditioned. Hold on nutrition per po for now due to small bowel obstruction and pancreatitis.   Cirrhosis,  alcoholic ([place]) No clinical signs of encephalopathy or decompensated liver disease. Continue supportive medical therapy.   Alcohol abuse, in remission No signs of alcohol withdrawal.       Advance Care Planning:   Code Status: Full Code   Consults: general surgery   Family Communication: no family at the bedside   Severity of Illness: The appropriate patient status for this patient is INPATIENT. Inpatient status is judged to be reasonable and necessary in order to provide the required intensity of service to ensure the patient's safety. The patient's presenting symptoms, physical exam findings, and initial radiographic and laboratory data in the context of their chronic comorbidities is felt to place them at high risk for further clinical deterioration. Furthermore, it is not anticipated that the patient will be medically stable for discharge from the hospital within 2 midnights of admission.   * I certify that at the point of admission it is my clinical judgment that the patient will require inpatient hospital care spanning beyond 2 midnights from the point of admission due to high intensity of service, high risk for further deterioration and high frequency of surveillance required.*  Author: Tawni Millers, MD 11/09/2022 5:16 PM  For on call review [email or web address].

## 2022-11-09 NOTE — Assessment & Plan Note (Signed)
No signs of alcohol withdrawal.

## 2022-11-09 NOTE — Assessment & Plan Note (Signed)
CT with sings of small bowel obstruction.  Surgery has been consulted and ED plans to insert NG tube for decompression. Continue medical supportive care with IV fluids and as needed IV analgesics (hydromorphone) and antiemetics (zofran). Continue with antiacid therapy.

## 2022-11-09 NOTE — Progress Notes (Signed)
Patient has developed atrial fibrillation with RVR, personally reviewed telemetry monitoring.  Blood pressure systolic 89 mmHg. Patient now has a NG tube in place.   Plan to start patient on IV amiodarone Admit to step down unit Patient with poor prognosis, his daughter is at the bedside. All questions were addressed, changing code status to DNR.

## 2022-11-09 NOTE — Assessment & Plan Note (Signed)
Positive intra abdominal hematoma.  Hgb is 9,3 on admission. No clinical signs of active bleeding.

## 2022-11-09 NOTE — Assessment & Plan Note (Signed)
Acute on chronic pancreatitis Continue NPO and supportive medical care.

## 2022-11-09 NOTE — ED Triage Notes (Signed)
Pt BIB [facility] and coming from home.  He was recently seen for abd pain and spleen rupture. Patient was recently discharged. Patient had a fall at home on Thursday and hit is abdominal area. He noticed swelling last night. EMS reports his abd is distended and hard to palpation.   EMS 89% RA but was put on NL at 6L with his O2 being 98%. EMS noted to be clammy. BP was 126/70, HR 115. Patient endorses vomiting.   On arrival to the ED, pt's O2 is 94% on RA.

## 2022-11-10 ENCOUNTER — Encounter (HOSPITAL_COMMUNITY): Payer: Self-pay | Admitting: Internal Medicine

## 2022-11-10 ENCOUNTER — Inpatient Hospital Stay: Payer: Self-pay

## 2022-11-10 DIAGNOSIS — F1020 Alcohol dependence, uncomplicated: Secondary | ICD-10-CM

## 2022-11-10 DIAGNOSIS — K567 Ileus, unspecified: Secondary | ICD-10-CM | POA: Diagnosis not present

## 2022-11-10 DIAGNOSIS — A419 Sepsis, unspecified organism: Secondary | ICD-10-CM | POA: Diagnosis not present

## 2022-11-10 DIAGNOSIS — K7031 Alcoholic cirrhosis of liver with ascites: Secondary | ICD-10-CM | POA: Diagnosis not present

## 2022-11-10 DIAGNOSIS — K860 Alcohol-induced chronic pancreatitis: Secondary | ICD-10-CM | POA: Diagnosis not present

## 2022-11-10 DIAGNOSIS — R6520 Severe sepsis without septic shock: Secondary | ICD-10-CM

## 2022-11-10 LAB — COMPREHENSIVE METABOLIC PANEL
ALT: 15 U/L (ref 0–44)
AST: 44 U/L — ABNORMAL HIGH (ref 15–41)
Albumin: 1.6 g/dL — ABNORMAL LOW (ref 3.5–5.0)
Alkaline Phosphatase: 113 U/L (ref 38–126)
Anion gap: 8 (ref 5–15)
BUN: 38 mg/dL — ABNORMAL HIGH (ref 8–23)
CO2: 21 mmol/L — ABNORMAL LOW (ref 22–32)
Calcium: 7.4 mg/dL — ABNORMAL LOW (ref 8.9–10.3)
Chloride: 103 mmol/L (ref 98–111)
Creatinine, Ser: 1.32 mg/dL — ABNORMAL HIGH (ref 0.61–1.24)
GFR, Estimated: 58 mL/min — ABNORMAL LOW (ref >60)
Glucose, Bld: 160 mg/dL — ABNORMAL HIGH (ref 70–99)
Potassium: 6.2 mmol/L — ABNORMAL HIGH (ref 3.5–5.1)
Sodium: 132 mmol/L — ABNORMAL LOW (ref 135–145)
Total Bilirubin: 1.9 mg/dL — ABNORMAL HIGH (ref 0.3–1.2)
Total Protein: 6 g/dL — ABNORMAL LOW (ref 6.5–8.1)

## 2022-11-10 LAB — BASIC METABOLIC PANEL
Anion gap: 11 (ref 5–15)
BUN: 48 mg/dL — ABNORMAL HIGH (ref 8–23)
CO2: 18 mmol/L — ABNORMAL LOW (ref 22–32)
Calcium: 7.3 mg/dL — ABNORMAL LOW (ref 8.9–10.3)
Chloride: 104 mmol/L (ref 98–111)
Creatinine, Ser: 2 mg/dL — ABNORMAL HIGH (ref 0.61–1.24)
GFR, Estimated: 35 mL/min — ABNORMAL LOW (ref >60)
Glucose, Bld: 157 mg/dL — ABNORMAL HIGH (ref 70–99)
Potassium: 5.1 mmol/L (ref 3.5–5.1)
Sodium: 133 mmol/L — ABNORMAL LOW (ref 135–145)

## 2022-11-10 LAB — PREALBUMIN: Prealbumin: <5 mg/dL — ABNORMAL LOW (ref 18–38)

## 2022-11-10 LAB — CBC
HCT: 35 % — ABNORMAL LOW (ref 39.0–52.0)
Hemoglobin: 11.2 g/dL — ABNORMAL LOW (ref 13.0–17.0)
MCH: 30.8 pg (ref 26.0–34.0)
MCHC: 32 g/dL (ref 30.0–36.0)
MCV: 96.2 fL (ref 80.0–100.0)
Platelets: 697 10*3/uL — ABNORMAL HIGH (ref 150–400)
RBC: 3.64 MIL/uL — ABNORMAL LOW (ref 4.22–5.81)
RDW: 15.8 % — ABNORMAL HIGH (ref 11.5–15.5)
WBC: 20.2 10*3/uL — ABNORMAL HIGH (ref 4.0–10.5)
nRBC: 0.5 % — ABNORMAL HIGH (ref 0.0–0.2)

## 2022-11-10 LAB — GLUCOSE, CAPILLARY
Glucose-Capillary: 142 mg/dL — ABNORMAL HIGH (ref 70–99)
Glucose-Capillary: 133 mg/dL — ABNORMAL HIGH (ref 70–99)

## 2022-11-10 LAB — MAGNESIUM: Magnesium: 2.3 mg/dL (ref 1.7–2.4)

## 2022-11-10 LAB — LIPASE, BLOOD: Lipase: 303 U/L — ABNORMAL HIGH (ref 11–51)

## 2022-11-10 LAB — PHOSPHORUS: Phosphorus: 6.1 mg/dL — ABNORMAL HIGH (ref 2.5–4.6)

## 2022-11-10 MED ORDER — DIPHENHYDRAMINE HCL 12.5 MG/5ML PO ELIX: 12.5000 mg | ORAL_SOLUTION | Freq: Four times a day (QID) | ORAL | Status: DC | PRN

## 2022-11-10 MED ORDER — TRAVASOL 10 % IV SOLN
INTRAVENOUS | Status: DC
  Filled 2022-11-10: qty 396

## 2022-11-10 MED ORDER — HYDROMORPHONE HCL 1 MG/ML IJ SOLN
1.0000 mg | INTRAMUSCULAR | Status: DC | PRN
  Administered 2022-11-10 – 2022-11-11 (×7): 1 mg via INTRAVENOUS
  Filled 2022-11-10 (×7): qty 1

## 2022-11-10 MED ORDER — DIPHENHYDRAMINE HCL 50 MG/ML IJ SOLN: 12.5000 mg | Freq: Four times a day (QID) | INTRAMUSCULAR | Status: DC | PRN

## 2022-11-10 MED ORDER — TRAVASOL 10 % IV SOLN
INTRAVENOUS | Status: DC
  Filled 2022-11-10: qty 528

## 2022-11-10 MED ORDER — LACTATED RINGERS IV BOLUS
1000.0000 mL | Freq: Once | INTRAVENOUS | Status: AC
  Administered 2022-11-10: 1000 mL via INTRAVENOUS

## 2022-11-10 MED ORDER — SODIUM CHLORIDE 0.9% FLUSH: 9.0000 mL | INTRAVENOUS | Status: DC | PRN

## 2022-11-10 MED ORDER — HYDROMORPHONE 1 MG/ML IV SOLN: INTRAVENOUS | Status: DC

## 2022-11-10 MED ORDER — ONDANSETRON HCL 4 MG/2ML IJ SOLN: 4.0000 mg | Freq: Four times a day (QID) | INTRAMUSCULAR | Status: DC | PRN

## 2022-11-10 MED ORDER — LORAZEPAM 2 MG/ML IJ SOLN: 1.0000 mg | Freq: Four times a day (QID) | INTRAMUSCULAR | Status: DC | PRN

## 2022-11-10 MED ORDER — INSULIN ASPART 100 UNIT/ML IJ SOLN
0.0000 [IU] | Freq: Four times a day (QID) | INTRAMUSCULAR | Status: DC
  Administered 2022-11-10: 1 [IU] via SUBCUTANEOUS

## 2022-11-10 MED ORDER — LACTATED RINGERS IV SOLN: INTRAVENOUS | Status: DC

## 2022-11-10 MED ORDER — FAMOTIDINE IN NACL 20-0.9 MG/50ML-% IV SOLN: 20.0000 mg | INTRAVENOUS | Status: DC

## 2022-11-10 MED ORDER — NALOXONE HCL 0.4 MG/ML IJ SOLN: 0.4000 mg | INTRAMUSCULAR | Status: DC | PRN

## 2022-11-10 NOTE — Progress Notes (Signed)
I reexamined patient to evaluate his status.  Patient's daughter, sister and patient's niece at the bedside.  Patient was sleepy after second dose of Dilaudid.  Looks comfortable, however did not wake up for conversation.  His renal functions further worsening.  Blood pressure and heart rate is stable today.   We clarified goal of care, all family is in agreement to hold off on further invasive procedure today including PICC line and TPN.  Would like to monitor how he does overnight.  If any improvement then can proceed with PICC line and TPN.  Goals are clear, if patient to decompensate further should treat with comfort care and hospice approach.  Discontinue PCA orders, will maintain with IV Dilaudid. Discontinue PICC line orders and TPN orders today.

## 2022-11-10 NOTE — Progress Notes (Signed)
PROGRESS NOTE    Jared Duran  [id number] DOB: 12/05/1950 DOA: 11/09/2022 PCP: [name], MD    Brief Narrative:  72 year old gentleman with history of chronic recurrent pancreatitis, requiring multiple hospitalization Recent admission 2/22-2/29 for a spontaneous splenic hemorrhage complicated by leukocytosis and left-sided pleural effusion status post splenic artery arteriogram and embolization, blood transfusions and left-sided thoracentesis Comes back 3/3 with worsening abdominal pain, abdominal distention, nausea vomiting and diarrhea, night sweats and fevers.  Very weak and deconditioned.  Found to have significant abdomen distention, hypoxemia with oxygen saturation 86% on room air, he was placed on 6 L of oxygen and transported to the ER. In the emergency room blood pressures 89/51, WBC count 22.8, hemoglobin 8.9, CT scan with large perisplenic hematoma 30 cm in size.  NG tube was placed.  Surgery was consulted.  Admitted with surgical consultation.  Fluid resuscitation and antibiotics started.  Patient also developed rapid A-fib and started on amiodarone.  Assessment & Plan:   Sepsis versus hypovolemic shock secondary to intra-abdominal hematoma: Ileus in the setting of pancreatitis and pseudocyst  Detailed surgical consultation, likely loculated hematoma along with multiple pancreatic pseudocyst.  Very high risk of surgical drainage.  Conservative management advised. Continue maintenance IV fluids.  Blood pressures are adequate now. NG tube decompression, adequate pain medications, IV fluids, n.p.o., continue broad-spectrum antibiotics today pending further clinical improvement.  He is not able to have surgical drainage because of very high risk of wound dehiscence. Pain is not adequately managed.  Will start patient on PCA pump.  Nontraumatic splenic rupture status post artery embolization: Large intra-abdominal hematoma.  Hemoglobin stable.  No clinical signs of active  bleeding.  Unable to drain as per surgical consultation.  Severe protein calorie malnutrition: Very deconditioned.  Start TPN.  AKI on CKD stage II Hyperkalemia: Continue IV fluids.  Urine output is adequate.  New onset A-fib with RVR: Currently on amiodarone drip.  Not an anticoagulation candidate with hematoma.  Goal of care: Patient known to this provider from previous admission.  Patient greeted me and told me that the system is to take.  He tells me that he cannot go through all this if this is not getting better.  Daughter present at the bedside. Both patient and his daughter are in clear agreement that patient is in very poor health and critical condition.  They are aware that he does not have any surgical way to fix this issues and this may get worse causing him more pain and suffering, more bleeding. We discussed about more aggressive pain control with PCA pump. We discussed about nutritional supplements with TPN, ordered PICC line. Goal of care is to be able to eat and have pain control with all these interventions. Patient and daughter are clear that if patient has to have more decompensation, become more unstable, treat with comfort care and hospice approach. Confirmed DNR/DNI.  No CPR, no intubation.  Okay for vasopressors and all noninvasive support.  Okay for PICC line.   DVT prophylaxis: SCDs Start: 11/09/22 2059   Code Status: DNR/DNI Family Communication: Daughter at the bedside. Disposition Plan: Status is: Inpatient Remains inpatient appropriate because: Severe symptomatic disease     Consultants:  General surgery  Procedures:  None  Antimicrobials:  Vancomycin and Zosyn 3/3---   Subjective: Patient seen and examined.  Known to this provider from previous admission.  Looks very sick.  He tells me that he has too much pain and cannot take it.  Belly is so distended.  Denies any nausea.  NG tube is empty, he wants to try some water.  Has not passed any flatus  or bowel movement since yesterday morning.  Blood pressures are low but adequate. Telemetry monitor with A-fib with heart rate about 90-110s.  Objective: Vitals:   11/10/22 0600 11/10/22 0700 11/10/22 0750 11/10/22 0825  BP: (!) 91/52 (!) 86/67    Pulse:  85    Resp: (!) 36 (!) 30    Temp:   (!) 97.1 F (36.2 C)   TempSrc:   Axillary   SpO2:  100%    Weight:    58.7 kg  Height:    '5\' 7"'$  (1.702 m)    Intake/Output Summary (Last 24 hours) at 11/10/2022 1026 Last data filed at 11/10/2022 0749 Gross per 24 hour  Intake 1829.02 ml  Output 0 ml  Net 1829.02 ml   Filed Weights   11/10/22 0825  Weight: 58.7 kg    Examination:  General exam: Sick looking.  In moderate distress due to pain.  Able to communicate but unable to finish sentences. Respiratory system: Clear to auscultation.  Poor inspiratory effort. Cardiovascular system: S1 & S2 heard, irregularly labile.  Tachycardic.  Gastrointestinal system: Tight, distended, severe pain on light touch.  Bowel sounds absent. Central nervous system: Alert and oriented.  Moves all extremities.  Extremities: Symmetric 5 x 5 power.    Data Reviewed: I have personally reviewed following labs and imaging studies  CBC: Recent Labs  Lab 11/05/22 0557 11/06/22 0648 11/09/22 1440 11/09/22 1510 11/09/22 2120 11/10/22 0012  WBC 24.1* 24.8* 28.8*  --  22.8* 20.2*  NEUTROABS  --  20.8* 24.5*  --   --   --   HGB 8.3* 8.8* 9.3* 10.5* 8.9* 11.2*  HCT 25.5* 27.7* 28.1* 31.0* 27.6* 35.0*  MCV 97.3 97.9 94.9  --  95.8 96.2  PLT 285 353 533*  --  582* [id number]*   Basic Metabolic Panel: Recent Labs  Lab 11/04/22 0622 11/05/22 0557 11/09/22 1440 11/09/22 1510 11/09/22 2120 11/10/22 0012  NA 132* 133* 134* 135  --  132*  K 3.6 3.9 4.3 4.4  --  6.2*  CL 103 104 102 101  --  103  CO2 '22 24 24  '$ --   --  21*  GLUCOSE 118* 113* 136* 132*  --  160*  BUN 14 16 35* 34*  --  38*  CREATININE 0.96 0.97 1.25* 1.20 1.29* 1.32*  CALCIUM 7.9* 7.9* 7.8*   --   --  7.4*  MG  --   --   --  2.6*  --   --   PHOS  --   --   --  4.9*  --   --    GFR: Estimated Creatinine Clearance: 43.9 mL/min (A) (by C-G formula based on SCr of 1.32 mg/dL (H)). Liver Function Tests: Recent Labs  Lab 11/04/22 0622 11/09/22 1440 11/10/22 0012  AST 18 36 44*  ALT '14 17 15  '$ ALKPHOS 86 125 113  BILITOT 2.4* 2.2* 1.9*  PROT 5.9* 7.1 6.0*  ALBUMIN 2.0* 1.9* 1.6*   Recent Labs  Lab 11/04/22 0622 11/09/22 1440 11/10/22 0012  LIPASE 168* 164* 303*   No results for input(s): "AMMONIA" in the last 168 hours. Coagulation Profile: Recent Labs  Lab 11/09/22 1440  INR 1.1   Cardiac Enzymes: No results for input(s): "CKTOTAL", "CKMB", "CKMBINDEX", "TROPONINI" in the last 168 hours. BNP (last 3 results) No results for input(s): "PROBNP" in the  last 8760 hours. HbA1C: No results for input(s): "HGBA1C" in the last 72 hours. CBG: Recent Labs  Lab 11/03/22 1327 11/03/22 1807 11/03/22 2341 11/04/22 0620 11/10/22 0148  GLUCAP 124* 174* 136* 132* 142*   Lipid Profile: No results for input(s): "CHOL", "HDL", "LDLCALC", "TRIG", "CHOLHDL", "LDLDIRECT" in the last 72 hours. Thyroid Function Tests: No results for input(s): "TSH", "T4TOTAL", "FREET4", "T3FREE", "THYROIDAB" in the last 72 hours. Anemia Panel: No results for input(s): "VITAMINB12", "FOLATE", "FERRITIN", "TIBC", "IRON", "RETICCTPCT" in the last 72 hours. Sepsis Labs: Recent Labs  Lab 11/04/22 0622  PROCALCITON 2.59    Recent Results (from the past 240 hour(s))  MRSA Next Gen by PCR, Nasal     Status: None   Collection Time: 10/31/22  1:00 PM   Specimen: Nasal Mucosa; Nasal Swab  Result Value Ref Range Status   MRSA by PCR Next Gen NOT DETECTED NOT DETECTED Final    Comment: (NOTE) The GeneXpert MRSA Assay (FDA approved for NASAL specimens only), is one component of a comprehensive MRSA colonization surveillance program. It is not intended to diagnose MRSA infection nor to guide or  monitor treatment for MRSA infections. Test performance is not FDA approved in patients less than 76 years old. Performed at [facility], [place] [address] [place], [place] [address]   Gram stain     Status: None   Collection Time: 11/03/22  2:30 PM   Specimen: PATH Cytology Pleural fluid  Result Value Ref Range Status   Specimen Description   Final    PLEURAL Performed at [facility] [address] [place], [place] [address]    Special Requests   Final    NONE Performed at [facility], [place] [address], [place], [place] [address]    Gram Stain   Final    CYTOSPIN SMEAR WBC PRESENT, PREDOMINANTLY PMN NO ORGANISMS SEEN Performed at [facility], [place] [address], [place], [place] [address]    Report Status 11/03/2022 FINAL  Final  Culture, blood (Routine X 2) w Reflex to ID Panel     Status: None   Collection Time: 11/04/22  9:34 AM   Specimen: BLOOD  Result Value Ref Range Status   Specimen Description   Final    BLOOD BLOOD RIGHT HAND Performed at [facility] [address] [place], [place] [address]    Special Requests   Final    BOTTLES DRAWN AEROBIC AND ANAEROBIC Blood Culture adequate volume Performed at [facility] [address], [place], [place] [address]    Culture   Final    NO GROWTH 5 DAYS Performed at [facility], [place] [address] [place], [place] [address]    Report Status 11/09/2022 FINAL  Final  Culture, blood (Routine X 2) w Reflex to ID Panel     Status: None   Collection Time: 11/04/22  9:37 AM   Specimen: BLOOD  Result Value Ref Range Status   Specimen Description   Final    BLOOD BLOOD LEFT HAND Performed at [facility] [address], [place], [place] [address]    Special Requests   Final    BOTTLES DRAWN AEROBIC AND ANAEROBIC Blood Culture adequate volume Performed at [facility]  [address], [place],  [address]    Culture   Final    NO GROWTH 5 DAYS Performed at [facility], [place] [address] [place], [place]  [id number]    Report Status 11/09/2022 FINAL  Final  MRSA Next Gen by PCR, Nasal     Status: None   Collection Time: 11/09/22  8:34 PM   Specimen: Nasal Mucosa; Nasal Swab  Result Value Ref Range Status   MRSA by PCR Next Gen NOT DETECTED NOT DETECTED Final    Comment: (NOTE) The GeneXpert MRSA Assay (FDA approved for NASAL specimens only), is one component of a comprehensive MRSA colonization surveillance program. It is not intended to diagnose MRSA infection nor to guide or monitor treatment for MRSA infections. Test performance is not FDA approved in patients less than 50 years old. Performed at [facility], [place] [address] [place], [place] [address]   Culture, blood (Routine X 2) w Reflex to ID Panel     Status: None (Preliminary result)   Collection Time: 11/09/22  9:20 PM   Specimen: BLOOD RIGHT HAND  Result Value Ref Range Status   Specimen Description   Final    BLOOD RIGHT HAND Performed at [facility], [place] [address] [place], [place] [address]    Special Requests   Final    BOTTLES DRAWN AEROBIC ONLY Blood Culture adequate volume Performed at [facility] [address] [place], [place] [address]    Culture   Final    NO GROWTH < 12 HOURS Performed at [facility] [address] [place], [place] [address]    Report Status PENDING  Incomplete  Culture, blood (Routine X 2) w Reflex to ID Panel     Status: None (Preliminary result)   Collection Time: 11/09/22  9:20 PM   Specimen: BLOOD LEFT HAND  Result Value Ref Range Status   Specimen Description   Final    BLOOD LEFT HAND Performed at [facility], [place] [address] [place], [place] [address]    Special Requests   Final    BOTTLES DRAWN AEROBIC ONLY Blood Culture adequate volume Performed at [facility] [address] [place], [place] [address]    Culture   Final    NO GROWTH < 12 HOURS Performed at [place] [address], [place],  [address]    Report Status PENDING  Incomplete         Radiology Studies: [place] EKG SITE RITE  Result Date: 11/10/2022 If Site Rite image not attached, placement could not be confirmed due to current cardiac rhythm.  DG Abd Portable 1 View  Result Date: 11/09/2022 CLINICAL DATA:  NG tube placement EXAM: PORTABLE ABDOMEN - 1 VIEW COMPARISON:  November 09, 2022, November 04, 2022 FINDINGS: Incomplete visualization of the inferior abdomen and pelvis. Enteric tube tip and side port project over the stomach. Small LEFT pleural effusion. Bibasilar atelectasis. Mildly prominent loops of bowel in the LEFT hemiabdomen. IMPRESSION: Enteric tube tip and side port project over the stomach. Electronically Signed   By: Valentino Saxon M.D.   On: 11/09/2022 17:54   CT LIVER ABDOMEN W WO CONTRAST  Result Date: 11/09/2022 CLINICAL DATA:  Recent splenic rupture with subsequent splenic artery embolization. EXAM: CT ABDOMEN WITHOUT AND WITH CONTRAST TECHNIQUE: Multidetector CT imaging of the abdomen was performed following the standard protocol before and following the bolus administration of intravenous contrast. RADIATION DOSE REDUCTION: This exam was performed according to the departmental dose-optimization program which includes automated exposure control, adjustment of the mA and/or kV according to patient size and/or use of iterative reconstruction  technique. CONTRAST:  160m OMNIPAQUE IOHEXOL 350 MG/ML SOLN COMPARISON:  10/30/2022 FINDINGS: Lower chest: There is a small left pleural effusion. Atelectasis involving the left lower lobe and right lower lobe identified. Hepatobiliary: Contour of liver is nodular. Small cyst within dome of liver. Several small subcentimeter low-density foci are identified which are unchanged and likely represent  small cysts. Gallbladder appears normal. Unchanged intrahepatic and common bile duct dilatation. CBD measures 9 mm up to the head of the pancreas where changes of chronic pancreatitis are again seen. Pancreas: There are changes of chronic pancreatitis with extensive pancreatic calcifications. There are several low density fluid collections associated with the head and distal tail of pancreas. Favor pseudo cysts. The largest arises off the tail of pancreas measuring 3.6 x 1.8 cm. Previously this measured 3.7 x 2.3 cm. Interval decrease in peripancreatic inflammation, free fluid in fat stranding. Spleen: Sequelae of splenic rupture identified. Mixed attenuation perisplenic fluid collection is again noted compatible with subacute to chronic hematoma. This measures 7.0 x 8.5 by 6 10.1 cm. There are no signs of current IV contrast extravasation to suggest active hemorrhage. Adrenals/Urinary Tract: Normal adrenal glands. No hydronephrosis, nephrolithiasis or suspicious mass. 2.7 cm Bosniak class 1 cyst arises off the upper pole of the right kidney. No follow-up imaging recommended. Urinary bladder is unremarkable. Stomach/Bowel: Stomach appears normal. There is abnormal proximal small bowel dilatation. The small bowel loops measure up to 4 cm in diameter. Within the left iliac fossa there is a transition to decreased caliber distal small bowel loops, image 116/6. Distal colonic diverticula noted without signs of acute diverticulitis. Vascular/Lymphatic: Aortic atherosclerosis. No aneurysm. Celiac artery and superior mesenteric arteries scratch set celiac artery is patent. Embolization of the splenic artery noted. SMA and IMA are patent. The SMV is patent. There is luminal narrowing of the portal venous confluence which is favored to represent sequelae of pancreatitis, image 54/11. Extrahepatic and intrahepatic portal vein are patent. Aortic atherosclerosis without aneurysm. Enlarged left retroperitoneal lymph nodes are  identified which appear increased from previous exam. Left periaortic node measures 1.2 cm, image 51/11. Previously 1 cm. No pelvic or inguinal adenopathy. Other: There is diffuse edema within the small bowel mesentery. Moderate free fluid within the within the lower abdomen and pelvis identified. This measures between 8 and 22 Hounsfield units. Previously this measured 30 Hounsfield units. Within the upper abdomen there is a large low-density this communicates with the perisplenic hematoma. This extends across the midline of the abdomen with mass effect upon the right and left lobes of liver which are displaced ventrally and rightward. The fluid collection measures 29.2 By 10.8 by 11.3 cm (volume = 1870 cm^3). Hounsfield units within this fluid collection measure 16.7. Musculoskeletal: No acute or significant osseous findings. IMPRESSION: 1. Sequelae of previous ruptured spleen identified. Mixed attenuation perisplenic fluid collection compatible with subacute to chronic hematoma. No signs of active hemorrhage. 2. There is a large low to intermediate fluid collection within the upper abdomen which communicates with the perisplenic hematoma. This fluid collection measures 29.2 x 10.8 x 11.3 cm (volume = 1870 cc). This fluid collection extends across the midline of the abdomen with mass effect upon the right and left lobes of liver which are displaced ventrally and rightward. This is favored to represent large loculated chronic hematoma versus large pseudocyst which communicates with perisplenic hematoma. 3. Resolving acute on chronic pancreatitis. 4. There is abnormal proximal small bowel dilatation with transition to decreased caliber distal small bowel loops. Findings are concerning  for at least a partial small bowel obstruction. 5. Morphologic features of the liver suggestive of cirrhosis. 6. Small left pleural effusion with bilateral lower lobe atelectasis. 7. Chronic pancreatitis. There are several low density  fluid collections associated with the head and distal tail of pancreas. Favor pseudocysts. 8. Enlarged left retroperitoneal lymph nodes are identified which appear increased from previous exam. These are favored to be reactive in the setting of recent pancreatitis. 9. Luminal narrowing of the portal venous confluence is favored to represent sequelae of pancreatitis. 10.  Aortic Atherosclerosis (ICD10-I70.0). Electronically Signed   By: Kerby Moors M.D.   On: 11/09/2022 16:34   DG Chest Portable 1 View  Result Date: 11/09/2022 CLINICAL DATA:  Shortness of breath.  Fall. EXAM: PORTABLE CHEST 1 VIEW COMPARISON:  November 03, 2022 chest x-ray FINDINGS: Possible small effusions with underlying atelectasis, similar in the interval. The cardiomediastinal silhouette is stable. No pneumothorax. No nodules or masses. No interval changes. IMPRESSION: Possible small effusions with underlying atelectasis. No other interval changes. Electronically Signed   By: Dorise Bullion III M.D.   On: 11/09/2022 15:20        Scheduled Meds:  Chlorhexidine Gluconate Cloth  6 each Topical Daily   HYDROmorphone   Intravenous Q4H   lip balm   Topical BID   Continuous Infusions:  amiodarone 30 mg/hr (11/10/22 0715)   famotidine (PEPCID) IV 20 mg (11/10/22 1006)   lactated ringers 100 mL/hr at 11/10/22 0133   methocarbamol (ROBAXIN) IV     piperacillin-tazobactam (ZOSYN)  IV Stopped (11/10/22 0601)   vancomycin       LOS: 1 day    Time spent: 50 minutes    [name], MD [facility] Pager [phone]

## 2022-11-10 NOTE — Progress Notes (Signed)
PHARMACY - TOTAL PARENTERAL NUTRITION CONSULT NOTE   Indication:  severe ileus or compression of his bowel  Patient Measurements: Height: '5\' 7"'$  (170.2 cm) Weight: 58.7 kg (129 lb 6.6 oz) IBW/kg (Calculated) : 66.1 TPN AdjBW (KG): 58.7 Body mass index is 20.27 kg/m. Usual Weight:   Assessment: 38 yoM re-admitted on 3/3 with recurrent fall and shock with giant pseudocyst most likely consistent with worsening severe acute pancreatitis, splenic hematoma/fracturing s/p IR embolizaion 2/23.  Intra-abdominal fluid likely ascites.  Getting NG tube and will be on bowel rest d/t severe ileus or compression of his bowel given his giant pseudocysts.   Glucose / Insulin: Glucose 160.  No hx DM.   Electrolytes: Na and Co2 low; K, Mag, Phos elevated.  CorrCa 9.3 WNL.  Renal: SCr 1.32, increasing brom baseline ~ 0.9.  BUN increased to 38 Hepatic: AST, lipase, Tbili elevated.  Prealbumin < 5, Albumin low at 1.6 Intake / Output; MIVF: LR @ 100 ml/hr GI Imaging: GI Surgeries / Procedures:   Central access: PICC placement ordered 3/4 TPN start date: start 3/4 if central access obtained  Nutritional Goals: Goal TPN rate is 80 mL/hr to provide 105 g of protein and 2043 kcals per day.  RD Assessment: Estimated Needs Total Energy Estimated Needs: 1950-2100 kcals Total Protein Estimated Needs: 90-115 grams Total Fluid Estimated Needs: >/= 1.9L  Current Nutrition:  NPO  Plan:  Start TPN at 55m/hr at 1800 - reduced starting rate due to refeeding risk.  Provides ~ 13 kcal/kg (goal 10-15kcal/kg) - Advance TPN by 200-300 kcal/day if tolerating Electrolytes in TPN: Na 1093m/L, K 29m73mL, Ca 5mE69m, Mg 29mEq17m and Phos 29mmol65m Cl:Ac 1:2 Thiamine '100mg'$  daily x5 days (3/4- 3/8) Add standard MVI and trace elements to TPN Initiate Sensitive q6h SSI and adjust as needed  Reduce MIVF to 70 mL/hr at 1800 Monitor TPN labs on Mon/Thurs, and PRN.  Bmet, Mag, Phos daily x3 days    ChristGretta ArabD,  BCPS [facility] main pharmacy [phone] 9:37 AM

## 2022-11-10 NOTE — Progress Notes (Signed)
Jared Duran YE:[id number] [birth year]  CARE TEAM:  PCP: [name], MD  Outpatient Care Team: Patient Care Team: [name], MD as PCP - General (Family Medicine) [name], MD as Consulting Physician (Gastroenterology) [name], MD as Referring Physician (Surgery)  Inpatient Treatment Team: Treatment Team: Attending Provider: [name], MD; Consulting Physician: [name], Md, MD; Registered Nurse: [name], RN; Rounding Team: [name], MD; Consulting Physician: [name], MD   Problem List:   Principal Problem:   Sepsis [facility]) Active Problems:   Chronic pancreatitis due to chronic alcoholism ([place])   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  [facility] Stay = 1 days)  Plan:  I do not see any abscess or free air or evidence of pancreatic abscess nor intra-abdominal infection at this time.  Do not see any good indication for any operative or interventional radiology drain intervention at this time.  Again splenic hematoma/fracturing status post IR embolization 2/23 with no evidence of bleeding/hemorrhage.  Intra-abdominal fluid most likely consistent with ascites from cirrhosis and/or some hematoma.    NG tube and bowel rest.  Most likely has severe ileus or compression of his bowel given his giant pseudocysts.    I see no strong evidence of necrosis or pancreatic abscess warranting IV antibiotics but defer to primary service.  I do not recommend placing drain in pseudocyst as that can cause chronic pancreatico-cutaneous  fistula.  See if this can stabilize and resolve.  Severely deconditioned and malnourished with recurrent readmissions with progression.  Consider TPN if wishing to be aggressive.  Family discussions limiting care with DNI/DNR.  Do not think we are at the point of transitioning to complete comfort care at this time but readmissions and dwindling concerning for futility and not wanting to extend things.  I think reasonable to be aggressive and reevaluate in 48-72 hours.  Defer to primary service.  Patient and especially daughter concerned.  Some atrial fibrillation now on amiodarone.  With his severe pancreatitis and no evidence of any hemorrhagic shock, would not recommend any operative intervention at this time.  With him being extremely thin and deconditions high risk of dehiscence or fistulas.  Disposition: Determined by primary service.        I reviewed nursing notes, ED provider notes, last 24 h vitals and pain scores, last 48 h intake and output, last 24 h labs and trends, and last 24 h imaging results. I have reviewed this patient's available data, including medical history, events of note, test results, etc as part of my evaluation.  A significant portion of that time was spent in counseling.  Care during the described time interval was provided by me.  This care required moderate level of medical decision making.  11/10/2022    Subjective: (Chief complaint)  Patient feels very distended.  NG tube placed.  Uncomfortable.  Daughter & ICU nursing in room.  Discussed with primary service and DNR/DNI.  Daughter worried about futility given his readmissions and decline.  Objective:  Vital signs:  Vitals:   11/10/22 0000 11/10/22 0100 11/10/22 0400  11/10/22 0459  BP: 109/69 90/70 104/79   Pulse: (!) 108 (!) 109    Resp: (!) 29 (!) 28 (!) 30   Temp: 97.6 F (36.4 C)   97.9 F (36.6 C)  TempSrc: Axillary   Axillary  SpO2: 94% 99%         Intake/Output    Yesterday:  03/03 0701 - 03/04 0700 In: 1107.6 [IV Piggyback:1107.6] Out: -  This shift:  No intake/output data recorded.  Bowel function:  Flatus: No  BM:  No  Drain:  NG tube with thinly bilious effluent.   Physical Exam:  General: Pt awake/alert in mild acute distress.  Cachectic.  Tired and uncomfortable.   Eyes: PERRL, normal EOM.  Sclera clear.  No icterus Neuro: CN II-XII intact w/o focal sensory/motor deficits. Lymph: No head/neck/groin lymphadenopathy Psych:  No delerium/psychosis/paranoia.  Oriented x 4 HENT: Normocephalic, Mucus membranes moist.  No thrush Neck: Supple, No tracheal deviation.  No obvious thyromegaly Chest: No pain to chest wall compression.  Some tachypnea.  Good respiratory excursion.  No audible wheezing no tenderness palpation or step-off concerning for rib fracture or hematoma. CV:  Pulses intact.  Regular rhythm.  No major extremity edema MS: Normal AROM mjr joints.  No obvious deformity  Abdomen: Mostly firm.  Very distended.  Diffuse discomfort to palpation but no rebound tenderness..  No evidence of peritonitis.  No incarcerated hernias.  Ext:   No deformity.  No mjr edema.  No cyanosis Skin: No petechiae / purpurea.  No major sores.  Warm and dry    Results:   Cultures: Recent Results (from the past 720 hour(s))  MRSA Next Gen by PCR, Nasal     Status: None   Collection Time: 10/31/22  1:00 PM   Specimen: Nasal Mucosa; Nasal Swab  Result Value Ref Range Status   MRSA by PCR Next Gen NOT DETECTED NOT DETECTED Final    Comment: (NOTE) The GeneXpert MRSA Assay (FDA approved for NASAL specimens only), is one component of a comprehensive MRSA colonization surveillance program. It is not intended to diagnose MRSA infection nor to guide or monitor treatment for MRSA infections. Test performance is not FDA approved in patients less than 70 years old. Performed at [facility], [place] [address], [place], [place]  [address]   Gram stain     Status: None   Collection Time: 11/03/22  2:30 PM   Specimen: PATH Cytology Pleural fluid  Result Value Ref Range Status   Specimen Description   Final    PLEURAL Performed at [place] [address] [place], [place] [address]    Special Requests   Final    NONE Performed at [facility], [place] [address] [place], [place] [address]    Gram Stain   Final    CYTOSPIN SMEAR WBC PRESENT, PREDOMINANTLY PMN NO ORGANISMS SEEN Performed at [facility], [place] [address], [place], [place] [address]    Report Status 11/03/2022 FINAL  Final  Culture, blood (Routine X 2) w Reflex to ID Panel     Status: None   Collection Time: 11/04/22  9:34 AM   Specimen: BLOOD  Result Value Ref Range Status   Specimen Description   Final    BLOOD BLOOD RIGHT HAND Performed at [place] [address] [place],  [address]    Special Requests   Final    BOTTLES DRAWN AEROBIC AND ANAEROBIC Blood Culture adequate volume  Performed at [facility], [place] [address] [place], [place] [address]    Culture   Final    NO GROWTH 5 DAYS Performed at [facility], [place] [address], [place], [place] [address]    Report Status 11/09/2022 FINAL  Final  Culture, blood (Routine X 2) w Reflex to ID Panel     Status: None   Collection Time: 11/04/22  9:37 AM   Specimen: BLOOD  Result Value Ref Range Status   Specimen Description   Final    BLOOD BLOOD LEFT HAND Performed at [place] [address] [place], [place] [address]    Special Requests   Final    BOTTLES DRAWN AEROBIC AND ANAEROBIC Blood Culture adequate volume Performed at [place] [address], [place], [place] [address]    Culture   Final    NO GROWTH 5 DAYS Performed at [facility], [place] [address] [place], [place] [address]    Report Status 11/09/2022 FINAL  Final  MRSA  Next Gen by PCR, Nasal     Status: None   Collection Time: 11/09/22  8:34 PM   Specimen: Nasal Mucosa; Nasal Swab  Result Value Ref Range Status   MRSA by PCR Next Gen NOT DETECTED NOT DETECTED Final    Comment: (NOTE) The GeneXpert MRSA Assay (FDA approved for NASAL specimens only), is one component of a comprehensive MRSA colonization surveillance program. It is not intended to diagnose MRSA infection nor to guide or monitor treatment for MRSA infections. Test performance is not FDA approved in patients less than 75 years old. Performed at [facility], [place] [address], [place], [place] [address]   Culture, blood (Routine X 2) w Reflex to ID Panel     Status: None (Preliminary result)   Collection Time: 11/09/22  9:20 PM   Specimen: BLOOD RIGHT HAND  Result Value Ref Range Status   Specimen Description   Final    BLOOD RIGHT HAND Performed at [facility], [place] [address] [place], [place] [address]    Special Requests   Final    BOTTLES DRAWN AEROBIC ONLY Blood Culture adequate volume Performed at [place] [address], [place], [place] [address]    Culture PENDING  Incomplete   Report Status PENDING  Incomplete  Culture, blood (Routine X 2) w Reflex to ID Panel     Status: None (Preliminary result)   Collection Time: 11/09/22  9:20 PM   Specimen: BLOOD LEFT HAND  Result Value Ref Range Status   Specimen Description   Final    BLOOD LEFT HAND Performed at [facility], [place] [address] [place], [place] [address]    Special Requests   Final    BOTTLES DRAWN AEROBIC ONLY Blood Culture adequate volume Performed at [place] [address] [place], [place] [address]    Culture PENDING  Incomplete   Report Status PENDING  Incomplete    Labs: Results for orders placed or performed during the hospital encounter of 11/09/22 (from the past 48 hour(s))  CBC with Differential     Status: Abnormal    Collection Time: 11/09/22  2:40 PM  Result Value Ref Range   WBC 28.8 (H) 4.0 - 10.5 K/uL   RBC 2.96 (L) 4.22 - 5.81 MIL/uL   Hemoglobin 9.3 (L) 13.0 - 17.0 g/dL   HCT 28.1 (L) 39.0 - 52.0 %  MCV 94.9 80.0 - 100.0 fL   MCH 31.4 26.0 - 34.0 pg   MCHC 33.1 30.0 - 36.0 g/dL   RDW 15.6 (H) 11.5 - 15.5 %   Platelets 533 (H) 150 - 400 K/uL   nRBC 0.1 0.0 - 0.2 %   Neutrophils Relative % 85 %   Neutro Abs 24.5 (H) 1.7 - 7.7 K/uL   Lymphocytes Relative 5 %   Lymphs Abs 1.3 0.7 - 4.0 K/uL   Monocytes Relative 6 %   Monocytes Absolute 1.6 (H) 0.1 - 1.0 K/uL   Eosinophils Relative 0 %   Eosinophils Absolute 0.1 0.0 - 0.5 K/uL   Basophils Relative 0 %   Basophils Absolute 0.1 0.0 - 0.1 K/uL   WBC Morphology MILD LEFT SHIFT (1-5% METAS, OCC MYELO, OCC BANDS)    Immature Granulocytes 4 %   Abs Immature Granulocytes 1.24 (H) 0.00 - 0.07 K/uL    Comment: Performed at [facility], [place] [address] [place], [place] [address]  Protime-INR     Status: None   Collection Time: 11/09/22  2:40 PM  Result Value Ref Range   Prothrombin Time 14.3 11.4 - 15.2 seconds   INR 1.1 0.8 - 1.2    Comment: (NOTE) INR goal varies based on device and disease states. Performed at [facility], [place] [address] [place], [place] [address]   Comprehensive metabolic panel     Status: Abnormal   Collection Time: 11/09/22  2:40 PM  Result Value Ref Range   Sodium 134 (L) 135 - 145 mmol/L   Potassium 4.3 3.5 - 5.1 mmol/L   Chloride 102 98 - 111 mmol/L   CO2 24 22 - 32 mmol/L   Glucose, Bld 136 (H) 70 - 99 mg/dL    Comment: Glucose reference range applies only to samples taken after fasting for at least 8 hours.   BUN 35 (H) 8 - 23 mg/dL   Creatinine, Ser 1.25 (H) 0.61 - 1.24 mg/dL   Calcium 7.8 (L) 8.9 - 10.3 mg/dL   Total Protein 7.1 6.5 - 8.1 g/dL   Albumin 1.9 (L) 3.5 - 5.0 g/dL   AST 36 15 - 41 U/L   ALT 17 0 - 44 U/L   Alkaline Phosphatase 125 38 - 126 U/L   Total  Bilirubin 2.2 (H) 0.3 - 1.2 mg/dL   GFR, Estimated >60 >60 mL/min    Comment: (NOTE) Calculated using the CKD-EPI Creatinine Equation (2021)    Anion gap 8 5 - 15    Comment: Performed at [facility], [place] [address], [place], [place] [address]  Lipase, blood     Status: Abnormal   Collection Time: 11/09/22  2:40 PM  Result Value Ref Range   Lipase 164 (H) 11 - 51 U/L    Comment: Performed at [facility], [place] [address], [place], [place] [address]  Type and screen [place]     Status: None   Collection Time: 11/09/22  2:41 PM  Result Value Ref Range   ABO/RH(D) O POS    Antibody Screen NEG    Sample Expiration      11/12/2022,2359 Performed at [facility], [place] [address], [place],  [address]   I-stat chem 8, ED     Status: Abnormal   Collection Time: 11/09/22  3:10 PM  Result Value Ref Range   Sodium 135 135 - 145 mmol/L   Potassium 4.4 3.5 -  5.1 mmol/L   Chloride 101 98 - 111 mmol/L   BUN 34 (H) 8 - 23 mg/dL   Creatinine, Ser 1.20 0.61 - 1.24 mg/dL   Glucose, Bld 132 (H) 70 - 99 mg/dL    Comment: Glucose reference range applies only to samples taken after fasting for at least 8 hours.   Calcium, Ion 1.08 (L) 1.15 - 1.40 mmol/L   TCO2 25 22 - 32 mmol/L   Hemoglobin 10.5 (L) 13.0 - 17.0 g/dL   HCT 31.0 (L) 39.0 - 52.0 %  Magnesium     Status: Abnormal   Collection Time: 11/09/22  3:10 PM  Result Value Ref Range   Magnesium 2.6 (H) 1.7 - 2.4 mg/dL    Comment: Performed at [facility], [place] [address] [place], [place] [address]  Phosphorus     Status: Abnormal   Collection Time: 11/09/22  3:10 PM  Result Value Ref Range   Phosphorus 4.9 (H) 2.5 - 4.6 mg/dL    Comment: Performed at [facility], [place] [address] [place], [place] [address]  MRSA Next Gen by PCR, Nasal     Status: None   Collection Time: 11/09/22  8:34 PM   Specimen: Nasal Mucosa;  Nasal Swab  Result Value Ref Range   MRSA by PCR Next Gen NOT DETECTED NOT DETECTED    Comment: (NOTE) The GeneXpert MRSA Assay (FDA approved for NASAL specimens only), is one component of a comprehensive MRSA colonization surveillance program. It is not intended to diagnose MRSA infection nor to guide or monitor treatment for MRSA infections. Test performance is not FDA approved in patients less than 72 years old. Performed at [facility], [place] [address] [place], [place] [address]   Ethanol     Status: None   Collection Time: 11/09/22  9:20 PM  Result Value Ref Range   Alcohol, Ethyl (B) <10 <10 mg/dL    Comment: (NOTE) Lowest detectable limit for serum alcohol is 10 mg/dL.  For medical purposes only. Performed at [facility], [place] [address] [place], [place] [address]   Culture, blood (Routine X 2) w Reflex to ID Panel     Status: None (Preliminary result)   Collection Time: 11/09/22  9:20 PM   Specimen: BLOOD RIGHT HAND  Result Value Ref Range   Specimen Description      BLOOD RIGHT HAND Performed at [facility], [place] [address] [place], [place] [address]    Special Requests      BOTTLES DRAWN AEROBIC ONLY Blood Culture adequate volume Performed at [place] [address] [place], [place] [address]    Culture PENDING    Report Status PENDING   Culture, blood (Routine X 2) w Reflex to ID Panel     Status: None (Preliminary result)   Collection Time: 11/09/22  9:20 PM   Specimen: BLOOD LEFT HAND  Result Value Ref Range   Specimen Description      BLOOD LEFT HAND Performed at [facility], [place] [address], [place], [place] [address]    Special Requests      BOTTLES DRAWN AEROBIC ONLY Blood Culture adequate volume Performed at [place] [address], [place],  [address]    Culture PENDING    Report Status PENDING   CBC     Status: Abnormal   Collection Time:  11/09/22  9:20 PM  Result Value Ref Range   WBC  22.8 (H) 4.0 - 10.5 K/uL   RBC 2.88 (L) 4.22 - 5.81 MIL/uL   Hemoglobin 8.9 (L) 13.0 - 17.0 g/dL   HCT 27.6 (L) 39.0 - 52.0 %   MCV 95.8 80.0 - 100.0 fL   MCH 30.9 26.0 - 34.0 pg   MCHC 32.2 30.0 - 36.0 g/dL   RDW 15.8 (H) 11.5 - 15.5 %   Platelets 582 (H) 150 - 400 K/uL   nRBC 0.3 (H) 0.0 - 0.2 %    Comment: Performed at [facility], [place] [address] [place], [place] [address]  Creatinine, serum     Status: Abnormal   Collection Time: 11/09/22  9:20 PM  Result Value Ref Range   Creatinine, Ser 1.29 (H) 0.61 - 1.24 mg/dL   GFR, Estimated >60 >60 mL/min    Comment: (NOTE) Calculated using the CKD-EPI Creatinine Equation (2021) Performed at [facility], [place] [address], [place], [place] [address]   Lipase, blood     Status: Abnormal   Collection Time: 11/10/22 12:12 AM  Result Value Ref Range   Lipase 303 (H) 11 - 51 U/L    Comment: Performed at [facility], [place] [address] [place], [place] [address]  CBC     Status: Abnormal   Collection Time: 11/10/22 12:12 AM  Result Value Ref Range   WBC 20.2 (H) 4.0 - 10.5 K/uL   RBC 3.64 (L) 4.22 - 5.81 MIL/uL   Hemoglobin 11.2 (L) 13.0 - 17.0 g/dL    Comment: REPEATED TO VERIFY   HCT 35.0 (L) 39.0 - 52.0 %   MCV 96.2 80.0 - 100.0 fL   MCH 30.8 26.0 - 34.0 pg   MCHC 32.0 30.0 - 36.0 g/dL   RDW 15.8 (H) 11.5 - 15.5 %   Platelets 697 (H) 150 - 400 K/uL   nRBC 0.5 (H) 0.0 - 0.2 %    Comment: Performed at [facility], [place] [address], [place], [place] [address]  Prealbumin     Status: Abnormal   Collection Time: 11/10/22 12:12 AM  Result Value Ref Range   Prealbumin <5 (L) 18 - 38 mg/dL    Comment: Performed at [place] [address] [place], [place] [address]  Comprehensive metabolic panel     Status: Abnormal   Collection Time: 11/10/22 12:12 AM  Result Value Ref Range   Sodium 132 (L) 135 - 145  mmol/L   Potassium 6.2 (H) 3.5 - 5.1 mmol/L    Comment: HEMOLYSIS AT THIS LEVEL MAY AFFECT RESULT DELTA CHECK NOTED    Chloride 103 98 - 111 mmol/L   CO2 21 (L) 22 - 32 mmol/L   Glucose, Bld 160 (H) 70 - 99 mg/dL    Comment: Glucose reference range applies only to samples taken after fasting for at least 8 hours.   BUN 38 (H) 8 - 23 mg/dL   Creatinine, Ser 1.32 (H) 0.61 - 1.24 mg/dL   Calcium 7.4 (L) 8.9 - 10.3 mg/dL   Total Protein 6.0 (L) 6.5 - 8.1 g/dL   Albumin 1.6 (L) 3.5 - 5.0 g/dL   AST 44 (H) 15 - 41 U/L    Comment: HEMOLYSIS AT THIS LEVEL MAY AFFECT RESULT   ALT 15 0 - 44 U/L    Comment: HEMOLYSIS AT THIS LEVEL MAY AFFECT RESULT   Alkaline Phosphatase 113 38 - 126 U/L   Total Bilirubin 1.9 (H) 0.3 - 1.2 mg/dL  Comment: HEMOLYSIS AT THIS LEVEL MAY AFFECT RESULT   GFR, Estimated 58 (L) >60 mL/min    Comment: (NOTE) Calculated using the CKD-EPI Creatinine Equation (2021)    Anion gap 8 5 - 15    Comment: Performed at [facility], [place] [address] [place], [place] [address]  Glucose, capillary     Status: Abnormal   Collection Time: 11/10/22  1:48 AM  Result Value Ref Range   Glucose-Capillary 142 (H) 70 - 99 mg/dL    Comment: Glucose reference range applies only to samples taken after fasting for at least 8 hours.    Imaging / Studies: DG Abd Portable 1 View  Result Date: 11/09/2022 CLINICAL DATA:  NG tube placement EXAM: PORTABLE ABDOMEN - 1 VIEW COMPARISON:  November 09, 2022, November 04, 2022 FINDINGS: Incomplete visualization of the inferior abdomen and pelvis. Enteric tube tip and side port project over the stomach. Small LEFT pleural effusion. Bibasilar atelectasis. Mildly prominent loops of bowel in the LEFT hemiabdomen. IMPRESSION: Enteric tube tip and side port project over the stomach. Electronically Signed   By: Valentino Saxon M.D.   On: 11/09/2022 17:54   CT LIVER ABDOMEN W WO CONTRAST  Result Date: 11/09/2022 CLINICAL DATA:  Recent  splenic rupture with subsequent splenic artery embolization. EXAM: CT ABDOMEN WITHOUT AND WITH CONTRAST TECHNIQUE: Multidetector CT imaging of the abdomen was performed following the standard protocol before and following the bolus administration of intravenous contrast. RADIATION DOSE REDUCTION: This exam was performed according to the departmental dose-optimization program which includes automated exposure control, adjustment of the mA and/or kV according to patient size and/or use of iterative reconstruction technique. CONTRAST:  152m OMNIPAQUE IOHEXOL 350 MG/ML SOLN COMPARISON:  10/30/2022 FINDINGS: Lower chest: There is a small left pleural effusion. Atelectasis involving the left lower lobe and right lower lobe identified. Hepatobiliary: Contour of liver is nodular. Small cyst within dome of liver. Several small subcentimeter low-density foci are identified which are unchanged and likely represent small cysts. Gallbladder appears normal. Unchanged intrahepatic and common bile duct dilatation. CBD measures 9 mm up to the head of the pancreas where changes of chronic pancreatitis are again seen. Pancreas: There are changes of chronic pancreatitis with extensive pancreatic calcifications. There are several low density fluid collections associated with the head and distal tail of pancreas. Favor pseudo cysts. The largest arises off the tail of pancreas measuring 3.6 x 1.8 cm. Previously this measured 3.7 x 2.3 cm. Interval decrease in peripancreatic inflammation, free fluid in fat stranding. Spleen: Sequelae of splenic rupture identified. Mixed attenuation perisplenic fluid collection is again noted compatible with subacute to chronic hematoma. This measures 7.0 x 8.5 by 6 10.1 cm. There are no signs of current IV contrast extravasation to suggest active hemorrhage. Adrenals/Urinary Tract: Normal adrenal glands. No hydronephrosis, nephrolithiasis or suspicious mass. 2.7 cm Bosniak class 1 cyst arises off the  upper pole of the right kidney. No follow-up imaging recommended. Urinary bladder is unremarkable. Stomach/Bowel: Stomach appears normal. There is abnormal proximal small bowel dilatation. The small bowel loops measure up to 4 cm in diameter. Within the left iliac fossa there is a transition to decreased caliber distal small bowel loops, image 116/6. Distal colonic diverticula noted without signs of acute diverticulitis. Vascular/Lymphatic: Aortic atherosclerosis. No aneurysm. Celiac artery and superior mesenteric arteries scratch set celiac artery is patent. Embolization of the splenic artery noted. SMA and IMA are patent. The SMV is patent. There is luminal narrowing of the portal venous confluence  which is favored to represent sequelae of pancreatitis, image 54/11. Extrahepatic and intrahepatic portal vein are patent. Aortic atherosclerosis without aneurysm. Enlarged left retroperitoneal lymph nodes are identified which appear increased from previous exam. Left periaortic node measures 1.2 cm, image 51/11. Previously 1 cm. No pelvic or inguinal adenopathy. Other: There is diffuse edema within the small bowel mesentery. Moderate free fluid within the within the lower abdomen and pelvis identified. This measures between 8 and 22 Hounsfield units. Previously this measured 30 Hounsfield units. Within the upper abdomen there is a large low-density this communicates with the perisplenic hematoma. This extends across the midline of the abdomen with mass effect upon the right and left lobes of liver which are displaced ventrally and rightward. The fluid collection measures 29.2 By 10.8 by 11.3 cm (volume = 1870 cm^3). Hounsfield units within this fluid collection measure 16.7. Musculoskeletal: No acute or significant osseous findings. IMPRESSION: 1. Sequelae of previous ruptured spleen identified. Mixed attenuation perisplenic fluid collection compatible with subacute to chronic hematoma. No signs of active hemorrhage.  2. There is a large low to intermediate fluid collection within the upper abdomen which communicates with the perisplenic hematoma. This fluid collection measures 29.2 x 10.8 x 11.3 cm (volume = 1870 cc). This fluid collection extends across the midline of the abdomen with mass effect upon the right and left lobes of liver which are displaced ventrally and rightward. This is favored to represent large loculated chronic hematoma versus large pseudocyst which communicates with perisplenic hematoma. 3. Resolving acute on chronic pancreatitis. 4. There is abnormal proximal small bowel dilatation with transition to decreased caliber distal small bowel loops. Findings are concerning for at least a partial small bowel obstruction. 5. Morphologic features of the liver suggestive of cirrhosis. 6. Small left pleural effusion with bilateral lower lobe atelectasis. 7. Chronic pancreatitis. There are several low density fluid collections associated with the head and distal tail of pancreas. Favor pseudocysts. 8. Enlarged left retroperitoneal lymph nodes are identified which appear increased from previous exam. These are favored to be reactive in the setting of recent pancreatitis. 9. Luminal narrowing of the portal venous confluence is favored to represent sequelae of pancreatitis. 10.  Aortic Atherosclerosis (ICD10-I70.0). Electronically Signed   By: Kerby Moors M.D.   On: 11/09/2022 16:34   DG Chest Portable 1 View  Result Date: 11/09/2022 CLINICAL DATA:  Shortness of breath.  Fall. EXAM: PORTABLE CHEST 1 VIEW COMPARISON:  November 03, 2022 chest x-ray FINDINGS: Possible small effusions with underlying atelectasis, similar in the interval. The cardiomediastinal silhouette is stable. No pneumothorax. No nodules or masses. No interval changes. IMPRESSION: Possible small effusions with underlying atelectasis. No other interval changes. Electronically Signed   By: Dorise Bullion III M.D.   On: 11/09/2022 15:20     Medications / Allergies: per chart  Antibiotics: Anti-infectives (From admission, onward)    Start     Dose/Rate Route Frequency Ordered Stop   11/10/22 2000  vancomycin (VANCOCIN) IVPB 1000 mg/200 mL premix        1,000 mg 200 mL/hr over 60 Minutes Intravenous Every 24 hours 11/09/22 1932     11/10/22 0200  piperacillin-tazobactam (ZOSYN) IVPB 3.375 g        3.375 g 12.5 mL/hr over 240 Minutes Intravenous Every 8 hours 11/09/22 1929     11/09/22 2145  piperacillin-tazo (ZOSYN) NICU IV syringe 225 mg/mL  Status:  Discontinued        100 mg/kg of piperacillin  58.7 kg 58.8 mL/hr  over 30 Minutes Intravenous Every 8 hours 11/09/22 2058 11/09/22 2109   11/09/22 1915  piperacillin-tazobactam (ZOSYN) IVPB 3.375 g        3.375 g 100 mL/hr over 30 Minutes Intravenous NOW 11/09/22 1906 11/09/22 1950   11/09/22 1915  vancomycin (VANCOREADY) IVPB 1250 mg/250 mL        1,250 mg 166.7 mL/hr over 90 Minutes Intravenous NOW 11/09/22 1906 11/10/22 0340         Note: Portions of this report may have been transcribed using voice recognition software. Every effort was made to ensure accuracy; however, inadvertent computerized transcription errors may be present.   Any transcriptional errors that result from this process are unintentional.    Adin Hector, MD, FACS, MASCRS Esophageal, Gastrointestinal & Colorectal Surgery Robotic and Minimally Invasive Surgery  [facility]. [address] [place], [place] [address] [phone] Fax [phone] Main  CONTACT INFORMATION:  Weekday (9AM-5PM): Call [facility] main office at [phone]  Weeknight (5PM-9AM) or Weekend/Holiday: Check [email or web address] (password " [email or web address]") for General Surgery [facility] coverage  (Please, do not use SecureChat as it is not reliable communication to reach operating surgeons for immediate patient care given surgeries/outpatient duties/clinic/cross-coverage/off  post-call which would lead to a delay in care.  Epic staff messaging available for outptient concerns, but may not be answered for 48 hours or more).     11/10/2022  6:44 AM

## 2022-11-10 NOTE — Progress Notes (Signed)
Initial Nutrition Assessment  DOCUMENTATION CODES:   Severe malnutrition in context of chronic illness  INTERVENTION:  - TPN to start tonight at 64m/hr per pharmacy which provides 40g protein, 98g dextrose, 216 IL kcals = 766 kcals (13kcal/kg)  - TPN management per pharmacy.   - Monitor magnesium, potassium, and phosphorus BID for at least 3 days, MD to replete as needed, as pt is at risk for refeeding syndrome given severe malnutrition. - Recommend '100mg'$  thiamine x5 days in TPN to aid in macronutrient metabolism.  - Would recommend increasing by only 200-300 kcals per day due to refeeding risk. If electrolytes drop significantly would hold at same rate/macros for another day.  - Monitor weight trends closely.    NUTRITION DIAGNOSIS:   Severe Malnutrition related to chronic illness as evidenced by energy intake < or equal to 50% for > or equal to 5 days, percent weight loss (5% in 3 weeks).  GOAL:   Patient will meet greater than or equal to 90% of their needs  MONITOR:   Diet advancement, Labs, Weight trends  REASON FOR ASSESSMENT:   Consult New TPN/TNA  ASSESSMENT:   72y.o. 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 Patient was recently hospitalized from 02/22 to 11/06/22 for spontaneous splenic hemorrhage. He underwent splenic artery arteriography and embolization.  Patient arrived at home 3 days ago, he was very weak and deconditioned, had a fall while getting into his home. He has been experiencing worsening abdominal pain, severe in intensity with no improving factors, worse with movement and touch, associated with abdominal distention, nausea, vomiting and diarrhea. Admitted for sepsis and ileus.   Met with patient and family (daughter, sister, wife, and nieces at bedside. All contributed to patient's nutrition history.   Patient reports a UBW of 163# he last weighed around 6 months and and steady weight loss since that time.  Per EMR, patient has not weighed as much as 160# since 2018. Within the past year, patient weighed at 137# in April 2023 and then 136# in mid February 2024. However, since that time patient has dropped to current weight of 129#. This is a 7# or 5% weight loss in 3 weeks, which is significant for the time frame.   Patient and family report patient has been eating "terrible" for almost 4 months. Did not elaborate on meals/snacks but wife reports patient was drinking 2 Ensures daily at home. Appetite has been extremely poor recently and niece notes that the past few days he hasn't been able to drink even water.  Patient currently NPO and per surgery, plan to start TPN this evening after PICC placed. He is at very high risk for refeeding syndrome due to significant weight loss in the past month, inadequate/minimal intake for an extended period of time, and what appeared to be severe muscle wasting (unable to complete full exam due to patient in severe pain).   Discussed TPN with pharmacist. Plan to add thiamine and start TPN at only 349mhr this evening. Will need close monitoring of electrolytes.  Of note, patient is DNR/DNI and hospitalist note today indicates that if patient to decompensate further plan would be for comfort/hospice.   Medications reviewed and include: Vancomycin  Labs reviewed:  Na 132 K+ 6.2 Creatinine 1.32   NUTRITION - FOCUSED PHYSICAL EXAM:  Patient/family declined exam as patient in extreme pain. Will attempt at next visit  Diet Order:   Diet Order  Diet NPO time specified Except for: Ice Chips  Diet effective now                   EDUCATION NEEDS:  Education needs have been addressed  Skin:  Skin Assessment: Reviewed RN Assessment  Last BM:  3/4  Height:  Ht Readings from Last 1 Encounters:  11/10/22 '5\' 7"'$  (1.702 m)   Weight:  Wt Readings from Last 1 Encounters:  11/10/22 58.7 kg    BMI:  Body mass index is 20.27  kg/m.  Estimated Nutritional Needs:  Kcal:  1950-2100 kcals Protein:  90-115 grams Fluid:  >/= 1.9L   Samson Frederic RD, LDN For contact information, refer to [facility].

## 2022-11-10 NOTE — Progress Notes (Signed)
PHARMACY NOTE:  RENAL DOSAGE ADJUSTMENT  SCr 2, CrCl 28.9 ml/min  Plan: Decrease famotidine to 20 mg IV q24 hrs ( from q12h)   Thank you for allowing pharmacy to be a part of this patient's care.   Eudelia Bunch, Pharm.D Use secure chat for questions 11/10/2022 5:25 PM

## 2022-11-11 DIAGNOSIS — Z515 Encounter for palliative care: Secondary | ICD-10-CM

## 2022-11-11 DIAGNOSIS — K860 Alcohol-induced chronic pancreatitis: Secondary | ICD-10-CM | POA: Diagnosis not present

## 2022-11-11 DIAGNOSIS — A419 Sepsis, unspecified organism: Secondary | ICD-10-CM | POA: Diagnosis not present

## 2022-11-11 DIAGNOSIS — K567 Ileus, unspecified: Secondary | ICD-10-CM | POA: Diagnosis not present

## 2022-11-11 DIAGNOSIS — K7031 Alcoholic cirrhosis of liver with ascites: Secondary | ICD-10-CM | POA: Diagnosis not present

## 2022-11-11 LAB — HEMOGLOBIN A1C
Hgb A1c MFr Bld: 4.9 % (ref 4.8–5.6)
Mean Plasma Glucose: 94 mg/dL

## 2022-11-11 MED ORDER — GLYCOPYRROLATE 0.2 MG/ML IJ SOLN: 0.2000 mg | INTRAMUSCULAR | Status: DC | PRN

## 2022-11-11 MED ORDER — SODIUM CHLORIDE 0.9 % IV SOLN: 250.0000 mL | INTRAVENOUS | Status: DC | PRN

## 2022-11-11 MED ORDER — LORAZEPAM 2 MG/ML PO CONC: 1.0000 mg | ORAL | Status: DC | PRN

## 2022-11-11 MED ORDER — LORAZEPAM 1 MG PO TABS: 1.0000 mg | ORAL_TABLET | ORAL | Status: DC | PRN

## 2022-11-11 MED ORDER — GLYCOPYRROLATE 1 MG PO TABS: 1.0000 mg | ORAL_TABLET | ORAL | Status: DC | PRN

## 2022-11-11 MED ORDER — HALOPERIDOL LACTATE 5 MG/ML IJ SOLN: 0.5000 mg | INTRAMUSCULAR | Status: DC | PRN

## 2022-11-11 MED ORDER — ONDANSETRON HCL 4 MG/2ML IJ SOLN: 4.0000 mg | Freq: Four times a day (QID) | INTRAMUSCULAR | Status: DC | PRN

## 2022-11-11 MED ORDER — SODIUM CHLORIDE 0.9% FLUSH: 3.0000 mL | INTRAVENOUS | Status: DC | PRN

## 2022-11-11 MED ORDER — ONDANSETRON 4 MG PO TBDP: 4.0000 mg | ORAL_TABLET | Freq: Four times a day (QID) | ORAL | Status: DC | PRN

## 2022-11-11 MED ORDER — SODIUM CHLORIDE 0.9 % IV SOLN
1.0000 mg/h | INTRAVENOUS | Status: DC
  Administered 2022-11-11 – 2022-11-12 (×2): 1 mg/h via INTRAVENOUS
  Filled 2022-11-11 (×2): qty 5

## 2022-11-11 MED ORDER — SODIUM CHLORIDE 0.9% FLUSH
3.0000 mL | Freq: Two times a day (BID) | INTRAVENOUS | Status: DC
  Administered 2022-11-11 – 2022-11-12 (×3): 3 mL via INTRAVENOUS

## 2022-11-11 MED ORDER — HALOPERIDOL 1 MG PO TABS: 0.5000 mg | ORAL_TABLET | ORAL | Status: DC | PRN

## 2022-11-11 MED ORDER — HALOPERIDOL LACTATE 2 MG/ML PO CONC: 0.5000 mg | ORAL | Status: DC | PRN

## 2022-11-11 MED ORDER — LORAZEPAM 2 MG/ML IJ SOLN
1.0000 mg | INTRAMUSCULAR | Status: DC | PRN
  Administered 2022-11-11 – 2022-11-12 (×2): 1 mg via INTRAVENOUS
  Filled 2022-11-11 (×2): qty 1

## 2022-11-11 NOTE — Progress Notes (Signed)
Chaplain met with Jared Duran's family including his daughter, sister, niece and nephew to provide support.  Chaplain normalized grief responses and provided support around creating the environment they want to have for Jared Duran to transition peacefully. The family shared about the way that [place] cared for them throughout their lives.  He has been their protector, their supporter and the one who remembers what is important for each family member.  They are supporting each other well and they will be taking turns being in the room with him.  They requested prayer, which chaplain provided.  [address], [place] Pager, [phone]

## 2022-11-11 NOTE — Progress Notes (Signed)
Full note to follow . Changing to comfort care and hospice level of care . Pursue [facility] for end [facility] life care

## 2022-11-11 NOTE — Progress Notes (Signed)
PROGRESS NOTE    Jared Duran  [id number] DOB: 29-Oct-1950 DOA: 11/09/2022 PCP: [name], MD    Brief Narrative:  72 year old gentleman with history of chronic recurrent pancreatitis, requiring multiple hospitalization Recent admission 2/22-2/29 for a spontaneous splenic hemorrhage complicated by leukocytosis and left-sided pleural effusion status post splenic artery arteriogram and embolization, blood transfusions and left-sided thoracentesis Comes back 3/3 with worsening abdominal pain, abdominal distention, nausea vomiting and diarrhea, night sweats and fevers.  Very weak and deconditioned.  Found to have significant abdomen distention, hypoxemia with oxygen saturation 86% on room air, he was placed on 6 L of oxygen and transported to the ER. In the emergency room blood pressures 89/51, WBC count 22.8, hemoglobin 8.9, CT scan with large perisplenic hematoma 30 cm in size.  NG tube was placed.  Surgery was consulted.  Admitted with surgical consultation.  Fluid resuscitation and antibiotics started.  Patient also developed rapid A-fib and started on amiodarone.  Assessment & Plan:   Severe sepsis versus hypovolemic shock secondary to intra-abdominal hematoma: Ileus in the setting of pancreatitis and pseudocyst Severe pain and suffering.  Intractable pain.  Unable to eat. She reported calorie malnutrition. Acute kidney injury on CKD stage II, now anuric. New onset A-fib with RVR.  Plan: Detailed surgical consultation, likely loculated hematoma along with multiple pancreatic pseudocyst.  Not amenable to surgery.  Conservatively treated with maintenance IV fluids, pain medications.  Amiodarone infusion.  NG tube decompression.   Patient remains in very poor clinical status.  He is very painful, unable to stay awake.  He is severely distressed.  There is no surgical option for drainage of hematoma or his complex cyst.   Patient had clearly stated goal to stay comfortable and not to  suffer like this.    With patient's severe morbidity, current severe distress and no appropriate surgical options available.  Patient also desired to stay comfortable and not to suffer.  Patient's family at the bedside.  Shared decision making was done and decided that patient is best served with comfort care and hospice. Daughter and sister at the bedside. Changed to comfort care and hospice. Start Dilaudid infusion 1 mg/h, increase as needed.  All other symptom control medications available. Discontinue antibiotics, IV fluids, discontinue NG tube.  Continue Foley catheter for comfort. Patient is at end-of-life, family desires patient to be transferred to hospice at [facility] for inpatient hospice. Will monitor him today in the hospital, depends upon his clinical status will transfer to [facility] if they have bed available.  Referral sent. Unrestricted visitor policy. Transfer to MedSurg bed. RN to pronounce death if happens in the hospital.   DVT prophylaxis: SCDs Start: 11/09/22 2059   Code Status: DNR/DNI comfort care Family Communication: Daughter and sister at the bedside. Disposition Plan: Status is: Inpatient Remains inpatient appropriate because: End-of-life     Consultants:  General surgery  Procedures:  None  Antimicrobials:  Vancomycin and Zosyn 3/3--- 3/5   Subjective:  Patient seen and examined in the morning rounds.  Daughter and sister at the bedside.  They stayed all night with him, he looked very uncomfortable at times.  He does not let us touch his belly.  His voice is muffled.  Blood pressures and heart rate remained stable, however he is very uncomfortable on attempted exam. See detailed notes above.  Converting to comfort care.  Objective: Vitals:   11/11/22 0900 11/11/22 1000 11/11/22 1100 11/11/22 1200  BP:      Pulse: 79  79 80 78  Resp: '15 20 14 '$ (!) 22  Temp:      TempSrc:      SpO2: 99% 98% 98% 99%  Weight:      Height:         Intake/Output Summary (Last 24 hours) at 11/11/2022 1257 Last data filed at 11/11/2022 0600 Gross per 24 hour  Intake 1810.7 ml  Output 100 ml  Net 1710.7 ml    Filed Weights   11/10/22 0825  Weight: 58.7 kg    Examination:  General exam: Very sick looking.  In moderate distress even after pain medication.   Alert on stimulation, unable to keep up conversation. Respiratory system: Clear to auscultation.  Poor inspiratory effort.  On minimal oxygen. Cardiovascular system: S1 & S2 heard, regular rate rhythm.  Tachycardic.  Gastrointestinal system: Very tight, distended, severe pain on light touch.  Bowel sounds absent.   Data Reviewed: I have personally reviewed following labs and imaging studies  CBC: Recent Labs  Lab 11/05/22 0557 11/06/22 0648 11/09/22 1440 11/09/22 1510 11/09/22 2120 11/10/22 0012  WBC 24.1* 24.8* 28.8*  --  22.8* 20.2*  NEUTROABS  --  20.8* 24.5*  --   --   --   HGB 8.3* 8.8* 9.3* 10.5* 8.9* 11.2*  HCT 25.5* 27.7* 28.1* 31.0* 27.6* 35.0*  MCV 97.3 97.9 94.9  --  95.8 96.2  PLT 285 353 533*  --  582* 697*    Basic Metabolic Panel: Recent Labs  Lab 11/05/22 0557 11/09/22 1440 11/09/22 1510 11/09/22 2120 11/10/22 0012 11/10/22 1227  NA 133* 134* 135  --  132* 133*  K 3.9 4.3 4.4  --  6.2* 5.1  CL 104 102 101  --  103 104  CO2 24 24  --   --  21* 18*  GLUCOSE 113* 136* 132*  --  160* 157*  BUN 16 35* 34*  --  38* 48*  CREATININE 0.97 1.25* 1.20 1.29* 1.32* 2.00*  CALCIUM 7.9* 7.8*  --   --  7.4* 7.3*  MG  --   --  2.6*  --   --  2.3  PHOS  --   --  4.9*  --   --  6.1*    GFR: Estimated Creatinine Clearance: 28.9 mL/min (A) (by C-G formula based on SCr of 2 mg/dL (H)). Liver Function Tests: Recent Labs  Lab 11/09/22 1440 11/10/22 0012  AST 36 44*  ALT 17 15  ALKPHOS 125 113  BILITOT 2.2* 1.9*  PROT 7.1 6.0*  ALBUMIN 1.9* 1.6*    Recent Labs  Lab 11/09/22 1440 11/10/22 0012  LIPASE 164* 303*    No results for  input(s): "AMMONIA" in the last 168 hours. Coagulation Profile: Recent Labs  Lab 11/09/22 1440  INR 1.1    Cardiac Enzymes: No results for input(s): "CKTOTAL", "CKMB", "CKMBINDEX", "TROPONINI" in the last 168 hours. BNP (last 3 results) No results for input(s): "PROBNP" in the last 8760 hours. HbA1C: Recent Labs    11/10/22 0012  HGBA1C 4.9   CBG: Recent Labs  Lab 11/10/22 0148 11/10/22 1719  GLUCAP 142* 133*    Lipid Profile: No results for input(s): "CHOL", "HDL", "LDLCALC", "TRIG", "CHOLHDL", "LDLDIRECT" in the last 72 hours. Thyroid Function Tests: No results for input(s): "TSH", "T4TOTAL", "FREET4", "T3FREE", "THYROIDAB" in the last 72 hours. Anemia Panel: No results for input(s): "VITAMINB12", "FOLATE", "FERRITIN", "TIBC", "IRON", "RETICCTPCT" in the last 72 hours. Sepsis Labs: No results for input(s): "PROCALCITON", "LATICACIDVEN" in the  last 168 hours.   Recent Results (from the past 240 hour(s))  Gram stain     Status: None   Collection Time: 11/03/22  2:30 PM   Specimen: PATH Cytology Pleural fluid  Result Value Ref Range Status   Specimen Description   Final    PLEURAL Performed at [facility] [address], [place], [place] [address]    Special Requests   Final    NONE Performed at [facility], [place] [address] [place], [place] [address]    Gram Stain   Final    CYTOSPIN SMEAR WBC PRESENT, PREDOMINANTLY PMN NO ORGANISMS SEEN Performed at [facility], [place] [address], [place], [place] [address]    Report Status 11/03/2022 FINAL  Final  Culture, blood (Routine X 2) w Reflex to ID Panel     Status: None   Collection Time: 11/04/22  9:34 AM   Specimen: BLOOD  Result Value Ref Range Status   Specimen Description   Final    BLOOD BLOOD RIGHT HAND Performed at [facility] [address], [place], [place] [address]    Special Requests   Final    BOTTLES DRAWN AEROBIC AND ANAEROBIC  Blood Culture adequate volume Performed at [facility] [address], [place], [place] [address]    Culture   Final    NO GROWTH 5 DAYS Performed at [facility], [place] [address], [place], [place] [address]    Report Status 11/09/2022 FINAL  Final  Culture, blood (Routine X 2) w Reflex to ID Panel     Status: None   Collection Time: 11/04/22  9:37 AM   Specimen: BLOOD  Result Value Ref Range Status   Specimen Description   Final    BLOOD BLOOD LEFT HAND Performed at [facility] [address], [place], [place] [address]    Special Requests   Final    BOTTLES DRAWN AEROBIC AND ANAEROBIC Blood Culture adequate volume Performed at [place] [facility] [address] [place], [place] [address]    Culture   Final    NO GROWTH 5 DAYS Performed at [facility], [place] [address], [place], [place] [address]    Report Status 11/09/2022 FINAL  Final  MRSA Next Gen by PCR, Nasal     Status: None   Collection Time: 11/09/22  8:34 PM   Specimen: Nasal Mucosa; Nasal Swab  Result Value Ref Range Status   MRSA by PCR Next Gen NOT DETECTED NOT DETECTED Final    Comment: (NOTE) The GeneXpert MRSA Assay (FDA approved for NASAL specimens only), is one component of a comprehensive MRSA colonization surveillance program. It is not intended to diagnose MRSA infection nor to guide or monitor treatment for MRSA infections. Test performance is not FDA approved in patients less than 71 years old. Performed at [facility], [place] [address], [place], [place] [address]   Culture, blood (Routine X 2) w Reflex to ID Panel     Status: None (Preliminary result)   Collection Time: 11/09/22  9:20 PM   Specimen: BLOOD RIGHT HAND  Result Value Ref Range Status   Specimen Description   Final    BLOOD RIGHT HAND Performed at [facility], [place] [address], [place], [place] [address]    Special Requests   Final    BOTTLES  DRAWN AEROBIC ONLY Blood Culture adequate volume Performed at [facility], [place] [address], [place], [place] [address]    Culture   Final    NO GROWTH 2 DAYS Performed at [facility] [address], [place], [place] [address]    Report Status PENDING  Incomplete  Culture, blood (Routine X 2) w Reflex to ID Panel     Status: None (Preliminary result)   Collection Time: 11/09/22  9:20 PM   Specimen: BLOOD LEFT HAND  Result Value Ref Range Status   Specimen Description   Final    BLOOD LEFT HAND Performed at [facility], [place] [address], [place], [place] [address]    Special Requests   Final    BOTTLES DRAWN AEROBIC ONLY Blood Culture adequate volume Performed at [facility] [address], [place], [place] [address]    Culture   Final    NO GROWTH 2 DAYS Performed at [facility] [address] [place], [place] [address]    Report Status PENDING  Incomplete         Radiology Studies: [place] EKG SITE RITE  Result Date: 11/10/2022 If Site Rite image not attached, placement could not be confirmed due to current cardiac rhythm.  DG Abd Portable 1 View  Result Date: 11/09/2022 CLINICAL DATA:  NG tube placement EXAM: PORTABLE ABDOMEN - 1 VIEW COMPARISON:  November 09, 2022, November 04, 2022 FINDINGS: Incomplete visualization of the inferior abdomen and pelvis. Enteric tube tip and side port project over the stomach. Small LEFT pleural effusion. Bibasilar atelectasis. Mildly prominent loops of bowel in the LEFT hemiabdomen. IMPRESSION: Enteric tube tip and side port project over the stomach. Electronically Signed   By: Valentino Saxon M.D.   On: 11/09/2022 17:54   CT LIVER ABDOMEN W WO CONTRAST  Result Date: 11/09/2022 CLINICAL DATA:  Recent splenic rupture with subsequent splenic artery embolization. EXAM: CT ABDOMEN WITHOUT AND WITH CONTRAST TECHNIQUE: Multidetector CT imaging of the abdomen was performed following the standard  protocol before and following the bolus administration of intravenous contrast. RADIATION DOSE REDUCTION: This exam was performed according to the departmental dose-optimization program which includes automated exposure control, adjustment of the mA and/or kV according to patient size and/or use of iterative reconstruction technique. CONTRAST:  133m OMNIPAQUE IOHEXOL 350 MG/ML SOLN COMPARISON:  10/30/2022 FINDINGS: Lower chest: There is a small left pleural effusion. Atelectasis involving the left lower lobe and right lower lobe identified. Hepatobiliary: Contour of liver is nodular. Small cyst within dome of liver. Several small subcentimeter low-density foci are identified which are unchanged and likely represent small cysts. Gallbladder appears normal. Unchanged intrahepatic and common bile duct dilatation. CBD measures 9 mm up to the head of the pancreas where changes of chronic pancreatitis are again seen. Pancreas: There are changes of chronic pancreatitis with extensive pancreatic calcifications. There are several low density fluid collections associated with the head and distal tail of pancreas. Favor pseudo cysts. The largest arises off the tail of pancreas measuring 3.6 x 1.8 cm. Previously this measured 3.7 x 2.3 cm. Interval decrease in peripancreatic inflammation, free fluid in fat stranding. Spleen: Sequelae of splenic rupture identified. Mixed attenuation perisplenic fluid collection is again noted compatible with subacute to chronic hematoma. This measures 7.0 x 8.5 by 6 10.1 cm. There are no signs of current IV contrast extravasation to suggest active hemorrhage. Adrenals/Urinary Tract: Normal adrenal glands. No hydronephrosis, nephrolithiasis or suspicious mass. 2.7 cm Bosniak class 1 cyst arises off the upper pole of the  right kidney. No follow-up imaging recommended. Urinary bladder is unremarkable. Stomach/Bowel: Stomach appears normal. There is abnormal proximal small bowel dilatation. The  small bowel loops measure up to 4 cm in diameter. Within the left iliac fossa there is a transition to decreased caliber distal small bowel loops, image 116/6. Distal colonic diverticula noted without signs of acute diverticulitis. Vascular/Lymphatic: Aortic atherosclerosis. No aneurysm. Celiac artery and superior mesenteric arteries scratch set celiac artery is patent. Embolization of the splenic artery noted. SMA and IMA are patent. The SMV is patent. There is luminal narrowing of the portal venous confluence which is favored to represent sequelae of pancreatitis, image 54/11. Extrahepatic and intrahepatic portal vein are patent. Aortic atherosclerosis without aneurysm. Enlarged left retroperitoneal lymph nodes are identified which appear increased from previous exam. Left periaortic node measures 1.2 cm, image 51/11. Previously 1 cm. No pelvic or inguinal adenopathy. Other: There is diffuse edema within the small bowel mesentery. Moderate free fluid within the within the lower abdomen and pelvis identified. This measures between 8 and 22 Hounsfield units. Previously this measured 30 Hounsfield units. Within the upper abdomen there is a large low-density this communicates with the perisplenic hematoma. This extends across the midline of the abdomen with mass effect upon the right and left lobes of liver which are displaced ventrally and rightward. The fluid collection measures 29.2 By 10.8 by 11.3 cm (volume = 1870 cm^3). Hounsfield units within this fluid collection measure 16.7. Musculoskeletal: No acute or significant osseous findings. IMPRESSION: 1. Sequelae of previous ruptured spleen identified. Mixed attenuation perisplenic fluid collection compatible with subacute to chronic hematoma. No signs of active hemorrhage. 2. There is a large low to intermediate fluid collection within the upper abdomen which communicates with the perisplenic hematoma. This fluid collection measures 29.2 x 10.8 x 11.3 cm (volume =  1870 cc). This fluid collection extends across the midline of the abdomen with mass effect upon the right and left lobes of liver which are displaced ventrally and rightward. This is favored to represent large loculated chronic hematoma versus large pseudocyst which communicates with perisplenic hematoma. 3. Resolving acute on chronic pancreatitis. 4. There is abnormal proximal small bowel dilatation with transition to decreased caliber distal small bowel loops. Findings are concerning for at least a partial small bowel obstruction. 5. Morphologic features of the liver suggestive of cirrhosis. 6. Small left pleural effusion with bilateral lower lobe atelectasis. 7. Chronic pancreatitis. There are several low density fluid collections associated with the head and distal tail of pancreas. Favor pseudocysts. 8. Enlarged left retroperitoneal lymph nodes are identified which appear increased from previous exam. These are favored to be reactive in the setting of recent pancreatitis. 9. Luminal narrowing of the portal venous confluence is favored to represent sequelae of pancreatitis. 10.  Aortic Atherosclerosis (ICD10-I70.0). Electronically Signed   By: Kerby Moors M.D.   On: 11/09/2022 16:34   DG Chest Portable 1 View  Result Date: 11/09/2022 CLINICAL DATA:  Shortness of breath.  Fall. EXAM: PORTABLE CHEST 1 VIEW COMPARISON:  November 03, 2022 chest x-ray FINDINGS: Possible small effusions with underlying atelectasis, similar in the interval. The cardiomediastinal silhouette is stable. No pneumothorax. No nodules or masses. No interval changes. IMPRESSION: Possible small effusions with underlying atelectasis. No other interval changes. Electronically Signed   By: Dorise Bullion III M.D.   On: 11/09/2022 15:20        Scheduled Meds:  Chlorhexidine Gluconate Cloth  6 each Topical Daily   sodium chloride flush  3  mL Intravenous Q12H   Continuous Infusions:  sodium chloride     HYDROmorphone 1 mg/hr  (11/11/22 0953)     LOS: 2 days    Time spent: 50 minutes    [name], MD [facility] Pager [phone]

## 2022-11-11 NOTE — Progress Notes (Signed)
Subjective/Chief Complaint: Pt with con't abd pain Noted choice of noninvasive treatments, DNR/DNI choice per pt.   Objective: Vital signs in last 24 hours: Temp:  [96.9 F (36.1 C)-98.6 F (37 C)] 98.6 F (37 C) (03/04 2100) Pulse Rate:  [71-98] 81 (03/05 0600) Resp:  [15-31] 20 (03/05 0600) BP: (74-119)/(45-84) 103/64 (03/05 0600) SpO2:  [45 %-100 %] 96 % (03/05 0600) Weight:  [58.7 kg] 58.7 kg (03/04 0825) Last BM Date : 11/10/22  Intake/Output from previous day: 03/04 0701 - 03/05 0700 In: 4455.2 [I.V.:3007.8; IV Piggyback:1447.5] Out: 100 [Urine:100] Intake/Output this shift: No intake/output data recorded.  PE:  Constitutional: No acute distress, conversant, appears states age. Eyes: Anicteric sclerae, moist conjunctiva, no lid lag Lungs: Clear to auscultation bilaterally, normal respiratory effort CV:reg rhythm, no murmurs, no peripheral edema, pedal pulses 2+ GI: firm, ttp, x4Q Skin: No rashes, palpation reveals normal turgor Psychiatric: appropriate judgment and insight, oriented to person, place, and time   Lab Results:  Recent Labs    11/09/22 2120 11/10/22 0012  WBC 22.8* 20.2*  HGB 8.9* 11.2*  HCT 27.6* 35.0*  PLT 582* 697*   BMET Recent Labs    11/10/22 0012 11/10/22 1227  NA 132* 133*  K 6.2* 5.1  CL 103 104  CO2 21* 18*  GLUCOSE 160* 157*  BUN 38* 48*  CREATININE 1.32* 2.00*  CALCIUM 7.4* 7.3*   PT/INR Recent Labs    11/09/22 1440  LABPROT 14.3  INR 1.1  Studies/Results: [place] EKG SITE RITE  Result Date: 11/10/2022 If Site Rite image not attached, placement could not be confirmed due to current cardiac rhythm.  DG Abd Portable 1 View  Result Date: 11/09/2022 CLINICAL DATA:  NG tube placement EXAM: PORTABLE ABDOMEN - 1 VIEW COMPARISON:  November 09, 2022, November 04, 2022 FINDINGS: Incomplete visualization of the inferior abdomen and pelvis. Enteric tube tip and side port project over the stomach. Small LEFT pleural effusion.  Bibasilar atelectasis. Mildly prominent loops of bowel in the LEFT hemiabdomen. IMPRESSION: Enteric tube tip and side port project over the stomach. Electronically Signed   By: Valentino Saxon M.D.   On: 11/09/2022 17:54   CT LIVER ABDOMEN W WO CONTRAST  Result Date: 11/09/2022 CLINICAL DATA:  Recent splenic rupture with subsequent splenic artery embolization. EXAM: CT ABDOMEN WITHOUT AND WITH CONTRAST TECHNIQUE: Multidetector CT imaging of the abdomen was performed following the standard protocol before and following the bolus administration of intravenous contrast. RADIATION DOSE REDUCTION: This exam was performed according to the departmental dose-optimization program which includes automated exposure control, adjustment of the mA and/or kV according to patient size and/or use of iterative reconstruction technique. CONTRAST:  11m OMNIPAQUE IOHEXOL 350 MG/ML SOLN COMPARISON:  10/30/2022 FINDINGS: Lower chest: There is a small left pleural effusion. Atelectasis involving the left lower lobe and right lower lobe identified. Hepatobiliary: Contour of liver is nodular. Small cyst within dome of liver. Several small subcentimeter low-density foci are identified which are unchanged and likely represent small cysts. Gallbladder appears normal. Unchanged intrahepatic and common bile duct dilatation. CBD measures 9 mm up to the head of the pancreas where changes of chronic pancreatitis are again seen. Pancreas: There are changes of chronic pancreatitis with extensive pancreatic calcifications. There are several low density fluid collections associated with the head and distal tail of pancreas. Favor pseudo cysts. The largest arises off the tail of pancreas measuring 3.6 x 1.8 cm. Previously this measured 3.7 x 2.3 cm. Interval decrease in peripancreatic inflammation,  free fluid in fat stranding. Spleen: Sequelae of splenic rupture identified. Mixed attenuation perisplenic fluid collection is again noted compatible  with subacute to chronic hematoma. This measures 7.0 x 8.5 by 6 10.1 cm. There are no signs of current IV contrast extravasation to suggest active hemorrhage. Adrenals/Urinary Tract: Normal adrenal glands. No hydronephrosis, nephrolithiasis or suspicious mass. 2.7 cm Bosniak class 1 cyst arises off the upper pole of the right kidney. No follow-up imaging recommended. Urinary bladder is unremarkable. Stomach/Bowel: Stomach appears normal. There is abnormal proximal small bowel dilatation. The small bowel loops measure up to 4 cm in diameter. Within the left iliac fossa there is a transition to decreased caliber distal small bowel loops, image 116/6. Distal colonic diverticula noted without signs of acute diverticulitis. Vascular/Lymphatic: Aortic atherosclerosis. No aneurysm. Celiac artery and superior mesenteric arteries scratch set celiac artery is patent. Embolization of the splenic artery noted. SMA and IMA are patent. The SMV is patent. There is luminal narrowing of the portal venous confluence which is favored to represent sequelae of pancreatitis, image 54/11. Extrahepatic and intrahepatic portal vein are patent. Aortic atherosclerosis without aneurysm. Enlarged left retroperitoneal lymph nodes are identified which appear increased from previous exam. Left periaortic node measures 1.2 cm, image 51/11. Previously 1 cm. No pelvic or inguinal adenopathy. Other: There is diffuse edema within the small bowel mesentery. Moderate free fluid within the within the lower abdomen and pelvis identified. This measures between 8 and 22 Hounsfield units. Previously this measured 30 Hounsfield units. Within the upper abdomen there is a large low-density this communicates with the perisplenic hematoma. This extends across the midline of the abdomen with mass effect upon the right and left lobes of liver which are displaced ventrally and rightward. The fluid collection measures 29.2 By 10.8 by 11.3 cm (volume = 1870 cm^3).  Hounsfield units within this fluid collection measure 16.7. Musculoskeletal: No acute or significant osseous findings. IMPRESSION: 1. Sequelae of previous ruptured spleen identified. Mixed attenuation perisplenic fluid collection compatible with subacute to chronic hematoma. No signs of active hemorrhage. 2. There is a large low to intermediate fluid collection within the upper abdomen which communicates with the perisplenic hematoma. This fluid collection measures 29.2 x 10.8 x 11.3 cm (volume = 1870 cc). This fluid collection extends across the midline of the abdomen with mass effect upon the right and left lobes of liver which are displaced ventrally and rightward. This is favored to represent large loculated chronic hematoma versus large pseudocyst which communicates with perisplenic hematoma. 3. Resolving acute on chronic pancreatitis. 4. There is abnormal proximal small bowel dilatation with transition to decreased caliber distal small bowel loops. Findings are concerning for at least a partial small bowel obstruction. 5. Morphologic features of the liver suggestive of cirrhosis. 6. Small left pleural effusion with bilateral lower lobe atelectasis. 7. Chronic pancreatitis. There are several low density fluid collections associated with the head and distal tail of pancreas. Favor pseudocysts. 8. Enlarged left retroperitoneal lymph nodes are identified which appear increased from previous exam. These are favored to be reactive in the setting of recent pancreatitis. 9. Luminal narrowing of the portal venous confluence is favored to represent sequelae of pancreatitis. 10.  Aortic Atherosclerosis (ICD10-I70.0). Electronically Signed   By: Kerby Moors M.D.   On: 11/09/2022 16:34   DG Chest Portable 1 View  Result Date: 11/09/2022 CLINICAL DATA:  Shortness of breath.  Fall. EXAM: PORTABLE CHEST 1 VIEW COMPARISON:  November 03, 2022 chest x-ray FINDINGS: Possible small effusions with  underlying atelectasis,  similar in the interval. The cardiomediastinal silhouette is stable. No pneumothorax. No nodules or masses. No interval changes. IMPRESSION: Possible small effusions with underlying atelectasis. No other interval changes. Electronically Signed   By: Dorise Bullion III M.D.   On: 11/09/2022 15:20    Anti-infectives: Anti-infectives (From admission, onward)    Start     Dose/Rate Route Frequency Ordered Stop   11/10/22 2000  vancomycin (VANCOCIN) IVPB 1000 mg/200 mL premix  Status:  Discontinued        1,000 mg 200 mL/hr over 60 Minutes Intravenous Every 24 hours 11/09/22 1932 11/10/22 1221   11/10/22 0200  piperacillin-tazobactam (ZOSYN) IVPB 3.375 g        3.375 g 12.5 mL/hr over 240 Minutes Intravenous Every 8 hours 11/09/22 1929     11/09/22 2145  piperacillin-tazo (ZOSYN) NICU IV syringe 225 mg/mL  Status:  Discontinued        100 mg/kg of piperacillin  58.7 kg 58.8 mL/hr over 30 Minutes Intravenous Every 8 hours 11/09/22 2058 11/09/22 2109   11/09/22 1915  piperacillin-tazobactam (ZOSYN) IVPB 3.375 g        3.375 g 100 mL/hr over 30 Minutes Intravenous NOW 11/09/22 1906 11/09/22 1950   11/09/22 1915  vancomycin (VANCOREADY) IVPB 1250 mg/250 mL        1,250 mg 166.7 mL/hr over 90 Minutes Intravenous NOW 11/09/22 1906 11/10/22 0340       Assessment/Plan: 56 M with likely large panc pseudocyst, sever pancreatitis, SIRS -appears pt has decided to not escalate care at this point, which appears appropriate -Pt at this point would not be a great surgical candidate, with little to offer from a surgery standpoint. -would con't supportive care. -will be available if needed.  Straight-forward decision making  LOS: 2 days    Ralene Ok 11/11/2022

## 2022-11-11 NOTE — Progress Notes (Signed)
Nutrition Brief Note  Chart reviewed. Pt now transitioning to comfort care.  No further nutrition interventions planned at this time.  Please re-consult as needed.     Sary Bogie RD, LDN For contact information, refer to AMiON.   

## 2022-11-11 NOTE — TOC Progression Note (Signed)
Transition of Care [facility]) - Progression Note    Patient Details  Name: Jared Duran MRN: [id number] Date of Birth: 1950/10/05  [facility] CM/SW Contact  Servando Snare, [place] Phone Number: 11/11/2022, 11:32 AM  Clinical Narrative:    Per request of attending, [facility].          Expected Discharge Plan and Services                                               Social Determinants of Health (SDOH) Interventions SDOH Screenings   Food Insecurity: No Food Insecurity (11/10/2022)  Housing: Low Risk  (11/10/2022)  Transportation Needs: No Transportation Needs (11/10/2022)  Utilities: Not At Risk (11/10/2022)  Tobacco Use: High Risk (11/10/2022)    Readmission Risk Interventions    11/03/2022    9:23 AM 01/07/2022    9:55 AM  Readmission Risk Prevention Plan  Transportation Screening Complete Complete  PCP or Specialist Appt within 5-7 Days Complete   Home Care Screening Complete Complete  Medication Review (RN CM) Complete Complete

## 2022-11-11 NOTE — Progress Notes (Signed)
Chaplain provided follow up care to family and met with another of Jared Duran's daughters and her daughter.  They had just learned today of his condition and were in shock.  Chaplain provided emotional and grief support.  [address] [place] Pager, [phone]

## 2022-11-12 DIAGNOSIS — Z515 Encounter for palliative care: Secondary | ICD-10-CM

## 2022-11-12 DIAGNOSIS — A419 Sepsis, unspecified organism: Secondary | ICD-10-CM | POA: Diagnosis not present

## 2022-11-12 NOTE — Progress Notes (Signed)
   Pt has been approved by our MD for our Hospice facility in [facility]. Unfortunately we currently are unable to offer a bed due to capacity. Once something becomes avaialble I will reach out to proceed with d/c plan.   I have spoken to the pt's sister Lorre Nick and she confirms interest in the facility and understands the comfort care approach which has already been started In the hospital.   Webb Silversmith RN [phone]

## 2022-11-12 NOTE — Progress Notes (Signed)
PROGRESS NOTE    Jared Duran  [id number] DOB: 03-12-1951 DOA: 11/09/2022 PCP: [name], MD   Brief Narrative:  72 year old gentleman with history of chronic recurrent pancreatitis, requiring multiple hospitalizations with most recent admission from 10/30/2022-11/06/2022 for spontaneous splenic hemorrhage complicated by leukocytosis and left-sided pleural effusion status post splenic artery arteriogram and embolization, blood transfusions and left-sided thoracentesis presented with worsening abdominal pain, nausea, vomiting and diarrhea with night sweats and fevers and extreme weakness.  On presentation, he had significant abdominal distention, hypoxemia with oxygen saturation of 86% on room air and was placed on 6 L of oxygen.  In the ED, he was hypotensive with leukocytosis and CT scan showing large perisplenic hematoma 30 cm in size.  NG tube was placed.  General surgery was consulted and patient was started on IV antibiotics.  He developed rapid A-fib requiring amiodarone.  Because of overall very poor prognosis, after discussion with general surgery, patient decided to not pursue any surgical treatment and wanted to pursue comfort measures.  Comfort measures started on 11/11/2022.  Assessment & Plan:   Comfort measures only status Severe sepsis versus hypovolemic shock: Present on admission secondary to intra-abdominal hematoma Ileus in the setting of pancreatitis and pseudocyst Severe/intractable abdominal pain Very poor oral intake Failure to thrive Severe protein calorie malnutrition Paroxysmal new onset A-fib with RVR AKI on CKD stage II, now anuric Hyponatremia Hyperkalemia Leukocytosis Normocytic anemia Thrombocytosis  Plan -As discussed above, he was switched to comfort measures only status on 11/11/2022 and has been started on Dilaudid drip.  TOC following for possible residential hospice placement.   DVT prophylaxis: None for comfort measures Code Status:  DNR Family Communication: None at bedside Disposition Plan: Status is: Inpatient Remains inpatient appropriate because: Of severity of illness.  Consultants: General surgery  Procedures: None  Antimicrobials:  Anti-infectives (From admission, onward)    Start     Dose/Rate Route Frequency Ordered Stop   11/10/22 2000  vancomycin (VANCOCIN) IVPB 1000 mg/200 mL premix  Status:  Discontinued        1,000 mg 200 mL/hr over 60 Minutes Intravenous Every 24 hours 11/09/22 1932 11/10/22 1221   11/10/22 0200  piperacillin-tazobactam (ZOSYN) IVPB 3.375 g  Status:  Discontinued        3.375 g 12.5 mL/hr over 240 Minutes Intravenous Every 8 hours 11/09/22 1929 11/11/22 0845   11/09/22 2145  piperacillin-tazo (ZOSYN) NICU IV syringe 225 mg/mL  Status:  Discontinued        100 mg/kg of piperacillin  58.7 kg 58.8 mL/hr over 30 Minutes Intravenous Every 8 hours 11/09/22 2058 11/09/22 2109   11/09/22 1915  piperacillin-tazobactam (ZOSYN) IVPB 3.375 g        3.375 g 100 mL/hr over 30 Minutes Intravenous NOW 11/09/22 1906 11/09/22 1950   11/09/22 1915  vancomycin (VANCOREADY) IVPB 1250 mg/250 mL        1,250 mg 166.7 mL/hr over 90 Minutes Intravenous NOW 11/09/22 1906 11/10/22 0340        Subjective: Patient seen and examined at bedside.  Almost unresponsive.  In no distress. Objective: Vitals:   11/11/22 1000 11/11/22 1100 11/11/22 1200 11/11/22 1257  BP:    93/63  Pulse: 79 80 78 78  Resp: 20 14 (!) 22 13  Temp:      TempSrc:      SpO2: 98% 98% 99% 97%  Weight:      Height:        Intake/Output Summary (Last 24 hours) at 11/12/2022  [place] filed at 11/12/2022 0500 Gross per 24 hour  Intake 82.84 ml  Output 320 ml  Net -237.16 ml   Filed Weights   11/10/22 0825  Weight: 58.7 kg    Examination:  General exam: Looks chronically ill and deconditioned.  Almost unresponsive.  No distress.  Data Reviewed: I have personally reviewed following labs and imaging  studies  CBC: Recent Labs  Lab 11/06/22 0648 11/09/22 1440 11/09/22 1510 11/09/22 2120 11/10/22 0012  WBC 24.8* 28.8*  --  22.8* 20.2*  NEUTROABS 20.8* 24.5*  --   --   --   HGB 8.8* 9.3* 10.5* 8.9* 11.2*  HCT 27.7* 28.1* 31.0* 27.6* 35.0*  MCV 97.9 94.9  --  95.8 96.2  PLT 353 533*  --  582* [id number]*   Basic Metabolic Panel: Recent Labs  Lab 11/09/22 1440 11/09/22 1510 11/09/22 2120 11/10/22 0012 11/10/22 1227  NA 134* 135  --  132* 133*  K 4.3 4.4  --  6.2* 5.1  CL 102 101  --  103 104  CO2 24  --   --  21* 18*  GLUCOSE 136* 132*  --  160* 157*  BUN 35* 34*  --  38* 48*  CREATININE 1.25* 1.20 1.29* 1.32* 2.00*  CALCIUM 7.8*  --   --  7.4* 7.3*  MG  --  2.6*  --   --  2.3  PHOS  --  4.9*  --   --  6.1*   GFR: Estimated Creatinine Clearance: 28.9 mL/min (A) (by C-G formula based on SCr of 2 mg/dL (H)). Liver Function Tests: Recent Labs  Lab 11/09/22 1440 11/10/22 0012  AST 36 44*  ALT 17 15  ALKPHOS 125 113  BILITOT 2.2* 1.9*  PROT 7.1 6.0*  ALBUMIN 1.9* 1.6*   Recent Labs  Lab 11/09/22 1440 11/10/22 0012  LIPASE 164* 303*   No results for input(s): "AMMONIA" in the last 168 hours. Coagulation Profile: Recent Labs  Lab 11/09/22 1440  INR 1.1   Cardiac Enzymes: No results for input(s): "CKTOTAL", "CKMB", "CKMBINDEX", "TROPONINI" in the last 168 hours. BNP (last 3 results) No results for input(s): "PROBNP" in the last 8760 hours. HbA1C: Recent Labs    11/10/22 0012  HGBA1C 4.9   CBG: Recent Labs  Lab 11/10/22 0148 11/10/22 1719  GLUCAP 142* 133*   Lipid Profile: No results for input(s): "CHOL", "HDL", "LDLCALC", "TRIG", "CHOLHDL", "LDLDIRECT" in the last 72 hours. Thyroid Function Tests: No results for input(s): "TSH", "T4TOTAL", "FREET4", "T3FREE", "THYROIDAB" in the last 72 hours. Anemia Panel: No results for input(s): "VITAMINB12", "FOLATE", "FERRITIN", "TIBC", "IRON", "RETICCTPCT" in the last 72 hours. Sepsis Labs: No results for  input(s): "PROCALCITON", "LATICACIDVEN" in the last 168 hours.  Recent Results (from the past 240 hour(s))  Gram stain     Status: None   Collection Time: 11/03/22  2:30 PM   Specimen: PATH Cytology Pleural fluid  Result Value Ref Range Status   Specimen Description   Final    PLEURAL Performed at [place] [address] [place], [place] [address]    Special Requests   Final    NONE Performed at [facility], [place] [address], [place], [place] [address]    Gram Stain   Final    CYTOSPIN SMEAR WBC PRESENT, PREDOMINANTLY PMN NO ORGANISMS SEEN Performed at [facility], [place] [address] [place], [place] [address]    Report Status 11/03/2022 FINAL  Final  Culture, blood (Routine X 2) w Reflex to ID Panel     Status: None   Collection Time: 11/04/22  9:34 AM   Specimen: BLOOD  Result Value Ref Range Status   Specimen Description   Final    BLOOD BLOOD RIGHT HAND Performed at [place] [address] [place], [place] [address]    Special Requests   Final    BOTTLES DRAWN AEROBIC AND ANAEROBIC Blood Culture adequate volume Performed at [place] [address] [place], [place] [address]    Culture   Final    NO GROWTH 5 DAYS Performed at [facility], [place] [address], [place], [place] [address]    Report Status 11/09/2022 FINAL  Final  Culture, blood (Routine X 2) w Reflex to ID Panel     Status: None   Collection Time: 11/04/22  9:37 AM   Specimen: BLOOD  Result Value Ref Range Status   Specimen Description   Final    BLOOD BLOOD LEFT HAND Performed at [facility] [place] [address] [place], [place] [address]    Special Requests   Final    BOTTLES DRAWN AEROBIC AND ANAEROBIC Blood Culture adequate volume Performed at [place] [address] [place], [place] [address]    Culture   Final    NO GROWTH 5 DAYS Performed at [facility], [place] [address] [place], [place] [address]    Report Status 11/09/2022 FINAL  Final  MRSA Next Gen by PCR, Nasal     Status: None   Collection Time: 11/09/22  8:34 PM   Specimen: Nasal Mucosa; Nasal Swab  Result Value Ref Range Status   MRSA by PCR Next Gen NOT DETECTED NOT DETECTED Final    Comment: (NOTE) The GeneXpert MRSA Assay (FDA approved for NASAL specimens only), is one component of a comprehensive MRSA colonization surveillance program. It is not intended to diagnose MRSA infection nor to guide or monitor treatment for MRSA infections. Test performance is not FDA approved in patients less than 71 years old. Performed at [facility], [place] [address] [place], [place] [address]   Culture, blood (Routine X 2) w Reflex to ID Panel     Status: None (Preliminary result)   Collection Time: 11/09/22  9:20 PM   Specimen: BLOOD RIGHT HAND  Result Value Ref Range Status   Specimen Description   Final    BLOOD RIGHT HAND Performed at [facility], [place] [address], [place], [place] [address]    Special Requests   Final    BOTTLES DRAWN AEROBIC ONLY Blood Culture adequate volume Performed at [place] [address], [place], [place] [address]    Culture   Final    NO GROWTH 3 DAYS Performed at [facility], [place] [address], [place],  [address]    Report Status PENDING  Incomplete  Culture, blood (Routine X 2) w Reflex to ID Panel     Status: None (Preliminary result)   Collection Time: 11/09/22  9:20 PM   Specimen: BLOOD LEFT HAND  Result Value Ref Range Status   Specimen Description   Final    BLOOD LEFT HAND Performed at [facility], [place] [address] [place],  [address]    Special Requests   Final    BOTTLES DRAWN AEROBIC ONLY Blood Culture adequate volume Performed at [facility], [address] [place], [place] [address]    Culture   Final    NO GROWTH 3  DAYS Performed at [facility], [place] [address], [place], [place] [address]    Report Status PENDING  Incomplete         Radiology Studies: No results found.      Scheduled Meds:  Chlorhexidine Gluconate Cloth  6 each Topical Daily   sodium chloride flush  3 mL Intravenous Q12H   Continuous Infusions:  sodium chloride     HYDROmorphone 1 mg/hr (11/12/22 0500)          Aline August, MD [facility] 11/12/2022, 9:22 AM

## 2022-11-12 NOTE — TOC Progression Note (Signed)
Transition of Care [facility]) - Progression Note   Patient Details  Name: Jared Duran MRN: [id number] Date of Birth: 1951/05/07  [facility] CM/SW [place], LCSW Phone Number: 11/12/2022, 11:41 AM  Clinical Narrative: CSW followed up with Cheri with [facility]. Patient is first on the waitlist for residential hospice. CSW updated hospitalist. TOC awaiting bed availability.  Expected Discharge Plan: [place] Barriers to Discharge: Hospice Bed not available  Social Determinants of Health (SDOH) Interventions SDOH Screenings   Food Insecurity: No Food Insecurity (11/10/2022)  Housing: Low Risk  (11/10/2022)  Transportation Needs: No Transportation Needs (11/10/2022)  Utilities: Not At Risk (11/10/2022)  Tobacco Use: High Risk (11/10/2022)   Readmission Risk Interventions    11/03/2022    9:23 AM 01/07/2022    9:55 AM  Readmission Risk Prevention Plan  Transportation Screening Complete Complete  PCP or Specialist Appt within 5-7 Days Complete   Home Care Screening Complete Complete  Medication Review (RN CM) Complete Complete

## 2022-11-13 NOTE — Progress Notes (Signed)
Wasted 40 ml of Dilaudid 1 mg with Jackpot

## 2022-11-13 NOTE — Discharge Summary (Signed)
Physician Discharge Summary  Jared Duran [id number] DOB: Dec 27, 1950 DOA: 11/09/2022  PCP: [name], MD  Admit date: 11/09/2022 Discharge date: 11/13/2022  Admitted From: Home Disposition: Residential hospice  Recommendations for Outpatient Follow-up:  Follow up with residential hospice at earliest [place]: No Equipment/Devices: None  Discharge Condition: Poor CODE STATUS: DNR Diet recommendation: As per comfort measures  Brief/Interim Summary: 72 year old gentleman with history of chronic recurrent pancreatitis, requiring multiple hospitalizations with most recent admission from 10/30/2022-11/06/2022 for spontaneous splenic hemorrhage complicated by leukocytosis and left-sided pleural effusion status post splenic artery arteriogram and embolization, blood transfusions and left-sided thoracentesis presented with worsening abdominal pain, nausea, vomiting and diarrhea with night sweats and fevers and extreme weakness. On presentation, he had significant abdominal distention, hypoxemia with oxygen saturation of 86% on room air and was placed on 6 L of oxygen. In the ED, he was hypotensive with leukocytosis and CT scan showing large perisplenic hematoma 30 cm in size. NG tube was placed. General surgery was consulted and patient was started on IV antibiotics. He developed rapid A-fib requiring amiodarone. Because of overall very poor prognosis, after discussion with general surgery, patient decided to not pursue any surgical treatment and wanted to pursue comfort measures. Comfort measures started on 11/11/2022.   Discharge Diagnoses:   Comfort measures only status Severe sepsis versus hypovolemic shock: Present on admission secondary to intra-abdominal hematoma Ileus in the setting of pancreatitis and pseudocyst Severe/intractable abdominal pain Very poor oral intake Failure to thrive Severe protein calorie malnutrition Paroxysmal new onset A-fib with RVR AKI on CKD  stage II, now anuric Hyponatremia Hyperkalemia Leukocytosis Normocytic anemia Thrombocytosis   Plan -As discussed above, he was switched to comfort measures only status on 11/11/2022 and has been started on Dilaudid drip.  TOC following for possible residential hospice placement.     Discharge Instructions   Allergies as of 11/13/2022       Reactions   Ethanol Other (See Comments)   Severe pancreatitis & liver disease = NO ALCOHOL        Medication List     STOP taking these medications    feeding supplement Liqd   hydrOXYzine 25 MG tablet Commonly known as: ATARAX   losartan 100 MG tablet Commonly known as: COZAAR   NIFEdipine 90 MG 24 hr tablet Commonly known as: ADALAT CC   oxyCODONE 5 MG immediate release tablet Commonly known as: Roxicodone   pantoprazole 20 MG tablet Commonly known as: PROTONIX   polycarbophil 625 MG tablet Commonly known as: FIBERCON   polyethylene glycol 17 g packet Commonly known as: MIRALAX / GLYCOLAX   sertraline 25 MG tablet Commonly known as: ZOLOFT        Allergies  Allergen Reactions   Ethanol Other (See Comments)    Severe pancreatitis & liver disease = NO ALCOHOL    Consultations: General surgery    Procedures/Studies: [place] EKG SITE RITE  Result Date: 11/10/2022 If Site Rite image not attached, placement could not be confirmed due to current cardiac rhythm.  DG Abd Portable 1 View  Result Date: 11/09/2022 CLINICAL DATA:  NG tube placement EXAM: PORTABLE ABDOMEN - 1 VIEW COMPARISON:  November 09, 2022, November 04, 2022 FINDINGS: Incomplete visualization of the inferior abdomen and pelvis. Enteric tube tip and side port project over the stomach. Small LEFT pleural effusion. Bibasilar atelectasis. Mildly prominent loops of bowel in the LEFT hemiabdomen. IMPRESSION: Enteric tube tip and side port project over the stomach. Electronically Signed   By:  Valentino Saxon M.D.   On: 11/09/2022 17:54   CT LIVER ABDOMEN W WO  CONTRAST  Result Date: 11/09/2022 CLINICAL DATA:  Recent splenic rupture with subsequent splenic artery embolization. EXAM: CT ABDOMEN WITHOUT AND WITH CONTRAST TECHNIQUE: Multidetector CT imaging of the abdomen was performed following the standard protocol before and following the bolus administration of intravenous contrast. RADIATION DOSE REDUCTION: This exam was performed according to the departmental dose-optimization program which includes automated exposure control, adjustment of the mA and/or kV according to patient size and/or use of iterative reconstruction technique. CONTRAST:  148m OMNIPAQUE IOHEXOL 350 MG/ML SOLN COMPARISON:  10/30/2022 FINDINGS: Lower chest: There is a small left pleural effusion. Atelectasis involving the left lower lobe and right lower lobe identified. Hepatobiliary: Contour of liver is nodular. Small cyst within dome of liver. Several small subcentimeter low-density foci are identified which are unchanged and likely represent small cysts. Gallbladder appears normal. Unchanged intrahepatic and common bile duct dilatation. CBD measures 9 mm up to the head of the pancreas where changes of chronic pancreatitis are again seen. Pancreas: There are changes of chronic pancreatitis with extensive pancreatic calcifications. There are several low density fluid collections associated with the head and distal tail of pancreas. Favor pseudo cysts. The largest arises off the tail of pancreas measuring 3.6 x 1.8 cm. Previously this measured 3.7 x 2.3 cm. Interval decrease in peripancreatic inflammation, free fluid in fat stranding. Spleen: Sequelae of splenic rupture identified. Mixed attenuation perisplenic fluid collection is again noted compatible with subacute to chronic hematoma. This measures 7.0 x 8.5 by 6 10.1 cm. There are no signs of current IV contrast extravasation to suggest active hemorrhage. Adrenals/Urinary Tract: Normal adrenal glands. No hydronephrosis, nephrolithiasis or  suspicious mass. 2.7 cm Bosniak class 1 cyst arises off the upper pole of the right kidney. No follow-up imaging recommended. Urinary bladder is unremarkable. Stomach/Bowel: Stomach appears normal. There is abnormal proximal small bowel dilatation. The small bowel loops measure up to 4 cm in diameter. Within the left iliac fossa there is a transition to decreased caliber distal small bowel loops, image 116/6. Distal colonic diverticula noted without signs of acute diverticulitis. Vascular/Lymphatic: Aortic atherosclerosis. No aneurysm. Celiac artery and superior mesenteric arteries scratch set celiac artery is patent. Embolization of the splenic artery noted. SMA and IMA are patent. The SMV is patent. There is luminal narrowing of the portal venous confluence which is favored to represent sequelae of pancreatitis, image 54/11. Extrahepatic and intrahepatic portal vein are patent. Aortic atherosclerosis without aneurysm. Enlarged left retroperitoneal lymph nodes are identified which appear increased from previous exam. Left periaortic node measures 1.2 cm, image 51/11. Previously 1 cm. No pelvic or inguinal adenopathy. Other: There is diffuse edema within the small bowel mesentery. Moderate free fluid within the within the lower abdomen and pelvis identified. This measures between 8 and 22 Hounsfield units. Previously this measured 30 Hounsfield units. Within the upper abdomen there is a large low-density this communicates with the perisplenic hematoma. This extends across the midline of the abdomen with mass effect upon the right and left lobes of liver which are displaced ventrally and rightward. The fluid collection measures 29.2 By 10.8 by 11.3 cm (volume = 1870 cm^3). Hounsfield units within this fluid collection measure 16.7. Musculoskeletal: No acute or significant osseous findings. IMPRESSION: 1. Sequelae of previous ruptured spleen identified. Mixed attenuation perisplenic fluid collection compatible with  subacute to chronic hematoma. No signs of active hemorrhage. 2. There is a large low to intermediate fluid  collection within the upper abdomen which communicates with the perisplenic hematoma. This fluid collection measures 29.2 x 10.8 x 11.3 cm (volume = 1870 cc). This fluid collection extends across the midline of the abdomen with mass effect upon the right and left lobes of liver which are displaced ventrally and rightward. This is favored to represent large loculated chronic hematoma versus large pseudocyst which communicates with perisplenic hematoma. 3. Resolving acute on chronic pancreatitis. 4. There is abnormal proximal small bowel dilatation with transition to decreased caliber distal small bowel loops. Findings are concerning for at least a partial small bowel obstruction. 5. Morphologic features of the liver suggestive of cirrhosis. 6. Small left pleural effusion with bilateral lower lobe atelectasis. 7. Chronic pancreatitis. There are several low density fluid collections associated with the head and distal tail of pancreas. Favor pseudocysts. 8. Enlarged left retroperitoneal lymph nodes are identified which appear increased from previous exam. These are favored to be reactive in the setting of recent pancreatitis. 9. Luminal narrowing of the portal venous confluence is favored to represent sequelae of pancreatitis. 10.  Aortic Atherosclerosis (ICD10-I70.0). Electronically Signed   By: Kerby Moors M.D.   On: 11/09/2022 16:34   DG Chest Portable 1 View  Result Date: 11/09/2022 CLINICAL DATA:  Shortness of breath.  Fall. EXAM: PORTABLE CHEST 1 VIEW COMPARISON:  November 03, 2022 chest x-ray FINDINGS: Possible small effusions with underlying atelectasis, similar in the interval. The cardiomediastinal silhouette is stable. No pneumothorax. No nodules or masses. No interval changes. IMPRESSION: Possible small effusions with underlying atelectasis. No other interval changes. Electronically Signed   By:  Dorise Bullion III M.D.   On: 11/09/2022 15:20   DG Abd Portable 1V  Result Date: 11/04/2022 CLINICAL DATA:  Abdominal pain. EXAM: PORTABLE ABDOMEN - 1 VIEW COMPARISON:  Radiographs 10/14/2018.  CT 11/01/2022. FINDINGS: 0756 hours. Single supine view of the abdomen excludes the upper abdomen. The bowel gas pattern is nonobstructive. There is residual contrast material within the colon near the splenic flexure. A small amount of contrast is present in the urinary bladder. No suspicious abdominal calcifications. The bones appear unchanged. IMPRESSION: No radiographic evidence of acute abdominal process. See recent CT report. Electronically Signed   By: Richardean Sale M.D.   On: 11/04/2022 08:08   US THORACENTESIS ASP PLEURAL SPACE W/IMG GUIDE  Result Date: 11/03/2022 INDICATION: Patient with history of chronic pancreatitis, recent splenic hemorrhage /embolization of splenic artery, left pleural effusion; request received for diagnostic and therapeutic left thoracentesis EXAM: ULTRASOUND GUIDED DIAGNOSTIC AND THERAPEUTIC LEFT THORACENTESIS MEDICATIONS: 8 ml 1% lidocaine COMPLICATIONS: None immediate. PROCEDURE: An ultrasound guided thoracentesis was thoroughly discussed with the patient and questions answered. The benefits, risks, alternatives and complications were also discussed. The patient understands and wishes to proceed with the procedure. Written consent was obtained. Ultrasound was performed to localize and mark an adequate pocket of fluid in the LEFT chest. The area was then prepped and draped in the normal sterile fashion. 1% Lidocaine was used for local anesthesia. Under ultrasound guidance a 6 Fr Safe-T-Centesis catheter was introduced. Thoracentesis was performed. The catheter was removed and a dressing applied. FINDINGS: A total of approximately 420 mL of hazy, yellow fluid was removed. Samples were sent to the laboratory as requested by the clinical team. IMPRESSION: Successful ultrasound  guided diagnostic and therapeutic LEFT thoracentesis yielding 420 mL of pleural fluid. Read by: Rowe Robert, PA-C Electronically Signed   By: Michaelle Birks M.D.   On: 11/03/2022 15:26   DG  CHEST PORT 1 VIEW  Result Date: 11/03/2022 CLINICAL DATA:  Thoracentesis EXAM: PORTABLE CHEST 1 VIEW COMPARISON:  Chest radiograph 1 day prior FINDINGS: The cardiomediastinal silhouette is stable. The left pleural effusion has decreased in size following thoracentesis with improved aeration of the left lung base. There is no appreciable pneumothorax The right lung is clear. There is no right pleural effusion or pneumothorax There is no acute osseous abnormality. IMPRESSION: Decreased left pleural effusion with improved aeration of the left lung following thoracentesis. No appreciable pneumothorax. Electronically Signed   By: Valetta Mole M.D.   On: 11/03/2022 14:56   DG CHEST PORT 1 VIEW  Result Date: 11/02/2022 CLINICAL DATA:  Shortness of breath. Congestive cough. EXAM: PORTABLE CHEST 1 VIEW COMPARISON:  Radiograph 10/30/2022, chest CT 10/19/2022. Lung bases from abdominal CT earlier today FINDINGS: Increasing left pleural effusion with hazy opacity overlying the left lower lung zone. Grossly stable heart size and mediastinal contours. Slight increase in right infrahilar atelectasis. No pneumothorax. No pulmonary edema. IMPRESSION: Increasing left pleural effusion with hazy opacity overlying the left lower lung zone. Increasing right infrahilar atelectasis. Electronically Signed   By: Keith Rake M.D.   On: 11/02/2022 15:19   CT ABDOMEN PELVIS W CONTRAST  Result Date: 11/01/2022 CLINICAL DATA:  Worsening abdominal pain. Splenic hemorrhage. Status post splenic artery embolization. Pancreatitis. EXAM: CT ABDOMEN AND PELVIS WITH CONTRAST TECHNIQUE: Multidetector CT imaging of the abdomen and pelvis was performed using the standard protocol following bolus administration of intravenous contrast. RADIATION DOSE  REDUCTION: This exam was performed according to the departmental dose-optimization program which includes automated exposure control, adjustment of the mA and/or kV according to patient size and/or use of iterative reconstruction technique. CONTRAST:  136m OMNIPAQUE IOHEXOL 300 MG/ML  SOLN COMPARISON:  10/30/2022 FINDINGS: Lower Chest: Increased moderate left pleural effusion and left lower lobe atelectasis. New tiny right pleural effusion. Hepatobiliary: Few small hepatic cysts remains stable. No hepatic masses identified. 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 High attenuation gallbladder sludge again noted, however there is no evidence of acute cholecystitis. Pancreas: No pancreatic head mass identified. Diffuse pancreatic calcification is again seen, consistent with chronic pancreatitis. A 3.6 cm complex cystic lesion is seen adjacent to the pancreatic tail, which may represent pancreatic pseudocyst or hematoma. 1.0 cm fluid collection adjacent to the pancreatic neck is consistent with a small pseudocyst. Spleen: Complex splenic laceration and perisplenic hematoma are stable since prior study. Stable mild-to-moderate hemoperitoneum. Adrenals/Urinary Tract: No suspicious masses identified. No evidence of ureteral calculi or hydronephrosis. Stomach/Bowel: No evidence of obstruction, inflammatory process or abnormal fluid collections. Normal appendix visualized. Diverticulosis is seen mainly involving the descending and sigmoid colon, however there is no evidence of diverticulitis. Vascular/Lymphatic: No pathologically enlarged lymph nodes. No acute vascular findings. Aortic atherosclerotic calcification incidentally noted. Reproductive:  No mass or other significant abnormality. Other:  Increased diffuse mesenteric and body wall edema. Musculoskeletal:  No suspicious bone lesions identified. IMPRESSION: Stable complex splenic laceration and  perisplenic hematoma. Stable mild-to-moderate hemoperitoneum. Stable 3.6 cm complex cystic lesion adjacent to the pancreatic tail, which may represent pancreatic pseudocyst or hematoma. Chronic calcific pancreatitis. 1 cm cystic lesion adjacent to the pancreatic neck, consistent with small pseudocyst. 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 Increased diffuse mesenteric and body wall edema. Increased moderate left pleural effusion and left lower lobe atelectasis. New tiny right pleural effusion. Colonic diverticulosis, without radiographic evidence  of diverticulitis. Aortic Atherosclerosis (ICD10-I70.0). Electronically Signed   By: Marlaine Hind M.D.   On: 11/01/2022 15:04   IR US Guide Vasc Access Right  Result Date: 10/31/2022 INDICATION: Pancreatitis and spontaneous splenic hemorrhage with parenchymal subcapsular hematoma. Hemoglobin has not responded appropriately to blood transfusion and the patient now presents for splenic artery embolization. EXAM: 1. ULTRASOUND GUIDANCE FOR VASCULAR ACCESS OF THE RIGHT COMMON FEMORAL ARTERY 2. VISCERAL SELECTIVE ARTERIOGRAPHY OF THE SPLENIC ARTERY 3. ADDITIONAL SELECTIVE ARTERIOGRAPHY OF THE DISTAL SPLENIC ARTERY 4. TRANSCATHETER EMBOLIZATION OF THE SPLENIC ARTERY TO TREAT ARTERIAL HEMORRHAGE MEDICATIONS: 2 g IV Ancef. The antibiotic was administered within 1 hour of the procedure ANESTHESIA/SEDATION: Moderate (conscious) sedation was employed during this procedure. A total of Versed 2.0 mg and Fentanyl 100 mcg was administered intravenously. Moderate Sedation Time: 55 minutes. The patient's level of consciousness and vital signs were monitored continuously by radiology nursing throughout the procedure under my direct supervision. CONTRAST:  25 mL Visipaque 320 FLUOROSCOPY TIME:  Radiation Exposure Index (as provided by the fluoroscopic device): [id number] mGy Kerma COMPLICATIONS: None immediate. PROCEDURE:  Informed consent was obtained from the patient following explanation of the procedure, risks, benefits and alternatives. The patient understands, agrees and consents for the procedure. All questions were addressed. A time out was performed prior to the initiation of the procedure. Maximal barrier sterile technique utilized including caps, mask, sterile gowns, sterile gloves, large sterile drape, hand hygiene, and chlorhexidine prep. During the procedure local anesthesia was provided with 1% lidocaine. Ultrasound was used to confirm patency of the right common femoral artery. A permanent ultrasound image was saved and recorded. The right common femoral artery was accessed utilizing a micropuncture set. A 5 French sheath was placed. A 5 French Cobra catheter was advanced into the abdominal aorta. The catheter was used to selectively catheterize the celiac axis. The catheter was then used to selectively catheterize the proximal splenic artery. Selective splenic arteriography was performed. A microcatheter was advanced through the 5 French catheter and further advanced into the distal aspect of the splenic artery. Additional selective splenic arteriography was performed. Embolization was performed through the microcatheter with initial deployment of a Medtronic MVP 5 vascular plug. Additional arteriography was performed through the microcatheter as well as periodic injection of contrast material under fluoroscopy to assess flow in the splenic artery. The microcatheter was removed. The 5 French catheter was then further advanced into the splenic artery. Additional arteriography was performed. An MVP 7 vascular plug was then deployed through the 5 [place] catheter. Additional arteriography was then performed through the 5 French catheter. The catheter was then removed. Hemostasis was obtained at the sheath access site with the Angio-Seal device. FINDINGS: Initial splenic arteriography demonstrates a tortuous splenic artery  supplying an enlarged spleen. Intra-splenic branches are beaded and tortuous especially in the lower pole of the spleen. There is some displacement of vessels in the central aspect of the splenic parenchyma likely related to intraparenchymal hemorrhage. No evidence of overt contrast extravasation or arterial pseudoaneurysm. Additional selective arteriography was performed near the splenic hilum in order to define bifurcation point of the main splenic artery. After placement of a single vascular plug, there was still flow noted around the plug and eventually the plug migrated in the artery slightly towards the splenic hilum. A second, larger vascular plug was therefore deployed in the distal splenic artery with significant reduction in flow and adequate reduce flow noted. IMPRESSION: Successful embolization of the splenic artery with placement of two separate vascular  plugs. Electronically Signed   By: [name] M.D.   On: 10/31/2022 16:43   IR Angiogram Visceral Selective  Result Date: 10/31/2022 INDICATION: Pancreatitis and spontaneous splenic hemorrhage with parenchymal subcapsular hematoma. Hemoglobin has not responded appropriately to blood transfusion and the patient now presents for splenic artery embolization. EXAM: 1. ULTRASOUND GUIDANCE FOR VASCULAR ACCESS OF THE RIGHT COMMON FEMORAL ARTERY 2. VISCERAL SELECTIVE ARTERIOGRAPHY OF THE SPLENIC ARTERY 3. ADDITIONAL SELECTIVE ARTERIOGRAPHY OF THE DISTAL SPLENIC ARTERY 4. TRANSCATHETER EMBOLIZATION OF THE SPLENIC ARTERY TO TREAT ARTERIAL HEMORRHAGE MEDICATIONS: 2 g IV Ancef. The antibiotic was administered within 1 hour of the procedure ANESTHESIA/SEDATION: Moderate (conscious) sedation was employed during this procedure. A total of Versed 2.0 mg and Fentanyl 100 mcg was administered intravenously. Moderate Sedation Time: 55 minutes. The patient's level of consciousness and vital signs were monitored continuously by radiology nursing throughout the  procedure under my direct supervision. CONTRAST:  25 mL Visipaque 320 FLUOROSCOPY TIME:  Radiation Exposure Index (as provided by the fluoroscopic device): [id number] mGy Kerma COMPLICATIONS: None immediate. PROCEDURE: Informed consent was obtained from the patient following explanation of the procedure, risks, benefits and alternatives. The patient understands, agrees and consents for the procedure. All questions were addressed. A time out was performed prior to the initiation of the procedure. Maximal barrier sterile technique utilized including caps, mask, sterile gowns, sterile gloves, large sterile drape, hand hygiene, and chlorhexidine prep. During the procedure local anesthesia was provided with 1% lidocaine. Ultrasound was used to confirm patency of the right common femoral artery. A permanent ultrasound image was saved and recorded. The right common femoral artery was accessed utilizing a micropuncture set. A 5 French sheath was placed. A 5 French Cobra catheter was advanced into the abdominal aorta. The catheter was used to selectively catheterize the celiac axis. The catheter was then used to selectively catheterize the proximal splenic artery. Selective splenic arteriography was performed. A microcatheter was advanced through the 5 French catheter and further advanced into the distal aspect of the splenic artery. Additional selective splenic arteriography was performed. Embolization was performed through the microcatheter with initial deployment of a Medtronic MVP 5 vascular plug. Additional arteriography was performed through the microcatheter as well as periodic injection of contrast material under fluoroscopy to assess flow in the splenic artery. The microcatheter was removed. The 5 French catheter was then further advanced into the splenic artery. Additional arteriography was performed. An MVP 7 vascular plug was then deployed through the 5 [place] catheter. Additional arteriography was then performed  through the 5 French catheter. The catheter was then removed. Hemostasis was obtained at the sheath access site with the Angio-Seal device. FINDINGS: Initial splenic arteriography demonstrates a tortuous splenic artery supplying an enlarged spleen. Intra-splenic branches are beaded and tortuous especially in the lower pole of the spleen. There is some displacement of vessels in the central aspect of the splenic parenchyma likely related to intraparenchymal hemorrhage. No evidence of overt contrast extravasation or arterial pseudoaneurysm. Additional selective arteriography was performed near the splenic hilum in order to define bifurcation point of the main splenic artery. After placement of a single vascular plug, there was still flow noted around the plug and eventually the plug migrated in the artery slightly towards the splenic hilum. A second, larger vascular plug was therefore deployed in the distal splenic artery with significant reduction in flow and adequate reduce flow noted. IMPRESSION: Successful embolization of the splenic artery with placement of two separate vascular plugs. Electronically Signed   By:  [name] M.D.   On: 10/31/2022 16:43   IR Angiogram Selective Each Additional Vessel  Result Date: 10/31/2022 INDICATION: Pancreatitis and spontaneous splenic hemorrhage with parenchymal subcapsular hematoma. Hemoglobin has not responded appropriately to blood transfusion and the patient now presents for splenic artery embolization. EXAM: 1. ULTRASOUND GUIDANCE FOR VASCULAR ACCESS OF THE RIGHT COMMON FEMORAL ARTERY 2. VISCERAL SELECTIVE ARTERIOGRAPHY OF THE SPLENIC ARTERY 3. ADDITIONAL SELECTIVE ARTERIOGRAPHY OF THE DISTAL SPLENIC ARTERY 4. TRANSCATHETER EMBOLIZATION OF THE SPLENIC ARTERY TO TREAT ARTERIAL HEMORRHAGE MEDICATIONS: 2 g IV Ancef. The antibiotic was administered within 1 hour of the procedure ANESTHESIA/SEDATION: Moderate (conscious) sedation was employed during this procedure. A  total of Versed 2.0 mg and Fentanyl 100 mcg was administered intravenously. Moderate Sedation Time: 55 minutes. The patient's level of consciousness and vital signs were monitored continuously by radiology nursing throughout the procedure under my direct supervision. CONTRAST:  25 mL Visipaque 320 FLUOROSCOPY TIME:  Radiation Exposure Index (as provided by the fluoroscopic device): [id number] mGy Kerma COMPLICATIONS: None immediate. PROCEDURE: Informed consent was obtained from the patient following explanation of the procedure, risks, benefits and alternatives. The patient understands, agrees and consents for the procedure. All questions were addressed. A time out was performed prior to the initiation of the procedure. Maximal barrier sterile technique utilized including caps, mask, sterile gowns, sterile gloves, large sterile drape, hand hygiene, and chlorhexidine prep. During the procedure local anesthesia was provided with 1% lidocaine. Ultrasound was used to confirm patency of the right common femoral artery. A permanent ultrasound image was saved and recorded. The right common femoral artery was accessed utilizing a micropuncture set. A 5 French sheath was placed. A 5 French Cobra catheter was advanced into the abdominal aorta. The catheter was used to selectively catheterize the celiac axis. The catheter was then used to selectively catheterize the proximal splenic artery. Selective splenic arteriography was performed. A microcatheter was advanced through the 5 French catheter and further advanced into the distal aspect of the splenic artery. Additional selective splenic arteriography was performed. Embolization was performed through the microcatheter with initial deployment of a Medtronic MVP 5 vascular plug. Additional arteriography was performed through the microcatheter as well as periodic injection of contrast material under fluoroscopy to assess flow in the splenic artery. The microcatheter was removed. The 5  French catheter was then further advanced into the splenic artery. Additional arteriography was performed. An MVP 7 vascular plug was then deployed through the 5 [place] catheter. Additional arteriography was then performed through the 5 French catheter. The catheter was then removed. Hemostasis was obtained at the sheath access site with the Angio-Seal device. FINDINGS: Initial splenic arteriography demonstrates a tortuous splenic artery supplying an enlarged spleen. Intra-splenic branches are beaded and tortuous especially in the lower pole of the spleen. There is some displacement of vessels in the central aspect of the splenic parenchyma likely related to intraparenchymal hemorrhage. No evidence of overt contrast extravasation or arterial pseudoaneurysm. Additional selective arteriography was performed near the splenic hilum in order to define bifurcation point of the main splenic artery. After placement of a single vascular plug, there was still flow noted around the plug and eventually the plug migrated in the artery slightly towards the splenic hilum. A second, larger vascular plug was therefore deployed in the distal splenic artery with significant reduction in flow and adequate reduce flow noted. IMPRESSION: Successful embolization of the splenic artery with placement of two separate vascular plugs. Electronically Signed   By: Jenness Corner.D.  On: 10/31/2022 16:43   IR EMBO ART  VEN HEMORR LYMPH EXTRAV  INC GUIDE ROADMAPPING  Result Date: 10/31/2022 INDICATION: Pancreatitis and spontaneous splenic hemorrhage with parenchymal subcapsular hematoma. Hemoglobin has not responded appropriately to blood transfusion and the patient now presents for splenic artery embolization. EXAM: 1. ULTRASOUND GUIDANCE FOR VASCULAR ACCESS OF THE RIGHT COMMON FEMORAL ARTERY 2. VISCERAL SELECTIVE ARTERIOGRAPHY OF THE SPLENIC ARTERY 3. ADDITIONAL SELECTIVE ARTERIOGRAPHY OF THE DISTAL SPLENIC ARTERY 4. TRANSCATHETER  EMBOLIZATION OF THE SPLENIC ARTERY TO TREAT ARTERIAL HEMORRHAGE MEDICATIONS: 2 g IV Ancef. The antibiotic was administered within 1 hour of the procedure ANESTHESIA/SEDATION: Moderate (conscious) sedation was employed during this procedure. A total of Versed 2.0 mg and Fentanyl 100 mcg was administered intravenously. Moderate Sedation Time: 55 minutes. The patient's level of consciousness and vital signs were monitored continuously by radiology nursing throughout the procedure under my direct supervision. CONTRAST:  25 mL Visipaque 320 FLUOROSCOPY TIME:  Radiation Exposure Index (as provided by the fluoroscopic device): [id number] mGy Kerma COMPLICATIONS: None immediate. PROCEDURE: Informed consent was obtained from the patient following explanation of the procedure, risks, benefits and alternatives. The patient understands, agrees and consents for the procedure. All questions were addressed. A time out was performed prior to the initiation of the procedure. Maximal barrier sterile technique utilized including caps, mask, sterile gowns, sterile gloves, large sterile drape, hand hygiene, and chlorhexidine prep. During the procedure local anesthesia was provided with 1% lidocaine. Ultrasound was used to confirm patency of the right common femoral artery. A permanent ultrasound image was saved and recorded. The right common femoral artery was accessed utilizing a micropuncture set. A 5 French sheath was placed. A 5 French Cobra catheter was advanced into the abdominal aorta. The catheter was used to selectively catheterize the celiac axis. The catheter was then used to selectively catheterize the proximal splenic artery. Selective splenic arteriography was performed. A microcatheter was advanced through the 5 French catheter and further advanced into the distal aspect of the splenic artery. Additional selective splenic arteriography was performed. Embolization was performed through the microcatheter with initial deployment of  a Medtronic MVP 5 vascular plug. Additional arteriography was performed through the microcatheter as well as periodic injection of contrast material under fluoroscopy to assess flow in the splenic artery. The microcatheter was removed. The 5 French catheter was then further advanced into the splenic artery. Additional arteriography was performed. An MVP 7 vascular plug was then deployed through the 5 [place] catheter. Additional arteriography was then performed through the 5 French catheter. The catheter was then removed. Hemostasis was obtained at the sheath access site with the Angio-Seal device. FINDINGS: Initial splenic arteriography demonstrates a tortuous splenic artery supplying an enlarged spleen. Intra-splenic branches are beaded and tortuous especially in the lower pole of the spleen. There is some displacement of vessels in the central aspect of the splenic parenchyma likely related to intraparenchymal hemorrhage. No evidence of overt contrast extravasation or arterial pseudoaneurysm. Additional selective arteriography was performed near the splenic hilum in order to define bifurcation point of the main splenic artery. After placement of a single vascular plug, there was still flow noted around the plug and eventually the plug migrated in the artery slightly towards the splenic hilum. A second, larger vascular plug was therefore deployed in the distal splenic artery with significant reduction in flow and adequate reduce flow noted. IMPRESSION: Successful embolization of the splenic artery with placement of two separate vascular plugs. Electronically Signed   By: Jenness Corner.D.  On: 10/31/2022 16:43   CT Angio Abd/Pel w/ and/or w/o  Result Date: 10/30/2022 CLINICAL DATA:  Splenomegaly, history of pancreatitis, abdominal pain, evidence of hemorrhage on previous CT EXAM: CTA ABDOMEN AND PELVIS WITHOUT AND WITH CONTRAST TECHNIQUE: Multidetector CT imaging of the abdomen and pelvis was performed  using the standard protocol during bolus administration of intravenous contrast. Multiplanar reconstructed images and MIPs were obtained and reviewed to evaluate the vascular anatomy. RADIATION DOSE REDUCTION: This exam was performed according to the departmental dose-optimization program which includes automated exposure control, adjustment of the mA and/or kV according to patient size and/or use of iterative reconstruction technique. CONTRAST:  82m OMNIPAQUE IOHEXOL 350 MG/ML SOLN COMPARISON:  10/30/2022, 10/20/2022, 10/19/2022 FINDINGS: VASCULAR Aorta: Normal caliber aorta without aneurysm, dissection, vasculitis or significant stenosis. Atherosclerosis. Celiac: Patent without evidence of aneurysm, dissection, vasculitis or significant stenosis. SMA: Patent without evidence of aneurysm, dissection, vasculitis or significant stenosis. Renals: Both renal arteries are patent without evidence of aneurysm, dissection, vasculitis, fibromuscular dysplasia or significant stenosis. IMA: Patent without evidence of aneurysm, dissection, vasculitis or significant stenosis. Inflow: Patent without evidence of aneurysm, dissection, vasculitis or significant stenosis. Proximal Outflow: Bilateral common femoral and visualized portions of the superficial and profunda femoral arteries are patent without evidence of aneurysm, dissection, vasculitis or significant stenosis. Veins: There is mild extrinsic compression upon the main portal vein due to the inflammatory changes of the pancreas. 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 Remaining venous structures are unremarkable. Review of the MIP images confirms the above findings. NON-VASCULAR Lower chest: Trace left pleural effusion. No acute airspace disease. Hepatobiliary: Stable intrahepatic and extrahepatic biliary duct dilation, with common bile duct measuring up to 11  mm. There is narrowing of the downstream common bile duct in the region of the pancreatic head. Minimal gallbladder sludge without evidence of cholecystitis. Stable benign hepatic cysts. Pancreas: Stable parenchymal calcifications within the head and body of the pancreas. There is decreased parenchymal enhancement within the region of the pancreatic head, consistent with edema and pancreatitis. Marked peripancreatic inflammatory change. There is a complex cystic structure adjacent to the tail the pancreas, measuring 4.1 x 2.6 cm, compatible with the hemorrhagic pseudocyst seen on previous imaging. No evidence of contrast extravasation to suggest active hemorrhage. Spleen: The spleen remains enlarged and heterogeneous, measuring approximately 10.0 x 7.7 by 10.5 cm in size. The diffuse heterogeneity of the splenic parenchyma seen previously is again noted, consistent with intracapsular hemorrhage. There is no evidence of contrast extravasation to suggest active bleeding. While no distinct capsular defect is identified, significant perisplenic fat stranding in the left upper quadrant as well as the high attenuation fluid throughout the abdomen and pelvis is concerning for splenic rupture. Adrenals/Urinary Tract: Stable right renal atrophy. No urinary tract calculi or obstruction. Bladder is unremarkable. The adrenals are normal. Stomach/Bowel: No bowel obstruction or ileus. No bowel wall thickening or inflammatory change. Lymphatic: No pathologic adenopathy within the abdomen or pelvis. Reproductive: Stable appearance of the prostate. Other: High attenuation ascites throughout the abdomen and pelvis is unchanged, consistent with hemoperitoneum. No free intraperitoneal gas. No abdominal wall hernia. Musculoskeletal: No acute or destructive bony lesions. Reconstructed images demonstrate no additional findings. IMPRESSION: VASCULAR 1. No evidence of contrast extravasation to suggest active intra-abdominal or intrapelvic  hemorrhage. 2. 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 3.  Aortic Atherosclerosis (ICD10-I70.0). NON-VASCULAR 1. Stable heterogeneous enlargement of the spleen, with perisplenic fat stranding and high attenuation fluid throughout the abdomen and pelvis consistent with intra splenic hemorrhage and likely capsular rupture. 2. Stable changes of pancreatitis, with heterogeneous fluid collection at the pancreatic tail consistent with hemorrhagic pseudocyst. 3. Stable high attenuation free fluid throughout the abdomen and pelvis compatible with hemoperitoneum. 4. Stable intrahepatic and extrahepatic biliary duct dilation, with narrowing of the downstream common bile duct at the level of the pancreatic head. Please see recent MRI evaluation. 5. Gallbladder sludge without cholelithiasis or cholecystitis. 6. Trace left pleural effusion. Electronically Signed   By: Randa Ngo M.D.   On: 10/30/2022 22:37   CT ABDOMEN PELVIS WO CONTRAST  Result Date: 10/30/2022 CLINICAL DATA:  Abdominal pain and syncope, history of pancreatitis, initial encounter EXAM: CT ABDOMEN AND PELVIS WITHOUT CONTRAST TECHNIQUE: Multidetector CT imaging of the abdomen and pelvis was performed following the standard protocol without IV contrast. RADIATION DOSE REDUCTION: This exam was performed according to the departmental dose-optimization program which includes automated exposure control, adjustment of the mA and/or kV according to patient size and/or use of iterative reconstruction technique. COMPARISON:  10/19/2022 FINDINGS: Lower chest: Lung bases are free of acute infiltrate or sizable effusion. Cardiac blood pool is decreased in attenuation consistent with underlying anemia. Hepatobiliary: Gallbladder is within normal limits. Liver shows no focal abnormality. Very mild Peri hepatic  fluid is noted. Pancreas: Diffuse calcifications are noted in the head and uncinate process and to a lesser degree within the body of the pancreas consistent with chronic pancreatitis. Superimposed inflammatory change in the pancreas is noted consistent with acute on chronic pancreatitis. Persistent cystic lesion is noted within the tail of the pancreas again measuring approximately 2.4 cm. This however demonstrates increased density when compared with the recent CT likely related to some underlying hemorrhage within. Spleen: The spleen is significantly enlarged when compare with the prior exam. The previously seen perisplenic fluid collection is not well visualized. These changes are likely related to interval splenic hemorrhage. These changes would correspond with the patient's given clinical history of increased pain. Adrenals/Urinary Tract: Adrenal glands are within normal limits. Kidneys demonstrate no renal calculi or obstructive changes. Simple cyst is noted in the upper pole of the right kidney. No further follow-up is recommended. The bladder is decompressed. Stomach/Bowel: The appendix is not well visualized. No inflammatory changes to suggest appendicitis are noted. Small bowel and stomach appear within normal limits. Vascular/Lymphatic: Aortic atherosclerosis. No enlarged abdominal or pelvic lymph nodes. Reproductive: Pancreas appears within normal limits. Other: Considerable free fluid is noted which is of greater density than that expected for free fluid and with what appears to be a hematocrit level deep in the pelvis. These changes are consistent with intra-abdominal hemorrhage. Musculoskeletal: Degenerative changes of lumbar spine are noted. No acute bony abnormality is seen. IMPRESSION: Changes consistent with persistent acute pancreatitis. There has been interval hemorrhage within the cystic lesion within the tail the pancreas when compared with the prior CT examination. The spleen is now  significantly enlarged and the splenic parenchyma is not well delineated. Given the perisplenic fluid seen previously and increase in free abdominal fluid with hematocrit, these changes are felt to be related to splenic hemorrhage and likely rupture into the peritoneal cavity. Changes of underlying anemia with decreased density of the cardiac blood pool. Critical Value/emergent results were called by telephone at the time of interpretation on  10/30/2022 at 8:28 pm to Dr. Sherwood Gambler , who verbally acknowledged these results. Electronically Signed   By: Inez Catalina M.D.   On: 10/30/2022 20:37   DG Chest Portable 1 View  Result Date: 10/30/2022 CLINICAL DATA:  Syncope.  Nausea.  Vomiting. EXAM: PORTABLE CHEST 1 VIEW COMPARISON:  CXR 10/19/22 FINDINGS: No pleural effusion. No pneumothorax. No focal airspace opacity. Normal cardiac and mediastinal contours. No radiographically apparent displaced rib fractures. Visualized upper abdomen is unremarkable. IMPRESSION: No acute cardiopulmonary disease. Electronically Signed   By: Marin Roberts M.D.   On: 10/30/2022 18:22   MR ABDOMEN MRCP W WO CONTAST  Result Date: 10/20/2022 CLINICAL DATA:  Sequelae of chronic pancreatitis on recent CT. EXAM: MRI ABDOMEN WITHOUT AND WITH CONTRAST (INCLUDING MRCP) TECHNIQUE: Multiplanar multisequence MR imaging of the abdomen was performed both before and after the administration of intravenous contrast. Heavily T2-weighted images of the biliary and pancreatic ducts were obtained, and three-dimensional MRCP images were rendered by post processing. CONTRAST:  8m GADAVIST GADOBUTROL 1 MMOL/ML IV SOLN COMPARISON:  CTA 10/19/2022.  MRI/MRCP 02/10/2021 FINDINGS: Lower chest: Left base atelectasis with left pleural effusion. Hepatobiliary: 6 mm cyst noted in the dome of the liver with 9 mm simple cyst in the anterior right liver. No followup imaging is recommended. Gallbladder is distended with some dependent sludge but no stones  evident. Intra and extrahepatic biliary duct dilatation is similar to previous MRI. Common duct measured previously at 12 mm is 11 mm today. Common bile duct just proximal to the ampulla is 10 mm today compared to 10 mm previously. As noted previously, there is abrupt tapering of the pancreatic duct in the head of the pancreas. No evidence for choledocholithiasis. Pancreas: Pancreatic head is prominent although the marked calcification seen on CT is not evident on MRI. 13 mm simple appearing cyst noted in the anterior pancreatic head, new in the interval since previous MRI. A second cyst in the tail of the pancreas measures 3.5 cm, showing thin smooth rim enhancement and no evidence for internal septation although there is some debris within the collection. Cyst is relatively homogeneous and low signal intensity on T1 with high signal intensity on T2 imaging. No main duct dilatation in the pancreas. Spleen: No splenomegaly. No focal mass lesion. Small collection of fluid posterior to the spleen may be loculated. There is a 4.5 x 2.0 cm rim enhancing collection lateral to the spleen that extends up into the lateral aspect of the subdiaphragmatic space. Adrenals/Urinary Tract: No adrenal nodule or mass. Simple cyst noted upper pole right kidney. No followup imaging is recommended. No suspicious abnormality in the left kidney. No hydronephrosis. Stomach/Bowel: Stomach is unremarkable. No gastric wall thickening. No evidence of outlet obstruction. Duodenum is normally positioned as is the ligament of Treitz. Thickening in jejunal small bowel loops of the left upper quadrant is likely secondary. Vascular/Lymphatic: No abdominal aortic aneurysm. Small hepatoduodenal ligament and retroperitoneal lymph nodes evident. Other: Small volume ascites noted with mesenteric edema. 5.4 x 1.8 cm loculated rim enhancing collections identified lateral to the splenic flexure of the colon. Musculoskeletal: No focal suspicious marrow  enhancement within the visualized bony anatomy. IMPRESSION: 1. Stable intra and extrahepatic biliary duct dilatation with abrupt tapering of the pancreatic duct in the head of the pancreas. No evidence for choledocholithiasis. No discernible obstructing mass lesion. 2. The marked pancreatic parenchymal calcification seen on CT is not evident on MRI. No main duct dilatation in the pancreas. 3. 3.5 cm cyst in  the tail of the pancreas showing thin smooth rim enhancement and some debris within the collection. A second 13 mm cyst identified in the head of the pancreas. These are likely pseudocysts although superinfection cannot be excluded. 4. 4.5 x 2.0 cm rim enhancing fluid collection identified lateral to the spleen with probable loculated fluid posterior to the spleen as well. An additional 5.4 x 1.8 cm loculated rim enhancing collection identified lateral to the splenic flexure of the colon. These may represent pseudocysts although abscess is a possibility. 5. Distended gallbladder with layering sludge. No gallbladder wall thickening or evidence of gallstones. 6. Small volume ascites with mesenteric edema. 7. Left base atelectasis with left pleural effusion. 8. Thickening in jejunal small bowel loops of the left upper quadrant is likely secondary. Electronically Signed   By: Misty Stanley M.D.   On: 10/20/2022 08:40   MR 3D Recon At Scanner  Result Date: 10/20/2022 CLINICAL DATA:  Sequelae of chronic pancreatitis on recent CT. EXAM: MRI ABDOMEN WITHOUT AND WITH CONTRAST (INCLUDING MRCP) TECHNIQUE: Multiplanar multisequence MR imaging of the abdomen was performed both before and after the administration of intravenous contrast. Heavily T2-weighted images of the biliary and pancreatic ducts were obtained, and three-dimensional MRCP images were rendered by post processing. CONTRAST:  66m GADAVIST GADOBUTROL 1 MMOL/ML IV SOLN COMPARISON:  CTA 10/19/2022.  MRI/MRCP 02/10/2021 FINDINGS: Lower chest: Left base  atelectasis with left pleural effusion. Hepatobiliary: 6 mm cyst noted in the dome of the liver with 9 mm simple cyst in the anterior right liver. No followup imaging is recommended. Gallbladder is distended with some dependent sludge but no stones evident. Intra and extrahepatic biliary duct dilatation is similar to previous MRI. Common duct measured previously at 12 mm is 11 mm today. Common bile duct just proximal to the ampulla is 10 mm today compared to 10 mm previously. As noted previously, there is abrupt tapering of the pancreatic duct in the head of the pancreas. No evidence for choledocholithiasis. Pancreas: Pancreatic head is prominent although the marked calcification seen on CT is not evident on MRI. 13 mm simple appearing cyst noted in the anterior pancreatic head, new in the interval since previous MRI. A second cyst in the tail of the pancreas measures 3.5 cm, showing thin smooth rim enhancement and no evidence for internal septation although there is some debris within the collection. Cyst is relatively homogeneous and low signal intensity on T1 with high signal intensity on T2 imaging. No main duct dilatation in the pancreas. Spleen: No splenomegaly. No focal mass lesion. Small collection of fluid posterior to the spleen may be loculated. There is a 4.5 x 2.0 cm rim enhancing collection lateral to the spleen that extends up into the lateral aspect of the subdiaphragmatic space. Adrenals/Urinary Tract: No adrenal nodule or mass. Simple cyst noted upper pole right kidney. No followup imaging is recommended. No suspicious abnormality in the left kidney. No hydronephrosis. Stomach/Bowel: Stomach is unremarkable. No gastric wall thickening. No evidence of outlet obstruction. Duodenum is normally positioned as is the ligament of Treitz. Thickening in jejunal small bowel loops of the left upper quadrant is likely secondary. Vascular/Lymphatic: No abdominal aortic aneurysm. Small hepatoduodenal ligament  and retroperitoneal lymph nodes evident. Other: Small volume ascites noted with mesenteric edema. 5.4 x 1.8 cm loculated rim enhancing collections identified lateral to the splenic flexure of the colon. Musculoskeletal: No focal suspicious marrow enhancement within the visualized bony anatomy. IMPRESSION: 1. Stable intra and extrahepatic biliary duct dilatation with abrupt tapering  of the pancreatic duct in the head of the pancreas. No evidence for choledocholithiasis. No discernible obstructing mass lesion. 2. The marked pancreatic parenchymal calcification seen on CT is not evident on MRI. No main duct dilatation in the pancreas. 3. 3.5 cm cyst in the tail of the pancreas showing thin smooth rim enhancement and some debris within the collection. A second 13 mm cyst identified in the head of the pancreas. These are likely pseudocysts although superinfection cannot be excluded. 4. 4.5 x 2.0 cm rim enhancing fluid collection identified lateral to the spleen with probable loculated fluid posterior to the spleen as well. An additional 5.4 x 1.8 cm loculated rim enhancing collection identified lateral to the splenic flexure of the colon. These may represent pseudocysts although abscess is a possibility. 5. Distended gallbladder with layering sludge. No gallbladder wall thickening or evidence of gallstones. 6. Small volume ascites with mesenteric edema. 7. Left base atelectasis with left pleural effusion. 8. Thickening in jejunal small bowel loops of the left upper quadrant is likely secondary. Electronically Signed   By: Misty Stanley M.D.   On: 10/20/2022 08:40   US Abdomen Limited RUQ (LIVER/GB)  Result Date: 10/19/2022 CLINICAL DATA:  Epigastric pain EXAM: ULTRASOUND ABDOMEN LIMITED RIGHT UPPER QUADRANT COMPARISON:  CT angiogram chest abdomen and pelvis 10/19/2022 FINDINGS: Gallbladder: No gallstones or wall thickening visualized. Gallbladder is dilated. Gallbladder sludge is present. No sonographic Murphy sign  noted by sonographer. Common bile duct: Diameter: 10 mm. There is also mild-to-moderate intrahepatic biliary ductal dilatation. Liver: Two hepatic cysts are identified. Right lobe cyst measures 11 x 10 x 11 mm. Left lobe cyst measures 11 x 7 x 8 mm. Within normal limits in parenchymal echogenicity. Portal vein is patent on color Doppler imaging with normal direction of blood flow towards the liver. Other: Incidental right renal simple cyst measuring 2.2 x 1.7 x 3.4 cm. IMPRESSION: 1. Gallbladder hydrops with gallbladder sludge. No gallstones or wall thickening. 2. Dilated common bile duct with mild-to-moderate intrahepatic biliary ductal dilatation. Recommend further evaluation with MRCP. Electronically Signed   By: Ronney Asters M.D.   On: 10/19/2022 21:06   CT Angio Chest/Abd/Pel for Dissection W and/or W/WO  Result Date: 10/19/2022 CLINICAL DATA:  Concern for aerated aneurysm. Left-sided chest pain. EXAM: CT ANGIOGRAPHY CHEST, ABDOMEN AND PELVIS TECHNIQUE: Non-contrast CT of the chest was initially obtained. Multidetector CT imaging through the chest, abdomen and pelvis was performed using the standard protocol during bolus administration of intravenous contrast. Multiplanar reconstructed images and MIPs were obtained and reviewed to evaluate the vascular anatomy. RADIATION DOSE REDUCTION: This exam was performed according to the departmental dose-optimization program which includes automated exposure control, adjustment of the mA and/or kV according to patient size and/or use of iterative reconstruction technique. CONTRAST:  116m OMNIPAQUE IOHEXOL 350 MG/ML SOLN COMPARISON:  CT abdomen pelvis dated 01/04/2022. FINDINGS: Evaluation is limited due to streak artifact caused by patient's arms. Evaluation is also limited due to anasarca and paucity of intra-abdominal fat. CTA CHEST FINDINGS Cardiovascular: There is no cardiomegaly or pericardial effusion. There is mild atherosclerotic calcification of the  thoracic aorta. No aneurysmal dilatation or dissection. The aorta is tortuous. The origins of the great vessels of the aortic arch appear patent. No pulmonary artery embolus identified. Mediastinum/Nodes: No hilar or mediastinal adenopathy. The esophagus is grossly unremarkable. No mediastinal fluid collection. Lungs/Pleura: Small left pleural effusion with partial compressive atelectasis of the left versus pneumonia. There is background of centrilobular emphysema. There is no pneumothorax. The  central airways are patent. Musculoskeletal: Degenerative changes of the spine. No acute osseous pathology. Review of the MIP images confirms the above findings. CTA ABDOMEN AND PELVIS FINDINGS VASCULAR Aorta: Mild atherosclerotic calcification of the abdominal aorta. No aneurysmal dilatation or dissection. Celiac: Patent without evidence of aneurysm, dissection, vasculitis or significant stenosis. SMA: Patent without evidence of aneurysm, dissection, vasculitis or significant stenosis. Renals: Both renal arteries are patent without evidence of aneurysm, dissection, vasculitis, fibromuscular dysplasia or significant stenosis. IMA: Patent without evidence of aneurysm, dissection, vasculitis or significant stenosis. Inflow: Mild atherosclerotic calcification of the iliac arteries. The iliac arteries are patent. No aneurysmal dilatation or dissection. Veins: No obvious venous abnormality within the limitations of this arterial phase study. Review of the MIP images confirms the above findings. NON-VASCULAR No intra-abdominal free air.  Small free fluid within the pelvis. Hepatobiliary: Several small liver cysts, too small to characterize. There is mild dilatation of the biliary tree. No calcified stone within the gallbladder. Pancreas: There is coarse calcification of the head and uncinate process of the pancreas sequela of chronic pancreatitis. Correlation with pancreatic enzymes recommended to evaluate for for possibility of  acute on chronic pancreatitis. There is a 3.3 x 2.6 cm cyst in the tail of the pancreas most consistent with a pseudocyst. Attention on follow-up imaging recommended. Spleen: The spleen is unremarkable. There is perisplenic free fluid which appears somewhat loculated, likely sequela of pancreatitis. This fluid may represent a pseudocyst. Adrenals/Urinary Tract: The adrenal glands are poorly visualized. There is a small right renal upper pole cyst. There is no hydronephrosis on either side. The urinary bladder is grossly unremarkable. Stomach/Bowel: There is moderate stool throughout the colon. There is no bowel obstruction. Lymphatic: No adenopathy. Reproductive: The prostate gland is mildly enlarged measuring 4.5 cm in transverse axial diameter. The seminal vesicles are symmetric. Other: Diffuse mesenteric and subcutaneous edema and anasarca. Musculoskeletal: Degenerative changes of the spine. No acute osseous pathology. Review of the MIP images confirms the above findings. IMPRESSION: 1. No aortic aneurysm or dissection. 2. Small left pleural effusion with partial compressive atelectasis of the left versus pneumonia. 3. Sequela of chronic pancreatitis including pancreatic head calcification pancreatic tail pseudocyst. Correlation with pancreatic enzymes recommended to evaluate for possibility of acute on chronic pancreatitis. 4. Perisplenic fluid collection most consistent with post inflammatory collection or developing pseudocysts. An abscess sign report cyst is not excluded. 5. No bowel obstruction. 6. Aortic Atherosclerosis (ICD10-I70.0) and Emphysema (ICD10-J43.9). Electronically Signed   By: Anner Crete M.D.   On: 10/19/2022 17:41   DG Chest 2 View  Result Date: 10/19/2022 CLINICAL DATA:  Chest pain EXAM: CHEST - 2 VIEW COMPARISON:  01/04/2022 FINDINGS: Asymmetric elevation left hemidiaphragm. Trace atelectasis noted left base with possible tiny left pleural effusion. Right lung clear. The  cardiopericardial silhouette is within normal limits for size. The visualized bony structures of the thorax are unremarkable. IMPRESSION: Asymmetric elevation left hemidiaphragm with trace left base atelectasis and possible tiny left pleural effusion. Electronically Signed   By: Misty Stanley M.D.   On: 10/19/2022 13:41      Subjective: Patient seen and examined at bedside.  No distress currently.    Discharge Exam: Vitals:   11/12/22 1930 11/13/22 0901  BP:    Pulse:  98  Resp:  13  Temp: 97.6 F (36.4 C)   SpO2:  (!) 89%    General exam: Looks chronically ill and deconditioned.  Extremely drowsy.  No distress currently.     The results  of significant diagnostics from this hospitalization (including imaging, microbiology, ancillary and laboratory) are listed below for reference.     Microbiology: Recent Results (from the past 240 hour(s))  Gram stain     Status: None   Collection Time: 11/03/22  2:30 PM   Specimen: PATH Cytology Pleural fluid  Result Value Ref Range Status   Specimen Description   Final    PLEURAL Performed at [facility] [address] [place], [place] [address]    Special Requests   Final    NONE Performed at [facility], [place] [address], [place], [place] [address]    Gram Stain   Final    CYTOSPIN SMEAR WBC PRESENT, PREDOMINANTLY PMN NO ORGANISMS SEEN Performed at [facility], [place] [address] [place], [place] [address]    Report Status 11/03/2022 FINAL  Final  Culture, blood (Routine X 2) w Reflex to ID Panel     Status: None   Collection Time: 11/04/22  9:34 AM   Specimen: BLOOD  Result Value Ref Range Status   Specimen Description   Final    BLOOD BLOOD RIGHT HAND Performed at [place] [address], [place], [place] [address]    Special Requests   Final    BOTTLES DRAWN AEROBIC AND ANAEROBIC Blood Culture adequate volume Performed at [facility] [address], [place], [place] [address]    Culture   Final    NO GROWTH 5 DAYS Performed at [facility], [place] [address], [place], [place] [address]    Report Status 11/09/2022 FINAL  Final  Culture, blood (Routine X 2) w Reflex to ID Panel     Status: None   Collection Time: 11/04/22  9:37 AM   Specimen: BLOOD  Result Value Ref Range Status   Specimen Description   Final    BLOOD BLOOD LEFT HAND Performed at [place] [address], [place], [place] [address]    Special Requests   Final    BOTTLES DRAWN AEROBIC AND ANAEROBIC Blood Culture adequate volume Performed at [facility] [address] [place], [place] [address]    Culture   Final    NO GROWTH 5 DAYS Performed at [facility], [place] [address] [place], [place] [address]    Report Status 11/09/2022 FINAL  Final  MRSA Next Gen by PCR, Nasal     Status: None   Collection Time: 11/09/22  8:34 PM   Specimen: Nasal Mucosa; Nasal Swab  Result Value Ref Range Status   MRSA by PCR Next Gen NOT DETECTED NOT DETECTED Final    Comment: (NOTE) The GeneXpert MRSA Assay (FDA approved for NASAL specimens only), is one component of a comprehensive MRSA colonization surveillance program. It is not intended to diagnose MRSA infection nor to guide or monitor treatment for MRSA infections. Test performance is not FDA approved in patients less than 37 years old. Performed at [facility], [place] [address] [place], [place] [address]   Culture, blood (Routine X 2) w Reflex to ID Panel     Status: None (Preliminary result)   Collection Time: 11/09/22  9:20 PM   Specimen: BLOOD RIGHT HAND  Result Value Ref Range Status   Specimen Description   Final    BLOOD RIGHT HAND Performed at [facility], [place] [address] [place], [place] [address]    Special Requests  Final    BOTTLES DRAWN AEROBIC ONLY Blood Culture adequate volume Performed at [place] [address] [place], [place] [address]    Culture   Final    NO GROWTH 4 DAYS Performed at [facility], [place] [address] [place], [place] [address]    Report Status PENDING  Incomplete  Culture, blood (Routine X 2) w Reflex to ID Panel     Status: None (Preliminary result)   Collection Time: 11/09/22  9:20 PM   Specimen: BLOOD LEFT HAND  Result Value Ref Range Status   Specimen Description   Final    BLOOD LEFT HAND Performed at [facility], [place] [address], [place], [place] [address]    Special Requests   Final    BOTTLES DRAWN AEROBIC ONLY Blood Culture adequate volume Performed at [facility] [address], [place], [place] [address]    Culture   Final    NO GROWTH 4 DAYS Performed at [facility], [place] [address], [place],  [address]    Report Status PENDING  Incomplete     Labs: BNP (last 3 results) No results for input(s): "BNP" in the last 8760 hours. Basic Metabolic Panel: Recent Labs  Lab 11/09/22 1440 11/09/22 1510 11/09/22 2120 11/10/22 0012 11/10/22 1227  NA 134* 135  --  132* 133*  K 4.3 4.4  --  6.2* 5.1  CL 102 101  --  103 104  CO2 24  --   --  21* 18*  GLUCOSE 136* 132*  --  160* 157*  BUN 35* 34*  --  38* 48*  CREATININE 1.25* 1.20 1.29* 1.32* 2.00*  CALCIUM 7.8*  --   --  7.4* 7.3*  MG  --  2.6*  --   --  2.3  PHOS  --  4.9*  --   --  6.1*   Liver Function Tests: Recent Labs  Lab 11/09/22 1440 11/10/22 0012  AST 36 44*  ALT 17 15  ALKPHOS 125 113  BILITOT 2.2* 1.9*  PROT 7.1 6.0*  ALBUMIN 1.9* 1.6*   Recent Labs  Lab 11/09/22 1440 11/10/22 0012  LIPASE 164* 303*   No results for input(s): "AMMONIA" in the last 168 hours. CBC: Recent Labs  Lab 11/09/22 1440 11/09/22 1510 11/09/22 2120 11/10/22 0012  WBC 28.8*  --  22.8* 20.2*  NEUTROABS 24.5*  --   --   --   HGB 9.3* 10.5* 8.9* 11.2*  HCT 28.1* 31.0* 27.6* 35.0*  MCV 94.9  --  95.8 96.2  PLT 533*  --   582* 697*   Cardiac Enzymes: No results for input(s): "CKTOTAL", "CKMB", "CKMBINDEX", "TROPONINI" in the last 168 hours. BNP: Invalid input(s): "POCBNP" CBG: Recent Labs  Lab 11/10/22 0148 11/10/22 1719  GLUCAP 142* 133*   D-Dimer No results for input(s): "DDIMER" in the last 72 hours. Hgb A1c No results for input(s): "HGBA1C" in the last 72 hours. Lipid Profile No results for input(s): "CHOL", "HDL", "LDLCALC", "TRIG", "CHOLHDL", "LDLDIRECT" in the last 72 hours. Thyroid function studies No results for input(s): "TSH", "T4TOTAL", "T3FREE", "THYROIDAB" in the last 72 hours.  Invalid input(s): "FREET3" Anemia work up No results for input(s): "VITAMINB12", "FOLATE", "FERRITIN", "TIBC", "IRON", "RETICCTPCT" in the last 72 hours. Urinalysis    Component Value Date/Time   COLORURINE AMBER (A) 10/31/2022 0643   APPEARANCEUR CLEAR 10/31/2022 0643   LABSPEC 1.036 (H) 10/31/2022 0643   PHURINE 5.0 10/31/2022  [place] 10/31/2022 [place] 10/31/2022 [place] 10/31/2022 0643   KETONESUR NEGATIVE 10/31/2022 0643   PROTEINUR 30 (A) 10/31/2022 0643   NITRITE NEGATIVE 10/31/2022 0643   LEUKOCYTESUR NEGATIVE 10/31/2022 0643   Sepsis Labs Recent Labs  Lab 11/09/22 1440 11/09/22 2120 11/10/22 0012  WBC 28.8* 22.8* 20.2*   Microbiology Recent Results (from the past 240 hour(s))  Gram stain     Status: None   Collection Time: 11/03/22  2:30 PM   Specimen: PATH Cytology Pleural fluid  Result Value Ref Range Status   Specimen Description   Final    PLEURAL Performed at [facility], [place] [address], [place], [place] [address]    Special Requests   Final    NONE Performed at [facility], [place] [address] [place], [place] [address]    Gram Stain   Final    CYTOSPIN SMEAR WBC PRESENT, PREDOMINANTLY PMN NO ORGANISMS SEEN Performed at [facility], [place] [address] [place], [place]  [address]    Report Status 11/03/2022 FINAL  Final  Culture, blood (Routine X 2) w Reflex to ID Panel     Status: None   Collection Time: 11/04/22  9:34 AM   Specimen: BLOOD  Result Value Ref Range Status   Specimen Description   Final    BLOOD BLOOD RIGHT HAND Performed at [facility] [address], [place], [place] [address]    Special Requests   Final    BOTTLES DRAWN AEROBIC AND ANAEROBIC Blood Culture adequate volume Performed at [place] [address], [place], [place] [address]    Culture   Final    NO GROWTH 5 DAYS Performed at [facility], [place] [address], [place], [place] [address]    Report Status 11/09/2022 FINAL  Final  Culture, blood (Routine X 2) w Reflex to ID Panel     Status: None   Collection Time: 11/04/22  9:37 AM   Specimen: BLOOD  Result Value Ref Range Status   Specimen Description   Final    BLOOD BLOOD LEFT HAND Performed at [place] [address], [place], [place] [address]    Special Requests   Final    BOTTLES DRAWN AEROBIC AND ANAEROBIC Blood Culture adequate volume Performed at [facility] [address], [place], [place] [address]    Culture   Final    NO GROWTH 5 DAYS Performed at [facility], [place] [address], [place], [place] [address]    Report Status 11/09/2022 FINAL  Final  MRSA Next Gen by PCR, Nasal     Status: None   Collection Time: 11/09/22  8:34 PM   Specimen: Nasal Mucosa; Nasal Swab  Result Value Ref Range Status   MRSA by PCR Next Gen NOT DETECTED NOT DETECTED Final    Comment: (NOTE) The GeneXpert MRSA Assay (FDA approved for NASAL specimens only), is one component of a comprehensive MRSA colonization surveillance program. It is not intended to diagnose MRSA infection nor to guide or monitor treatment for MRSA infections. Test performance is not FDA approved in patients less than 83 years old. Performed at [facility], [place] [address] [place], [place] [address]   Culture, blood (Routine X 2) w Reflex to ID Panel     Status: None (Preliminary result)   Collection Time: 11/09/22  9:20 PM  Specimen: BLOOD RIGHT HAND  Result Value Ref Range Status   Specimen Description   Final    BLOOD RIGHT HAND Performed at [facility], [place] [address] [place], [place] [address]    Special Requests   Final    BOTTLES DRAWN AEROBIC ONLY Blood Culture adequate volume Performed at [facility] [address], [place], [place] [address]    Culture   Final    NO GROWTH 4 DAYS Performed at [facility], [place] [address], [place], [place] [address]    Report Status PENDING  Incomplete  Culture, blood (Routine X 2) w Reflex to ID Panel     Status: None (Preliminary result)   Collection Time: 11/09/22  9:20 PM   Specimen: BLOOD LEFT HAND  Result Value Ref Range Status   Specimen Description   Final    BLOOD LEFT HAND Performed at [facility], [place] [address] [place], [place] [address]    Special Requests   Final    BOTTLES DRAWN AEROBIC ONLY Blood Culture adequate volume Performed at [place] [address] [place], [place] [address]    Culture   Final    NO GROWTH 4 DAYS Performed at [facility], [place] [address], [place], [place] [address]    Report Status PENDING  Incomplete     Time coordinating discharge: 35 minutes  SIGNED:   Aline August, MD  [facility] 11/13/2022, 9:57 AM

## 2022-11-13 NOTE — Progress Notes (Signed)
PROGRESS NOTE    Jared Duran  [id number] DOB: 1950/12/01 DOA: 11/09/2022 PCP: [name], MD   Brief Narrative:  72 year old gentleman with history of chronic recurrent pancreatitis, requiring multiple hospitalizations with most recent admission from 10/30/2022-11/06/2022 for spontaneous splenic hemorrhage complicated by leukocytosis and left-sided pleural effusion status post splenic artery arteriogram and embolization, blood transfusions and left-sided thoracentesis presented with worsening abdominal pain, nausea, vomiting and diarrhea with night sweats and fevers and extreme weakness.  On presentation, he had significant abdominal distention, hypoxemia with oxygen saturation of 86% on room air and was placed on 6 L of oxygen.  In the ED, he was hypotensive with leukocytosis and CT scan showing large perisplenic hematoma 30 cm in size.  NG tube was placed.  General surgery was consulted and patient was started on IV antibiotics.  He developed rapid A-fib requiring amiodarone.  Because of overall very poor prognosis, after discussion with general surgery, patient decided to not pursue any surgical treatment and wanted to pursue comfort measures.  Comfort measures started on 11/11/2022.  Assessment & Plan:   Comfort measures only status Severe sepsis versus hypovolemic shock: Present on admission secondary to intra-abdominal hematoma Ileus in the setting of pancreatitis and pseudocyst Severe/intractable abdominal pain Very poor oral intake Failure to thrive Severe protein calorie malnutrition Paroxysmal new onset A-fib with RVR AKI on CKD stage II, now anuric Hyponatremia Hyperkalemia Leukocytosis Normocytic anemia Thrombocytosis  Plan -As discussed above, he was switched to comfort measures only status on 11/11/2022 and has been started on Dilaudid drip.  TOC following for possible residential hospice placement.   DVT prophylaxis: None for comfort measures Code Status:  DNR Family Communication: None at bedside Disposition Plan: Status is: Inpatient Remains inpatient appropriate because: Of severity of illness.  Consultants: General surgery  Procedures: None  Antimicrobials:  Anti-infectives (From admission, onward)    Start     Dose/Rate Route Frequency Ordered Stop   11/10/22 2000  vancomycin (VANCOCIN) IVPB 1000 mg/200 mL premix  Status:  Discontinued        1,000 mg 200 mL/hr over 60 Minutes Intravenous Every 24 hours 11/09/22 1932 11/10/22 1221   11/10/22 0200  piperacillin-tazobactam (ZOSYN) IVPB 3.375 g  Status:  Discontinued        3.375 g 12.5 mL/hr over 240 Minutes Intravenous Every 8 hours 11/09/22 1929 11/11/22 0845   11/09/22 2145  piperacillin-tazo (ZOSYN) NICU IV syringe 225 mg/mL  Status:  Discontinued        100 mg/kg of piperacillin  58.7 kg 58.8 mL/hr over 30 Minutes Intravenous Every 8 hours 11/09/22 2058 11/09/22 2109   11/09/22 1915  piperacillin-tazobactam (ZOSYN) IVPB 3.375 g        3.375 g 100 mL/hr over 30 Minutes Intravenous NOW 11/09/22 1906 11/09/22 1950   11/09/22 1915  vancomycin (VANCOREADY) IVPB 1250 mg/250 mL        1,250 mg 166.7 mL/hr over 90 Minutes Intravenous NOW 11/09/22 1906 11/10/22 0340        Subjective: Patient seen and examined at bedside.  No distress currently.   Objective: Vitals:   11/12/22 1700 11/12/22 1800 11/12/22 1900 11/12/22 1930  BP:      Pulse: 95 95 95   Resp: '14 15 15   '$ Temp:    97.6 F (36.4 C)  TempSrc:    Axillary  SpO2: (!) 89% (!) 89% (!) 89%   Weight:      Height:        Intake/Output Summary (Last  24 hours) at 11/13/2022 0728 Last data filed at 11/13/2022 0648 Gross per 24 hour  Intake 23.37 ml  Output 565 ml  Net -541.63 ml    Filed Weights   11/10/22 0825  Weight: 58.7 kg    Examination:  General exam: Looks chronically ill and deconditioned.  Extremely drowsy.  No distress currently.  Data Reviewed: I have personally reviewed following labs and  imaging studies  CBC: Recent Labs  Lab 11/09/22 1440 11/09/22 1510 11/09/22 2120 11/10/22 0012  WBC 28.8*  --  22.8* 20.2*  NEUTROABS 24.5*  --   --   --   HGB 9.3* 10.5* 8.9* 11.2*  HCT 28.1* 31.0* 27.6* 35.0*  MCV 94.9  --  95.8 96.2  PLT 533*  --  582* 697*    Basic Metabolic Panel: Recent Labs  Lab 11/09/22 1440 11/09/22 1510 11/09/22 2120 11/10/22 0012 11/10/22 1227  NA 134* 135  --  132* 133*  K 4.3 4.4  --  6.2* 5.1  CL 102 101  --  103 104  CO2 24  --   --  21* 18*  GLUCOSE 136* 132*  --  160* 157*  BUN 35* 34*  --  38* 48*  CREATININE 1.25* 1.20 1.29* 1.32* 2.00*  CALCIUM 7.8*  --   --  7.4* 7.3*  MG  --  2.6*  --   --  2.3  PHOS  --  4.9*  --   --  6.1*    GFR: Estimated Creatinine Clearance: 28.9 mL/min (A) (by C-G formula based on SCr of 2 mg/dL (H)). Liver Function Tests: Recent Labs  Lab 11/09/22 1440 11/10/22 0012  AST 36 44*  ALT 17 15  ALKPHOS 125 113  BILITOT 2.2* 1.9*  PROT 7.1 6.0*  ALBUMIN 1.9* 1.6*    Recent Labs  Lab 11/09/22 1440 11/10/22 0012  LIPASE 164* 303*    No results for input(s): "AMMONIA" in the last 168 hours. Coagulation Profile: Recent Labs  Lab 11/09/22 1440  INR 1.1    Cardiac Enzymes: No results for input(s): "CKTOTAL", "CKMB", "CKMBINDEX", "TROPONINI" in the last 168 hours. BNP (last 3 results) No results for input(s): "PROBNP" in the last 8760 hours. HbA1C: No results for input(s): "HGBA1C" in the last 72 hours.  CBG: Recent Labs  Lab 11/10/22 0148 11/10/22 1719  GLUCAP 142* 133*    Lipid Profile: No results for input(s): "CHOL", "HDL", "LDLCALC", "TRIG", "CHOLHDL", "LDLDIRECT" in the last 72 hours. Thyroid Function Tests: No results for input(s): "TSH", "T4TOTAL", "FREET4", "T3FREE", "THYROIDAB" in the last 72 hours. Anemia Panel: No results for input(s): "VITAMINB12", "FOLATE", "FERRITIN", "TIBC", "IRON", "RETICCTPCT" in the last 72 hours. Sepsis Labs: No results for input(s):  "PROCALCITON", "LATICACIDVEN" in the last 168 hours.  Recent Results (from the past 240 hour(s))  Gram stain     Status: None   Collection Time: 11/03/22  2:30 PM   Specimen: PATH Cytology Pleural fluid  Result Value Ref Range Status   Specimen Description   Final    PLEURAL Performed at [facility] [address] [place], [place] [address]    Special Requests   Final    NONE Performed at [facility], [place] [address], [place], [place] [address]    Gram Stain   Final    CYTOSPIN SMEAR WBC PRESENT, PREDOMINANTLY PMN NO ORGANISMS SEEN Performed at [facility], [place] [address] [place], [place] [address]    Report Status 11/03/2022 FINAL  Final  Culture, blood (Routine X 2) w Reflex to ID Panel     Status: None   Collection Time: 11/04/22  9:34 AM   Specimen: BLOOD  Result Value Ref Range Status   Specimen Description   Final    BLOOD BLOOD RIGHT HAND Performed at [facility] [address], [place], [place] [address]    Special Requests   Final    BOTTLES DRAWN AEROBIC AND ANAEROBIC Blood Culture adequate volume Performed at [facility] [address], [place], [place] [address]    Culture   Final    NO GROWTH 5 DAYS Performed at [facility], [place] [address] [place], [place] [address]    Report Status 11/09/2022 FINAL  Final  Culture, blood (Routine X 2) w Reflex to ID Panel     Status: None   Collection Time: 11/04/22  9:37 AM   Specimen: BLOOD  Result Value Ref Range Status   Specimen Description   Final    BLOOD BLOOD LEFT HAND Performed at [facility] [address] [place], [place] [address]    Special Requests   Final    BOTTLES DRAWN AEROBIC AND ANAEROBIC Blood Culture adequate volume Performed at [facility] [address] [place], [place] [address]    Culture   Final    NO GROWTH 5 DAYS Performed at [place], [place] [address] [place], [place] [address]    Report Status 11/09/2022 FINAL  Final  MRSA Next Gen by PCR, Nasal     Status: None   Collection Time: 11/09/22  8:34 PM   Specimen: Nasal Mucosa; Nasal Swab  Result Value Ref Range Status   MRSA by PCR Next Gen NOT DETECTED NOT DETECTED Final    Comment: (NOTE) The GeneXpert MRSA Assay (FDA approved for NASAL specimens only), is one component of a comprehensive MRSA colonization surveillance program. It is not intended to diagnose MRSA infection nor to guide or monitor treatment for MRSA infections. Test performance is not FDA approved in patients less than 21 years old. Performed at [facility], [place] [address] [place], [place] [address]   Culture, blood (Routine X 2) w Reflex to ID Panel     Status: None (Preliminary result)   Collection Time: 11/09/22  9:20 PM   Specimen: BLOOD RIGHT HAND  Result Value Ref Range Status   Specimen Description   Final    BLOOD RIGHT HAND Performed at [facility], [place] [address] [place], [place] [address]    Special Requests   Final    BOTTLES DRAWN AEROBIC ONLY Blood Culture adequate volume Performed at [facility] [address] [place], [place] [address]    Culture   Final    NO GROWTH 3 DAYS Performed at [facility], [place] [address] [place], [place] [address]    Report Status PENDING  Incomplete  Culture, blood (Routine X 2) w Reflex to ID Panel     Status: None (Preliminary result)   Collection Time: 11/09/22  9:20 PM   Specimen: BLOOD LEFT HAND  Result Value Ref Range Status   Specimen Description   Final    BLOOD LEFT HAND Performed at [facility], [place] [address] [place], [place] [address]    Special Requests   Final    BOTTLES DRAWN AEROBIC ONLY Blood Culture adequate volume Performed at [facility], [place] [address] [place], [place] [address]    Culture   Final    NO GROWTH 3 DAYS Performed at  [facility], [place] [address] [place], [place] [address]    Report Status PENDING  Incomplete         Radiology Studies: No results found.      Scheduled Meds:  Chlorhexidine Gluconate Cloth  6 each Topical Daily   sodium chloride flush  3 mL Intravenous Q12H   Continuous Infusions:  sodium chloride     HYDROmorphone 1 mg/hr (11/13/22 0423)          Aline August, MD [facility] 11/13/2022, 7:28 AM

## 2022-11-13 NOTE — TOC Transition Note (Signed)
Transition of Care [facility]) - CM/SW Discharge Note   Patient Details  Name: Jared Duran MRN: [id number] Date of Birth: 07/18/1951  [facility]SW Contact:  Servando Snare, LCSW Phone Number: 11/13/2022, 10:36 AM   Clinical Narrative:    Awaiting daughter to complete hospice paperwork. Patient to dc to [facility]. Patient to transfer via [place].   RN number for report [phone] report can be called once hospice gives the go ahead.    Final next level of care: [place] Barriers to Discharge: No Barriers Identified   Patient Goals and CMS Choice CMS Medicare.gov Compare Post Acute Care list provided to:: Patient Choice offered to / list presented to : Patient  Discharge Placement                    Name of family member notified: Kerin Perna, Daughter Patient and family notified of of transfer: 11/13/22  Discharge Plan and Services Additional resources added to the After Visit Summary for                  DME Arranged: N/A DME Agency: NA       HH Arranged: NA [place] Agency: NA        Social Determinants of Health (SDOH) Interventions SDOH Screenings   Food Insecurity: No Food Insecurity (11/10/2022)  Housing: [place]  (11/10/2022)  Transportation Needs: No Transportation Needs (11/10/2022)  Utilities: Not At Risk (11/10/2022)  Tobacco Use: High Risk (11/10/2022)     Readmission Risk Interventions    11/03/2022    9:23 AM 01/07/2022    9:55 AM  Readmission Risk Prevention Plan  Transportation Screening Complete Complete  PCP or Specialist Appt within 5-7 Days Complete   Home Care Screening Complete Complete  Medication Review (RN CM) Complete Complete

## 2022-11-13 NOTE — Progress Notes (Signed)
   This pt has been reviewed and was approved for our hospice facility in [facility]. The daughter has agreed to the services and the comfort care approach which was started this hospitalization. We do have a bed to offer and can accept pt today. Daughter in agreement.   Webb Silversmith RN [phone]

## 2022-11-14 LAB — CULTURE, BLOOD (ROUTINE X 2)
Culture: NO GROWTH
Culture: NO GROWTH
Special Requests: ADEQUATE
Special Requests: ADEQUATE

## 2022-12-08 DEATH — deceased

## 2024-03-05 IMAGING — CR DG CHEST 2V
2 series · 2 of 2 positions shown · non-contrast
Comparison: 04/10/2020

CLINICAL DATA: Chest pain, shortness of breath

EXAM:
CHEST - 2 VIEW

[w chest pa]
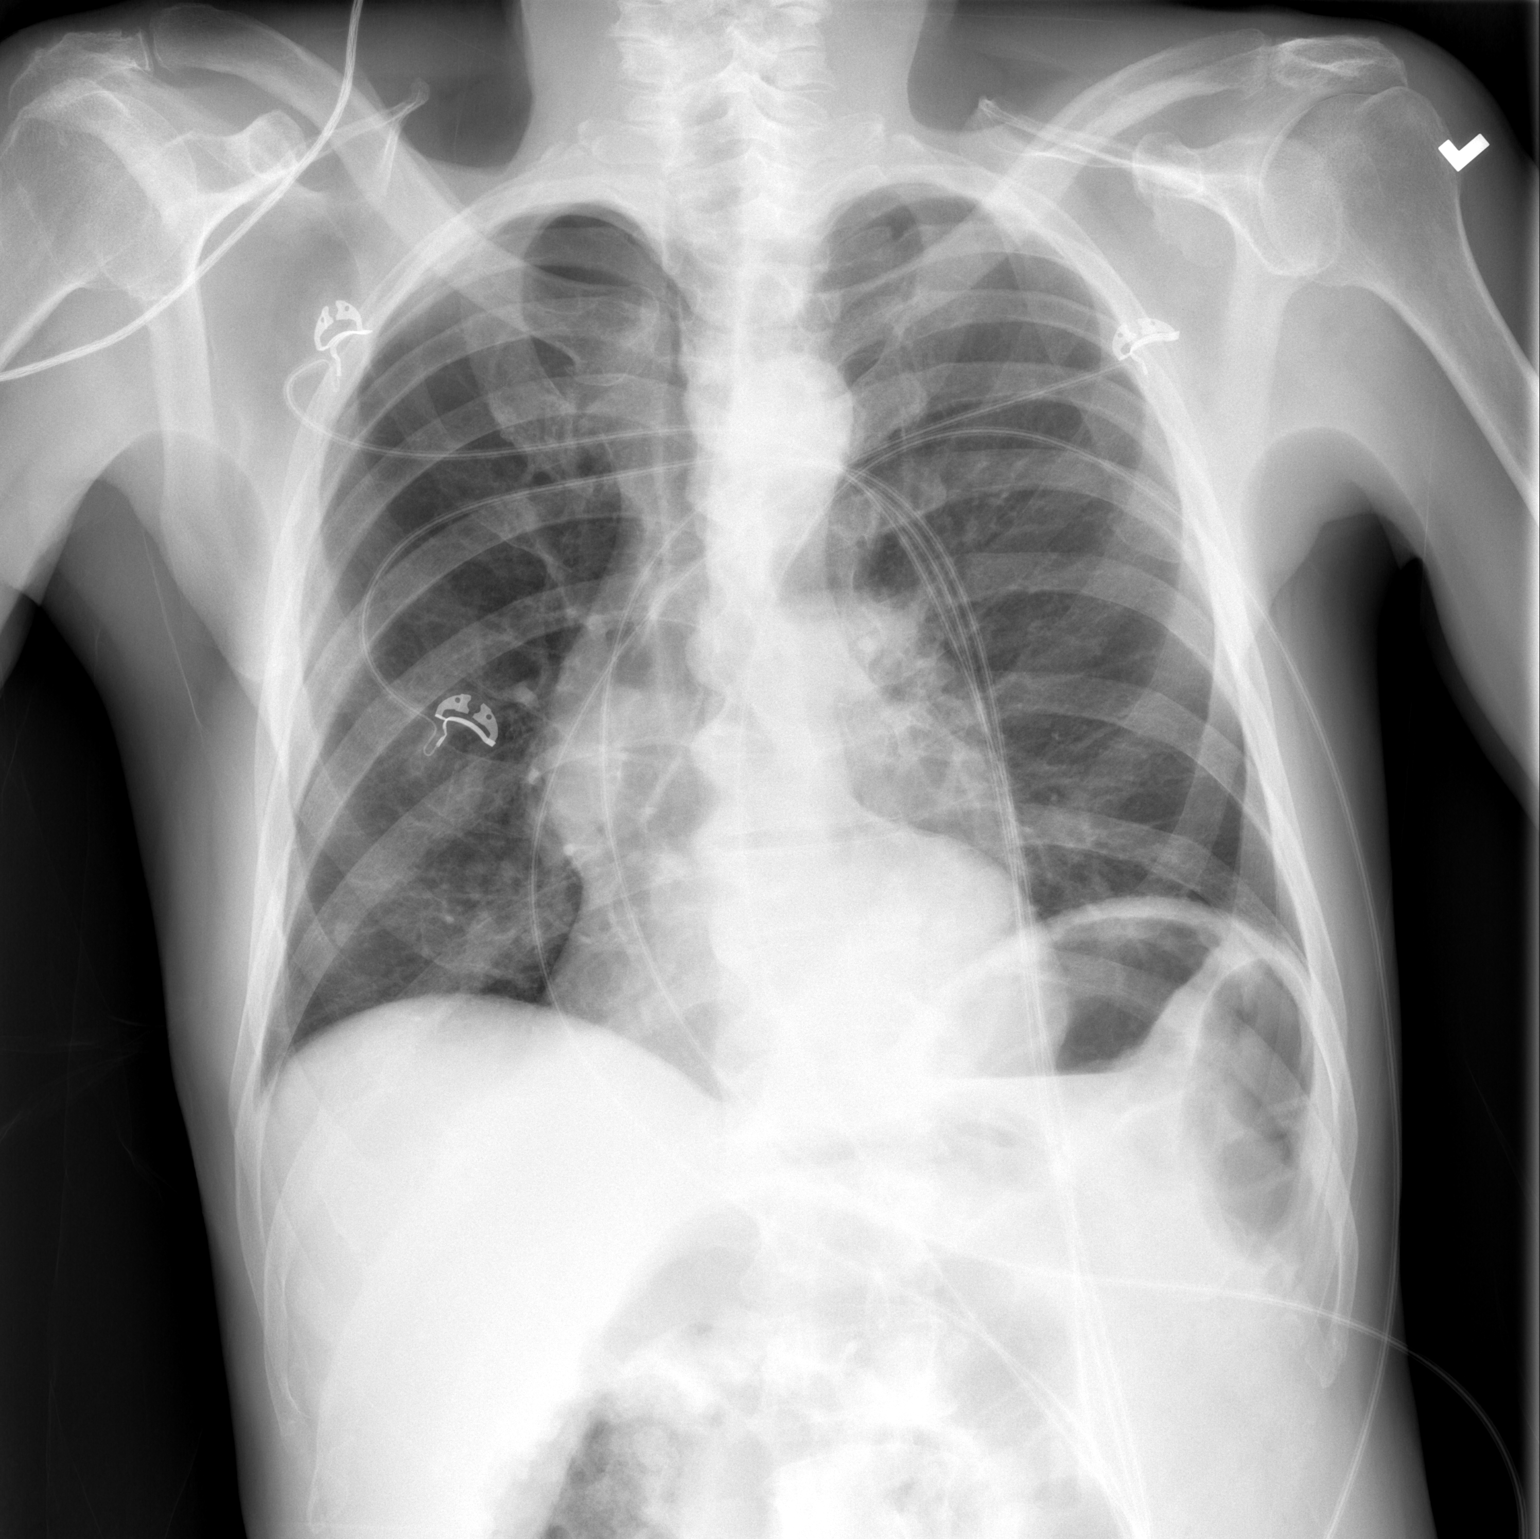

[w chest lat]
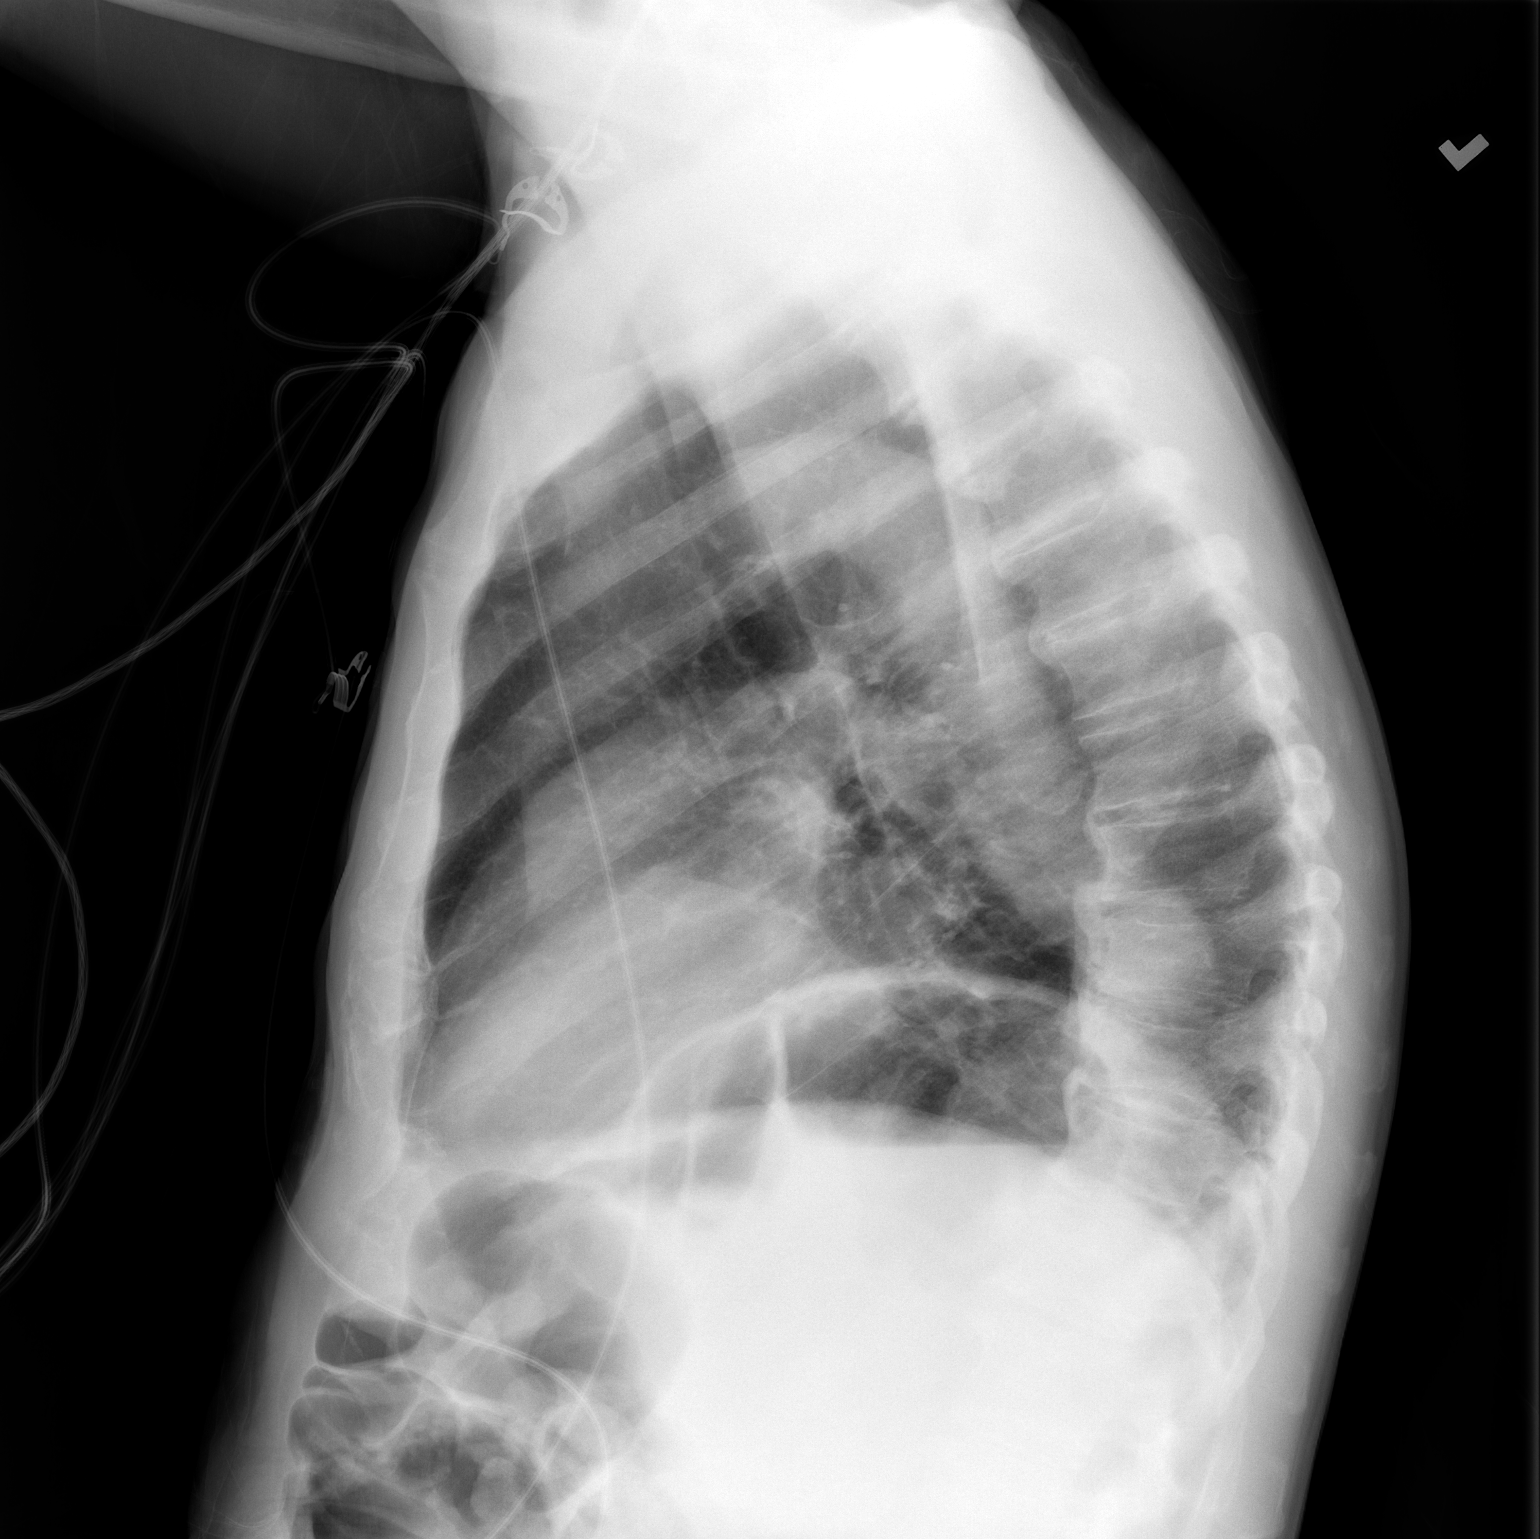

[2 of 2 positions shown; findings below may reference images not displayed]

FINDINGS: Lungs are clear.  No pleural effusion or pneumothorax.

The heart is normal in size.  Tortuous aorta.

Degenerative changes of the visualized thoracolumbar spine.
IMPRESSION: Normal chest radiographs.

## 2024-03-05 IMAGING — CT CT ABD-PELV W/ CM
2 of 5 series · 14 of 46 positions shown, 16 images · IV contrast (Omnipaque)
Comparison: 05/22/2021

CLINICAL DATA: Pancreatitis

EXAM:
CT ABDOMEN AND PELVIS WITH CONTRAST
TECHNIQUE: Multidetector CT imaging of the abdomen and pelvis was performed
using the standard protocol following bolus administration of
intravenous contrast.

[Series 2: axial st · axial · 0.75mm/px · z∈[-412,-28]mm · 11 of 87 slices shown, 13 images]
[im 5/87  soft-tissue]
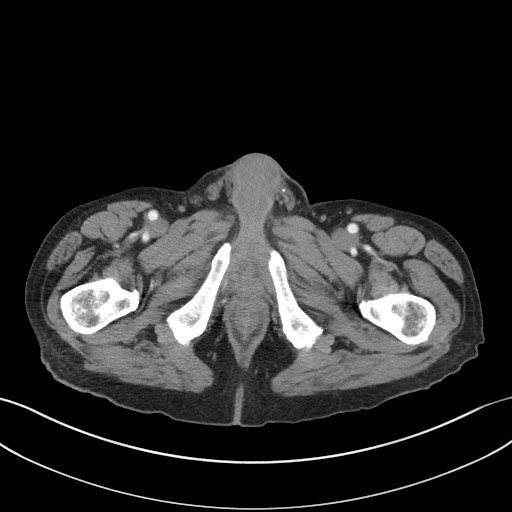
[im 5/87  bone]
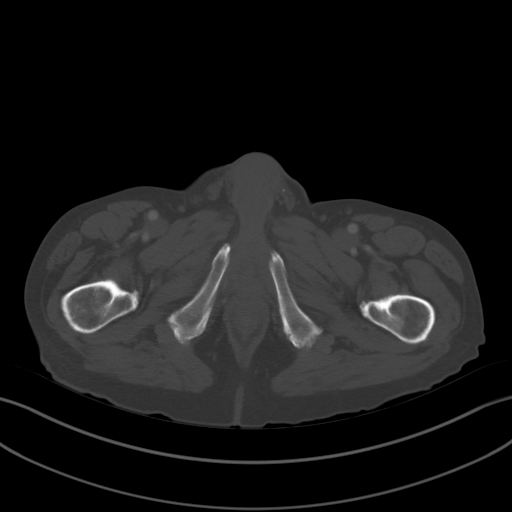
[im 13/87  soft-tissue]
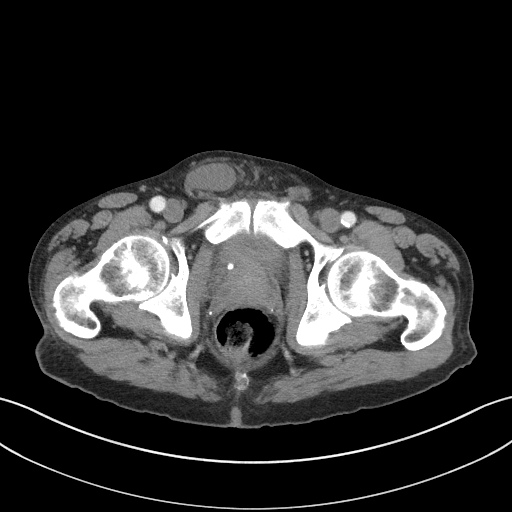
[im 22/87  soft-tissue]
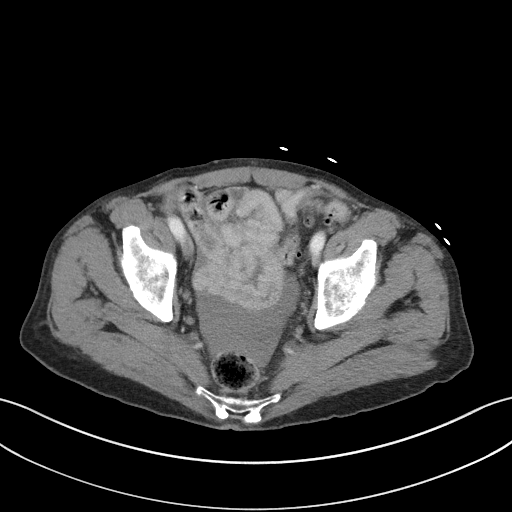
[im 31/87  soft-tissue]
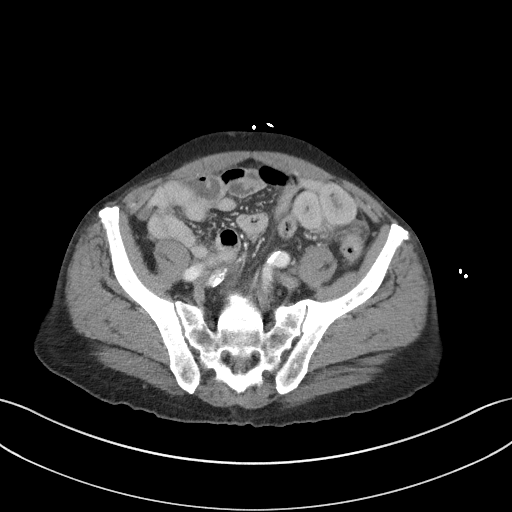
[im 35/87  soft-tissue]
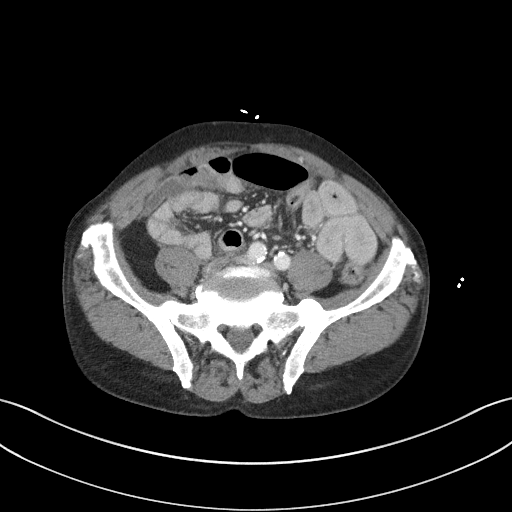
[im 44/87  soft-tissue]
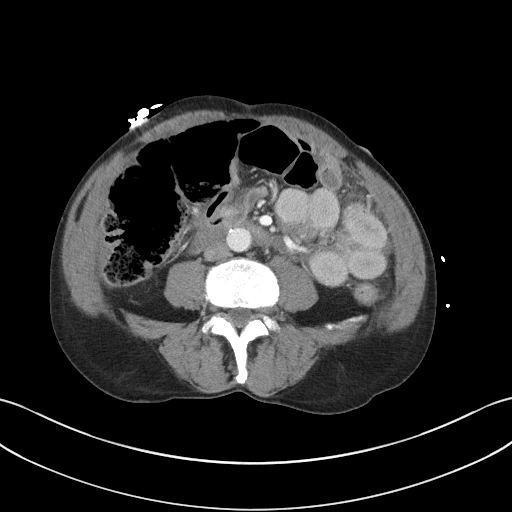
[im 52/87  soft-tissue]
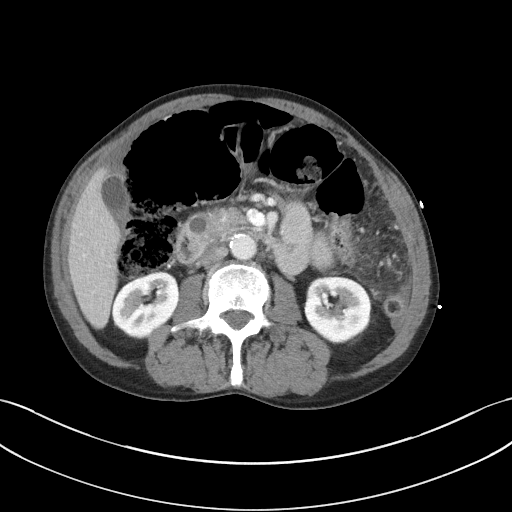
[im 56/87  soft-tissue]
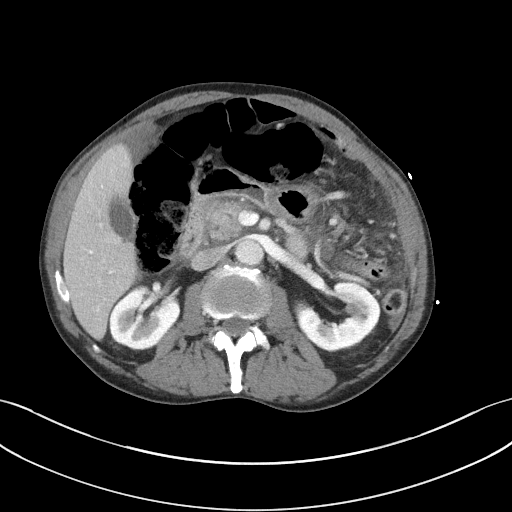
[im 65/87  soft-tissue]
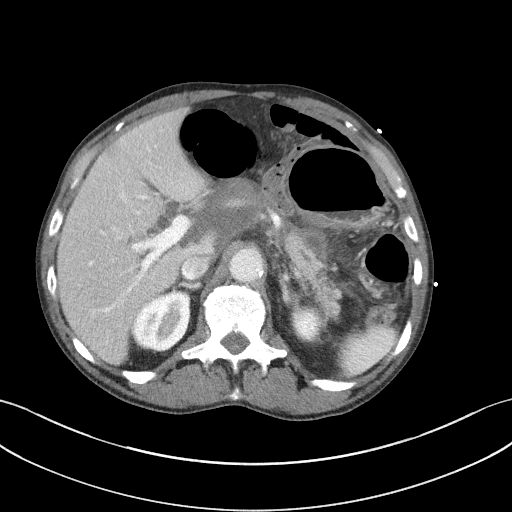
[im 65/87  bone]
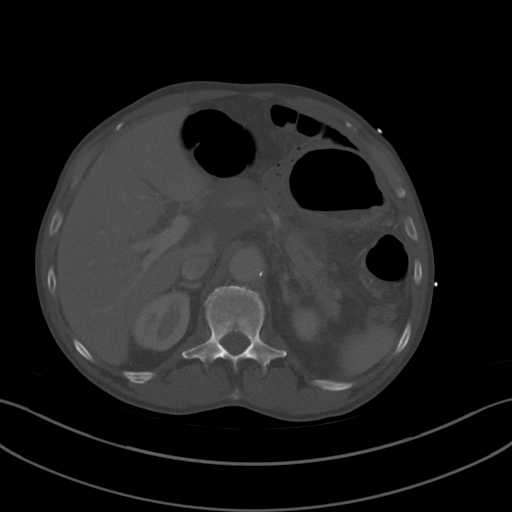
[im 74/87  soft-tissue]
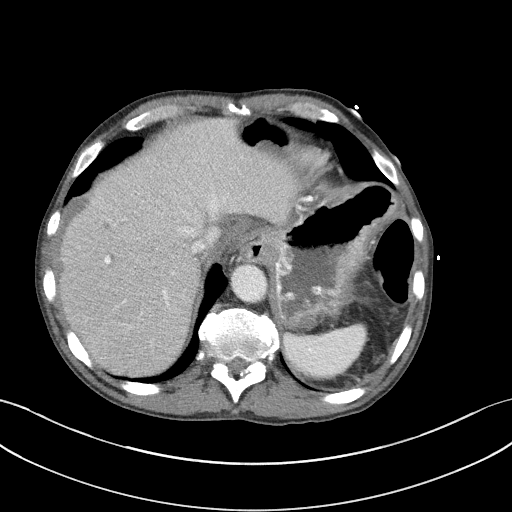
[im 82/87  soft-tissue]
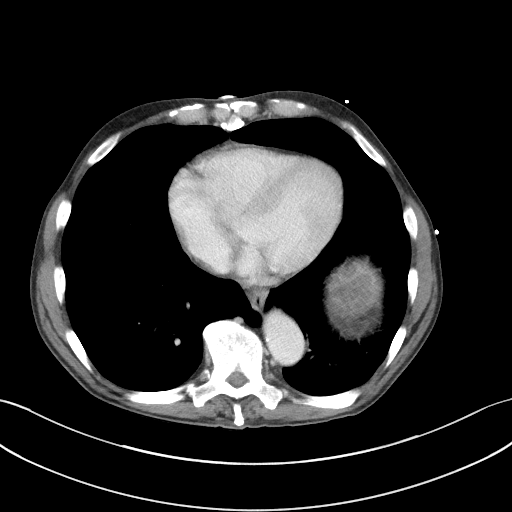

[Series 5: coronal st · coronal · 0.72mm/px · 3 of 101 slices shown]
[im 34/101  soft-tissue]
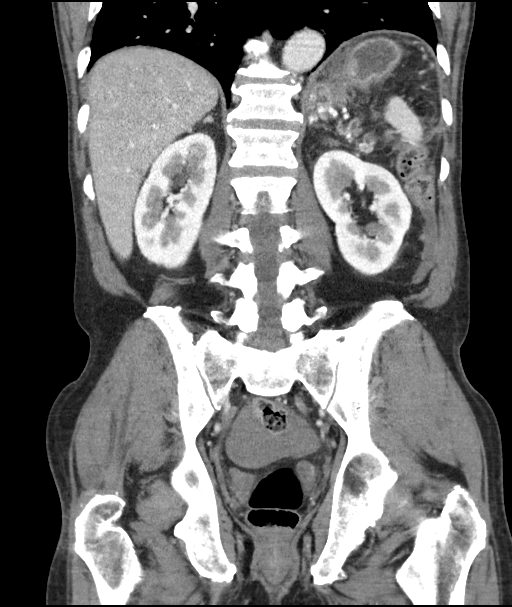
[im 45/101  soft-tissue]
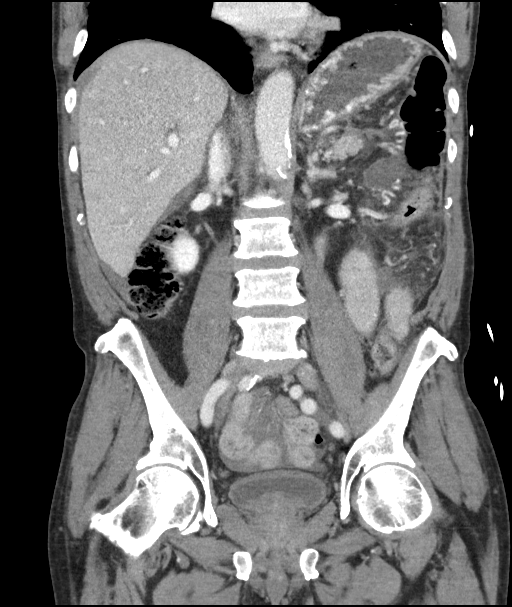
[im 56/101  soft-tissue]
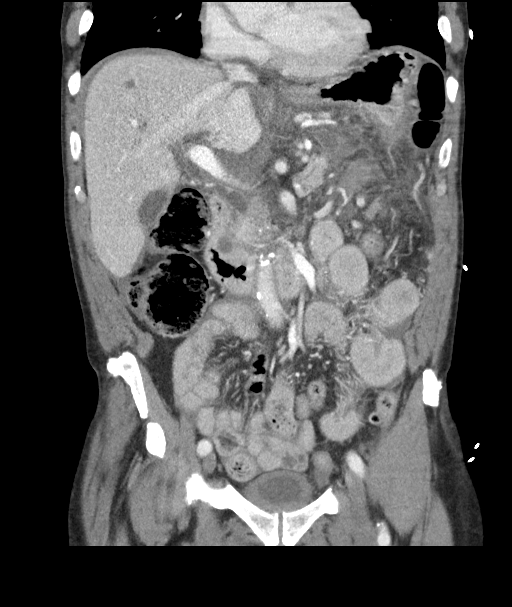

[14 of 46 positions shown; findings below may reference images not displayed]

RADIATION DOSE REDUCTION: This exam was performed according to the
departmental dose-optimization program which includes automated
exposure control, adjustment of the mA and/or kV according to
patient size and/or use of iterative reconstruction technique.

CONTRAST:  100mL OMNIPAQUE IOHEXOL 300 MG/ML  SOLN
FINDINGS: Lower chest: Unremarkable.

Hepatobiliary: Liver measures 16.5 cm in length. There is 7 mm fluid
density lesion in the right lobe with no significant change. There
is prominence of intrahepatic bile ducts. Distal common bile duct in
the head of the pancreas measures 10.6 mm. Gallbladder is not
distended. Small ascites is noted in the porta hepatis and along the
anterior margin of the liver.

Pancreas: There is marked edema around the pancreas. There is 1.4 cm
fluid density lesion in the head of the pancreas which measured 7 mm
in diameter in the previous study. There is mild dilation of
pancreatic duct. There is interval clearing of 2 cm loculated fluid
collection in the tail of the pancreas. There is no demonstrable
focal absence of vascular enhancement in the pancreas. There are no
new loculated fluid collections in the or around the pancreas.

Spleen: Unremarkable.

Adrenals/Urinary Tract: Adrenals are unremarkable. There is no
hydronephrosis. There are no renal or ureteral stones. There are few
low-density lesions in the renal cortex largest measuring 2.5 cm in
the upper pole of right kidney. There are no renal or ureteral
stones. Urinary bladder is not distended. There is mild diffuse wall
thickening in the bladder which may be partly due to incomplete
distention. Possibility of cystitis is not excluded.

Stomach/Bowel: Small hiatal hernia is seen. There is mucosal
enhancement in the stomach, possibly suggesting nonspecific
gastritis. There is no significant small bowel dilation. There is
mild wall thickening in the small bowel loops. Appendix is not
dilated. Cecum is higher than usual in the position appendix is
noted anterior to the duodenum. There is distention of cecum
ascending and transverse colon. Scattered diverticula are seen in
the left colon without signs of focal acute diverticulitis.

Vascular/Lymphatic: There are scattered atherosclerotic plaques and
calcifications in the aorta and its major branches.

Reproductive: Prostate is enlarged with inhomogeneous enhancement.

Other: There is no pneumoperitoneum. Small ascites is present. There
is stranding in the fat planes in the mesentery. Small umbilical
hernia containing fat is seen. Right inguinal hernia containing fat
and ascitic fluid is seen.

Musculoskeletal: Unremarkable.
IMPRESSION: There is marked edema around the pancreas suggesting acute
pancreatitis. There is extension of inflammatory stranding into the
mesentery. Small ascites is present, possibly related to
pancreatitis. There is no evidence of pancreatic necrosis.

There is interval clearing of 2 cm loculated fluid collection in the
tail of pancreas. There is 1.4 cm smooth marginated fluid density
lesion in the head of the pancreas which appears larger in size.
This may suggest a small pseudocyst. Less likely possibility would
be cystic neoplastic process. There is dilation of bile ducts with
distal common bile duct measuring 10.6 mm.

There is no hydronephrosis. There few bilateral renal cysts. Small
hiatal hernia. There is mucosal enhancement in the stomach, possibly
suggesting gastritis. There is wall thickening in the small bowel
loops, more so in the jejunum, possibly suggesting nonspecific
enteritis.

There is mild diffuse wall thickening in the urinary bladder which
may be due to incomplete distention or suggest cystitis. Prostate is
enlarged.

Other findings as described in the body of the report.
